# Patient Record
Sex: Female | Born: 1970 | Race: White | Hispanic: No | Marital: Married | Smoking: Current every day smoker
Health system: Southern US, Community
[De-identification: ages and names within clinical notes are randomized; demographics above are authoritative.]

## PROBLEM LIST (undated history)

## (undated) DIAGNOSIS — D649 Anemia, unspecified: Secondary | ICD-10-CM

## (undated) DIAGNOSIS — I2510 Atherosclerotic heart disease of native coronary artery without angina pectoris: Secondary | ICD-10-CM

## (undated) DIAGNOSIS — F419 Anxiety disorder, unspecified: Secondary | ICD-10-CM

## (undated) DIAGNOSIS — Z95810 Presence of automatic (implantable) cardiac defibrillator: Secondary | ICD-10-CM

## (undated) DIAGNOSIS — Z87442 Personal history of urinary calculi: Secondary | ICD-10-CM

## (undated) DIAGNOSIS — H269 Unspecified cataract: Secondary | ICD-10-CM

## (undated) DIAGNOSIS — I10 Essential (primary) hypertension: Secondary | ICD-10-CM

## (undated) DIAGNOSIS — M1990 Unspecified osteoarthritis, unspecified site: Secondary | ICD-10-CM

## (undated) DIAGNOSIS — R519 Headache, unspecified: Secondary | ICD-10-CM

## (undated) DIAGNOSIS — H35039 Hypertensive retinopathy, unspecified eye: Secondary | ICD-10-CM

## (undated) DIAGNOSIS — I509 Heart failure, unspecified: Secondary | ICD-10-CM

## (undated) DIAGNOSIS — E785 Hyperlipidemia, unspecified: Secondary | ICD-10-CM

## (undated) DIAGNOSIS — E11319 Type 2 diabetes mellitus with unspecified diabetic retinopathy without macular edema: Secondary | ICD-10-CM

## (undated) DIAGNOSIS — R55 Syncope and collapse: Secondary | ICD-10-CM

## (undated) DIAGNOSIS — J189 Pneumonia, unspecified organism: Secondary | ICD-10-CM

## (undated) DIAGNOSIS — H3323 Serous retinal detachment, bilateral: Secondary | ICD-10-CM

## (undated) DIAGNOSIS — Z720 Tobacco use: Secondary | ICD-10-CM

## (undated) DIAGNOSIS — I219 Acute myocardial infarction, unspecified: Secondary | ICD-10-CM

## (undated) DIAGNOSIS — E119 Type 2 diabetes mellitus without complications: Secondary | ICD-10-CM

## (undated) DIAGNOSIS — R42 Dizziness and giddiness: Secondary | ICD-10-CM

## (undated) HISTORY — DX: Syncope and collapse: R55

## (undated) HISTORY — DX: Unspecified cataract: H26.9

## (undated) HISTORY — DX: Dizziness and giddiness: R42

## (undated) HISTORY — PX: OTHER SURGICAL HISTORY: SHX169

## (undated) HISTORY — DX: Hypertensive retinopathy, unspecified eye: H35.039

## (undated) HISTORY — PX: TUBAL LIGATION: SHX77

## (undated) HISTORY — DX: Type 2 diabetes mellitus with unspecified diabetic retinopathy without macular edema: E11.319

## (undated) HISTORY — DX: Hyperlipidemia, unspecified: E78.5

## (undated) HISTORY — DX: Serous retinal detachment, bilateral: H33.23

---

## 2019-10-13 ENCOUNTER — Emergency Department (HOSPITAL_COMMUNITY): Payer: Self-pay

## 2019-10-13 ENCOUNTER — Encounter (HOSPITAL_COMMUNITY): Payer: Self-pay | Admitting: Emergency Medicine

## 2019-10-13 ENCOUNTER — Observation Stay (HOSPITAL_COMMUNITY): Payer: Self-pay

## 2019-10-13 ENCOUNTER — Inpatient Hospital Stay (HOSPITAL_COMMUNITY)
Admission: EM | Admit: 2019-10-13 | Discharge: 2019-10-15 | DRG: 287 | Disposition: A | Discharge status: Home / Self Care | Payer: Self-pay | Attending: Internal Medicine | Admitting: Internal Medicine

## 2019-10-13 ENCOUNTER — Other Ambulatory Visit: Payer: Self-pay

## 2019-10-13 DIAGNOSIS — I071 Rheumatic tricuspid insufficiency: Secondary | ICD-10-CM | POA: Diagnosis present

## 2019-10-13 DIAGNOSIS — I5021 Acute systolic (congestive) heart failure: Principal | ICD-10-CM | POA: Diagnosis present

## 2019-10-13 DIAGNOSIS — I255 Ischemic cardiomyopathy: Secondary | ICD-10-CM | POA: Diagnosis present

## 2019-10-13 DIAGNOSIS — I509 Heart failure, unspecified: Secondary | ICD-10-CM

## 2019-10-13 DIAGNOSIS — F419 Anxiety disorder, unspecified: Secondary | ICD-10-CM | POA: Diagnosis present

## 2019-10-13 DIAGNOSIS — I313 Pericardial effusion (noninflammatory): Secondary | ICD-10-CM | POA: Diagnosis present

## 2019-10-13 DIAGNOSIS — Z20822 Contact with and (suspected) exposure to covid-19: Secondary | ICD-10-CM | POA: Diagnosis present

## 2019-10-13 DIAGNOSIS — I252 Old myocardial infarction: Secondary | ICD-10-CM

## 2019-10-13 DIAGNOSIS — E669 Obesity, unspecified: Secondary | ICD-10-CM | POA: Diagnosis present

## 2019-10-13 DIAGNOSIS — I3139 Other pericardial effusion (noninflammatory): Secondary | ICD-10-CM | POA: Diagnosis present

## 2019-10-13 DIAGNOSIS — R0602 Shortness of breath: Secondary | ICD-10-CM

## 2019-10-13 DIAGNOSIS — E119 Type 2 diabetes mellitus without complications: Secondary | ICD-10-CM

## 2019-10-13 DIAGNOSIS — Z6836 Body mass index (BMI) 36.0-36.9, adult: Secondary | ICD-10-CM

## 2019-10-13 DIAGNOSIS — I428 Other cardiomyopathies: Secondary | ICD-10-CM | POA: Diagnosis present

## 2019-10-13 DIAGNOSIS — E875 Hyperkalemia: Secondary | ICD-10-CM | POA: Diagnosis present

## 2019-10-13 DIAGNOSIS — R188 Other ascites: Secondary | ICD-10-CM | POA: Diagnosis present

## 2019-10-13 DIAGNOSIS — I5082 Biventricular heart failure: Secondary | ICD-10-CM | POA: Diagnosis present

## 2019-10-13 DIAGNOSIS — I2510 Atherosclerotic heart disease of native coronary artery without angina pectoris: Secondary | ICD-10-CM | POA: Diagnosis present

## 2019-10-13 DIAGNOSIS — Z7984 Long term (current) use of oral hypoglycemic drugs: Secondary | ICD-10-CM

## 2019-10-13 DIAGNOSIS — Z8249 Family history of ischemic heart disease and other diseases of the circulatory system: Secondary | ICD-10-CM

## 2019-10-13 DIAGNOSIS — F17200 Nicotine dependence, unspecified, uncomplicated: Secondary | ICD-10-CM | POA: Diagnosis present

## 2019-10-13 DIAGNOSIS — I700 Atherosclerosis of aorta: Secondary | ICD-10-CM | POA: Diagnosis present

## 2019-10-13 DIAGNOSIS — R17 Unspecified jaundice: Secondary | ICD-10-CM | POA: Diagnosis present

## 2019-10-13 DIAGNOSIS — H539 Unspecified visual disturbance: Secondary | ICD-10-CM | POA: Diagnosis present

## 2019-10-13 DIAGNOSIS — R000 Tachycardia, unspecified: Secondary | ICD-10-CM | POA: Diagnosis present

## 2019-10-13 DIAGNOSIS — Q231 Congenital insufficiency of aortic valve: Secondary | ICD-10-CM

## 2019-10-13 DIAGNOSIS — F17210 Nicotine dependence, cigarettes, uncomplicated: Secondary | ICD-10-CM | POA: Diagnosis present

## 2019-10-13 DIAGNOSIS — E8770 Fluid overload, unspecified: Secondary | ICD-10-CM

## 2019-10-13 DIAGNOSIS — J40 Bronchitis, not specified as acute or chronic: Secondary | ICD-10-CM | POA: Diagnosis present

## 2019-10-13 DIAGNOSIS — R079 Chest pain, unspecified: Secondary | ICD-10-CM | POA: Diagnosis present

## 2019-10-13 HISTORY — DX: Tobacco use: Z72.0

## 2019-10-13 HISTORY — DX: Type 2 diabetes mellitus without complications: E11.9

## 2019-10-13 LAB — HEPATIC FUNCTION PANEL
ALT: 11 U/L (ref 0–44)
AST: 17 U/L (ref 15–41)
Albumin: 3.6 g/dL (ref 3.5–5.0)
Alkaline Phosphatase: 109 U/L (ref 38–126)
Bilirubin, Direct: 0.4 mg/dL — ABNORMAL HIGH (ref 0.0–0.2)
Indirect Bilirubin: 1.7 mg/dL — ABNORMAL HIGH (ref 0.3–0.9)
Total Bilirubin: 2.1 mg/dL — ABNORMAL HIGH (ref 0.3–1.2)
Total Protein: 6.1 g/dL — ABNORMAL LOW (ref 6.5–8.1)

## 2019-10-13 LAB — CBC
HCT: 45.6 % (ref 36.0–46.0)
Hemoglobin: 13 g/dL (ref 12.0–15.0)
MCH: 23.8 pg — ABNORMAL LOW (ref 26.0–34.0)
MCHC: 28.5 g/dL — ABNORMAL LOW (ref 30.0–36.0)
MCV: 83.5 fL (ref 80.0–100.0)
Platelets: 259 10*3/uL (ref 150–400)
RBC: 5.46 MIL/uL — ABNORMAL HIGH (ref 3.87–5.11)
RDW: 15.3 % (ref 11.5–15.5)
WBC: 8.3 10*3/uL (ref 4.0–10.5)
nRBC: 0 % (ref 0.0–0.2)

## 2019-10-13 LAB — BASIC METABOLIC PANEL
Anion gap: 11 (ref 5–15)
BUN: 6 mg/dL (ref 6–20)
CO2: 29 mmol/L (ref 22–32)
Calcium: 9 mg/dL (ref 8.9–10.3)
Chloride: 96 mmol/L — ABNORMAL LOW (ref 98–111)
Creatinine, Ser: 0.84 mg/dL (ref 0.44–1.00)
GFR calc Af Amer: >60 mL/min (ref >60)
GFR calc non Af Amer: >60 mL/min (ref >60)
Glucose, Bld: 209 mg/dL — ABNORMAL HIGH (ref 70–99)
Potassium: 3.5 mmol/L (ref 3.5–5.1)
Sodium: 136 mmol/L (ref 135–145)

## 2019-10-13 LAB — RESPIRATORY PANEL BY RT PCR (FLU A&B, COVID)
Influenza A by PCR: NEGATIVE
Influenza B by PCR: NEGATIVE
SARS Coronavirus 2 by RT PCR: NEGATIVE

## 2019-10-13 LAB — I-STAT BETA HCG BLOOD, ED (MC, WL, AP ONLY): I-stat hCG, quantitative: <5 m[IU]/mL (ref <5)

## 2019-10-13 LAB — BRAIN NATRIURETIC PEPTIDE: B Natriuretic Peptide: 1913.7 pg/mL — ABNORMAL HIGH (ref 0.0–100.0)

## 2019-10-13 LAB — TROPONIN I (HIGH SENSITIVITY)
Troponin I (High Sensitivity): 14 ng/L (ref <18)
Troponin I (High Sensitivity): 13 ng/L (ref <18)

## 2019-10-13 LAB — CBG MONITORING, ED: Glucose-Capillary: 141 mg/dL — ABNORMAL HIGH (ref 70–99)

## 2019-10-13 MED ORDER — SODIUM CHLORIDE 0.9 % IV SOLN: 250.0000 mL | INTRAVENOUS | Status: DC | PRN

## 2019-10-13 MED ORDER — FUROSEMIDE 10 MG/ML IJ SOLN
40.0000 mg | Freq: Two times a day (BID) | INTRAMUSCULAR | Status: DC
  Administered 2019-10-14: 40 mg via INTRAVENOUS
  Filled 2019-10-13: qty 4

## 2019-10-13 MED ORDER — INSULIN ASPART 100 UNIT/ML ~~LOC~~ SOLN: 0.0000 [IU] | Freq: Every day | SUBCUTANEOUS | Status: DC

## 2019-10-13 MED ORDER — ENOXAPARIN SODIUM 40 MG/0.4ML ~~LOC~~ SOLN
40.0000 mg | SUBCUTANEOUS | Status: DC
  Administered 2019-10-13: 40 mg via SUBCUTANEOUS
  Filled 2019-10-13: qty 0.4

## 2019-10-13 MED ORDER — INSULIN ASPART 100 UNIT/ML ~~LOC~~ SOLN
0.0000 [IU] | Freq: Three times a day (TID) | SUBCUTANEOUS | Status: DC
  Administered 2019-10-14 – 2019-10-15 (×3): 1 [IU] via SUBCUTANEOUS
  Administered 2019-10-15: 2 [IU] via SUBCUTANEOUS

## 2019-10-13 MED ORDER — ONDANSETRON HCL 4 MG/2ML IJ SOLN: 4.0000 mg | Freq: Four times a day (QID) | INTRAMUSCULAR | Status: DC | PRN

## 2019-10-13 MED ORDER — FUROSEMIDE 10 MG/ML IJ SOLN
40.0000 mg | Freq: Once | INTRAMUSCULAR | Status: AC
  Administered 2019-10-13: 40 mg via INTRAVENOUS
  Filled 2019-10-13: qty 4

## 2019-10-13 MED ORDER — ACETAMINOPHEN 325 MG PO TABS: 650.0000 mg | ORAL_TABLET | ORAL | Status: DC | PRN

## 2019-10-13 MED ORDER — SODIUM CHLORIDE 0.9% FLUSH: 3.0000 mL | INTRAVENOUS | Status: DC | PRN

## 2019-10-13 MED ORDER — SODIUM CHLORIDE 0.9% FLUSH
3.0000 mL | Freq: Two times a day (BID) | INTRAVENOUS | Status: DC
  Administered 2019-10-14: 3 mL via INTRAVENOUS

## 2019-10-13 MED ORDER — SODIUM CHLORIDE 0.9% FLUSH: 3.0000 mL | Freq: Once | INTRAVENOUS | Status: DC

## 2019-10-13 MED ORDER — IOHEXOL 350 MG/ML SOLN
100.0000 mL | Freq: Once | INTRAVENOUS | Status: AC | PRN
  Administered 2019-10-13: 100 mL via INTRAVENOUS

## 2019-10-13 NOTE — ED Provider Notes (Signed)
[facility] EMERGENCY DEPARTMENT Provider Note   CSN: [id number] Arrival date & time: 10/13/19  1512     History Chief Complaint  Patient presents with   Chest Pain   Shortness of Breath    Zanaria Morell is a 49 y.o. female.  The history is provided by the patient.  Shortness of Breath Severity:  Moderate Onset quality:  Gradual Timing:  Constant Progression:  Worsening Chronicity:  New Context: activity   Relieved by:  Nothing Worsened by:  Exertion Associated symptoms: chest pain and cough   Associated symptoms: no abdominal pain, no claudication, no diaphoresis, no ear pain, no fever, no headaches, no hemoptysis, no PND, no rash, no sore throat, no sputum production, no syncope, no swollen glands and no vomiting   Risk factors: no hx of PE/DVT        Past Medical History:  Diagnosis Date   Diabetes mellitus without complication (HCC)     There are no problems to display for this patient.   History reviewed. No pertinent surgical history.   OB History   No obstetric history on file.     No family history on file.  Social History   Tobacco Use   Smoking status: Current Every Day Smoker   Smokeless tobacco: Never Used  Substance Use Topics   Alcohol use: Yes   Drug use: Never    Home Medications Prior to Admission medications   Medication Sig Start Date End Date Taking? Authorizing Provider  metFORMIN (GLUCOPHAGE) 1000 MG tablet Take 1 tablet by mouth in the morning and at bedtime. 09/13/19  Yes [provider]    Allergies    Patient has no known allergies.  Review of Systems   Review of Systems  Constitutional: Negative for chills, diaphoresis and fever.  HENT: Negative for ear pain and sore throat.   Eyes: Negative for pain and visual disturbance.  Respiratory: Positive for cough and shortness of breath. Negative for hemoptysis and sputum production.   Cardiovascular: Positive for chest pain and leg  swelling. Negative for palpitations, claudication, syncope and PND.  Gastrointestinal: Negative for abdominal pain and vomiting.  Genitourinary: Negative for dysuria and hematuria.  Musculoskeletal: Negative for arthralgias and back pain.  Skin: Negative for color change and rash.  Neurological: Negative for seizures, syncope and headaches.  All other systems reviewed and are negative.   Physical Exam Updated Vital Signs  ED Triage Vitals  Enc Vitals Group     BP 10/13/19 1517 (!) 132/94     Pulse Rate 10/13/19 1517 (!) 102     Resp 10/13/19 1517 18     Temp 10/13/19 1517 97.7 F (36.5 C)     Temp Source 10/13/19 1517 Oral     SpO2 10/13/19 1517 100 %     Weight --      Height --      Head Circumference --      Peak Flow --      Pain Score 10/13/19 1523 4     Pain Loc --      Pain Edu? --      Excl. in GC? --     Physical Exam Vitals and nursing note reviewed.  Constitutional:      General: She is not in acute distress.    Appearance: She is well-developed. She is not ill-appearing.  HENT:     Head: Normocephalic and atraumatic.  Eyes:     Conjunctiva/sclera: Conjunctivae normal.  Pupils: Pupils are equal, round, and reactive to light.  Cardiovascular:     Rate and Rhythm: Normal rate and regular rhythm.     Pulses:          Radial pulses are 2+ on the right side and 2+ on the left side.     Heart sounds: Normal heart sounds. No murmur.  Pulmonary:     Effort: Pulmonary effort is normal. No respiratory distress.     Breath sounds: Decreased breath sounds present.  Abdominal:     Palpations: Abdomen is soft.     Tenderness: There is no abdominal tenderness.  Musculoskeletal:        General: Normal range of motion.     Cervical back: Normal range of motion and neck supple.     Right lower leg: Edema (3+ pitting) present.     Left lower leg: Edema (3+ pitting) present.  Skin:    General: Skin is warm and dry.     Capillary Refill: Capillary refill takes less  than 2 seconds.  Neurological:     General: No focal deficit present.     Mental Status: She is alert.  Psychiatric:        Mood and Affect: Mood normal.     ED Results / Procedures / Treatments   Labs (all labs ordered are listed, but only abnormal results are displayed) Labs Reviewed  BASIC METABOLIC PANEL - Abnormal; Notable for the following components:      Result Value   Chloride 96 (*)    Glucose, Bld 209 (*)    All other components within normal limits  CBC - Abnormal; Notable for the following components:   RBC 5.46 (*)    MCH 23.8 (*)    MCHC 28.5 (*)    All other components within normal limits  BRAIN NATRIURETIC PEPTIDE - Abnormal; Notable for the following components:   B Natriuretic Peptide 1,913.7 (*)    All other components within normal limits  HEPATIC FUNCTION PANEL - Abnormal; Notable for the following components:   Total Protein 6.1 (*)    Total Bilirubin 2.1 (*)    Bilirubin, Direct 0.4 (*)    Indirect Bilirubin 1.7 (*)    All other components within normal limits  RESPIRATORY PANEL BY RT PCR (FLU A&B, COVID)  I-STAT BETA HCG BLOOD, ED (MC, [facility], AP ONLY)  TROPONIN I (HIGH SENSITIVITY)  TROPONIN I (HIGH SENSITIVITY)    EKG EKG Interpretation  Date/Time:  Thursday Oct 13 2019 15:15:23 EDT Ventricular Rate:  101 PR Interval:  184 QRS Duration: 86 QT Interval:  330 QTC Calculation: 427 R Axis:   119 Text Interpretation: Sinus tachycardia Confirmed by Lennice Sites [phone]) on 10/13/2019 7:50:22 PM   Radiology DG Chest 2 View  Result Date: 10/13/2019 CLINICAL DATA:  Chest pain EXAM: CHEST - 2 VIEW COMPARISON:  08/07/2016 FINDINGS: Heart size is upper limits of normal. Mild interstitial prominence throughout both lungs without focal airspace consolidation, pleural effusion, or pneumothorax. The visualized skeletal structures are unremarkable. IMPRESSION: 1. Mild interstitial prominence throughout both lungs which may reflect edema versus atypical  infection. No focal airspace consolidation. 2. Heart size upper limits of normal. Electronically Signed   By: Davina Poke D.O.   On: 10/13/2019 15:40   CT Angio Chest PE W and/or Wo Contrast  Result Date: 10/13/2019 CLINICAL DATA:  Shortness of breath EXAM: CT ANGIOGRAPHY CHEST WITH CONTRAST TECHNIQUE: Multidetector CT imaging of the chest was performed using the standard protocol  during bolus administration of intravenous contrast. Multiplanar CT image reconstructions and MIPs were obtained to evaluate the vascular anatomy. CONTRAST:  OMNIPAQUE IOHEXOL 350 MG/ML SOLN COMPARISON:  03/03/2010 FINDINGS: Cardiovascular: Satisfactory opacification of the pulmonary arteries to the segmental level. No evidence of pulmonary embolism. Cardiomegaly. Scattered coronary artery calcifications. Trace pericardial effusion. Aortic atherosclerosis. Mediastinum/Nodes: Prominent nonspecific mediastinal and hilar lymph nodes. Thyroid gland, trachea, and esophagus demonstrate no significant findings. Lungs/Pleura: Diffuse bilateral bronchial wall thickening. No pleural effusion or pneumothorax. Upper Abdomen: No acute abnormality. Small volume ascites in the included upper abdomen. Musculoskeletal: No chest wall abnormality. No acute or significant osseous findings. Review of the MIP images confirms the above findings. IMPRESSION: 1. Negative examination for pulmonary embolism. 2. Diffuse bilateral bronchial wall thickening, consistent with nonspecific infectious or inflammatory bronchitis. 3. Prominent nonspecific mediastinal and hilar lymph nodes, likely reactive. 4. Cardiomegaly and trace pericardial effusion. 5. Scattered coronary artery calcifications. 6. Small volume ascites in the included upper abdomen. 7. Aortic Atherosclerosis (ICD10-I70.0). Electronically Signed   By: Lauralyn Primes M.D.   On: 10/13/2019 20:50    Procedures Procedures (including critical care time)  Medications Ordered in ED Medications    sodium chloride flush (NS) 0.9 % injection 3 mL (has no administration in time range)  iohexol (OMNIPAQUE) 350 MG/ML injection 100 mL (100 mLs Intravenous Contrast Given 10/13/19 2037)  furosemide (LASIX) injection 40 mg (40 mg Intravenous Given 10/13/19 2111)    ED Course  I have reviewed the triage vital signs and the nursing notes.  Pertinent labs & imaging results that were available during my care of the patient were reviewed by me and considered in my medical decision making (see chart for details).    MDM Rules/Calculators/A&P                      Daleyza Gadomski is a 49 year old female with history of diabetes who presents to the ED with shortness of breath, chest pain, leg swelling.  Patient with symptoms over the last several days to weeks.  Has noticed weight gain.  Shortness of breath when walking, shortness of breath with lying flat.  No history of heart failure.  No current chest pain.  Patient has 3+ pitting edema bilaterally in her legs.  X-ray appears likely more consistent with edema than infection.  Troponin negative x2.  No significant anemia, electrolyte abnormality, kidney injury.  Given her shortness of breath and chest pain we will get a CT scan to further evaluate for PE versus infection versus edema.  Will add BNP, hepatic function panel.  Anticipate admission for echocardiogram and diuresis as mostly concerned about new heart failure.  BNP is almost 2000.  CT of the chest negative for PE.  Patient does have diffuse bilateral bronchial wall thickening possibly inflammatory.  Patient does have cardiomegaly and trace pericardial effusion.  Does have scattered coronary artery calcifications.  Overall believe symptoms are secondary to new heart failure.  Seems less likely that she has an acute infectious process.  Likely has COPD.  Patient given IV Lasix and will admit for further diuresis and formal echocardiogram.  She may need ongoing ischemic work-up as well.  This chart was  dictated using voice recognition software.  Despite best efforts to proofread,  errors can occur which can change the documentation meaning.    Final Clinical Impression(s) / ED Diagnoses Final diagnoses:  SOB (shortness of breath)  Acute heart failure, unspecified heart failure type (HCC)  Hypervolemia, unspecified  hypervolemia type    Rx / DC Orders ED Discharge Orders    None       Virgina Norfolk, DO 10/13/19 2113

## 2019-10-13 NOTE — ED Triage Notes (Signed)
Pt c/o chest pain, described as squeezing, pain to the left shoulder and shortness of breath. Pt reports symptoms have been intermittent over a few days, but became constant today. Also c/o lower extremity swelling.

## 2019-10-13 NOTE — H&P (Addendum)
History and Physical    Shelly [name] KGY:[id number] DOB: [birth year] DOA: 10/13/2019  PCP: [facility]   Patient coming from: Home   Chief Complaint: Chest tightness, SOB, swelling   HPI: Shelly [name] is a 49 y.o. female with medical history significant for type 2 diabetes mellitus and tobacco abuse, now presenting to emergency department with shortness of breath, chest tightness, and swelling.  Patient began to notice that she was gaining weight roughly a month ago and over the past couple weeks has developed new bilateral lower extremity edema and progressive exertional dyspnea.  For the past several days, she has had orthopnea and intermittent chest tightness.  Dyspnea and chest discomfort became more constant today, prompting her presentation to the ED.  She has never had this before, denies fevers or chills, and denies cough.  She denies any alcohol use or any significant acetaminophen use.  Denies abdominal pain, vomiting, or diarrhea.  ED Course: Upon arrival to the ED, patient is found to be afebrile, saturating well on room air, slightly tachycardic, and with stable blood pressure.  EKG features sinus tachycardia with rate 101.  CTA chest is negative for PE but notable for diffuse bilateral bronchial wall thickening, cardiomegaly, and trace pericardial effusion.  Also noted on the CTA is small volume ascites in the upper abdomen.  Chemistry panel features a glucose of 209 and bilirubin 2.1.  CBC is unremarkable.  BNP is elevated to 1914 and high-sensitivity troponin is normal x2.  COVID-19 screening test is pending.  Patient was given 40 mg IV Lasix in the ED, is beginning to diurese, and hospitalist consulted for admission.  Review of Systems:  All other systems reviewed and apart from HPI, are negative.  Past Medical History:  Diagnosis Date  . Diabetes mellitus without complication (HCC)     History reviewed. No pertinent surgical history.   reports that she has  been smoking. She has never used smokeless tobacco. She reports current alcohol use. She reports that she does not use drugs.  No Known Allergies  History reviewed. No pertinent family history.   Prior to Admission medications   Medication Sig Start Date End Date Taking? Authorizing Provider  metFORMIN (GLUCOPHAGE) 1000 MG tablet Take 1 tablet by mouth in the morning and at bedtime. 09/13/19  Yes [provider]    Physical Exam: Vitals:   10/13/19 2015 10/13/19 2155 10/13/19 2200 10/13/19 2217  BP: 137/90 134/87 126/88   Pulse: 92 94 95   Resp: (!) 27 17 (!) 22   Temp:      TempSrc:      SpO2: 99% 99% 93%   Weight:    108 kg  Height:    5\' 8"  (1.727 m)    Constitutional: NAD, calm  Eyes: PERTLA, lids and conjunctivae normal ENMT: Mucous membranes are moist. Posterior pharynx clear of any exudate or lesions.   Neck: normal, supple, no masses, no thyromegaly Respiratory:  no wheezing, no crackles. No accessory muscle use.  Cardiovascular: S1 & S2 heard, regular rate and rhythm. Pitting pretibial edema bilaterally. Abdomen: No distension, no tenderness, soft. Bowel sounds active.  Musculoskeletal: no clubbing / cyanosis. No joint deformity upper and lower extremities.   Skin: no significant rashes, lesions, ulcers. Warm, dry, well-perfused. Neurologic: CN 2-12 grossly intact. Sensation intact. Moving all extremities.  Psychiatric: Alert and oriented to person, place, and situation. Pleasant and cooperative.    Labs and Imaging on Admission: I have personally reviewed following labs and  imaging studies  CBC: Recent Labs  Lab 10/13/19 1527  WBC 8.3  HGB 13.0  HCT 45.6  MCV 83.5  PLT 259   Basic Metabolic Panel: Recent Labs  Lab 10/13/19 1527  NA 136  K 3.5  CL 96*  CO2 29  GLUCOSE 209*  BUN 6  CREATININE 0.84  CALCIUM 9.0   GFR: Estimated Creatinine Clearance: 103.1 mL/min (by C-G formula based on SCr of 0.84 mg/dL). Liver Function Tests: Recent  Labs  Lab 10/13/19 1527  AST 17  ALT 11  ALKPHOS 109  BILITOT 2.1*  PROT 6.1*  ALBUMIN 3.6   No results for input(s): LIPASE, AMYLASE in the last 168 hours. No results for input(s): AMMONIA in the last 168 hours. Coagulation Profile: No results for input(s): INR, PROTIME in the last 168 hours. Cardiac Enzymes: No results for input(s): CKTOTAL, CKMB, CKMBINDEX, TROPONINI in the last 168 hours. BNP (last 3 results) No results for input(s): PROBNP in the last 8760 hours. HbA1C: No results for input(s): HGBA1C in the last 72 hours. CBG: Recent Labs  Lab 10/13/19 2151  GLUCAP 141*   Lipid Profile: No results for input(s): CHOL, HDL, LDLCALC, TRIG, CHOLHDL, LDLDIRECT in the last 72 hours. Thyroid Function Tests: No results for input(s): TSH, T4TOTAL, FREET4, T3FREE, THYROIDAB in the last 72 hours. Anemia Panel: No results for input(s): VITAMINB12, FOLATE, FERRITIN, TIBC, IRON, RETICCTPCT in the last 72 hours. Urine analysis: No results found for: COLORURINE, APPEARANCEUR, LABSPEC, PHURINE, GLUCOSEU, HGBUR, BILIRUBINUR, KETONESUR, PROTEINUR, UROBILINOGEN, NITRITE, LEUKOCYTESUR Sepsis Labs: @LABRCNTIP (procalcitonin:4,lacticidven:4) ) Recent Results (from the past 240 hour(s))  Respiratory Panel by RT PCR (Flu A&B, Covid) - Nasopharyngeal Swab     Status: None   Collection Time: 10/13/19  8:09 PM   Specimen: Nasopharyngeal Swab  Result Value Ref Range Status   SARS Coronavirus 2 by RT PCR NEGATIVE NEGATIVE Final    Comment: (NOTE) SARS-CoV-2 target nucleic acids are NOT DETECTED. The SARS-CoV-2 RNA is generally detectable in upper respiratoy specimens during the acute phase of infection. The lowest concentration of SARS-CoV-2 viral copies this assay can detect is 131 copies/mL. A negative result does not preclude SARS-Cov-2 infection and should not be used as the sole basis for treatment or other patient management decisions. A negative result may occur with  improper  specimen collection/handling, submission of specimen other than nasopharyngeal swab, presence of viral mutation(s) within the areas targeted by this assay, and inadequate number of viral copies (<131 copies/mL). A negative result must be combined with clinical observations, patient history, and epidemiological information. The expected result is Negative. Fact Sheet for Patients:  12/13/19 Fact Sheet for Healthcare Providers:  https://www.moore.com/ This test is not yet ap proved or cleared by the https://www.young.biz/ FDA and  has been authorized for detection and/or diagnosis of SARS-CoV-2 by FDA under an Emergency Use Authorization (EUA). This EUA will remain  in effect (meaning this test can be used) for the duration of the COVID-19 declaration under Section 564(b)(1) of the Act, 21 U.S.C. section 360bbb-3(b)(1), unless the authorization is terminated or revoked sooner.    Influenza A by PCR NEGATIVE NEGATIVE Final   Influenza B by PCR NEGATIVE NEGATIVE Final    Comment: (NOTE) The Xpert Xpress SARS-CoV-2/FLU/RSV assay is intended as an aid in  the diagnosis of influenza from Nasopharyngeal swab specimens and  should not be used as a sole basis for treatment. Nasal washings and  aspirates are unacceptable for Xpert Xpress SARS-CoV-2/FLU/RSV  testing. Fact Sheet for Patients:  https://www.moore.com/ Fact Sheet for Healthcare Providers: https://www.young.biz/ This test is not yet approved or cleared by the [place] FDA and  has been authorized for detection and/or diagnosis of SARS-CoV-2 by  FDA under an Emergency Use Authorization (EUA). This EUA will remain  in effect (meaning this test can be used) for the duration of the  Covid-19 declaration under Section 564(b)(1) of the Act, 21  U.S.C. section 360bbb-3(b)(1), unless the authorization is  terminated or revoked. Performed at [facility], [address], [place], [place] [address]      Radiological Exams on Admission: DG Chest 2 View  Result Date: 10/13/2019 CLINICAL DATA:  Chest pain EXAM: CHEST - 2 VIEW COMPARISON:  08/07/2016 FINDINGS: Heart size is upper limits of normal. Mild interstitial prominence throughout both lungs without focal airspace consolidation, pleural effusion, or pneumothorax. The visualized skeletal structures are unremarkable. IMPRESSION: 1. Mild interstitial prominence throughout both lungs which may reflect edema versus atypical infection. No focal airspace consolidation. 2. Heart size upper limits of normal. Electronically Signed   By: Duanne Guess D.O.   On: 10/13/2019 15:40   CT Angio Chest PE W and/or Wo Contrast  Result Date: 10/13/2019 CLINICAL DATA:  Shortness of breath EXAM: CT ANGIOGRAPHY CHEST WITH CONTRAST TECHNIQUE: Multidetector CT imaging of the chest was performed using the standard protocol during bolus administration of intravenous contrast. Multiplanar CT image reconstructions and MIPs were obtained to evaluate the vascular anatomy. CONTRAST:  OMNIPAQUE IOHEXOL 350 MG/ML SOLN COMPARISON:  03/03/2010 FINDINGS: Cardiovascular: Satisfactory opacification of the pulmonary arteries to the segmental level. No evidence of pulmonary embolism. Cardiomegaly. Scattered coronary artery calcifications. Trace pericardial effusion. Aortic atherosclerosis. Mediastinum/Nodes: Prominent nonspecific mediastinal and hilar lymph nodes. Thyroid gland, trachea, and esophagus demonstrate no significant findings. Lungs/Pleura: Diffuse bilateral bronchial wall thickening. No pleural effusion or pneumothorax. Upper Abdomen: No acute abnormality. Small volume ascites in the included upper abdomen. Musculoskeletal: No chest wall abnormality. No acute or significant osseous findings. Review of the MIP images confirms the above findings. IMPRESSION: 1. Negative examination for pulmonary embolism. 2. Diffuse  bilateral bronchial wall thickening, consistent with nonspecific infectious or inflammatory bronchitis. 3. Prominent nonspecific mediastinal and hilar lymph nodes, likely reactive. 4. Cardiomegaly and trace pericardial effusion. 5. Scattered coronary artery calcifications. 6. Small volume ascites in the included upper abdomen. 7. Aortic Atherosclerosis (ICD10-I70.0). Electronically Signed   By: Lauralyn Primes M.D.   On: 10/13/2019 20:50    EKG: Independently reviewed. Sinus tachycardia, rate 101.   Assessment/Plan   1. Acute CHF  - Presents with progressive bilateral leg swelling, DOE, orthopnea, and chest discomfort and is found to have BNP 1914  - She was given Lasix 40 mg IV in ED and is beginning to diurese  - Continue diuresis with Lasix 40 mg IV q12h, check echocardiogram, monitor renal function and electrolytes with diuresis   2. Chest pain  - PE ruled-out with CTA and HS troponin normal x2  - Likely related to #1, continue cardiac monitoring and check echocardiogram    3. Hyperbilirubinemia; ascites  - Total bilirubin is 2.1 and abdominal ascites noted incidentally on CTA chest in ED  - Abdominal exam benign; she does not consume alcohol and denies significant APAP use  - Check abdominal US   4. Type II DM  - A1c was 9.2% in April 2021  - Hold metformin and use low-intensity SSI for now   5. Current smoker  - She is actively cutting back with goal of  quitting, was encouraged to continue these efforts    DVT prophylaxis: Lovenox  Code Status: Full  Family Communication: Discussed with patient  Disposition Plan:  Patient is from: Home  Anticipated d/c is to: Home  Anticipated d/c date is: 10/14/19 Patient currently: Pending abdominal US, echocardiogram, and improved respiratory status with diuresis  Consults called: None  Admission status: Observation     Vianne Bulls, MD [facility] Pager: See [email or web address]  If 7AM-7PM, please contact the daytime  attending [email or web address]  10/13/2019, 10:19 PM

## 2019-10-14 ENCOUNTER — Observation Stay (HOSPITAL_COMMUNITY): Payer: Self-pay

## 2019-10-14 ENCOUNTER — Encounter (HOSPITAL_COMMUNITY): Payer: Self-pay | Admitting: Internal Medicine

## 2019-10-14 ENCOUNTER — Encounter (HOSPITAL_COMMUNITY)
Admission: EM | Disposition: A | Discharge status: Home / Self Care | Payer: Self-pay | Source: Home / Self Care | Attending: Internal Medicine

## 2019-10-14 DIAGNOSIS — I3139 Other pericardial effusion (noninflammatory): Secondary | ICD-10-CM | POA: Diagnosis present

## 2019-10-14 DIAGNOSIS — E669 Obesity, unspecified: Secondary | ICD-10-CM | POA: Diagnosis present

## 2019-10-14 DIAGNOSIS — I313 Pericardial effusion (noninflammatory): Secondary | ICD-10-CM | POA: Diagnosis present

## 2019-10-14 DIAGNOSIS — E119 Type 2 diabetes mellitus without complications: Secondary | ICD-10-CM

## 2019-10-14 DIAGNOSIS — I340 Nonrheumatic mitral (valve) insufficiency: Secondary | ICD-10-CM

## 2019-10-14 DIAGNOSIS — I2510 Atherosclerotic heart disease of native coronary artery without angina pectoris: Secondary | ICD-10-CM

## 2019-10-14 DIAGNOSIS — F17200 Nicotine dependence, unspecified, uncomplicated: Secondary | ICD-10-CM

## 2019-10-14 DIAGNOSIS — R079 Chest pain, unspecified: Secondary | ICD-10-CM

## 2019-10-14 DIAGNOSIS — I361 Nonrheumatic tricuspid (valve) insufficiency: Secondary | ICD-10-CM

## 2019-10-14 DIAGNOSIS — Z6835 Body mass index (BMI) 35.0-35.9, adult: Secondary | ICD-10-CM

## 2019-10-14 DIAGNOSIS — I5021 Acute systolic (congestive) heart failure: Principal | ICD-10-CM

## 2019-10-14 DIAGNOSIS — R188 Other ascites: Secondary | ICD-10-CM | POA: Diagnosis present

## 2019-10-14 DIAGNOSIS — I509 Heart failure, unspecified: Secondary | ICD-10-CM

## 2019-10-14 HISTORY — PX: RIGHT/LEFT HEART CATH AND CORONARY ANGIOGRAPHY: CATH118266

## 2019-10-14 LAB — POCT I-STAT EG7
Acid-Base Excess: 8 mmol/L — ABNORMAL HIGH (ref 0.0–2.0)
Acid-Base Excess: 9 mmol/L — ABNORMAL HIGH (ref 0.0–2.0)
Bicarbonate: 33.7 mmol/L — ABNORMAL HIGH (ref 20.0–28.0)
Bicarbonate: 35.3 mmol/L — ABNORMAL HIGH (ref 20.0–28.0)
Calcium, Ion: 1.03 mmol/L — ABNORMAL LOW (ref 1.15–1.40)
Calcium, Ion: 1.13 mmol/L — ABNORMAL LOW (ref 1.15–1.40)
HCT: 40 % (ref 36.0–46.0)
HCT: 42 % (ref 36.0–46.0)
Hemoglobin: 13.6 g/dL (ref 12.0–15.0)
Hemoglobin: 14.3 g/dL (ref 12.0–15.0)
O2 Saturation: 61 %
O2 Saturation: 62 %
Potassium: 2.9 mmol/L — ABNORMAL LOW (ref 3.5–5.1)
Potassium: 3.1 mmol/L — ABNORMAL LOW (ref 3.5–5.1)
Sodium: 141 mmol/L (ref 135–145)
Sodium: 143 mmol/L (ref 135–145)
TCO2: 35 mmol/L — ABNORMAL HIGH (ref 22–32)
TCO2: 37 mmol/L — ABNORMAL HIGH (ref 22–32)
pCO2, Ven: 50.6 mmHg (ref 44.0–60.0)
pCO2, Ven: 51.9 mmHg (ref 44.0–60.0)
pH, Ven: 7.432 — ABNORMAL HIGH (ref 7.250–7.430)
pH, Ven: 7.441 — ABNORMAL HIGH (ref 7.250–7.430)
pO2, Ven: 31 mmHg — CL (ref 32.0–45.0)
pO2, Ven: 32 mmHg (ref 32.0–45.0)

## 2019-10-14 LAB — COMPREHENSIVE METABOLIC PANEL
ALT: 11 U/L (ref 0–44)
AST: 14 U/L — ABNORMAL LOW (ref 15–41)
Albumin: 3.4 g/dL — ABNORMAL LOW (ref 3.5–5.0)
Alkaline Phosphatase: 105 U/L (ref 38–126)
Anion gap: 15 (ref 5–15)
BUN: 6 mg/dL (ref 6–20)
CO2: 29 mmol/L (ref 22–32)
Calcium: 9.3 mg/dL (ref 8.9–10.3)
Chloride: 99 mmol/L (ref 98–111)
Creatinine, Ser: 0.82 mg/dL (ref 0.44–1.00)
GFR calc Af Amer: >60 mL/min (ref >60)
GFR calc non Af Amer: >60 mL/min (ref >60)
Glucose, Bld: 153 mg/dL — ABNORMAL HIGH (ref 70–99)
Potassium: 3.7 mmol/L (ref 3.5–5.1)
Sodium: 143 mmol/L (ref 135–145)
Total Bilirubin: 2.6 mg/dL — ABNORMAL HIGH (ref 0.3–1.2)
Total Protein: 6.1 g/dL — ABNORMAL LOW (ref 6.5–8.1)

## 2019-10-14 LAB — POCT I-STAT 7, (LYTES, BLD GAS, ICA,H+H)
Acid-Base Excess: 5 mmol/L — ABNORMAL HIGH (ref 0.0–2.0)
Bicarbonate: 28.1 mmol/L — ABNORMAL HIGH (ref 20.0–28.0)
Calcium, Ion: 0.78 mmol/L — CL (ref 1.15–1.40)
HCT: 35 % — ABNORMAL LOW (ref 36.0–46.0)
Hemoglobin: 11.9 g/dL — ABNORMAL LOW (ref 12.0–15.0)
O2 Saturation: 99 %
Potassium: 2.4 mmol/L — CL (ref 3.5–5.1)
Sodium: 148 mmol/L — ABNORMAL HIGH (ref 135–145)
TCO2: 29 mmol/L (ref 22–32)
pCO2 arterial: 36.1 mmHg (ref 32.0–48.0)
pH, Arterial: 7.499 — ABNORMAL HIGH (ref 7.350–7.450)
pO2, Arterial: 141 mmHg — ABNORMAL HIGH (ref 83.0–108.0)

## 2019-10-14 LAB — CBC WITH DIFFERENTIAL/PLATELET
Abs Immature Granulocytes: 0.02 10*3/uL (ref 0.00–0.07)
Basophils Absolute: 0.1 10*3/uL (ref 0.0–0.1)
Basophils Relative: 1 %
Eosinophils Absolute: 0.2 10*3/uL (ref 0.0–0.5)
Eosinophils Relative: 2 %
HCT: 44.5 % (ref 36.0–46.0)
Hemoglobin: 13 g/dL (ref 12.0–15.0)
Immature Granulocytes: 0 %
Lymphocytes Relative: 30 %
Lymphs Abs: 2.2 10*3/uL (ref 0.7–4.0)
MCH: 24.2 pg — ABNORMAL LOW (ref 26.0–34.0)
MCHC: 29.2 g/dL — ABNORMAL LOW (ref 30.0–36.0)
MCV: 82.7 fL (ref 80.0–100.0)
Monocytes Absolute: 0.5 10*3/uL (ref 0.1–1.0)
Monocytes Relative: 7 %
Neutro Abs: 4.5 10*3/uL (ref 1.7–7.7)
Neutrophils Relative %: 60 %
Platelets: 242 10*3/uL (ref 150–400)
RBC: 5.38 MIL/uL — ABNORMAL HIGH (ref 3.87–5.11)
RDW: 15.4 % (ref 11.5–15.5)
WBC: 7.5 10*3/uL (ref 4.0–10.5)
nRBC: 0 % (ref 0.0–0.2)

## 2019-10-14 LAB — GLUCOSE, CAPILLARY
Glucose-Capillary: 149 mg/dL — ABNORMAL HIGH (ref 70–99)
Glucose-Capillary: 121 mg/dL — ABNORMAL HIGH (ref 70–99)
Glucose-Capillary: 137 mg/dL — ABNORMAL HIGH (ref 70–99)
Glucose-Capillary: 142 mg/dL — ABNORMAL HIGH (ref 70–99)

## 2019-10-14 LAB — CBC
HCT: 42.6 % (ref 36.0–46.0)
Hemoglobin: 12.6 g/dL (ref 12.0–15.0)
MCH: 24.4 pg — ABNORMAL LOW (ref 26.0–34.0)
MCHC: 29.6 g/dL — ABNORMAL LOW (ref 30.0–36.0)
MCV: 82.6 fL (ref 80.0–100.0)
Platelets: 251 10*3/uL (ref 150–400)
RBC: 5.16 MIL/uL — ABNORMAL HIGH (ref 3.87–5.11)
RDW: 15.2 % (ref 11.5–15.5)
WBC: 7.8 10*3/uL (ref 4.0–10.5)
nRBC: 0 % (ref 0.0–0.2)

## 2019-10-14 LAB — CREATININE, SERUM
Creatinine, Ser: 0.92 mg/dL (ref 0.44–1.00)
GFR calc Af Amer: >60 mL/min (ref >60)
GFR calc non Af Amer: >60 mL/min (ref >60)

## 2019-10-14 LAB — ECHOCARDIOGRAM COMPLETE
Height: 68 in
Weight: 3752 oz

## 2019-10-14 LAB — LIPID PANEL
Cholesterol: 153 mg/dL (ref 0–200)
HDL: 26 mg/dL — ABNORMAL LOW (ref >40)
LDL Cholesterol: 109 mg/dL — ABNORMAL HIGH (ref 0–99)
Total CHOL/HDL Ratio: 5.9 RATIO
Triglycerides: 91 mg/dL (ref <150)
VLDL: 18 mg/dL (ref 0–40)

## 2019-10-14 LAB — HIV ANTIBODY (ROUTINE TESTING W REFLEX): HIV Screen 4th Generation wRfx: NONREACTIVE

## 2019-10-14 SURGERY — RIGHT/LEFT HEART CATH AND CORONARY ANGIOGRAPHY
Anesthesia: LOCAL

## 2019-10-14 MED ORDER — ALPRAZOLAM 0.5 MG PO TABS: 0.5000 mg | ORAL_TABLET | Freq: Once | ORAL | Status: DC

## 2019-10-14 MED ORDER — MIDAZOLAM HCL 2 MG/2ML IJ SOLN
INTRAMUSCULAR | Status: AC
  Filled 2019-10-14: qty 2

## 2019-10-14 MED ORDER — ASPIRIN 81 MG PO CHEW: 81.0000 mg | CHEWABLE_TABLET | ORAL | Status: DC

## 2019-10-14 MED ORDER — DIGOXIN 125 MCG PO TABS
0.1250 mg | ORAL_TABLET | Freq: Every day | ORAL | Status: DC
  Administered 2019-10-14 – 2019-10-15 (×2): 0.125 mg via ORAL
  Filled 2019-10-14 (×2): qty 1

## 2019-10-14 MED ORDER — VERAPAMIL HCL 2.5 MG/ML IV SOLN
INTRAVENOUS | Status: AC
  Filled 2019-10-14: qty 2

## 2019-10-14 MED ORDER — FENTANYL CITRATE (PF) 100 MCG/2ML IJ SOLN
INTRAMUSCULAR | Status: DC | PRN
  Administered 2019-10-14: 25 ug via INTRAVENOUS

## 2019-10-14 MED ORDER — ONDANSETRON HCL 4 MG/2ML IJ SOLN: 4.0000 mg | Freq: Four times a day (QID) | INTRAMUSCULAR | Status: DC | PRN

## 2019-10-14 MED ORDER — SODIUM CHLORIDE 0.9% FLUSH
3.0000 mL | Freq: Two times a day (BID) | INTRAVENOUS | Status: DC
  Administered 2019-10-14 – 2019-10-15 (×2): 3 mL via INTRAVENOUS

## 2019-10-14 MED ORDER — HEPARIN (PORCINE) IN NACL 1000-0.9 UT/500ML-% IV SOLN
INTRAVENOUS | Status: AC
  Filled 2019-10-14: qty 1000

## 2019-10-14 MED ORDER — SODIUM CHLORIDE 0.9 % IV SOLN: INTRAVENOUS | Status: DC

## 2019-10-14 MED ORDER — IOHEXOL 350 MG/ML SOLN
INTRAVENOUS | Status: DC | PRN
  Administered 2019-10-14: 70 mL

## 2019-10-14 MED ORDER — SODIUM CHLORIDE 0.9 % IV SOLN
INTRAVENOUS | Status: AC | PRN
  Administered 2019-10-14: 10 mL/h via INTRAVENOUS

## 2019-10-14 MED ORDER — HYDRALAZINE HCL 20 MG/ML IJ SOLN: 10.0000 mg | INTRAMUSCULAR | Status: AC | PRN

## 2019-10-14 MED ORDER — FENTANYL CITRATE (PF) 100 MCG/2ML IJ SOLN
INTRAMUSCULAR | Status: AC
  Filled 2019-10-14: qty 2

## 2019-10-14 MED ORDER — SPIRONOLACTONE 12.5 MG HALF TABLET
12.5000 mg | ORAL_TABLET | Freq: Every day | ORAL | Status: DC
  Administered 2019-10-14 – 2019-10-15 (×2): 12.5 mg via ORAL
  Filled 2019-10-14 (×2): qty 1

## 2019-10-14 MED ORDER — HEPARIN (PORCINE) IN NACL 1000-0.9 UT/500ML-% IV SOLN
INTRAVENOUS | Status: DC | PRN
  Administered 2019-10-14 (×2): 500 mL

## 2019-10-14 MED ORDER — SODIUM CHLORIDE 0.9% FLUSH: 3.0000 mL | INTRAVENOUS | Status: DC | PRN

## 2019-10-14 MED ORDER — LOSARTAN POTASSIUM 25 MG PO TABS
25.0000 mg | ORAL_TABLET | Freq: Every day | ORAL | Status: DC
  Administered 2019-10-14 – 2019-10-15 (×2): 25 mg via ORAL
  Filled 2019-10-14 (×2): qty 1

## 2019-10-14 MED ORDER — VERAPAMIL HCL 2.5 MG/ML IV SOLN
INTRAVENOUS | Status: DC | PRN
  Administered 2019-10-14: 10 mL via INTRA_ARTERIAL

## 2019-10-14 MED ORDER — LIDOCAINE HCL (PF) 1 % IJ SOLN
INTRAMUSCULAR | Status: AC
  Filled 2019-10-14: qty 30

## 2019-10-14 MED ORDER — SODIUM CHLORIDE 0.9 % IV SOLN: 250.0000 mL | INTRAVENOUS | Status: DC | PRN

## 2019-10-14 MED ORDER — LABETALOL HCL 5 MG/ML IV SOLN: 10.0000 mg | INTRAVENOUS | Status: AC | PRN

## 2019-10-14 MED ORDER — ENOXAPARIN SODIUM 40 MG/0.4ML ~~LOC~~ SOLN
40.0000 mg | SUBCUTANEOUS | Status: DC
  Administered 2019-10-15: 40 mg via SUBCUTANEOUS
  Filled 2019-10-14: qty 0.4

## 2019-10-14 MED ORDER — LIDOCAINE HCL (PF) 1 % IJ SOLN
INTRAMUSCULAR | Status: DC | PRN
  Administered 2019-10-14 (×2): 2 mL

## 2019-10-14 MED ORDER — HEPARIN SODIUM (PORCINE) 1000 UNIT/ML IJ SOLN
INTRAMUSCULAR | Status: DC | PRN
  Administered 2019-10-14: 5000 [IU] via INTRAVENOUS

## 2019-10-14 MED ORDER — HEPARIN SODIUM (PORCINE) 1000 UNIT/ML IJ SOLN
INTRAMUSCULAR | Status: AC
  Filled 2019-10-14: qty 1

## 2019-10-14 MED ORDER — ACETAMINOPHEN 325 MG PO TABS: 650.0000 mg | ORAL_TABLET | ORAL | Status: DC | PRN

## 2019-10-14 MED ORDER — ASPIRIN 81 MG PO CHEW
81.0000 mg | CHEWABLE_TABLET | Freq: Every day | ORAL | Status: DC
  Administered 2019-10-14 – 2019-10-15 (×2): 81 mg via ORAL
  Filled 2019-10-14 (×2): qty 1

## 2019-10-14 MED ORDER — POTASSIUM CHLORIDE CRYS ER 20 MEQ PO TBCR
40.0000 meq | EXTENDED_RELEASE_TABLET | Freq: Two times a day (BID) | ORAL | Status: DC
  Administered 2019-10-14: 40 meq via ORAL
  Administered 2019-10-15: 20 meq via ORAL
  Filled 2019-10-14 (×2): qty 2

## 2019-10-14 MED ORDER — SODIUM CHLORIDE 0.9% FLUSH: 3.0000 mL | Freq: Two times a day (BID) | INTRAVENOUS | Status: DC

## 2019-10-14 MED ORDER — FUROSEMIDE 10 MG/ML IJ SOLN
80.0000 mg | Freq: Two times a day (BID) | INTRAMUSCULAR | Status: DC
  Administered 2019-10-14 – 2019-10-15 (×2): 80 mg via INTRAVENOUS
  Filled 2019-10-14 (×2): qty 8

## 2019-10-14 MED ORDER — MIDAZOLAM HCL 2 MG/2ML IJ SOLN
INTRAMUSCULAR | Status: DC | PRN
  Administered 2019-10-14: 1 mg via INTRAVENOUS

## 2019-10-14 SURGICAL SUPPLY — 13 items
CATH 5FR JL3.5 JR4 ANG PIG MP (CATHETERS) ×1 IMPLANT
CATH BALLN WEDGE 5F 110CM (CATHETERS) ×1 IMPLANT
DEVICE RAD COMP TR BAND LRG (VASCULAR PRODUCTS) ×1 IMPLANT
GLIDESHEATH SLEND SS 6F .021 (SHEATH) ×1 IMPLANT
GUIDEWIRE INQWIRE 1.5J.035X260 (WIRE) IMPLANT
INQWIRE 1.5J .035X260CM (WIRE) ×2
KIT HEART LEFT (KITS) ×1 IMPLANT
PACK CARDIAC CATHETERIZATION (CUSTOM PROCEDURE TRAY) ×2 IMPLANT
SHEATH GLIDE SLENDER 4/5FR (SHEATH) ×1 IMPLANT
SHEATH PROBE COVER 6X72 (BAG) ×1 IMPLANT
TRANSDUCER W/STOPCOCK (MISCELLANEOUS) ×2 IMPLANT
TUBING CIL FLEX 10 FLL-RA (TUBING) ×1 IMPLANT
WIRE EMERALD 3MM-J .025X260CM (WIRE) ×1 IMPLANT

## 2019-10-14 NOTE — Progress Notes (Signed)
  Echocardiogram 2D Echocardiogram has been performed.  Delcie Roch 10/14/2019, 11:41 AM

## 2019-10-14 NOTE — Care Management (Signed)
Consented to assist with prescriptions. Patient going for heart cath today. Will continue to follow. Ronny Flurry

## 2019-10-14 NOTE — Consult Note (Addendum)
Cardiology Consultation:   Patient ID: Shelly [name] MRN: [id number]; DOB: [birth year]  Admit date: 10/13/2019 Date of Consult: 10/14/2019  Primary Care Provider: [facility] Cardiologist:New   Patient Profile:   Shelly [name] is a 49 y.o. female with a hx of diabetes mellitus and ongoing tobacco smoking who is being seen today for the evaluation of chest pain and shortness of breath at the request of Dr. Myna Hidalgo.   Prior cardiac history or stroke.  Strong family history of CAD.  Mother died suddenly from some sort of cardiomyopathy at age 45.  Father had MI at age 66 requiring stenting.  Both grandparents from father side has history of CAD.  Patient never had echocardiogram.  History of Present Illness:   Shelly [name] presented for evaluation of worsening shortness of breath and chest tightness.  Patient reports at least 70-months history of progressive worsening bilateral lower extremity edema and early satiety.  Patient had some sort of retinal hemorrhage (not due to diabetes) which has been resolved.  This was noted due to blurred vision.  For the past 3 to 4 weeks her shortness of breath with activity has got worsened.  Then she started to having substernal chest tightness at with intermittent radiation to her back.  Sometimes associated with palpitation.  Initially she felt it was due to anxiety.  Her symptoms were intermittent and resolved with rest within 10 minutes.  She reports worsening chest tightness and shortness of breath with bending.  No cough, dizziness, syncope or melena.  Recently seen by PCP and plan was to get an echocardiogram prior to starting diuretics but due to worsening symptoms she came to ER for further evaluation.  BNP 1913.  High-sensitivity troponin 14>>13.  Respiratory panel negative for influenza and Covid.  Chest x-ray showed pulmonary edema versus infection.  CT angio of chest negative for pulmonary embolism however showed nonspecific  infectious or inflammatory bronchitis.  Cardiomegaly with trace pericardial effusion.  Scattered coronary calcification.  Small volume ascites in upper abdomen.  Abdominal ultrasound showed small free fluid in the upper abdomen with features concerning for cirrhosis.  She was started on IV Lasix.  Diurese 1.6 L.  Pending reading of echocardiogram.  Patient denies alcohol abuse or illicit drug use.  She smokes 1 pack a day, used to smoke 2-1/2 pack/day.  No regular exercise.  Takes care of grandchildren and works from home.   Past Medical History:  Diagnosis Date   Diabetes mellitus without complication (HCC)    Tobacco abuse     Inpatient Medications: Scheduled Meds:  aspirin  81 mg Oral Daily   enoxaparin (LOVENOX) injection  40 mg Subcutaneous Q24H   furosemide  40 mg Intravenous Q12H   insulin aspart  0-5 Units Subcutaneous QHS   insulin aspart  0-9 Units Subcutaneous TID WC   sodium chloride flush  3 mL Intravenous Once   sodium chloride flush  3 mL Intravenous Q12H   Continuous Infusions:  sodium chloride     PRN Meds: sodium chloride, acetaminophen, ondansetron (ZOFRAN) IV, sodium chloride flush  Allergies:   No Known Allergies  Social History:   Social History   Socioeconomic History   Marital status: Married    Spouse name: Not on file   Number of children: Not on file   Years of education: Not on file   Highest education level: Not on file  Occupational History   Not on file  Tobacco Use   Smoking status: Current Every  Day Smoker   Smokeless tobacco: Never Used  Substance and Sexual Activity   Alcohol use: Yes   Drug use: Never   Sexual activity: Not on file  Other Topics Concern   Not on file  Social History Narrative   Not on file   Social Determinants of Health   Financial Resource Strain:    Difficulty of Paying Living Expenses:   Food Insecurity:    Worried About Programme researcher, broadcasting/film/video in the Last Year:    Barista  in the Last Year:   Transportation Needs:    Freight forwarder (Medical):    Lack of Transportation (Non-Medical):   Physical Activity:    Days of Exercise per Week:    Minutes of Exercise per Session:   Stress:    Feeling of Stress :   Social Connections:    Frequency of Communication with Friends and Family:    Frequency of Social Gatherings with Friends and Family:    Attends Religious Services:    Active Member of Clubs or Organizations:    Attends Engineer, structural:    Marital Status:   Intimate Partner Violence:    Fear of Current or Ex-Partner:    Emotionally Abused:    Physically Abused:    Sexually Abused:     Family History:   Family History  Problem Relation Age of Onset   Sudden Cardiac Death Mother    CAD Father    CAD Paternal Grandmother    CAD Paternal Grandfather      ROS:  Please see the history of present illness.  All other ROS reviewed and negative.     Physical Exam/Data:   Vitals:   10/13/19 2217 10/13/19 2334 10/14/19 0547 10/14/19 1143  BP:  127/78 112/77 117/81  Pulse:  92 89 87  Resp:   20 18  Temp:  97.6 F (36.4 C) (!) 97.5 F (36.4 C) (!) 97.4 F (36.3 C)  TempSrc:  Oral Oral Oral  SpO2:  96% 95% 98%  Weight: 108 kg 110.1 kg 106.4 kg   Height: 5\' 8"  (1.727 m)       Intake/Output Summary (Last 24 hours) at 10/14/2019 1244 Last data filed at 10/14/2019 1211 Gross per 24 hour  Intake 720 ml  Output 2400 ml  Net -1680 ml   Last 3 Weights 10/14/2019 10/13/2019 10/13/2019  Weight (lbs) 234 lb 8 oz 242 lb 11.6 oz 238 lb  Weight (kg) 106.369 kg 110.1 kg 107.956 kg     Body mass index is 35.66 kg/m.  General:  Well nourished, well developed, in no acute distress HEENT: normal Lymph: no adenopathy Neck: + JVD Endocrine:  No thryomegaly Vascular: No carotid bruits; FA pulses 2+ bilaterally without bruits  Cardiac:  normal S1, S2; RRR; no murmur  Lungs:  clear to auscultation bilaterally, no wheezing,  rhonchi or rales  Abd: soft, nontender, no hepatomegaly  Ext: no edema Musculoskeletal:  No deformities, BUE and BLE strength normal and equal Skin: warm and dry  Neuro:  CNs 2-12 intact, no focal abnormalities noted Psych:  Normal affect   EKG:  The EKG was personally reviewed and demonstrates:  Sinus tachycardia at 101 bpm Telemetry:  Telemetry was personally reviewed and demonstrates:  NSR   Relevant CV Studies: Pending echo reading   Laboratory Data:  High Sensitivity Troponin:   Recent Labs  Lab 10/13/19 1527 10/13/19 1800  TROPONINIHS 14 13     Chemistry Recent Labs  Lab 10/13/19 1527 10/14/19 0540  NA 136 143  K 3.5 3.7  CL 96* 99  CO2 29 29  GLUCOSE 209* 153*  BUN 6 6  CREATININE 0.84 0.82  CALCIUM 9.0 9.3  GFRNONAA >60 >60  GFRAA >60 >60  ANIONGAP 11 15    Recent Labs  Lab 10/13/19 1527 10/14/19 0540  PROT 6.1* 6.1*  ALBUMIN 3.6 3.4*  AST 17 14*  ALT 11 11  ALKPHOS 109 105  BILITOT 2.1* 2.6*   Hematology Recent Labs  Lab 10/13/19 1527 10/14/19 0540  WBC 8.3 7.5  RBC 5.46* 5.38*  HGB 13.0 13.0  HCT 45.6 44.5  MCV 83.5 82.7  MCH 23.8* 24.2*  MCHC 28.5* 29.2*  RDW 15.3 15.4  PLT 259 242   BNP Recent Labs  Lab 10/13/19 1958  BNP 1,913.7*    DDimer No results for input(s): DDIMER in the last 168 hours.   Radiology/Studies:  DG Chest 2 View  Result Date: 10/13/2019 CLINICAL DATA:  Chest pain EXAM: CHEST - 2 VIEW COMPARISON:  08/07/2016 FINDINGS: Heart size is upper limits of normal. Mild interstitial prominence throughout both lungs without focal airspace consolidation, pleural effusion, or pneumothorax. The visualized skeletal structures are unremarkable. IMPRESSION: 1. Mild interstitial prominence throughout both lungs which may reflect edema versus atypical infection. No focal airspace consolidation. 2. Heart size upper limits of normal. Electronically Signed   By: Duanne Guess D.O.   On: 10/13/2019 15:40   CT Angio Chest PE W  and/or Wo Contrast  Result Date: 10/13/2019 CLINICAL DATA:  Shortness of breath EXAM: CT ANGIOGRAPHY CHEST WITH CONTRAST TECHNIQUE: Multidetector CT imaging of the chest was performed using the standard protocol during bolus administration of intravenous contrast. Multiplanar CT image reconstructions and MIPs were obtained to evaluate the vascular anatomy. CONTRAST:  OMNIPAQUE IOHEXOL 350 MG/ML SOLN COMPARISON:  03/03/2010 FINDINGS: Cardiovascular: Satisfactory opacification of the pulmonary arteries to the segmental level. No evidence of pulmonary embolism. Cardiomegaly. Scattered coronary artery calcifications. Trace pericardial effusion. Aortic atherosclerosis. Mediastinum/Nodes: Prominent nonspecific mediastinal and hilar lymph nodes. Thyroid gland, trachea, and esophagus demonstrate no significant findings. Lungs/Pleura: Diffuse bilateral bronchial wall thickening. No pleural effusion or pneumothorax. Upper Abdomen: No acute abnormality. Small volume ascites in the included upper abdomen. Musculoskeletal: No chest wall abnormality. No acute or significant osseous findings. Review of the MIP images confirms the above findings. IMPRESSION: 1. Negative examination for pulmonary embolism. 2. Diffuse bilateral bronchial wall thickening, consistent with nonspecific infectious or inflammatory bronchitis. 3. Prominent nonspecific mediastinal and hilar lymph nodes, likely reactive. 4. Cardiomegaly and trace pericardial effusion. 5. Scattered coronary artery calcifications. 6. Small volume ascites in the included upper abdomen. 7. Aortic Atherosclerosis (ICD10-I70.0). Electronically Signed   By: Lauralyn Primes M.D.   On: 10/13/2019 20:50   US Abdomen Limited RUQ  Result Date: 10/13/2019 CLINICAL DATA:  Hyperbilirubinemia. EXAM: ULTRASOUND ABDOMEN LIMITED RIGHT UPPER QUADRANT COMPARISON:  None recent FINDINGS: Gallbladder: Surgically absent Common bile duct: Diameter: 7 mm Liver: The liver parenchyma is coarsened  and heterogeneous. There is suggestion of a subtle nodular contour. There is no evidence for discrete hepatic mass. Portal vein is patent on color Doppler imaging with normal direction of blood flow towards the liver. Other: There is a small volume of abdominal ascites. IMPRESSION: 1. No acute cholecystectomy. 2. Findings suspicious for underlying cirrhosis. 3. Small volume free fluid in the upper abdomen. Electronically Signed   By: Katherine Mantle M.D.   On: 10/13/2019 22:58   :[id number]} HEAR  Score (for undifferentiated chest pain):  HEAR Score: 4    Assessment and Plan:   1. Acute congestive heart failure Patient with at least 5 months history of lower extremity edema and dyspnea on exertion.  Recently started to having chest tightness with intermittent shortness of breath and palpitation.  Chest tightness worsened with bending.  She has early satiety. -BNP ~1900.  Chest x-ray edema versus atypical infection.  CT angio of chest negative for pulmonary embolism however cardiomegaly with trace pericardial effusion.  Small volume ascites. -Exam consistent with volume overload -Patient reports baseline weight of 210 pounds couple of months ago, here 238 pounds -Continue IV diuresis -Strict INO and daily weight -Echocardiogram has been done, pending reading>> preliminary review by myself look like BiV failure>> L & R cath on Monday   2.  Chest tightness -Rule out of ACS.  High-sensitivity troponin negative x2.  Significant risk factor including obesity, diabetes mellitus and strong family history of CAD. -Likely she will need ischemic evaluation this admission  3.  Diabetes mellitus -Per primary team  4.  Tobacco abuse -Strongly recommended cessation -Education given   Dr. Eldridge Dace to see.    For questions or updates, please contact [facility] Please consult [email or web address] for contact info under     Signed, Manson Passey, PA  10/14/2019 12:44 PM   I have examined the  patient and reviewed assessment and plan and discussed with patient.  Agree with above as stated.    I personally reviewed the echo images.  She has severe biventricular heart failure.  She will need a right and left heart catheterization along with continued diuresis.  Once her heart cath is done, will likely add Entresto and beta-blocker once she is more compensated.  We wanted to do the heart catheterization on Monday.  This was recommended but she was very adamant that she needs to go home.  She is stressed out that she does not have insurance.  She does not feel like she can stay in the hospital for 2 more days without severe anxiety.  I again stated my recommendation that she try to get IV Lasix over the weekend to help with the weight gain and fluid overload, followed by her heart cath on Monday.  Again, she insisted that she go home.  She does assure me that she will come back for test.  She realizes this is a serious problem but just does not feel that mentally, she can stay in the hospital.  We will try to arrange for outpatient catheterization for her next week.  Plan Lasix 40 mg twice daily over the weekend.  She will need a repeat bmet and likely Covid test before her heart catheterization.  Further plans based on cath results.  Lance Muss

## 2019-10-14 NOTE — Progress Notes (Signed)
Progress Note    Shelly [name]  OXB:[id number] DOB: [birth year]  DOA: 10/13/2019 PCP: [facility]    Brief Narrative:    Medical records reviewed and are as summarized below:  Shelly [name] is a very pleasant 49 y.o. female with a past medical history that includes diabetes type 2 just recently started on Metformin, obesity, current tobacco use presented to the emergency department with ongoing worsening shortness of breath, dyspnea with exertion, orthopnea, lower extremity edema, chest tightness.  Work-up concerning for heart failure.  Assessment/Plan:   Principal Problem:   Acute CHF (congestive heart failure) (HCC) Active Problems:   Chest pain   Diabetes mellitus without complication (HCC)   Hyperbilirubinemia   Obesity   Pericardial effusion   Ascites   Current smoker   #1.  Acute CHF new onset.  Persistent worsening bilateral lower extremity edema, orthopnea, dyspnea with exertion.  BNP 1914.  Chest x-ray with mild interstitial prominence throughout both lungs which may reflect edema versus atypical infection no focal airspace consolidation, heart size upper limits of normal, CT angio of the chest negative for PE, diffuse bilateral bronchial wall thickening consistent with nonspecific infectious or inflammatory bronchitis, prominent nonspecific mediastinal and hilar lymph nodes, cardiomegaly with trace pericardial effusion, scattered coronary artery calcifications, small volume ascites, aortic atherosclerosis.  Lasix IV started.  EKG with sinus tach. If weights accurate down 8lb. (is lasix naive)  -Follow echo -Continue IV Lasix -Monitor intake and output -Obtain daily weights -Have requested a cardiology consult  2.  Chest pain/small pericardial effusion per CTA.  Patient continues with intermittent chest "pressure".  She reports she had an episode this morning when she was bending down picking something up off the floor.  Associated symptoms include  diaphoresis nausea without vomiting.  Lasted less than 5 minutes. Not reproducible.  EKG as noted above.  Chest x-ray and CT as noted above.  High-sensitivity troponins negative x2.  Risk factors include obesity, smoking, diabetes, family medical history. -Supportive therapy. -aspirin 81mg  -Lipid panel - repeat EKG if another if she has another episode. -Continue Lasix as noted above -Cardiology consult requested.  #3.  Hyperbilirubinemia/ascites.  Other liver function tests within the limits of normal.  Right upper quadrant ultrasound reveals findings that are suspicious for underlying cirrhosis, small volume free fluid in the upper abdomen.  Likely related to #1.  No current or history EtOH use.  Denies drug use -monitor -may need OP follow up with gi  #4.  Diabetes type 2.  Patient reports she was just recently started on Metformin 30 days ago.  Reports a recent hemoglobin A1c greater than 9.  Serum glucose 209 on admission. -Continue to hold Metformin for now -Sliding scale insulin for optimal control -Monitor  #5.  Obesity.  BMI 36.   #6.  Current tobacco use. -Cessation counseling offered  Family Communication/Anticipated D/C date and plan/Code Status   DVT prophylaxis: Lovenox ordered. Code Status: Full Code.  Family Communication: patient  Disposition Plan: Status is: Observation  The patient remains OBS appropriate and will d/c before 2 midnights.  Dispo: The patient is from: Home              Anticipated d/c is to: Home              Anticipated d/c date is: 1 day              Patient currently is not medically stable to d/c.    Medical Consultants:  None.   Anti-Infectives:    None  Subjective:   Lying in bed.  Complains of brief episode of chest "tightness" while bending over to pick something up off the floor.  Lasted less than 5 minutes.  Associated with diaphoresis and nausea shortness of breath.  Did not call the nurse.  Otherwise no  complaints  Objective:    Vitals:   10/13/19 2200 10/13/19 2217 10/13/19 2334 10/14/19 0547  BP: 126/88  127/78 112/77  Pulse: 95  92 89  Resp: (!) 22   20  Temp:   97.6 F (36.4 C) (!) 97.5 F (36.4 C)  TempSrc:   Oral Oral  SpO2: 93%  96% 95%  Weight:  108 kg 110.1 kg 106.4 kg  Height:  5\' 8"  (1.727 m)      Intake/Output Summary (Last 24 hours) at 10/14/2019 1031 Last data filed at 10/14/2019 0858 Gross per 24 hour  Intake 240 ml  Output 1800 ml  Net -1560 ml   Filed Weights   10/13/19 2217 10/13/19 2334 10/14/19 0547  Weight: 108 kg 110.1 kg 106.4 kg    Exam: General: Obese calm cooperative no acute distress CV: Regular rate and rhythm no murmur gallop or rub moderate to severe bilateral LE edema from thighs to toes. Not really pitting but somewhat tight and painful Respiratory: No increased work of breathing breath sounds are distant slightly coarse but I hear no crackles no wheeze Abdomen: Nondistended soft positive bowel sounds throughout nontender to palpation no mass organomegaly noted Musculoskeletal: Joints without swelling/erythema moves all extremities spontaneously Neuro: Awake alert oriented x3 speech clear facial symmetry  Data Reviewed:   I have personally reviewed following labs and imaging studies:  Labs: Labs show the following:   Basic Metabolic Panel: Recent Labs  Lab 10/13/19 1527 10/14/19 0540  NA 136 143  K 3.5 3.7  CL 96* 99  CO2 29 29  GLUCOSE 209* 153*  BUN 6 6  CREATININE 0.84 0.82  CALCIUM 9.0 9.3   GFR Estimated Creatinine Clearance: 104.8 mL/min (by C-G formula based on SCr of 0.82 mg/dL). Liver Function Tests: Recent Labs  Lab 10/13/19 1527 10/14/19 0540  AST 17 14*  ALT 11 11  ALKPHOS 109 105  BILITOT 2.1* 2.6*  PROT 6.1* 6.1*  ALBUMIN 3.6 3.4*   No results for input(s): LIPASE, AMYLASE in the last 168 hours. No results for input(s): AMMONIA in the last 168 hours. Coagulation profile No results for input(s):  INR, PROTIME in the last 168 hours.  CBC: Recent Labs  Lab 10/13/19 1527 10/14/19 0540  WBC 8.3 7.5  NEUTROABS  --  4.5  HGB 13.0 13.0  HCT 45.6 44.5  MCV 83.5 82.7  PLT 259 242   Cardiac Enzymes: No results for input(s): CKTOTAL, CKMB, CKMBINDEX, TROPONINI in the last 168 hours. BNP (last 3 results) No results for input(s): PROBNP in the last 8760 hours. CBG: Recent Labs  Lab 10/13/19 2151 10/14/19 0544  GLUCAP 141* 149*   D-Dimer: No results for input(s): DDIMER in the last 72 hours. Hgb A1c: No results for input(s): HGBA1C in the last 72 hours. Lipid Profile: No results for input(s): CHOL, HDL, LDLCALC, TRIG, CHOLHDL, LDLDIRECT in the last 72 hours. Thyroid function studies: No results for input(s): TSH, T4TOTAL, T3FREE, THYROIDAB in the last 72 hours.  Invalid input(s): FREET3 Anemia work up: No results for input(s): VITAMINB12, FOLATE, FERRITIN, TIBC, IRON, RETICCTPCT in the last 72 hours. Sepsis Labs: Recent Labs  Lab 10/13/19  1527 10/14/19 0540  WBC 8.3 7.5    Microbiology Recent Results (from the past 240 hour(s))  Respiratory Panel by RT PCR (Flu A&B, Covid) - Nasopharyngeal Swab     Status: None   Collection Time: 10/13/19  8:09 PM   Specimen: Nasopharyngeal Swab  Result Value Ref Range Status   SARS Coronavirus 2 by RT PCR NEGATIVE NEGATIVE Final    Comment: (NOTE) SARS-CoV-2 target nucleic acids are NOT DETECTED. The SARS-CoV-2 RNA is generally detectable in upper respiratoy specimens during the acute phase of infection. The lowest concentration of SARS-CoV-2 viral copies this assay can detect is 131 copies/mL. A negative result does not preclude SARS-Cov-2 infection and should not be used as the sole basis for treatment or other patient management decisions. A negative result may occur with  improper specimen collection/handling, submission of specimen other than nasopharyngeal swab, presence of viral mutation(s) within the areas targeted by  this assay, and inadequate number of viral copies (<131 copies/mL). A negative result must be combined with clinical observations, patient history, and epidemiological information. The expected result is Negative. Fact Sheet for Patients:  https://www.moore.com/ Fact Sheet for Healthcare Providers:  https://www.young.biz/ This test is not yet ap proved or cleared by the [place] FDA and  has been authorized for detection and/or diagnosis of SARS-CoV-2 by FDA under an Emergency Use Authorization (EUA). This EUA will remain  in effect (meaning this test can be used) for the duration of the COVID-19 declaration under Section 564(b)(1) of the Act, 21 U.S.C. section 360bbb-3(b)(1), unless the authorization is terminated or revoked sooner.    Influenza A by PCR NEGATIVE NEGATIVE Final   Influenza B by PCR NEGATIVE NEGATIVE Final    Comment: (NOTE) The Xpert Xpress SARS-CoV-2/FLU/RSV assay is intended as an aid in  the diagnosis of influenza from Nasopharyngeal swab specimens and  should not be used as a sole basis for treatment. Nasal washings and  aspirates are unacceptable for Xpert Xpress SARS-CoV-2/FLU/RSV  testing. Fact Sheet for Patients: https://www.moore.com/ Fact Sheet for Healthcare Providers: https://www.young.biz/ This test is not yet approved or cleared by the [place] FDA and  has been authorized for detection and/or diagnosis of SARS-CoV-2 by  FDA under an Emergency Use Authorization (EUA). This EUA will remain  in effect (meaning this test can be used) for the duration of the  Covid-19 declaration under Section 564(b)(1) of the Act, 21  U.S.C. section 360bbb-3(b)(1), unless the authorization is  terminated or revoked. Performed at [facility], [address], [place], [place] [address]     Procedures and diagnostic studies:  DG Chest 2 View  Result Date:  10/13/2019 CLINICAL DATA:  Chest pain EXAM: CHEST - 2 VIEW COMPARISON:  08/07/2016 FINDINGS: Heart size is upper limits of normal. Mild interstitial prominence throughout both lungs without focal airspace consolidation, pleural effusion, or pneumothorax. The visualized skeletal structures are unremarkable. IMPRESSION: 1. Mild interstitial prominence throughout both lungs which may reflect edema versus atypical infection. No focal airspace consolidation. 2. Heart size upper limits of normal. Electronically Signed   By: Duanne Guess D.O.   On: 10/13/2019 15:40   CT Angio Chest PE W and/or Wo Contrast  Result Date: 10/13/2019 CLINICAL DATA:  Shortness of breath EXAM: CT ANGIOGRAPHY CHEST WITH CONTRAST TECHNIQUE: Multidetector CT imaging of the chest was performed using the standard protocol during bolus administration of intravenous contrast. Multiplanar CT image reconstructions and MIPs were obtained to evaluate the vascular anatomy. CONTRAST:  OMNIPAQUE IOHEXOL  350 MG/ML SOLN COMPARISON:  03/03/2010 FINDINGS: Cardiovascular: Satisfactory opacification of the pulmonary arteries to the segmental level. No evidence of pulmonary embolism. Cardiomegaly. Scattered coronary artery calcifications. Trace pericardial effusion. Aortic atherosclerosis. Mediastinum/Nodes: Prominent nonspecific mediastinal and hilar lymph nodes. Thyroid gland, trachea, and esophagus demonstrate no significant findings. Lungs/Pleura: Diffuse bilateral bronchial wall thickening. No pleural effusion or pneumothorax. Upper Abdomen: No acute abnormality. Small volume ascites in the included upper abdomen. Musculoskeletal: No chest wall abnormality. No acute or significant osseous findings. Review of the MIP images confirms the above findings. IMPRESSION: 1. Negative examination for pulmonary embolism. 2. Diffuse bilateral bronchial wall thickening, consistent with nonspecific infectious or inflammatory bronchitis. 3. Prominent nonspecific  mediastinal and hilar lymph nodes, likely reactive. 4. Cardiomegaly and trace pericardial effusion. 5. Scattered coronary artery calcifications. 6. Small volume ascites in the included upper abdomen. 7. Aortic Atherosclerosis (ICD10-I70.0). Electronically Signed   By: Lauralyn Primes M.D.   On: 10/13/2019 20:50   US Abdomen Limited RUQ  Result Date: 10/13/2019 CLINICAL DATA:  Hyperbilirubinemia. EXAM: ULTRASOUND ABDOMEN LIMITED RIGHT UPPER QUADRANT COMPARISON:  None recent FINDINGS: Gallbladder: Surgically absent Common bile duct: Diameter: 7 mm Liver: The liver parenchyma is coarsened and heterogeneous. There is suggestion of a subtle nodular contour. There is no evidence for discrete hepatic mass. Portal vein is patent on color Doppler imaging with normal direction of blood flow towards the liver. Other: There is a small volume of abdominal ascites. IMPRESSION: 1. No acute cholecystectomy. 2. Findings suspicious for underlying cirrhosis. 3. Small volume free fluid in the upper abdomen. Electronically Signed   By: Katherine Mantle M.D.   On: 10/13/2019 22:58    Medications:   . enoxaparin (LOVENOX) injection  40 mg Subcutaneous Q24H  . furosemide  40 mg Intravenous Q12H  . insulin aspart  0-5 Units Subcutaneous QHS  . insulin aspart  0-9 Units Subcutaneous TID WC  . sodium chloride flush  3 mL Intravenous Once  . sodium chloride flush  3 mL Intravenous Q12H   Continuous Infusions: . sodium chloride       LOS: 0 days   Gwenyth Bender NP  [facility]   How to contact the [facility] Attending or Consulting provider 7A - 7P or covering provider during after hours 7P -7A, for this patient?  1. Check the care team in [facility] and look for a) attending/consulting [facility] provider listed and b) the [facility] team listed 2. Log into [email or web address] and use [facility] universal password to access. If you do not have the password, please contact the hospital operator. 3. Locate the [facility] provider you are looking for  under [facility] and page to a number that you can be directly reached. 4. If you still have difficulty reaching the provider, please page the [facility] (Director on Call) for the Hospitalists listed on amion for assistance.  10/14/2019, 10:31 AM

## 2019-10-14 NOTE — Consult Note (Addendum)
Advanced Heart Failure Team Consult Note   Primary Physician: [facility] PCP-Cardiologist:  No primary care provider on file.  Reason for Consultation: Heart Failure   HPI:    Shelly [name] is seen today for evaluation of heart failure at the request of Dr Isabel Caprice.   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  In December she had viral illness. She has completed both COVID vaccines.   Family History: Mother from died cardiomyopathy. Father had 56 at MI at age of 78. Both grandparents from father side has history of CAD.  SH: Smokes 1PPD, works full time as Psychologist, clinical, lives with her husband and son.   Over the last month she has had progressive dyspnea and fatigue. Also had 30 pound weight gain. Has also had chest pain but she thinks this is just anxiety. Also had increased leg edema.   Presented to [facility] with chest pain and increased dyspnea. HS Trop negative. BNP elevated.  CXR with edema versus atypical infection.CTA - negative PE, inflammatory bronchitis, scattered coronary calcifications, and aortic atherosclerosis.   Pertinent admission labs included: BNP 1913, HS Trop 14>13, negative COVID, HIV NR,  Total Bili 2.1 , and WBC 8.3.   Started on IV lasix. I&O not accurate. Weights are all over the place. Anxious today and wants to go home.   Echo completed today with severely reduced LVEF ~ 25-30%.   Review of Systems: [y] = yes, [ ]  = no   . General: Weight gain [ Y]; Weight loss [ ] ; Anorexia [ ] ; Fatigue [ Y]; Fever [ ] ; Chills [ ] ; Weakness [Y ]  . Cardiac: Chest pain/pressure [ ] ; Resting SOB [ ] ; Exertional SOB [Y ]; Orthopnea [Y ]; Pedal Edema [Y ]; Palpitations [ ] ; Syncope [ ] ; Presyncope [ ] ; Paroxysmal nocturnal dyspnea[ ]   . Pulmonary: Cough [ ] ; Wheezing[ ] ; Hemoptysis[ ] ; Sputum [ ] ; Snoring [  ]  . GI: Vomiting[ ] ; Dysphagia[ ] ; Melena[ ] ; Hematochezia [ ] ; Heartburn[ ] ; Abdominal pain [ ] ; Constipation [ ] ; Diarrhea [ ] ; BRBPR [ ]   . GU: Hematuria[ ] ; Dysuria [ ] ; Nocturia[ ]   . Vascular: Pain in legs with walking [ ] ; Pain in feet with lying flat [ ] ; Non-healing sores [ ] ; Stroke [ ] ; TIA [ ] ; Slurred speech [ ] ;  . Neuro: Headaches[ ] ; Vertigo[ ] ; Seizures[ ] ; Paresthesias[ ] ;Blurred vision [ ] ; Diplopia [ ] ; Vision changes [ ]   . Ortho/Skin: Arthritis [ ] ; Joint pain [ ] ; Muscle pain [ ] ; Joint swelling [ ] ; Back Pain [ ] ; Rash [ ]   . Psych: Depression[ ] ; Anxiety[Y ]  . Heme: Bleeding problems [ ] ; Clotting disorders [ ] ; Anemia [ ]   . Endocrine: Diabetes [ Y; Thyroid dysfunction[ ]   Home Medications Prior to Admission medications   Medication Sig Start Date End Date Taking? Authorizing Provider  metFORMIN (GLUCOPHAGE) 1000 MG tablet Take 1 tablet by mouth in the morning and at bedtime. 09/13/19  Yes [provider]    Past Medical History: Past Medical History:  Diagnosis Date  . Diabetes mellitus without complication (HCC)   . Tobacco abuse     Past Surgical History: History reviewed. No pertinent surgical history.  Family History: Family History  Problem Relation Age of Onset  . Sudden Cardiac Death Mother   . CAD  Father   . CAD Paternal Grandmother   . CAD Paternal Grandfather     Social History: Social History   Socioeconomic History  . Marital status: Married    Spouse name: Not on file  . Number of children: Not on file  . Years of education: Not on file  . Highest education level: Not on file  Occupational History  . Not on file  Tobacco Use  . Smoking status: Current Every Day Smoker  . Smokeless tobacco: Never Used  Substance and Sexual Activity  . Alcohol use: Yes  . Drug use: Never  . Sexual activity: Not on file  Other Topics Concern  . Not on file  Social History Narrative  . Not on file   Social Determinants of Health    Financial Resource Strain:   . Difficulty of Paying Living Expenses:   Food Insecurity:   . Worried About Charity fundraiser in the Last Year:   . Arboriculturist in the Last Year:   Transportation Needs:   . Film/video editor (Medical):   Marland Kitchen Lack of Transportation (Non-Medical):   Physical Activity:   . Days of Exercise per Week:   . Minutes of Exercise per Session:   Stress:   . Feeling of Stress :   Social Connections:   . Frequency of Communication with Friends and Family:   . Frequency of Social Gatherings with Friends and Family:   . Attends Religious Services:   . Active Member of Clubs or Organizations:   . Attends Archivist Meetings:   Marland Kitchen Marital Status:     Allergies:  No Known Allergies  Objective:    Vital Signs:   Temp:  [97.4 F (36.3 C)-98.2 F (36.8 C)] 97.4 F (36.3 C) (05/07 1143) Pulse Rate:  [87-102] 87 (05/07 1143) Resp:  [17-27] 18 (05/07 1143) BP: (107-137)/(53-94) 117/81 (05/07 1143) SpO2:  [93 %-100 %] 98 % (05/07 1143) Weight:  [106.4 kg-110.1 kg] 106.4 kg (05/07 0547)    Weight change: Filed Weights   10/13/19 2217 10/13/19 2334 10/14/19 0547  Weight: 108 kg 110.1 kg 106.4 kg    Intake/Output:   Intake/Output Summary (Last 24 hours) at 10/14/2019 1448 Last data filed at 10/14/2019 1211 Gross per 24 hour  Intake 720 ml  Output 2400 ml  Net -1680 ml      Physical Exam    General:  In bed.  No resp difficulty. Anxious HEENT: normal Neck: supple. JVP 10-11 . Carotids 2+ bilat; no bruits. No lymphadenopathy or thyromegaly appreciated. Cor: PMI nondisplaced. Regular rate & rhythm. No rubs, gallops or murmurs. Lungs: clear on room air Abdomen: soft, nontender, nondistended. No hepatosplenomegaly. No bruits or masses. Good bowel sounds. Extremities: no cyanosis, clubbing, rash, R and LLE 1+edema with varicosities noted.  Neuro: alert & orientedx3, cranial nerves grossly intact. moves all 4 extremities w/o difficulty.  Affect pleasant   Telemetry   SR  EKG    Sinus Tacn 101 bpm.  Narrow QRS  Labs   Basic Metabolic Panel: Recent Labs  Lab 10/13/19 1527 10/14/19 0540  NA 136 143  K 3.5 3.7  CL 96* 99  CO2 29 29  GLUCOSE 209* 153*  BUN 6 6  CREATININE 0.84 0.82  CALCIUM 9.0 9.3    Liver Function Tests: Recent Labs  Lab 10/13/19 1527 10/14/19 0540  AST 17 14*  ALT 11 11  ALKPHOS 109 105  BILITOT 2.1* 2.6*  PROT 6.1* 6.1*  ALBUMIN 3.6 3.4*   No results for input(s): LIPASE, AMYLASE in the last 168 hours. No results for input(s): AMMONIA in the last 168 hours.  CBC: Recent Labs  Lab 10/13/19 1527 10/14/19 0540  WBC 8.3 7.5  NEUTROABS  --  4.5  HGB 13.0 13.0  HCT 45.6 44.5  MCV 83.5 82.7  PLT 259 242    Cardiac Enzymes: No results for input(s): CKTOTAL, CKMB, CKMBINDEX, TROPONINI in the last 168 hours.  BNP: BNP (last 3 results) Recent Labs    10/13/19 1958  BNP 1,913.7*    ProBNP (last 3 results) No results for input(s): PROBNP in the last 8760 hours.   CBG: Recent Labs  Lab 10/13/19 2151 10/14/19 0544 10/14/19 1106  GLUCAP 141* 149* 121*    Coagulation Studies: No results for input(s): LABPROT, INR in the last 72 hours.   Imaging   DG Chest 2 View  Result Date: 10/13/2019 CLINICAL DATA:  Chest pain EXAM: CHEST - 2 VIEW COMPARISON:  08/07/2016 FINDINGS: Heart size is upper limits of normal. Mild interstitial prominence throughout both lungs without focal airspace consolidation, pleural effusion, or pneumothorax. The visualized skeletal structures are unremarkable. IMPRESSION: 1. Mild interstitial prominence throughout both lungs which may reflect edema versus atypical infection. No focal airspace consolidation. 2. Heart size upper limits of normal. Electronically Signed   By: Duanne Guess D.O.   On: 10/13/2019 15:40   CT Angio Chest PE W and/or Wo Contrast  Result Date: 10/13/2019 CLINICAL DATA:  Shortness of breath EXAM: CT ANGIOGRAPHY CHEST  WITH CONTRAST TECHNIQUE: Multidetector CT imaging of the chest was performed using the standard protocol during bolus administration of intravenous contrast. Multiplanar CT image reconstructions and MIPs were obtained to evaluate the vascular anatomy. CONTRAST:  OMNIPAQUE IOHEXOL 350 MG/ML SOLN COMPARISON:  03/03/2010 FINDINGS: Cardiovascular: Satisfactory opacification of the pulmonary arteries to the segmental level. No evidence of pulmonary embolism. Cardiomegaly. Scattered coronary artery calcifications. Trace pericardial effusion. Aortic atherosclerosis. Mediastinum/Nodes: Prominent nonspecific mediastinal and hilar lymph nodes. Thyroid gland, trachea, and esophagus demonstrate no significant findings. Lungs/Pleura: Diffuse bilateral bronchial wall thickening. No pleural effusion or pneumothorax. Upper Abdomen: No acute abnormality. Small volume ascites in the included upper abdomen. Musculoskeletal: No chest wall abnormality. No acute or significant osseous findings. Review of the MIP images confirms the above findings. IMPRESSION: 1. Negative examination for pulmonary embolism. 2. Diffuse bilateral bronchial wall thickening, consistent with nonspecific infectious or inflammatory bronchitis. 3. Prominent nonspecific mediastinal and hilar lymph nodes, likely reactive. 4. Cardiomegaly and trace pericardial effusion. 5. Scattered coronary artery calcifications. 6. Small volume ascites in the included upper abdomen. 7. Aortic Atherosclerosis (ICD10-I70.0). Electronically Signed   By: Lauralyn Primes M.D.   On: 10/13/2019 20:50   US Abdomen Limited RUQ  Result Date: 10/13/2019 CLINICAL DATA:  Hyperbilirubinemia. EXAM: ULTRASOUND ABDOMEN LIMITED RIGHT UPPER QUADRANT COMPARISON:  None recent FINDINGS: Gallbladder: Surgically absent Common bile duct: Diameter: 7 mm Liver: The liver parenchyma is coarsened and heterogeneous. There is suggestion of a subtle nodular contour. There is no evidence for discrete  hepatic mass. Portal vein is patent on color Doppler imaging with normal direction of blood flow towards the liver. Other: There is a small volume of abdominal ascites. IMPRESSION: 1. No acute cholecystectomy. 2. Findings suspicious for underlying cirrhosis. 3. Small volume free fluid in the upper abdomen. Electronically Signed   By: Katherine Mantle M.D.   On: 10/13/2019 22:58      Medications:     Current Medications: .  ALPRAZolam  0.5 mg Oral Once  . aspirin  81 mg Oral Daily  . enoxaparin (LOVENOX) injection  40 mg Subcutaneous Q24H  . furosemide  40 mg Intravenous Q12H  . insulin aspart  0-5 Units Subcutaneous QHS  . insulin aspart  0-9 Units Subcutaneous TID WC  . sodium chloride flush  3 mL Intravenous Once  . sodium chloride flush  3 mL Intravenous Q12H  . sodium chloride flush  3 mL Intravenous Q12H     Infusions: . 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 Newly diagnosed Acute Systolic heart Failure   Assessment/Plan   1. Acute Systolic Heart Failure  ECHO completed with reduced EF ~ 20%/RV . She has a strong family history of coronary disease. She also had virus back in December.  She is volume overloaded but she does not want to remain in the hospital. She has agreed to  RHC/LHC. If LHC ok would benefit from CMRI though I am not sure she would agree.  - Adjust diuretics after cath.  - Hold off on bb until after cath.   2. Diabetes On sliding scale.  -Check Hgb A1C   3. Suspected OSA  4. Obesity Body mass index is 35.66 kg/m.  5. Elevated Total Bilirubin Elevated bilirubin 2.1   6. Tobacco Abuse Discussed smoking cessation.   Medication concerns reviewed with patient and pharmacy team. Barriers identified: no insurance.  Length of Stay: 0  Tonye Becket, NP    10/14/2019, 2:48 PM  Advanced Heart Failure Team Pager [phone] (M-F; 7a - 4p)  Please contact [facility] for night-coverage after hours (4p -7a ) and weekends on [email or web address]  Patient seen and examined with the above-signed Advanced Practice Provider and/or Housestaff. I personally reviewed laboratory data, imaging studies and relevant notes. I independently examined the patient and formulated the important aspects of the plan. I have edited the note to reflect any of my changes or salient points. I have personally discussed the plan with the patient and/or family.  49 y/o smoker with recently diagnosed DM2. Admitted with CP and HF. Hs trop negative. ECG with possible small anterolateral Qs.   Echo EF 20% with severe RV dysfunction.   General:  Sitting up in bed. No resp difficulty HEENT: normal Neck: supple. + JVP. Carotids 2+ bilat; no bruits. No lymphadenopathy or thryomegaly appreciated. Cor: PMI nondisplaced. Regular. Mildly tachycardia. No rubs, gallops or murmurs. Lungs: decreased BS throughout Abdomen: obese soft, nontender, nondistended. No hepatosplenomegaly. No bruits or masses. Good bowel sounds. Extremities: no cyanosis, clubbing, rash, tr edema Neuro: alert & orientedx3, cranial nerves grossly intact. moves all 4 extremities w/o difficulty. Affect pleasant  Echo with severe biventricular dysfunction. Concern for underlying CAD. Plan R/L cath today. Continue diuresis. Titrate meds as tolerated.   Arvilla Meres, MD  4:02 PM

## 2019-10-15 LAB — MAGNESIUM: Magnesium: 1.7 mg/dL (ref 1.7–2.4)

## 2019-10-15 LAB — GLUCOSE, CAPILLARY
Glucose-Capillary: 123 mg/dL — ABNORMAL HIGH (ref 70–99)
Glucose-Capillary: 196 mg/dL — ABNORMAL HIGH (ref 70–99)

## 2019-10-15 LAB — CBC
HCT: 43.3 % (ref 36.0–46.0)
Hemoglobin: 12.8 g/dL (ref 12.0–15.0)
MCH: 23.9 pg — ABNORMAL LOW (ref 26.0–34.0)
MCHC: 29.6 g/dL — ABNORMAL LOW (ref 30.0–36.0)
MCV: 80.9 fL (ref 80.0–100.0)
Platelets: 250 10*3/uL (ref 150–400)
RBC: 5.35 MIL/uL — ABNORMAL HIGH (ref 3.87–5.11)
RDW: 15.5 % (ref 11.5–15.5)
WBC: 6.5 10*3/uL (ref 4.0–10.5)
nRBC: 0 % (ref 0.0–0.2)

## 2019-10-15 LAB — COMPREHENSIVE METABOLIC PANEL
ALT: 9 U/L (ref 0–44)
AST: 14 U/L — ABNORMAL LOW (ref 15–41)
Albumin: 3.4 g/dL — ABNORMAL LOW (ref 3.5–5.0)
Alkaline Phosphatase: 100 U/L (ref 38–126)
Anion gap: 17 — ABNORMAL HIGH (ref 5–15)
BUN: 9 mg/dL (ref 6–20)
CO2: 31 mmol/L (ref 22–32)
Calcium: 9.3 mg/dL (ref 8.9–10.3)
Chloride: 92 mmol/L — ABNORMAL LOW (ref 98–111)
Creatinine, Ser: 0.88 mg/dL (ref 0.44–1.00)
GFR calc Af Amer: >60 mL/min (ref >60)
GFR calc non Af Amer: >60 mL/min (ref >60)
Glucose, Bld: 135 mg/dL — ABNORMAL HIGH (ref 70–99)
Potassium: 3.2 mmol/L — ABNORMAL LOW (ref 3.5–5.1)
Sodium: 140 mmol/L (ref 135–145)
Total Bilirubin: 3.1 mg/dL — ABNORMAL HIGH (ref 0.3–1.2)
Total Protein: 6.1 g/dL — ABNORMAL LOW (ref 6.5–8.1)

## 2019-10-15 MED ORDER — SPIRONOLACTONE 25 MG PO TABS: 12.5000 mg | ORAL_TABLET | Freq: Every day | ORAL | 0 refills | Status: DC

## 2019-10-15 MED ORDER — POTASSIUM CHLORIDE CRYS ER 20 MEQ PO TBCR
40.0000 meq | EXTENDED_RELEASE_TABLET | Freq: Once | ORAL | Status: AC
  Administered 2019-10-15: 40 meq via ORAL
  Filled 2019-10-15: qty 2

## 2019-10-15 MED ORDER — LOSARTAN POTASSIUM 25 MG PO TABS: 25.0000 mg | ORAL_TABLET | Freq: Every day | ORAL | 0 refills | Status: DC

## 2019-10-15 MED ORDER — POTASSIUM CHLORIDE CRYS ER 20 MEQ PO TBCR: 20.0000 meq | EXTENDED_RELEASE_TABLET | Freq: Every day | ORAL | 0 refills | Status: DC

## 2019-10-15 MED ORDER — FUROSEMIDE 20 MG PO TABS
20.0000 mg | ORAL_TABLET | Freq: Every day | ORAL | 0 refills | Status: DC
Start: 2019-10-15 — End: 2019-10-15

## 2019-10-15 MED ORDER — CARVEDILOL 3.125 MG PO TABS
3.1250 mg | ORAL_TABLET | Freq: Two times a day (BID) | ORAL | 0 refills | Status: DC
Start: 2019-10-15 — End: 2019-10-15

## 2019-10-15 MED ORDER — ATORVASTATIN CALCIUM 80 MG PO TABS
80.0000 mg | ORAL_TABLET | Freq: Every day | ORAL | 0 refills | Status: DC
Start: 2019-10-15 — End: 2019-11-09

## 2019-10-15 MED ORDER — ATORVASTATIN CALCIUM 80 MG PO TABS
80.0000 mg | ORAL_TABLET | Freq: Every day | ORAL | 0 refills | Status: DC
Start: 2019-10-15 — End: 2019-10-15

## 2019-10-15 MED ORDER — DIGOXIN 125 MCG PO TABS: 0.1250 mg | ORAL_TABLET | Freq: Every day | ORAL | 0 refills | Status: DC

## 2019-10-15 MED ORDER — CARVEDILOL 3.125 MG PO TABS
3.1250 mg | ORAL_TABLET | Freq: Two times a day (BID) | ORAL | 0 refills | Status: DC
Start: 2019-10-15 — End: 2019-11-09

## 2019-10-15 MED ORDER — FUROSEMIDE 20 MG PO TABS
20.0000 mg | ORAL_TABLET | Freq: Every day | ORAL | 0 refills | Status: DC
Start: 2019-10-15 — End: 2019-11-09

## 2019-10-15 MED ORDER — ASPIRIN EC 81 MG PO TBEC: 81.0000 mg | DELAYED_RELEASE_TABLET | Freq: Every day | ORAL | 2 refills | Status: AC

## 2019-10-15 NOTE — Discharge Summary (Signed)
Physician Discharge Summary  Shelly [name] ZDG:[id number] DOB: [birth year] DOA: 10/13/2019  PCP: [facility]  Admit date: 10/13/2019 Discharge date: 10/15/2019  Admitted From: Home Discharge disposition: Home   Recommendations for Outpatient Follow-Up:   1. Patient needs cardiac MRI 2. Patient needs close follow-up with the advanced heart care team   Discharge Diagnosis:   Principal Problem:   Acute heart failure (HCC) Active Problems:   Diabetes mellitus without complication (HCC)   Chest pain   Hyperbilirubinemia   Current smoker   Obesity   Pericardial effusion   Ascites   CHF (congestive heart failure) (HCC)    Discharge Condition: Stable.  Diet recommendation: Low sodium, heart healthy.  Carbohydrate-modified.   Wound care: None.  Code status: Full.   History of Present Illness:   Shelly [name] is a 49 y.o. female with medical history significant for type 2 diabetes mellitus and tobacco abuse, now presenting to emergency department with shortness of breath, chest tightness, and swelling.  Patient began to notice that she was gaining weight roughly a month ago and over the past couple weeks has developed new bilateral lower extremity edema and progressive exertional dyspnea.  For the past several days, she has had orthopnea and intermittent chest tightness.  Dyspnea and chest discomfort became more constant today, prompting her presentation to the ED.  She has never had this before, denies fevers or chills, and denies cough.  She denies any alcohol use or any significant acetaminophen use.  Denies abdominal pain, vomiting, or diarrhea.   Hospital Course by Problem:   Acute systolic CHF -EF was found to be between 20 and 25% -Underwent heart cath with diffuse nonobstructive coronary artery disease Patient diuresed well (down 7.5 L in 24 hours) -Was seen by the CHF team who would like to get a cardiac MRI but patient states she is not able  to stay until Monday. -Cardiology recommends the following medications: Aspirin, statin, spironolactone, Coreg, losartan, Lasix, potassium,  Diabetes -Resume home meds -Consider addition of Jardiance  Obesity Estimated body mass index is 34.88 kg/m as calculated from the following:   Height as of this encounter: 5\' 8"  (1.727 m).   Weight as of this encounter: 104.1 kg.  Tobacco abuse -Encourage cessation   Medical Consultants:   CHF team   Discharge Exam:   Vitals:   10/15/19 0740 10/15/19 0938  BP: 113/81 102/64  Pulse: 85 81  Resp: 20 18  Temp: 97.6 F (36.4 C) 97.7 F (36.5 C)  SpO2: 97% 97%   Vitals:   10/15/19 0019 10/15/19 0448 10/15/19 0740 10/15/19 0938  BP: 111/65 (!) 112/58 113/81 102/64  Pulse: 84 83 85 81  Resp: 17 19 20 18   Temp: 98.1 F (36.7 C) 98.2 F (36.8 C) 97.6 F (36.4 C) 97.7 F (36.5 C)  TempSrc: Oral Oral Oral Oral  SpO2: 96% 95% 97% 97%  Weight: 104.1 kg     Height:        General exam: Appears calm and comfortable.   The results of significant diagnostics from this hospitalization (including imaging, microbiology, ancillary and laboratory) are listed below for reference.     Procedures and Diagnostic Studies:   DG Chest 2 View  Result Date: 10/13/2019 CLINICAL DATA:  Chest pain EXAM: CHEST - 2 VIEW COMPARISON:  08/07/2016 FINDINGS: Heart size is upper limits of normal. Mild interstitial prominence throughout both lungs without focal airspace consolidation, pleural effusion, or pneumothorax. The visualized skeletal structures  are unremarkable. IMPRESSION: 1. Mild interstitial prominence throughout both lungs which may reflect edema versus atypical infection. No focal airspace consolidation. 2. Heart size upper limits of normal. Electronically Signed   By: Duanne Guess D.O.   On: 10/13/2019 15:40   CT Angio Chest PE W and/or Wo Contrast  Result Date: 10/13/2019 CLINICAL DATA:  Shortness of breath EXAM: CT ANGIOGRAPHY CHEST WITH  CONTRAST TECHNIQUE: Multidetector CT imaging of the chest was performed using the standard protocol during bolus administration of intravenous contrast. Multiplanar CT image reconstructions and MIPs were obtained to evaluate the vascular anatomy. CONTRAST:  OMNIPAQUE IOHEXOL 350 MG/ML SOLN COMPARISON:  03/03/2010 FINDINGS: Cardiovascular: Satisfactory opacification of the pulmonary arteries to the segmental level. No evidence of pulmonary embolism. Cardiomegaly. Scattered coronary artery calcifications. Trace pericardial effusion. Aortic atherosclerosis. Mediastinum/Nodes: Prominent nonspecific mediastinal and hilar lymph nodes. Thyroid gland, trachea, and esophagus demonstrate no significant findings. Lungs/Pleura: Diffuse bilateral bronchial wall thickening. No pleural effusion or pneumothorax. Upper Abdomen: No acute abnormality. Small volume ascites in the included upper abdomen. Musculoskeletal: No chest wall abnormality. No acute or significant osseous findings. Review of the MIP images confirms the above findings. IMPRESSION: 1. Negative examination for pulmonary embolism. 2. Diffuse bilateral bronchial wall thickening, consistent with nonspecific infectious or inflammatory bronchitis. 3. Prominent nonspecific mediastinal and hilar lymph nodes, likely reactive. 4. Cardiomegaly and trace pericardial effusion. 5. Scattered coronary artery calcifications. 6. Small volume ascites in the included upper abdomen. 7. Aortic Atherosclerosis (ICD10-I70.0). Electronically Signed   By: Lauralyn Primes M.D.   On: 10/13/2019 20:50   CARDIAC CATHETERIZATION  Result Date: 10/14/2019  Prox RCA to Mid RCA lesion is 40% stenosed.  Dist RCA lesion is 50% stenosed.  RPAV lesion is 30% stenosed.  Prox Cx to Mid Cx lesion is 40% stenosed.  3rd Mrg lesion is 20% stenosed.  Mid LAD to Dist LAD lesion is 50% stenosed.  Findings: Ao = 106/75 (87) LV = 107/27 RA = 15 RV = 54/16 PA = 57/25 (40) PCW = 27 (v = 37) Fick cardiac  output/index = 4.4/2.0 PVR = 1.4 WU SVR = 1297 Ao sat = 99% PA sat = 62%, 63% Assessment: 1. CAD with diffuse non-obstructive disease and chronic dissection of mid LAD 2. 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 Elevated filling pressures with moderately reduced output Plan/Discussion: Diurese. Titrate HF meds. Ideally will need cMRI to look for degree of LAD scar. Arvilla Meres, MD 5:23 PM   ECHOCARDIOGRAM COMPLETE  Result Date: 10/14/2019    ECHOCARDIOGRAM REPORT   Patient Name:   ORIANNA BISKUP Date of Exam: 10/14/2019 Medical Rec #:  [id number]     Height:       68.0 in Accession #:    [id number]    Weight:       234.5 lb Date of Birth:  01-Oct-1970      BSA:          2.187 m Patient Age:    49 years      BP:           112/77 mmHg Patient Gender: F             HR:           85 bpm. Exam Location:  Inpatient Procedure: 2D Echo Indications:    chest pain786.50  History:        Patient has no prior history of Echocardiogram examinations.  CHF; Risk Factors:Current Smoker and Diabetes.  Sonographer:    Delcie Roch Referring Phys: [id number] TIMOTHY S OPYD IMPRESSIONS  1. Left ventricular ejection fraction, by estimation, is 20 to 25%. The left ventricle has severely decreased function. The left ventricle demonstrates global hypokinesis. The left ventricular internal cavity size was severely dilated. Left ventricular diastolic function could not be evaluated.  2. Right ventricular systolic function is severely reduced. The right ventricular size is mildly enlarged. There is mildly elevated pulmonary artery systolic pressure.  3. Left atrial size was mildly dilated.  4. Right atrial size was mildly dilated.  5. The mitral valve is normal in structure. Mild mitral valve regurgitation. No evidence of mitral stenosis.  6. The aortic valve is bicuspid. Aortic valve regurgitation is not visualized. No aortic stenosis is present.  7. The inferior vena cava is dilated in size with <50% respiratory  variability, suggesting right atrial pressure of 15 mmHg. Comparison(s): No prior Echocardiogram. Conclusion(s)/Recommendation(s): Severe biventricular failure. FINDINGS  Left Ventricle: Global hypokinesis, worst at the septal, inferior, and anterior walls. Slightly more mild hypokinesis of the base to mid anterolateral and inferolateral wall. Left ventricular ejection fraction, by estimation, is 20 to 25%. The left ventricle has severely decreased function. The left ventricle demonstrates global hypokinesis. The left ventricular internal cavity size was severely dilated. There is no left ventricular hypertrophy. Left ventricular diastolic function could not be evaluated due to nondiagnostic images. Left ventricular diastolic function could not be evaluated. Right Ventricle: The right ventricular size is mildly enlarged. No increase in right ventricular wall thickness. Right ventricular systolic function is severely reduced. There is mildly elevated pulmonary artery systolic pressure. The tricuspid regurgitant velocity is 2.70 m/s, and with an assumed right atrial pressure of 15 mmHg, the estimated right ventricular systolic pressure is 44.2 mmHg. Left Atrium: Left atrial size was mildly dilated. Right Atrium: Right atrial size was mildly dilated. Pericardium: A small pericardial effusion is present. Mitral Valve: The mitral valve is normal in structure. Mild mitral valve regurgitation. No evidence of mitral valve stenosis. Tricuspid Valve: The tricuspid valve is normal in structure. Tricuspid valve regurgitation is mild . No evidence of tricuspid stenosis. Aortic Valve: The aortic valve is bicuspid. Aortic valve regurgitation is not visualized. No aortic stenosis is present. Pulmonic Valve: The pulmonic valve was grossly normal. Pulmonic valve regurgitation is not visualized. No evidence of pulmonic stenosis. Aorta: The aortic root, ascending aorta and aortic arch are all structurally normal, with no evidence of  dilitation or obstruction. Venous: The inferior vena cava is dilated in size with less than 50% respiratory variability, suggesting right atrial pressure of 15 mmHg. IAS/Shunts: No atrial level shunt detected by color flow Doppler.  LEFT VENTRICLE PLAX 2D LVIDd:         6.30 cm LVIDs:         5.30 cm LV PW:         1.00 cm LV IVS:        0.90 cm LVOT diam:     2.10 cm LVOT Area:     3.46 cm  RIGHT VENTRICLE            IVC RV S prime:     9.36 cm/s  IVC diam: 2.60 cm TAPSE (M-mode): 0.8 cm LEFT ATRIUM             Index       RIGHT ATRIUM           Index LA diam:  5.00 cm 2.29 cm/m  RA Area:     19.60 cm LA Vol (A2C):   89.0 ml 40.70 ml/m RA Volume:   58.30 ml  26.66 ml/m LA Vol (A4C):   64.2 ml 29.36 ml/m LA Biplane Vol: 77.3 ml 35.35 ml/m   AORTA Ao Root diam: 2.90 cm MITRAL VALVE                TRICUSPID VALVE MV Area (PHT): 5.66 cm     TR Peak grad:   29.2 mmHg MV Decel Time: 134 msec     TR Vmax:        270.00 cm/s MR Peak grad: 62.1 mmHg MR Mean grad: 42.0 mmHg     SHUNTS MR Vmax:      394.00 cm/s   Systemic Diam: 2.10 cm MR Vmean:     308.0 cm/s MV E velocity: 119.00 cm/s MV A velocity: 33.10 cm/s MV E/A ratio:  3.60 Buford Dresser MD Electronically signed by Buford Dresser MD Signature Date/Time: 10/14/2019/4:05:44 PM    Final    US Abdomen Limited RUQ  Result Date: 10/13/2019 CLINICAL DATA:  Hyperbilirubinemia. EXAM: ULTRASOUND ABDOMEN LIMITED RIGHT UPPER QUADRANT COMPARISON:  None recent FINDINGS: Gallbladder: Surgically absent Common bile duct: Diameter: 7 mm Liver: The liver parenchyma is coarsened and heterogeneous. There is suggestion of a subtle nodular contour. There is no evidence for discrete hepatic mass. Portal vein is patent on color Doppler imaging with normal direction of blood flow towards the liver. Other: There is a small volume of abdominal ascites. IMPRESSION: 1. No acute cholecystectomy. 2. Findings suspicious for underlying cirrhosis. 3. Small volume free  fluid in the upper abdomen. Electronically Signed   By: Constance Holster M.D.   On: 10/13/2019 22:58     Labs:   Basic Metabolic Panel: Recent Labs  Lab 10/13/19 1527 10/13/19 1527 10/14/19 0540 10/14/19 0540 10/14/19 1641 10/14/19 1641 10/14/19 1646 10/14/19 1822 10/15/19 0535  NA 136  --  143  --  148*  --  143  141  --  140  K 3.5   < > 3.7   < > 2.4*   < > 2.9*  3.1*  --  3.2*  CL 96*  --  99  --   --   --   --   --  92*  CO2 29  --  29  --   --   --   --   --  31  GLUCOSE 209*  --  153*  --   --   --   --   --  135*  BUN 6  --  6  --   --   --   --   --  9  CREATININE 0.84  --  0.82  --   --   --   --  0.92 0.88  CALCIUM 9.0  --  9.3  --   --   --   --   --  9.3  MG  --   --   --   --   --   --   --   --  1.7   < > = values in this interval not displayed.   GFR Estimated Creatinine Clearance: 96.6 mL/min (by C-G formula based on SCr of 0.88 mg/dL). Liver Function Tests: Recent Labs  Lab 10/13/19 1527 10/14/19 0540 10/15/19 0535  AST 17 14* 14*  ALT 11 11 9   ALKPHOS 109 105 100  BILITOT 2.1*  2.6* 3.1*  PROT 6.1* 6.1* 6.1*  ALBUMIN 3.6 3.4* 3.4*   No results for input(s): LIPASE, AMYLASE in the last 168 hours. No results for input(s): AMMONIA in the last 168 hours. Coagulation profile No results for input(s): INR, PROTIME in the last 168 hours.  CBC: Recent Labs  Lab 10/13/19 1527 10/13/19 1527 10/14/19 0540 10/14/19 1641 10/14/19 1646 10/14/19 1822 10/15/19 0535  WBC 8.3  --  7.5  --   --  7.8 6.5  NEUTROABS  --   --  4.5  --   --   --   --   HGB 13.0   < > 13.0 11.9* 13.6  14.3 12.6 12.8  HCT 45.6   < > 44.5 35.0* 40.0  42.0 42.6 43.3  MCV 83.5  --  82.7  --   --  82.6 80.9  PLT 259  --  242  --   --  251 250   < > = values in this interval not displayed.   Cardiac Enzymes: No results for input(s): CKTOTAL, CKMB, CKMBINDEX, TROPONINI in the last 168 hours. BNP: Invalid input(s): POCBNP CBG: Recent Labs  Lab 10/14/19 0544  10/14/19 1106 10/14/19 1750 10/14/19 2052 10/15/19 0636  GLUCAP 149* 121* 137* 142* 123*   D-Dimer No results for input(s): DDIMER in the last 72 hours. Hgb A1c No results for input(s): HGBA1C in the last 72 hours. Lipid Profile Recent Labs    10/14/19 0540  CHOL 153  HDL 26*  LDLCALC 109*  TRIG 91  CHOLHDL 5.9   Thyroid function studies No results for input(s): TSH, T4TOTAL, T3FREE, THYROIDAB in the last 72 hours.  Invalid input(s): FREET3 Anemia work up No results for input(s): VITAMINB12, FOLATE, FERRITIN, TIBC, IRON, RETICCTPCT in the last 72 hours. Microbiology Recent Results (from the past 240 hour(s))  Respiratory Panel by RT PCR (Flu A&B, Covid) - Nasopharyngeal Swab     Status: None   Collection Time: 10/13/19  8:09 PM   Specimen: Nasopharyngeal Swab  Result Value Ref Range Status   SARS Coronavirus 2 by RT PCR NEGATIVE NEGATIVE Final    Comment: (NOTE) SARS-CoV-2 target nucleic acids are NOT DETECTED. The SARS-CoV-2 RNA is generally detectable in upper respiratoy specimens during the acute phase of infection. The lowest concentration of SARS-CoV-2 viral copies this assay can detect is 131 copies/mL. A negative result does not preclude SARS-Cov-2 infection and should not be used as the sole basis for treatment or other patient management decisions. A negative result may occur with  improper specimen collection/handling, submission of specimen other than nasopharyngeal swab, presence of viral mutation(s) within the areas targeted by this assay, and inadequate number of viral copies (<131 copies/mL). A negative result must be combined with clinical observations, patient history, and epidemiological information. The expected result is Negative. Fact Sheet for Patients:  https://www.moore.com/ Fact Sheet for Healthcare Providers:  https://www.young.biz/ This test is not yet ap proved or cleared by the [place] FDA and   has been authorized for detection and/or diagnosis of SARS-CoV-2 by FDA under an Emergency Use Authorization (EUA). This EUA will remain  in effect (meaning this test can be used) for the duration of the COVID-19 declaration under Section 564(b)(1) of the Act, 21 U.S.C. section 360bbb-3(b)(1), unless the authorization is terminated or revoked sooner.    Influenza A by PCR NEGATIVE NEGATIVE Final   Influenza B by PCR NEGATIVE NEGATIVE Final    Comment: (NOTE) The Xpert Xpress SARS-CoV-2/FLU/RSV assay is intended as  an aid in  the diagnosis of influenza from Nasopharyngeal swab specimens and  should not be used as a sole basis for treatment. Nasal washings and  aspirates are unacceptable for Xpert Xpress SARS-CoV-2/FLU/RSV  testing. Fact Sheet for Patients: https://www.moore.com/ Fact Sheet for Healthcare Providers: https://www.young.biz/ This test is not yet approved or cleared by the [place] FDA and  has been authorized for detection and/or diagnosis of SARS-CoV-2 by  FDA under an Emergency Use Authorization (EUA). This EUA will remain  in effect (meaning this test can be used) for the duration of the  Covid-19 declaration under Section 564(b)(1) of the Act, 21  U.S.C. section 360bbb-3(b)(1), unless the authorization is  terminated or revoked. Performed at [facility], [address] [place], [place] [address]      Discharge Instructions:   Discharge Instructions    (HEART FAILURE PATIENTS) Call MD:  Anytime you have any of the following symptoms: 1) 3 pound weight gain in 24 hours or 5 pounds in 1 week 2) shortness of breath, with or without a dry hacking cough 3) swelling in the hands, feet or stomach 4) if you have to sleep on extra pillows at night in order to breathe.   Complete by: As directed    Diet - low sodium heart healthy   Complete by: As directed    Diet Carb Modified   Complete by: As directed    Heart  Failure patients record your daily weight using the same scale at the same time of day   Complete by: As directed    Increase activity slowly   Complete by: As directed    STOP any activity that causes chest pain, shortness of breath, dizziness, sweating, or exessive weakness   Complete by: As directed      Allergies as of 10/15/2019   No Known Allergies     Medication List    TAKE these medications   aspirin EC 81 MG tablet Take 1 tablet (81 mg total) by mouth daily.   atorvastatin 80 MG tablet Commonly known as: Lipitor Take 1 tablet (80 mg total) by mouth daily.   carvedilol 3.125 MG tablet Commonly known as: Coreg Take 1 tablet (3.125 mg total) by mouth 2 (two) times daily.   digoxin 0.125 MG tablet Commonly known as: LANOXIN Take 1 tablet (0.125 mg total) by mouth daily. Start taking on: Oct 16, 2019   furosemide 20 MG tablet Commonly known as: Lasix Take 1 tablet (20 mg total) by mouth daily. Can take EXTRA 20mg  daily additional for increased LE edema   losartan 25 MG tablet Commonly known as: COZAAR Take 1 tablet (25 mg total) by mouth daily. Start taking on: Oct 16, 2019   metFORMIN 1000 MG tablet Commonly known as: GLUCOPHAGE Take 1 tablet by mouth in the morning and at bedtime.   potassium chloride SA 20 MEQ tablet Commonly known as: KLOR-CON Take 1 tablet (20 mEq total) by mouth daily.   spironolactone 25 MG tablet Commonly known as: ALDACTONE Take 0.5 tablets (12.5 mg total) by mouth daily. Start taking on: Oct 16, 2019      Follow-up Information    [facility] Follow up in 1 week(s).   Why: bmp Contact information: [address] [place] [phone]            Time coordinating discharge: 35 min  Signed:  [phone] DO  [facility] 10/15/2019, 10:58 AM

## 2019-10-15 NOTE — TOC Transition Note (Signed)
Transition of Care [facility]) - CM/SW Discharge Note   Patient Details  Name: Shelly [name] MRN: [id number] Date of Birth: March 13, 1971  Transition of Care [facility]) CM/SW Contact:  Bess Kinds, RN Phone Number: [phone] 10/15/2019, 11:33 AM   Clinical Narrative:     Spoke with patient at the bedside. Discussed MATCH letter. Preferred pharmacy not on [facility] list. Patient selected alternate pharmacy from list, [facility] in [place]. MD notified.   Confirmed current PCP in Epic. Patient is self-pay at this provider.   Patient verbalized understanding of follow up with HF clinic.   Patient verbalized having transportation home.   No further TOC needs identified.   Final next level of care: Home/Self Care Barriers to Discharge: No Barriers Identified   Patient Goals and CMS Choice        Discharge Placement                       Discharge Plan and Services                                     Social Determinants of Health (SDOH) Interventions     Readmission Risk Interventions No flowsheet data found.

## 2019-10-15 NOTE — Discharge Instructions (Signed)
Buy OTC magnesium to supplement daily

## 2019-10-15 NOTE — Progress Notes (Signed)
Advanced Heart Failure Rounding Note   Subjective:    Feels better today. Weight down 12 pounds. Denies CP or SOB. Walking halls   Cath results/films and echo images reviewed with her and her husband  She is adamant that she is going home today    Objective:   Weight Range:  Vital Signs:   Temp:  [97.4 F (36.3 C)-98.2 F (36.8 C)] 97.7 F (36.5 C) (05/08 0938) Pulse Rate:  [56-95] 81 (05/08 0938) Resp:  [13-58] 18 (05/08 0938) BP: (102-132)/(58-95) 102/64 (05/08 0938) SpO2:  [90 %-100 %] 97 % (05/08 0938) Weight:  [104.1 kg] 104.1 kg (05/08 0019) Last BM Date: 10/14/19  Weight change: Filed Weights   10/13/19 2334 10/14/19 0547 10/15/19 0019  Weight: 110.1 kg 106.4 kg 104.1 kg    Intake/Output:   Intake/Output Summary (Last 24 hours) at 10/15/2019 1014 Last data filed at 10/15/2019 0832 Gross per 24 hour  Intake 480 ml  Output 8000 ml  Net -7520 ml     Physical Exam: General:  Sitting up in bed. No resp difficulty HEENT: normal Neck: supple. JVP flat . Carotids 2+ bilat; no bruits. No lymphadenopathy or thryomegaly appreciated. Cor: PMI nondisplaced. Regular rate & rhythm. No rubs, gallops or murmurs. Lungs: clear Abdomen: soft, nontender, nondistended. No hepatosplenomegaly. No bruits or masses. Good bowel sounds. Extremities: no cyanosis, clubbing, rash, trace edema Neuro: alert & orientedx3, cranial nerves grossly intact. moves all 4 extremities w/o difficulty. Affect pleasant  Telemetry: SR 70-80s Personally reviewed   Labs: Basic Metabolic Panel: Recent Labs  Lab 10/13/19 1527 10/14/19 0540 10/14/19 1641 10/14/19 1646 10/14/19 1822 10/15/19 0535  NA 136 143 148* 143   141  --  140  K 3.5 3.7 2.4* 2.9*   3.1*  --  3.2*  CL 96* 99  --   --   --  92*  CO2 29 29  --   --   --  31  GLUCOSE 209* 153*  --   --   --  135*  BUN 6 6  --   --   --  9  CREATININE 0.84 0.82  --   --  0.92 0.88  CALCIUM 9.0 9.3  --   --   --  9.3  MG  --   --   --    --   --  1.7    Liver Function Tests: Recent Labs  Lab 10/13/19 1527 10/14/19 0540 10/15/19 0535  AST 17 14* 14*  ALT 11 11 9   ALKPHOS 109 105 100  BILITOT 2.1* 2.6* 3.1*  PROT 6.1* 6.1* 6.1*  ALBUMIN 3.6 3.4* 3.4*   No results for input(s): LIPASE, AMYLASE in the last 168 hours. No results for input(s): AMMONIA in the last 168 hours.  CBC: Recent Labs  Lab 10/13/19 1527 10/13/19 1527 10/14/19 0540 10/14/19 1641 10/14/19 1646 10/14/19 1822 10/15/19 0535  WBC 8.3  --  7.5  --   --  7.8 6.5  NEUTROABS  --   --  4.5  --   --   --   --   HGB 13.0   < > 13.0 11.9* 13.6   14.3 12.6 12.8  HCT 45.6   < > 44.5 35.0* 40.0   42.0 42.6 43.3  MCV 83.5  --  82.7  --   --  82.6 80.9  PLT 259  --  242  --   --  251 250   < > =  values in this interval not displayed.    Cardiac Enzymes: No results for input(s): CKTOTAL, CKMB, CKMBINDEX, TROPONINI in the last 168 hours.  BNP: BNP (last 3 results) Recent Labs    10/13/19 1958  BNP 1,913.7*    ProBNP (last 3 results) No results for input(s): PROBNP in the last 8760 hours.    Other results:  Imaging: DG Chest 2 View  Result Date: 10/13/2019 CLINICAL DATA:  Chest pain EXAM: CHEST - 2 VIEW COMPARISON:  08/07/2016 FINDINGS: Heart size is upper limits of normal. Mild interstitial prominence throughout both lungs without focal airspace consolidation, pleural effusion, or pneumothorax. The visualized skeletal structures are unremarkable. IMPRESSION: 1. Mild interstitial prominence throughout both lungs which may reflect edema versus atypical infection. No focal airspace consolidation. 2. Heart size upper limits of normal. Electronically Signed   By: Duanne Guess D.O.   On: 10/13/2019 15:40   CT Angio Chest PE W and/or Wo Contrast  Result Date: 10/13/2019 CLINICAL DATA:  Shortness of breath EXAM: CT ANGIOGRAPHY CHEST WITH CONTRAST TECHNIQUE: Multidetector CT imaging of the chest was performed using the standard protocol during  bolus administration of intravenous contrast. Multiplanar CT image reconstructions and MIPs were obtained to evaluate the vascular anatomy. CONTRAST:  OMNIPAQUE IOHEXOL 350 MG/ML SOLN COMPARISON:  03/03/2010 FINDINGS: Cardiovascular: Satisfactory opacification of the pulmonary arteries to the segmental level. No evidence of pulmonary embolism. Cardiomegaly. Scattered coronary artery calcifications. Trace pericardial effusion. Aortic atherosclerosis. Mediastinum/Nodes: Prominent nonspecific mediastinal and hilar lymph nodes. Thyroid gland, trachea, and esophagus demonstrate no significant findings. Lungs/Pleura: Diffuse bilateral bronchial wall thickening. No pleural effusion or pneumothorax. Upper Abdomen: No acute abnormality. Small volume ascites in the included upper abdomen. Musculoskeletal: No chest wall abnormality. No acute or significant osseous findings. Review of the MIP images confirms the above findings. IMPRESSION: 1. Negative examination for pulmonary embolism. 2. Diffuse bilateral bronchial wall thickening, consistent with nonspecific infectious or inflammatory bronchitis. 3. Prominent nonspecific mediastinal and hilar lymph nodes, likely reactive. 4. Cardiomegaly and trace pericardial effusion. 5. Scattered coronary artery calcifications. 6. Small volume ascites in the included upper abdomen. 7. Aortic Atherosclerosis (ICD10-I70.0). Electronically Signed   By: Lauralyn Primes M.D.   On: 10/13/2019 20:50   CARDIAC CATHETERIZATION  Result Date: 10/14/2019  Prox RCA to Mid RCA lesion is 40% stenosed.  Dist RCA lesion is 50% stenosed.  RPAV lesion is 30% stenosed.  Prox Cx to Mid Cx lesion is 40% stenosed.  3rd Mrg lesion is 20% stenosed.  Mid LAD to Dist LAD lesion is 50% stenosed.  Findings: Ao = 106/75 (87) LV = 107/27 RA = 15 RV = 54/16 PA = 57/25 (40) PCW = 27 (v = 37) Fick cardiac output/index = 4.4/2.0 PVR = 1.4 WU SVR = 1297 Ao sat = 99% PA sat = 62%, 63% Assessment: 1. CAD with  diffuse non-obstructive disease and chronic dissection of mid LAD 2. 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 Elevated filling pressures with moderately reduced output Plan/Discussion: Diurese. Titrate HF meds. Ideally will need cMRI to look for degree of LAD scar. Arvilla Meres, MD 5:23 PM   ECHOCARDIOGRAM COMPLETE  Result Date: 10/14/2019    ECHOCARDIOGRAM REPORT   Patient Name:   Shelly [name] Date of Exam: 10/14/2019 Medical Rec #:  [id number]     Height:       68.0 in Accession #:    [id number]    Weight:       234.5 lb Date of Birth:  1971-02-14  BSA:          2.187 m Patient Age:    49 years      BP:           112/77 mmHg Patient Gender: F             HR:           85 bpm. Exam Location:  Inpatient Procedure: 2D Echo Indications:    chest pain786.50  History:        Patient has no prior history of Echocardiogram examinations.                 CHF; Risk Factors:Current Smoker and Diabetes.  Sonographer:    Delcie Roch Referring Phys: [id number] TIMOTHY S OPYD IMPRESSIONS  1. Left ventricular ejection fraction, by estimation, is 20 to 25%. The left ventricle has severely decreased function. The left ventricle demonstrates global hypokinesis. The left ventricular internal cavity size was severely dilated. Left ventricular diastolic function could not be evaluated.  2. Right ventricular systolic function is severely reduced. The right ventricular size is mildly enlarged. There is mildly elevated pulmonary artery systolic pressure.  3. Left atrial size was mildly dilated.  4. Right atrial size was mildly dilated.  5. The mitral valve is normal in structure. Mild mitral valve regurgitation. No evidence of mitral stenosis.  6. The aortic valve is bicuspid. Aortic valve regurgitation is not visualized. No aortic stenosis is present.  7. The inferior vena cava is dilated in size with <50% respiratory variability, suggesting right atrial pressure of 15 mmHg. Comparison(s): No prior Echocardiogram.  Conclusion(s)/Recommendation(s): Severe biventricular failure. FINDINGS  Left Ventricle: Global hypokinesis, worst at the septal, inferior, and anterior walls. Slightly more mild hypokinesis of the base to mid anterolateral and inferolateral wall. Left ventricular ejection fraction, by estimation, is 20 to 25%. The left ventricle has severely decreased function. The left ventricle demonstrates global hypokinesis. The left ventricular internal cavity size was severely dilated. There is no left ventricular hypertrophy. Left ventricular diastolic function could not be evaluated due to nondiagnostic images. Left ventricular diastolic function could not be evaluated. Right Ventricle: The right ventricular size is mildly enlarged. No increase in right ventricular wall thickness. Right ventricular systolic function is severely reduced. There is mildly elevated pulmonary artery systolic pressure. The tricuspid regurgitant velocity is 2.70 m/s, and with an assumed right atrial pressure of 15 mmHg, the estimated right ventricular systolic pressure is 44.2 mmHg. Left Atrium: Left atrial size was mildly dilated. Right Atrium: Right atrial size was mildly dilated. Pericardium: A small pericardial effusion is present. Mitral Valve: The mitral valve is normal in structure. Mild mitral valve regurgitation. No evidence of mitral valve stenosis. Tricuspid Valve: The tricuspid valve is normal in structure. Tricuspid valve regurgitation is mild . No evidence of tricuspid stenosis. Aortic Valve: The aortic valve is bicuspid. Aortic valve regurgitation is not visualized. No aortic stenosis is present. Pulmonic Valve: The pulmonic valve was grossly normal. Pulmonic valve regurgitation is not visualized. No evidence of pulmonic stenosis. Aorta: The aortic root, ascending aorta and aortic arch are all structurally normal, with no evidence of dilitation or obstruction. Venous: The inferior vena cava is dilated in size with less than 50%  respiratory variability, suggesting right atrial pressure of 15 mmHg. IAS/Shunts: No atrial level shunt detected by color flow Doppler.  LEFT VENTRICLE PLAX 2D LVIDd:         6.30 cm LVIDs:         5.30 cm  LV PW:         1.00 cm LV IVS:        0.90 cm LVOT diam:     2.10 cm LVOT Area:     3.46 cm  RIGHT VENTRICLE            IVC RV S prime:     9.36 cm/s  IVC diam: 2.60 cm TAPSE (M-mode): 0.8 cm LEFT ATRIUM             Index       RIGHT ATRIUM           Index LA diam:        5.00 cm 2.29 cm/m  RA Area:     19.60 cm LA Vol (A2C):   89.0 ml 40.70 ml/m RA Volume:   58.30 ml  26.66 ml/m LA Vol (A4C):   64.2 ml 29.36 ml/m LA Biplane Vol: 77.3 ml 35.35 ml/m   AORTA Ao Root diam: 2.90 cm MITRAL VALVE                TRICUSPID VALVE MV Area (PHT): 5.66 cm     TR Peak grad:   29.2 mmHg MV Decel Time: 134 msec     TR Vmax:        270.00 cm/s MR Peak grad: 62.1 mmHg MR Mean grad: 42.0 mmHg     SHUNTS MR Vmax:      394.00 cm/s   Systemic Diam: 2.10 cm MR Vmean:     308.0 cm/s MV E velocity: 119.00 cm/s MV A velocity: 33.10 cm/s MV E/A ratio:  3.60 Buford Dresser MD Electronically signed by Buford Dresser MD Signature Date/Time: 10/14/2019/4:05:44 PM    Final    US Abdomen Limited RUQ  Result Date: 10/13/2019 CLINICAL DATA:  Hyperbilirubinemia. EXAM: ULTRASOUND ABDOMEN LIMITED RIGHT UPPER QUADRANT COMPARISON:  None recent FINDINGS: Gallbladder: Surgically absent Common bile duct: Diameter: 7 mm Liver: The liver parenchyma is coarsened and heterogeneous. There is suggestion of a subtle nodular contour. There is no evidence for discrete hepatic mass. Portal vein is patent on color Doppler imaging with normal direction of blood flow towards the liver. Other: There is a small volume of abdominal ascites. IMPRESSION: 1. No acute cholecystectomy. 2. Findings suspicious for underlying cirrhosis. 3. Small volume free fluid in the upper abdomen. Electronically Signed   By: Constance Holster M.D.   On: 10/13/2019  22:58      Medications:     Scheduled Medications:  ALPRAZolam  0.5 mg Oral Once   aspirin  81 mg Oral Daily   digoxin  0.125 mg Oral Daily   enoxaparin (LOVENOX) injection  40 mg Subcutaneous Q24H   furosemide  80 mg Intravenous Q12H   insulin aspart  0-5 Units Subcutaneous QHS   insulin aspart  0-9 Units Subcutaneous TID WC   losartan  25 mg Oral Daily   potassium chloride  40 mEq Oral BID   potassium chloride  40 mEq Oral Once   potassium chloride  40 mEq Oral Once   sodium chloride flush  3 mL Intravenous Once   sodium chloride flush  3 mL Intravenous Q12H   sodium chloride flush  3 mL Intravenous Q12H   sodium chloride flush  3 mL Intravenous Q12H   spironolactone  12.5 mg Oral Daily     Infusions:  sodium chloride     sodium chloride       PRN Medications:  sodium chloride, sodium chloride, acetaminophen, acetaminophen,  ondansetron (ZOFRAN) IV, sodium chloride flush, sodium chloride flush   Assessment/Plan:   1. Acute systolic HF  - ischemic CM EF 09-81% 2. CAD  - cath with diffuse non-obstructive CAD with chronic dissection/recannalization of mid LAD 3. DM 4. Tobacco use 5. Hyperkalemia  Symptomatically she is much better after diuresis. Based on results of cath and negative troponins suspect she had an out of hospital MI at some point with recannalization of her LAD. No interventional targets currently. As EF seems down out of proportion to cath findings, I have suggested cMRI on Monday to understand degree of infarct/vaiability. However she says she is going home today and wants to follow up as outpatient. I explained the risks inherent with this to her and her husband including worsening HF and sudden cardiac death. She understands and wants to go home.   Will supp K this am and send home on  ECASA 81 Atorva 80 qhs Spironolactone 12.5mg  daily Carvedilol 3.125 bid Losartan 25 qhs  Lasix 20 daily - can take extra as needed Kcl 20  daily Digoxin 0.125 daily Metformin   Will not add Plavix or Jardiance at this time due to insurance reasons.  Will arrange f/u in HF Clinic for next week and help with other meds as she has no insurance.   Length of Stay: 1   Arvilla Meres MD 10/15/2019, 10:14 AM  Advanced Heart Failure Team Pager [phone] (M-F; 7a - 4p)  Please contact [facility] for night-coverage after hours (4p -7a ) and weekends on [email or web address]

## 2019-10-15 NOTE — Plan of Care (Signed)

## 2019-10-19 ENCOUNTER — Telehealth (HOSPITAL_COMMUNITY): Payer: Self-pay | Admitting: Vascular Surgery

## 2019-10-19 NOTE — Telephone Encounter (Signed)
Left pt Vm giving 10/24/19 @ 3:30 appt W/ APP, Asked pt to call back to confirm

## 2019-10-24 ENCOUNTER — Encounter (HOSPITAL_COMMUNITY): Payer: Self-pay

## 2019-11-09 ENCOUNTER — Other Ambulatory Visit (HOSPITAL_COMMUNITY): Payer: Self-pay | Admitting: *Deleted

## 2019-11-09 MED ORDER — DIGOXIN 125 MCG PO TABS: 0.1250 mg | ORAL_TABLET | Freq: Every day | ORAL | 0 refills | Status: DC

## 2019-11-09 MED ORDER — ATORVASTATIN CALCIUM 80 MG PO TABS: 80.0000 mg | ORAL_TABLET | Freq: Every day | ORAL | 0 refills | Status: DC

## 2019-11-09 MED ORDER — CARVEDILOL 3.125 MG PO TABS: 3.1250 mg | ORAL_TABLET | Freq: Two times a day (BID) | ORAL | 0 refills | Status: DC

## 2019-11-09 MED ORDER — SPIRONOLACTONE 25 MG PO TABS: 12.5000 mg | ORAL_TABLET | Freq: Every day | ORAL | 0 refills | Status: DC

## 2019-11-09 MED ORDER — FUROSEMIDE 20 MG PO TABS: 20.0000 mg | ORAL_TABLET | Freq: Every day | ORAL | 0 refills | Status: DC

## 2019-11-09 MED ORDER — POTASSIUM CHLORIDE CRYS ER 20 MEQ PO TBCR: 20.0000 meq | EXTENDED_RELEASE_TABLET | Freq: Every day | ORAL | 0 refills | Status: DC

## 2019-11-14 ENCOUNTER — Other Ambulatory Visit: Payer: Self-pay

## 2019-11-14 ENCOUNTER — Encounter (HOSPITAL_COMMUNITY): Payer: Self-pay

## 2019-11-14 ENCOUNTER — Ambulatory Visit (HOSPITAL_COMMUNITY)
Admission: RE | Admit: 2019-11-14 | Discharge: 2019-11-14 | Disposition: A | Discharge status: Home / Self Care | Payer: Self-pay | Source: Ambulatory Visit | Attending: Internal Medicine | Admitting: Internal Medicine

## 2019-11-14 VITALS — BP 138/80 | HR 86 | Wt 209.2 lb

## 2019-11-14 DIAGNOSIS — I2510 Atherosclerotic heart disease of native coronary artery without angina pectoris: Secondary | ICD-10-CM | POA: Insufficient documentation

## 2019-11-14 DIAGNOSIS — Z7982 Long term (current) use of aspirin: Secondary | ICD-10-CM | POA: Insufficient documentation

## 2019-11-14 DIAGNOSIS — Z79899 Other long term (current) drug therapy: Secondary | ICD-10-CM | POA: Insufficient documentation

## 2019-11-14 DIAGNOSIS — Z639 Problem related to primary support group, unspecified: Secondary | ICD-10-CM | POA: Insufficient documentation

## 2019-11-14 DIAGNOSIS — F17210 Nicotine dependence, cigarettes, uncomplicated: Secondary | ICD-10-CM | POA: Insufficient documentation

## 2019-11-14 DIAGNOSIS — Z8249 Family history of ischemic heart disease and other diseases of the circulatory system: Secondary | ICD-10-CM | POA: Insufficient documentation

## 2019-11-14 DIAGNOSIS — I5022 Chronic systolic (congestive) heart failure: Secondary | ICD-10-CM | POA: Insufficient documentation

## 2019-11-14 DIAGNOSIS — I428 Other cardiomyopathies: Secondary | ICD-10-CM | POA: Insufficient documentation

## 2019-11-14 DIAGNOSIS — E119 Type 2 diabetes mellitus without complications: Secondary | ICD-10-CM | POA: Insufficient documentation

## 2019-11-14 DIAGNOSIS — Z7984 Long term (current) use of oral hypoglycemic drugs: Secondary | ICD-10-CM | POA: Insufficient documentation

## 2019-11-14 DIAGNOSIS — F419 Anxiety disorder, unspecified: Secondary | ICD-10-CM | POA: Insufficient documentation

## 2019-11-14 LAB — BASIC METABOLIC PANEL
Anion gap: 9 (ref 5–15)
BUN: 11 mg/dL (ref 6–20)
CO2: 26 mmol/L (ref 22–32)
Calcium: 9.6 mg/dL (ref 8.9–10.3)
Chloride: 100 mmol/L (ref 98–111)
Creatinine, Ser: 0.74 mg/dL (ref 0.44–1.00)
GFR calc Af Amer: >60 mL/min (ref >60)
GFR calc non Af Amer: >60 mL/min (ref >60)
Glucose, Bld: 297 mg/dL — ABNORMAL HIGH (ref 70–99)
Potassium: 4.9 mmol/L (ref 3.5–5.1)
Sodium: 135 mmol/L (ref 135–145)

## 2019-11-14 LAB — DIGOXIN LEVEL: Digoxin Level: 0.4 ng/mL — ABNORMAL LOW (ref 0.8–2.0)

## 2019-11-14 MED ORDER — ENTRESTO 24-26 MG PO TABS
1.0000 | ORAL_TABLET | Freq: Two times a day (BID) | ORAL | 2 refills | Status: DC
Start: 2019-11-14 — End: 2019-12-14

## 2019-11-14 MED ORDER — ALPRAZOLAM 0.25 MG PO TABS
0.2500 mg | ORAL_TABLET | Freq: Three times a day (TID) | ORAL | 0 refills | Status: DC | PRN
Start: 2019-11-14 — End: 2020-01-12

## 2019-11-14 NOTE — Progress Notes (Signed)
Medication Samples have been provided to the patient.  Drug name: ENTRESTO       Strength: 24/26MG         Qty: 28  LOT: [id number]  Exp.Date: 01/2021  Dosing instructions: ONE TAB TWICE DAILY  The patient has been instructed regarding the correct time, dose, and frequency of taking this medication, including desired effects and most common side effects.   Theresia Bough 10:28 AM 11/14/2019

## 2019-11-14 NOTE — Patient Instructions (Signed)
STOP Losartan START Entresto 24/26 mg, one tab twice daily  Labs today We will only contact you if something comes back abnormal or we need to make some changes. Otherwise no news is good news!  Labs needed on one week and four weeks  Your physician has requested that you have a cardiac MRI. Cardiac MRI uses a computer to create images of your heart as its beating, producing both still and moving pictures of your heart and major blood vessels. For further information please visit [email or web address]. Please follow the instruction sheet given to you today for more information.  Your physician recommends that you schedule a follow-up appointment in: 4 weeks with Lauren K,Pharm D  Do the following things EVERYDAY: 1) Weigh yourself in the morning before breakfast. Write it down and keep it in a log. 2) Take your medicines as prescribed 3) Eat low salt foods--Limit salt (sodium) to 2000 mg per day.  4) Stay as active as you can everyday 5) Limit all fluids for the day to less than 2 liters   At the [facility], you and your health needs are our priority. As part of our continuing mission to provide you with exceptional heart care, we have created designated Provider Care Teams. These Care Teams include your primary Cardiologist (physician) and Advanced Practice Providers (APPs- Physician Assistants and Nurse Practitioners) who all work together to provide you with the care you need, when you need it.   You may see any of the following providers on your designated Care Team at your next follow up: Marland Kitchen Dr Arvilla Meres . Dr Marca Ancona . Tonye Becket, NP . Robbie Lis, PA . Karle Plumber, PharmD   Please be sure to bring in all your medications bottles to every appointment.

## 2019-11-14 NOTE — Progress Notes (Signed)
Advanced Heart Failure Clinic Note   Referring Physician: PCP: [facility], [facility] PCP-Cardiologist: Dr. Irish Lack Dr. Haroldine Laws   HPI:  Mrs Burel is a 49 year old with history of recently diagnosed diabetes and tobacco abuse, admitted to [facility] 5/21 w/ progressive SOB, LEE and intermittent CP and found to be in acute CHF w/ marked volume overload. BNP 1913, HS Trop 14>13, negative COVID, HIV NR. Started on IV lasix for diuresis. ECG with possible small anterolateral Qs waves. Echo EF 20% with severe RV dysfunction. R/LHC showed diffuse non-obstructive CAD with chronic dissection/recannalization of mid LAD. Suspect she had an out of hospital MI at some point.  There were no interventional targets. EF out of proportion to cath findings. GDMT was started. It was advised that she stay for cMRI to understand degree of infarct/vaiability, however she refused to stay for additional inpatient w/u. Outpatient cMRI recommended. This has not been done yet due to lack of insurance. She is working on Artist. New coverage will take effect July 1.   She presents to clinic today for f/u. Here w/ her husband. Much improved. Feels well. Euvolemic on exam. No CP. Denies resting dyspnea. No orthopnea/ PND. She has mild dyspnea w/ moderate activities. BP stable. Tolerating meds ok w/o side effects. She is asking for med assistance to help cover refills until her insurance kicks in. She is still smoking but has cut down from 1ppd>>1/2ppd. She reports being under a lot of stress. Very stressful job and family issues. Has family court hearing next week, she is requesting PRN meds for anxiety.     Echo 5/21: EF 20% with severe RV dysfunction.  R/LHC 5/21 Conclusion    Prox RCA to Mid RCA lesion is 40% stenosed.  Dist RCA lesion is 50% stenosed.  RPAV lesion is 30% stenosed.  Prox Cx to Mid Cx lesion is 40% stenosed.  3rd Mrg lesion is 20% stenosed.  Mid LAD to Dist LAD lesion is  50% stenosed.   Findings:  Ao = 106/75 (87) LV = 107/27 RA = 15 RV = 54/16 PA = 57/25 (40) PCW = 27 (v = 37) Fick cardiac output/index = 4.4/2.0 PVR = 1.4 WU SVR = 1297 Ao sat = 99% PA sat = 62%, 63%  Assessment: 1. CAD with diffuse non-obstructive disease and chronic dissection of mid LAD 2. 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 Elevated filling pressures with moderately reduced output  Plan/Discussion:  Diurese. Titrate HF meds. Ideally will need cMRI to look for degree of LAD scar.      Review of Systems: [y] = yes, [ ]  = no   General: Weight gain [ ] ; Weight loss [ ] ; Anorexia [ ] ; Fatigue [ ] ; Fever [ ] ; Chills [ ] ; Weakness [ ]   Cardiac: Chest pain/pressure [ ] ; Resting SOB [ ] ; Exertional SOB [ ] ; Orthopnea [ ] ; Pedal Edema [ ] ; Palpitations [ ] ; Syncope [ ] ; Presyncope [ ] ; Paroxysmal nocturnal dyspnea[ ]   Pulmonary: Cough [ ] ; Wheezing[ ] ; Hemoptysis[ ] ; Sputum [ ] ; Snoring [ ]   GI: Vomiting[ ] ; Dysphagia[ ] ; Melena[ ] ; Hematochezia [ ] ; Heartburn[ ] ; Abdominal pain [ ] ; Constipation [ ] ; Diarrhea [ ] ; BRBPR [ ]   GU: Hematuria[ ] ; Dysuria [ ] ; Nocturia[ ]   Vascular: Pain in legs with walking [ ] ; Pain in feet with lying flat [ ] ; Non-healing sores [ ] ; Stroke [ ] ; TIA [ ] ; Slurred speech [ ] ;  Neuro: Headaches[ ] ; Vertigo[ ] ; Seizures[ ] ;  Paresthesias[ ] ;Blurred vision [ ] ; Diplopia [ ] ; Vision changes [ ]   Ortho/Skin: Arthritis [ ] ; Joint pain [ ] ; Muscle pain [ ] ; Joint swelling [ ] ; Back Pain [ ] ; Rash [ ]   Psych: Depression[ ] ; Anxiety[ ]   Heme: Bleeding problems [ ] ; Clotting disorders [ ] ; Anemia [ ]   Endocrine: Diabetes [ ] ; Thyroid dysfunction[ ]    Past Medical History:  Diagnosis Date  . Diabetes mellitus without complication (HCC)   . Tobacco abuse     Current Outpatient Medications  Medication Sig Dispense Refill  . aspirin EC 81 MG tablet Take 1 tablet (81 mg total) by mouth daily. 150 tablet 2  . atorvastatin (LIPITOR) 80 MG tablet  Take 1 tablet (80 mg total) by mouth daily. 30 tablet 0  . carvedilol (COREG) 3.125 MG tablet Take 1 tablet (3.125 mg total) by mouth 2 (two) times daily. 60 tablet 0  . digoxin (LANOXIN) 0.125 MG tablet Take 1 tablet (0.125 mg total) by mouth daily. 30 tablet 0  . furosemide (LASIX) 20 MG tablet Take 1 tablet (20 mg total) by mouth daily. Can take EXTRA 20mg  daily additional for increased LE edema 45 tablet 0  . losartan (COZAAR) 25 MG tablet Take 1 tablet (25 mg total) by mouth daily. 30 tablet 0  . metFORMIN (GLUCOPHAGE) 1000 MG tablet Take 1 tablet by mouth in the morning and at bedtime.    . potassium chloride SA (KLOR-CON) 20 MEQ tablet Take 1 tablet (20 mEq total) by mouth daily. 30 tablet 0  . spironolactone (ALDACTONE) 25 MG tablet Take 0.5 tablets (12.5 mg total) by mouth daily. 15 tablet 0   No current facility-administered medications for this encounter.    No Known Allergies    Social History   Socioeconomic History  . Marital status: Married    Spouse name: Not on file  . Number of children: Not on file  . Years of education: Not on file  . Highest education level: Not on file  Occupational History  . Not on file  Tobacco Use  . Smoking status: Current Every Day Smoker  . Smokeless tobacco: Never Used  Substance and Sexual Activity  . Alcohol use: Yes  . Drug use: Never  . Sexual activity: Not on file  Other Topics Concern  . Not on file  Social History Narrative  . Not on file   Social Determinants of Health   Financial Resource Strain:   . Difficulty of Paying Living Expenses:   Food Insecurity:   . Worried About in the Last Year:   . in the Last Year:   Transportation Needs:   . (Medical):   Lack of Transportation (Non-Medical):   Physical Activity:   . Days of Exercise per Week:   . Minutes of Exercise per Session:   Stress:   . Feeling of Stress :   Social Connections:   . Frequency of  Communication with Friends and Family:   . Frequency of Social Gatherings with Friends and Family:   . Attends Religious Services:   . Active Member of Clubs or Organizations:   . Attends Meetings:   Marital Status:   Intimate Partner Violence:   . Fear of Current or Ex-Partner:   . Emotionally Abused:   Physically Abused:   . Sexually Abused:       Family History  Problem Relation Age of Onset  .  Sudden Cardiac Death Mother   . CAD Father   . CAD Paternal Grandmother   . CAD Paternal Grandfather     Vitals:   11/14/19 0920  BP: 138/80  Pulse: 86  SpO2: 96%  Weight: 94.9 kg (209 lb 3.2 oz)     PHYSICAL EXAM: General:  Well appearing. No respiratory difficulty HEENT: normal Neck: supple. no JVD. Carotids 2+ bilat; no bruits. No lymphadenopathy or thyromegaly appreciated. Cor: PMI nondisplaced. Regular rate & rhythm. No rubs, gallops or murmurs. Lungs: clear Abdomen: soft, nontender, nondistended. No hepatosplenomegaly. No bruits or masses. Good bowel sounds. Extremities: no cyanosis, clubbing, rash, edema Neuro: alert & oriented x 3, cranial nerves grossly intact. moves all 4 extremities w/o difficulty. Affect pleasant.  ECG:  Not performed.    ASSESSMENT & PLAN:  1. Chronic Systolic Heart Failure: - new diagnosis. Echo 5/21 w/ EF 20% with severe RV dysfunction. R/LHC showed diffuse non-obstructive CAD with chronic dissection/recannalization of mid LAD. Suspect she had an out of hospital MI at some point.  There were no interventional targets. EF out of proportion to cath findings.  - will order cMRI to assess degree of infarct/vaiability. Plan after July 1, once insurance takes effect - continue GDTM, w/ gradual titration q3-4 weeks. Will need repeat Echo in 3 months + EP referral for potential ICD if EF remains < 35%.  - stop Losartan and start Entresto 24-26 bid.  - continue Spiro 12.5 mg daily - Continue Coreg 3.125 mg bid - Continue  digoxin 0.125 mg daily. Check dig level today  - Continue Lasix 20 mg daily, + KCl 20 meq daily  - Plan future SGLT2i  - Check BMP today and again in 7 days - she was provided med refills through HF fund and free 30 day Entresto card    2. CAD: - LHC 5/21 showed diffuse non-obstructive CAD with chronic dissection/recannalization of mid LAD. Suspect she had an out of hospital MI at some point.  There were no interventional targets. - stable w/o CP - continue medical therapy w/ ASA/statin and  blocker - LDL goal <70 (109 on recent FLP). Continue atorvastatin 80 qhs. Repeat FLP and HFTs in 4 more weeks - complete smoking cessation strongly advised.   3. Type 2 DM: management per PCP - continue Metformin - will try to add SGLT2i soon   4. Tobacco Abuse: still smoking but has cut back from 1 ppd>>1/2 ppd - discussed importance of complete cessation - we discussed cessation aids, but she would like to continue to quit on her own   5. Anxiety: She reports being under a lot of stress. Very stressful job and family issues. Has family court hearing next week, she is requesting PRN meds for anxiety.  - will provide Rx for Xanax 0.25 tid PRN # 6 tablets. No refills.   F/u for repeat BMP in 1 week. F/u w/ pharmD in 3-4 weeks for further med titration.   Robbie Lis, PA-C 11/14/19

## 2019-11-14 NOTE — Progress Notes (Signed)
CSW consulted to meet with pt concerning lack of insurance and medication cost concerns.  Pt currently uninsured but states she has purchased insurance through the [facility] which will become active on July 1st- has already paid her premium so should be no barriers to insurance starting at that time.  Pt provided samples of Entresto and $10copay card for when her insurance becomes active but CSW also completed [employer] app with her in case there is a delay in insurance for some reason- patient portion completed and physician portion left for MD to review and sign.  pts other medications were sent to [facility] pharmacy so she could get through the HF fund until her insurance becomes active.  Pt reports no other concerns or needs at this time  Burna Sis, LCSW Clinical Social Worker Advanced Heart Failure Clinic Desk#: [phone] Cell#: [phone]

## 2019-11-18 ENCOUNTER — Telehealth (HOSPITAL_COMMUNITY): Payer: Self-pay | Admitting: Pharmacy Technician

## 2019-11-18 NOTE — Telephone Encounter (Signed)
Advanced Heart Failure Patient Advocate Encounter   Patient was approved to receive Entresto from [employer]  Patient ID: [id number] Effective dates: 11/16/19 through 11/15/20  I have spoken with the patient. She has already spoken with [employer]. They are sending her a 90 day supply. Explained that we will not use [employer] after this time unless there are unforseen issues when her commercial insurance kicks in on July 1st. She is going to call me sometime in July so that I can check on that.   At that time we will see if a PA is needed and what the co-pay is. She already has a co-pay card and will activate it after we find that information out.    Archer Asa, CPhT

## 2019-11-21 ENCOUNTER — Other Ambulatory Visit: Payer: Self-pay

## 2019-11-21 ENCOUNTER — Ambulatory Visit (HOSPITAL_COMMUNITY)
Admission: RE | Admit: 2019-11-21 | Discharge: 2019-11-21 | Disposition: A | Discharge status: Home / Self Care | Payer: Self-pay | Source: Ambulatory Visit | Attending: Internal Medicine | Admitting: Internal Medicine

## 2019-11-21 DIAGNOSIS — I5022 Chronic systolic (congestive) heart failure: Secondary | ICD-10-CM | POA: Insufficient documentation

## 2019-11-21 LAB — HEPATIC FUNCTION PANEL
ALT: 36 U/L (ref 0–44)
AST: 23 U/L (ref 15–41)
Albumin: 3.9 g/dL (ref 3.5–5.0)
Alkaline Phosphatase: 111 U/L (ref 38–126)
Bilirubin, Direct: 0.3 mg/dL — ABNORMAL HIGH (ref 0.0–0.2)
Indirect Bilirubin: 1.6 mg/dL — ABNORMAL HIGH (ref 0.3–0.9)
Total Bilirubin: 1.9 mg/dL — ABNORMAL HIGH (ref 0.3–1.2)
Total Protein: 7.1 g/dL (ref 6.5–8.1)

## 2019-11-21 LAB — LIPID PANEL
Cholesterol: 167 mg/dL (ref 0–200)
HDL: 36 mg/dL — ABNORMAL LOW (ref >40)
LDL Cholesterol: 98 mg/dL (ref 0–99)
Total CHOL/HDL Ratio: 4.6 RATIO
Triglycerides: 167 mg/dL — ABNORMAL HIGH (ref <150)
VLDL: 33 mg/dL (ref 0–40)

## 2019-11-21 MED FILL — ENTRESTO 24 MG-26 MG TABLET: 24-26 | 30 days supply | Qty: 60 | Fill #0

## 2019-12-05 NOTE — Progress Notes (Signed)
PCP: [facility] PCP-Cardiologist: Dr. Eldridge Dace Dr. Gala Romney   HPI:  Shelly [name] is a 49 year old with history of recently diagnosed diabetes and tobacco abuse, admitted to [facility] 5/21 with progressive SOB, LEE and intermittent CP and found to be in acute CHF with marked volume overload. BNP 1913, HS Trop 14>13, negative COVID, HIV NR.  Started on IV furosemide  for diuresis. ECG with possible small anterolateral Qs waves. Echo showed EF 20% with severe RV dysfunction.R/LHC showed diffuse non-obstructive CAD with chronic dissection/recannalization of mid LAD. Suspect she had an out of hospital MI at some point.  There were no interventional targets. EF out of proportion to cath findings. GDMT was started. It was advised that she stay for cMRI to understand degree of infarct/vaiability, however she refused to stay for additional inpatient workup. Outpatient cMRI recommended. This has not been done yet due to lack of insurance. She is working on Museum/gallery conservator. New coverage will take effect July 1.   She recently presented to HF Clinic for follow up with Robbie Lis, PA-C on 11/14/19. Came with her husband. Was feeling much improved. Euvolemic on exam. No CP. Denied resting dyspnea. She had mild dyspnea w/ moderate activities. No orthopnea/PND. BP was stable. Reported tolerating medication ok without side effects.  She was still smoking but had cut down from 1 ppd>> 1/2 ppd. She reported being under a lot of stress. Very stressful job and family issues. Had family court hearing the following week. She was requesting PRN meds for anxiety.   Today she returns to HF clinic for pharmacist medication titration. At last visit with PA-C, losartan was discontinued and Entresto 24/26 mg BID was initiated. Overall she is feeling well today. She still gets tired by the end of the day but this is getting better. Does have some dizziness upon standing but this is stable and has not  worsened since the [place] was started. No SOB/DOE. Can walk the length of the [facility] before needing to rest. Believes her exercise tolerance is improving.  She weighs herself daily at home. Her weight is normally 206-209 lbs. Weight did increase to 211 lbs over the holiday weekend, but is downtrending now. She takes furosemide 20 mg daily and has not needed any extra. No LEE, PND or orthopnea. Her appetite is good. She states that she ate more salty foods than normal over the holiday weekend (hamburgers, hot dogs) but is trying to get back to her routine now. Taking all medications as prescribed and tolerating all medications. She now has prescription insurance as of 12/08/19.     HF Medications: Carvedilol 3.125 mg BID Entresto 24/26 mg BID Spironolactone 12.5 mg daily Digoxin 0.125 mg daily Furosemide 20 mg daily Potassium 20 mEq daily  Has the patient been experiencing any side effects to the medications prescribed?  no  Does the patient have any problems obtaining medications due to transportation or finances?   No - now has [employer]. Was previously approved for [employer] patient assistance for [employer] before she obtained [employer]. Will work on completing prior authorization for [employer] for her new insurance. I gave her Sherryll Burger and Jardiance copay cards today.   Understanding of regimen: good Understanding of indications: good Potential of compliance: good Patient understands to avoid NSAIDs. Patient understands to avoid decongestants.    Pertinent Lab Values: . Serum creatinine 0.73, BUN 7, Potassium 4.5, Sodium 135, BNP 357.1 pg/mL  Vital Signs: . Weight: 209.6 lbs (last  clinic weight: 209.2 lbs) . Blood pressure: 104/38  . Heart rate: 80   Assessment: 1. Chronic Systolic Heart Failure: - new diagnosis. Echo 10/2019 w/ EF 20% with severe RV dysfunction.R/LHC showed diffuse non-obstructive CAD with chronic  dissection/recannalization of mid LAD. Suspect she had an out of hospital MI at some point.  There were no interventional targets. EF out of proportion to cath findings.  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  - NYHA class II, euvolemic on exam - Vitals: BP 104/68, HR 80 - Labs: Scr stable at 0.73, K 4.5. BNP improved from 1913.7 pg/mL on 10/13/19 to 357.1 pg/mL today.  - Continue furosemide 20 mg daily and KCl 20 meq daily.  - Continue carvedilol 3.125 mg BID.  - Continue Entresto 24-26 mg BID.   - Continue spironolactone 12.5 mg daily - Start empagliflozin (Jardiance) 10 mg daily. Repeat BMET in 4 weeks.  - Continue digoxin 0.125 mg daily. Last digoxin level WNL at 0.4 ng/mL on 11/14/19.    - Continue GDMT, with gradual titration q3-4 weeks. Will need repeat Echo in 3 months + EP referral for potential ICD if EF remains < 35%.    2. CAD: - LHC 5/21 showed diffuse non-obstructive CAD with chronic dissection/recannalization of mid LAD. Suspect she had an out of hospital MI at some point.  There were no interventional targets. - Stable without CP - Continue medical therapy with ASA/statin and ? blocker - LDL goal <70 (98 on recent FLP).  - Continue atorvastatin 80 qhs.  - Complete smoking cessation strongly advised.   3. Type 2 DM: management per PCP - continue Metformin - Start empagliflozin as above  4. Tobacco Abuse: still smoking but has cut back from 1 ppd >> 1/2 ppd - Ddiscussed importance of complete cessation - We discussed cessation aids, but she would like to continue to quit on her own.     Plan: 1) Medication changes: Based on clinical presentation, vital signs and recent labs will start empagliflozin (Jardiance) 10 mg daily. 2) Labs: Scr 0.73, K 4.5 3) Follow-up: 4 weeks with Pharmacy Clinic   Karle Plumber, PharmD, BCPS, BCCP, CPP Heart Failure Clinic Pharmacist [phone]

## 2019-12-14 ENCOUNTER — Ambulatory Visit (HOSPITAL_COMMUNITY)
Admission: RE | Admit: 2019-12-14 | Discharge: 2019-12-14 | Disposition: A | Discharge status: Home / Self Care | Payer: 59 | Source: Ambulatory Visit | Attending: Internal Medicine | Admitting: Internal Medicine

## 2019-12-14 ENCOUNTER — Other Ambulatory Visit: Payer: Self-pay

## 2019-12-14 ENCOUNTER — Telehealth (HOSPITAL_COMMUNITY): Payer: Self-pay | Admitting: Pharmacy Technician

## 2019-12-14 VITALS — BP 104/68 | HR 80 | Wt 209.6 lb

## 2019-12-14 DIAGNOSIS — I5022 Chronic systolic (congestive) heart failure: Secondary | ICD-10-CM | POA: Insufficient documentation

## 2019-12-14 LAB — COMPREHENSIVE METABOLIC PANEL
ALT: 15 U/L (ref 0–44)
AST: 14 U/L — ABNORMAL LOW (ref 15–41)
Albumin: 3.6 g/dL (ref 3.5–5.0)
Alkaline Phosphatase: 107 U/L (ref 38–126)
Anion gap: 10 (ref 5–15)
BUN: 7 mg/dL (ref 6–20)
CO2: 28 mmol/L (ref 22–32)
Calcium: 9.3 mg/dL (ref 8.9–10.3)
Chloride: 97 mmol/L — ABNORMAL LOW (ref 98–111)
Creatinine, Ser: 0.73 mg/dL (ref 0.44–1.00)
GFR calc Af Amer: >60 mL/min (ref >60)
GFR calc non Af Amer: >60 mL/min (ref >60)
Glucose, Bld: 404 mg/dL — ABNORMAL HIGH (ref 70–99)
Potassium: 4.5 mmol/L (ref 3.5–5.1)
Sodium: 135 mmol/L (ref 135–145)
Total Bilirubin: 1.1 mg/dL (ref 0.3–1.2)
Total Protein: 6.4 g/dL — ABNORMAL LOW (ref 6.5–8.1)

## 2019-12-14 LAB — BRAIN NATRIURETIC PEPTIDE: B Natriuretic Peptide: 357.1 pg/mL — ABNORMAL HIGH (ref 0.0–100.0)

## 2019-12-14 MED ORDER — ENTRESTO 24-26 MG PO TABS: 1.0000 | ORAL_TABLET | Freq: Two times a day (BID) | ORAL | 11 refills | Status: DC

## 2019-12-14 MED ORDER — EMPAGLIFLOZIN 10 MG PO TABS
10.0000 mg | ORAL_TABLET | Freq: Every day | ORAL | 11 refills | Status: DC
Start: 2019-12-14 — End: 2020-01-12

## 2019-12-14 MED FILL — POTASSIUM CHLORIDE CRYS ER: 20 | 34 days supply | Qty: 34 | Fill #1

## 2019-12-14 MED FILL — CARVEDILOL 3.125 MG TABLET: 3.125 | 34 days supply | Qty: 68 | Fill #1

## 2019-12-14 MED FILL — FUROSEMIDE 20 MG TABS: 20 | 34 days supply | Qty: 34 | Fill #1

## 2019-12-14 MED FILL — SPIRONOLACTONE 25 MG TABS: 25 | 34 days supply | Qty: 17 | Fill #1

## 2019-12-14 MED FILL — DIGOXIN 0.125 MG TABLET: 125 | 34 days supply | Qty: 34 | Fill #1

## 2019-12-14 MED FILL — ATORVASTATIN 80 MG TABLET: 80 | 34 days supply | Qty: 34 | Fill #1

## 2019-12-14 NOTE — Patient Instructions (Addendum)
It was a pleasure seeing you today!  MEDICATIONS: -We are changing your medications today -Start Jardiance (empagliflozin) 10 mg (1 tablet) daily -Call if you have questions about your medications.  LABS: -We will call you if your labs need attention.  NEXT APPOINTMENT: Return to clinic in 5 weeks with Pharmacy Clinic.  In general, to take care of your heart failure: -Limit your fluid intake to 2 Liters (half-gallon) per day.   -Limit your salt intake to ideally 2-3 grams (2000-3000 mg) per day. -Weigh yourself daily and record, and bring that "weight diary" to your next appointment.  (Weight gain of 2-3 pounds in 1 day typically means fluid weight.) -The medications for your heart are to help your heart and help you live longer.   -Please contact us before stopping any of your heart medications.  Call the clinic at [phone] with questions or to reschedule future appointments.

## 2019-12-14 NOTE — Telephone Encounter (Signed)
Advanced Heart Failure Patient Advocate Encounter  Prior Authorization for Shelly [name] has been approved.    PA# [id number] Effective dates: 12/14/19 through 12/13/20  Patients co-pay is $75  [facility] provided patient Shelly [name] co-pay card. Called and provided Jardiance co-pay card billing information to the patient's pharmacy.   Archer Asa, CPhT

## 2019-12-14 NOTE — Telephone Encounter (Signed)
Patient Advocate Encounter   Received notification from [employer] that prior authorization for Shelly [name] is required.   PA submitted on CoverMyMeds Key [id number] Status is pending   Will continue to follow.  Ran a test claim for Jardiance, no PA required at this time. 30 day supply, $34.99. 90 day supply, $44.99. Was able to obtain a $10 Jardiance co-pay card for the patient.  Pharmacy Billing Information  BIN: [id number]  PCN: [id number]  Group: [id number]  ID: [id number]

## 2019-12-30 ENCOUNTER — Other Ambulatory Visit (HOSPITAL_COMMUNITY): Payer: Self-pay | Admitting: Cardiology

## 2019-12-30 DIAGNOSIS — N390 Urinary tract infection, site not specified: Secondary | ICD-10-CM

## 2019-12-30 MED ORDER — CEPHALEXIN 500 MG PO CAPS: 500.0000 mg | ORAL_CAPSULE | Freq: Three times a day (TID) | ORAL | 0 refills | Status: AC

## 2019-12-30 NOTE — Progress Notes (Incomplete)
***In Progress*** PCP: [facility] PCP-Cardiologist: Dr. Eldridge Dace Dr. Gala Romney   HPI:  Shelly [name] is a 49 year old with history of recently diagnosed diabetes and tobacco abuse, admitted to [facility] 5/21 with progressive SOB, LEE and intermittent CP and found to be in acute CHF with marked volume overload. BNP 1913, HS Trop 14>13, negative COVID, HIV NR.  Started on IV furosemide  for diuresis. ECG with possible small anterolateral Qs waves. Echo showed EF 20% with severe RV dysfunction.R/LHC showed diffuse non-obstructive CAD with chronic dissection/recannalization of mid LAD. Suspect she had an out of hospital MI at some point.  There were no interventional targets. EF out of proportion to cath findings. GDMT was started. It was advised that she stay for cMRI to understand degree of infarct/vaiability, however she refused to stay for additional inpatient workup. Outpatient cMRI recommended. This has not been done yet due to lack of insurance. She is working on Museum/gallery conservator. New coverage will take effect July 1.   She recently presented to HF Clinic for follow up with Robbie Lis, PA-C on 11/14/19. Came with her husband. Was feeling much improved. Euvolemic on exam. No CP. Denied resting dyspnea. She had mild dyspnea w/ moderate activities. No orthopnea/PND. BP was stable. Reported tolerating medication ok without side effects.  She was still smoking but had cut down from 1 ppd>> 1/2 ppd. She reported being under a lot of stress. Very stressful job and family issues. Had family court hearing the following week. She was requesting PRN meds for anxiety.   Today he returns to HF clinic for pharmacist medication titration. At recent visits to clinic,  losartan was discontinued and Entresto 24/26 mg BID was initiated. Additionally, empagliflozin 10 mg daily was initiated. Unfortunately, she had to stop empagliflozin after developing a UTI 2 weeks later.    HF Medications:  Carvedilol 3.125 mg BID Entresto 24/26 mg BID Spironolactone 12.5 mg daily Digoxin 0.125 mg daily Furosemide 20 mg daily Potassium 20 mEq daily  Has the patient been experiencing any side effects to the medications prescribed?  no  Does the patient have any problems obtaining medications due to transportation or finances?   No - now has [employer]. Was previously approved for [employer] patient assistance for [employer] before she obtained [employer]. Will work on completing prior authorization for [employer] for her new insurance. I gave her Sherryll Burger and Jardiance copay cards today.   Understanding of regimen: good Understanding of indications: good Potential of compliance: good Patient understands to avoid NSAIDs. Patient understands to avoid decongestants.    Pertinent Lab Values: . Serum creatinine 0.73, BUN 7, Potassium 4.5, Sodium 135, BNP 357.1 pg/mL  Vital Signs: . Weight: 209.6 lbs (last clinic weight: 209.2 lbs) . Blood pressure: 104/38  . Heart rate: 80   Assessment: 1. Chronic Systolic Heart Failure: - new diagnosis. Echo 10/2019 w/ EF 20% with severe RV dysfunction.R/LHC showed diffuse non-obstructive CAD with chronic dissection/recannalization of mid LAD. Suspect she had an out of hospital MI at some point.  There were no interventional targets. EF out of proportion to cath findings.  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  - NYHA class II, euvolemic on exam - Continue furosemide 20 mg daily and KCl 20 meq daily.  - Continue carvedilol 3.125 mg BID.  - Continue Entresto 24-26 mg BID.   - Continue spironolactone 12.5 mg daily - Off empagliflozin (Jardiance) due to UTI. -  Continue digoxin 0.125 mg daily. Last digoxin level WNL at 0.4 ng/mL on 11/14/19.    - Continue GDMT, with gradual titration q3-4 weeks. Will need repeat Echo in 3 months + EP referral for potential ICD if EF remains <  35%.    2. CAD: - LHC 5/21 showed diffuse non-obstructive CAD with chronic dissection/recannalization of mid LAD. Suspect she had an out of hospital MI at some point.  There were no interventional targets. - Stable without CP - Continue medical therapy with ASA/statin and ? blocker - LDL goal <70 (98 on recent FLP).  - Continue atorvastatin 80 qhs.  - Complete smoking cessation strongly advised.   3. Type 2 DM: management per PCP - continue Metformin - Start empagliflozin as above  4. Tobacco Abuse: still smoking but has cut back from 1 ppd >> 1/2 ppd - Ddiscussed importance of complete cessation - We discussed cessation aids, but she would like to continue to quit on her own.     Plan: 1) Medication changes: Based on clinical presentation, vital signs and recent labs will *** 2) Labs: *** 3) Follow-up: ***   Karle Plumber, PharmD, BCPS, BCCP, CPP Heart Failure Clinic Pharmacist [phone]

## 2020-01-04 ENCOUNTER — Telehealth (HOSPITAL_COMMUNITY): Payer: Self-pay | Admitting: *Deleted

## 2020-01-12 ENCOUNTER — Other Ambulatory Visit: Payer: Self-pay

## 2020-01-12 ENCOUNTER — Ambulatory Visit (HOSPITAL_COMMUNITY)
Admission: RE | Admit: 2020-01-12 | Discharge: 2020-01-12 | Disposition: A | Discharge status: Home / Self Care | Payer: 59 | Source: Ambulatory Visit | Attending: Cardiology | Admitting: Cardiology

## 2020-01-12 ENCOUNTER — Encounter (HOSPITAL_COMMUNITY): Payer: Self-pay

## 2020-01-12 ENCOUNTER — Telehealth (HOSPITAL_COMMUNITY): Payer: Self-pay | Admitting: Pharmacist

## 2020-01-12 VITALS — BP 102/60 | HR 83 | Wt 201.6 lb

## 2020-01-12 DIAGNOSIS — E119 Type 2 diabetes mellitus without complications: Secondary | ICD-10-CM | POA: Diagnosis not present

## 2020-01-12 DIAGNOSIS — F17210 Nicotine dependence, cigarettes, uncomplicated: Secondary | ICD-10-CM | POA: Diagnosis not present

## 2020-01-12 DIAGNOSIS — I5022 Chronic systolic (congestive) heart failure: Secondary | ICD-10-CM | POA: Diagnosis present

## 2020-01-12 DIAGNOSIS — Z7984 Long term (current) use of oral hypoglycemic drugs: Secondary | ICD-10-CM | POA: Diagnosis not present

## 2020-01-12 DIAGNOSIS — I2510 Atherosclerotic heart disease of native coronary artery without angina pectoris: Secondary | ICD-10-CM | POA: Diagnosis not present

## 2020-01-12 DIAGNOSIS — F419 Anxiety disorder, unspecified: Secondary | ICD-10-CM | POA: Insufficient documentation

## 2020-01-12 DIAGNOSIS — Z87440 Personal history of urinary (tract) infections: Secondary | ICD-10-CM | POA: Diagnosis not present

## 2020-01-12 DIAGNOSIS — Z7982 Long term (current) use of aspirin: Secondary | ICD-10-CM | POA: Diagnosis not present

## 2020-01-12 DIAGNOSIS — I110 Hypertensive heart disease with heart failure: Secondary | ICD-10-CM | POA: Insufficient documentation

## 2020-01-12 DIAGNOSIS — Z79899 Other long term (current) drug therapy: Secondary | ICD-10-CM | POA: Insufficient documentation

## 2020-01-12 MED ORDER — CARVEDILOL 3.125 MG PO TABS: 3.1250 mg | ORAL_TABLET | Freq: Two times a day (BID) | ORAL | 11 refills | Status: DC

## 2020-01-12 MED ORDER — FUROSEMIDE 20 MG PO TABS: 20.0000 mg | ORAL_TABLET | ORAL | 11 refills | Status: DC

## 2020-01-12 MED ORDER — EMPAGLIFLOZIN 25 MG PO TABS
25.0000 mg | ORAL_TABLET | Freq: Every day | ORAL | 11 refills | Status: DC
Start: 2020-01-12 — End: 2020-03-15

## 2020-01-12 MED ORDER — ATORVASTATIN CALCIUM 80 MG PO TABS: 80.0000 mg | ORAL_TABLET | Freq: Every day | ORAL | 11 refills | Status: DC

## 2020-01-12 MED ORDER — SPIRONOLACTONE 25 MG PO TABS: 25.0000 mg | ORAL_TABLET | Freq: Every day | ORAL | 3 refills | Status: DC

## 2020-01-12 MED ORDER — DIGOXIN 125 MCG PO TABS: 0.1250 mg | ORAL_TABLET | Freq: Every day | ORAL | 11 refills | Status: DC

## 2020-01-12 NOTE — Progress Notes (Signed)
PCP: [facility] PCP-Cardiologist: Dr. Eldridge Dace Dr. Gala Romney   HPI:  Shelly [name] is a 49 year old with history of recently diagnosed diabetes and tobacco abuse, admitted to [facility] 5/21 with progressive SOB, LEE and intermittent CP and found to be in acute CHF with marked volume overload. BNP 1913, HS Trop 14>13, negative COVID, HIV NR.  Started on IV furosemide  for diuresis. ECG with possible small anterolateral Qs waves. Echo showed EF 20% with severe RV dysfunction.R/LHC showed diffuse non-obstructive CAD with chronic dissection/recannalization of mid LAD. Suspect she had an out of hospital MI at some point.  There were no interventional targets. EF out of proportion to cath findings. GDMT was started. It was advised that she stay for cMRI to understand degree of infarct/vaiability, however she refused to stay for additional inpatient workup. Outpatient cMRI recommended. This has not been done yet due to lack of insurance. She is working on Museum/gallery conservator. New coverage will take effect July 1.   She recently presented to HF Clinic for follow up with Robbie Lis, PA-C on 11/14/19. Came with her husband. Was feeling much improved. Euvolemic on exam. No CP. Denied resting dyspnea. She had mild dyspnea with moderate activities. No orthopnea/PND. BP was stable. Reported tolerating medication ok without side effects.  She was still smoking but had cut down from 1 ppd>> 1/2 ppd. She reported being under a lot of stress. Very stressful job and family issues. Had family court hearing the following week. She was requesting PRN meds for anxiety.  Today she returns to HF clinic for pharmacist medication titration. At recent visits to clinic, losartan was discontinued and Entresto 24/26 mg BID was initiated (11/14/2019). Additionally, empagliflozin 10 mg daily was started. She did develop a UTI with empagliflozin but this has resolved. Overall she is feeling well today. She endorses  lightheadedness when bending over or standing up that subsides after ~30 seconds. Also reports fatigue, "staying tired," throughout the day but PCP recommended B12/D3 supplementation due to lab results and has been taking for a few days now. Denies CP and palpitations. No SOB or DOE. Measures weight twice daily and reports stable weights (200-201 lbs) in the setting of furosemide 20 mg daily. Patient endorses "minimal"/trace LEE swelling which was confirmed on exam. No PND or orthopnea. No appetite changes, endorses starting a low-carb diet for diabetes and has continued her low-salt diet. Takes all medications as prescribed and is tolerating medications well.  HF Medications: Carvedilol 3.125 mg BID (started 11/09/19) Entresto 24/26 mg BID (started 12/14/19) Spironolactone 12.5 mg daily (started 11/09/19) Empagliflozin 10 mg daily (started 12/14/19) Digoxin 0.125 mg daily Furosemide 20 mg daily Potassium 20 mEq daily  Has the patient been experiencing any side effects to the medications prescribed?  Yes - had a UTI with empagliflozin but this has resolved.  Does the patient have any problems obtaining medications due to transportation or finances?   No - now has [employer]. Has copay cards for Entresto and empagliflozin [place] [employer]) Was previously approved for [employer] patient assistance for [employer] before she obtained [employer].   Understanding of regimen: good Understanding of indications: good Potential of compliance: good Patient understands to avoid NSAIDs. Patient understands to avoid decongestants.   Pertinent Lab Values: 12/14/2019 . Serum creatinine 0.73, BUN 7, Potassium 4.5, Sodium 135, BNP 357.1 pg/mL  Vital Signs: . Weight: 201.6 lbs (last clinic weight: 209.2 lbs) . Blood pressure: 102/60  . Heart rate: 83 bpm  Assessment:  1. Chronic Systolic Heart Failure: - New diagnosis. Echo 10/2019 w/ EF 20% with severe RV dysfunction.R/LHC showed diffuse  non-obstructive CAD with chronic dissection/recannalization of mid LAD. Suspect she had an out of hospital MI at some point.  There were no interventional targets. EF out of proportion to cath findings.  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  - NYHA class II, euvolemic-dry on exam - Decrease furosemide to 20 mg MWF and discontinue potassium chloride supplementation.  - Continue carvedilol 3.125 mg BID.  - Continue Entresto 24-26 mg BID.   - Increase spironolactone to 25 mg daily. Repeat BMET in 1 week.  - Continue digoxin 0.125 mg daily. Last digoxin level WNL at 0.4 ng/mL (11/14/19) - Increase empagliflozin 25 mg daily (increased by PCP, but had not made change yet) - Will need repeat Echo in 3 months + EP referral for potential ICD if EF remains < 35%.   2. CAD: - LHC 5/21 showed diffuse non-obstructive CAD with chronic dissection/recannalization of mid LAD. Suspect she had an out of hospital MI at some point.  There were no interventional targets. - Stable without CP - Continue medical therapy with ASA/statin and ? blocker - LDL goal <70 (98 on recent FLP).  - Continue atorvastatin 80 qhs.  - Smoking cessation strongly advised.   3. Type 2 DM: management per PCP - continue Metformin - Increase empagliflozin to 25 mg daily as above  4. Tobacco Abuse: still smoking but has cut back from 1 ppd >> 1/2 ppd - Cessation aids previously discussed, but she would like to continue to quit on her own.    Plan: 1) Medication changes: Based on clinical presentation, vital signs and recent labs will: - Increase spironolactone to 25 mg daily - Increase empagliflozin to 25 mg daily  - Discontinue potassium supplements - Reduce furosemide to 20 mg MWF.  2) Follow-up: 1 week for BMET (01/19/20). Appointment with Bensimhon 03/15/20.   Karle Plumber, PharmD, BCPS, BCCP, CPP Heart Failure Clinic Pharmacist [phone]

## 2020-01-12 NOTE — Telephone Encounter (Signed)
Refills for carvedilol, digoxin and atorvastatin sent to [facility].

## 2020-01-12 NOTE — Patient Instructions (Addendum)
It was a pleasure seeing you today!  MEDICATIONS: -We are changing your medications today -Stop potassium chloride supplements - Increase spironolactone to 25 mg (1 tablet) daily - Decrease furosemide to 20 mg Monday/Wednesday/Friday -Call if you have questions about your medications.   NEXT APPOINTMENT: Return to clinic in 1 week for labs.   Return to clinic in 2 months with Dr. Gala Romney. Please call the clinic at [phone] to schedule your appointment.   In general, to take care of your heart failure: -Limit your fluid intake to 2 Liters (half-gallon) per day.   -Limit your salt intake to ideally 2-3 grams (2000-3000 mg) per day. -Weigh yourself daily and record, and bring that "weight diary" to your next appointment.  (Weight gain of 2-3 pounds in 1 day typically means fluid weight.) -The medications for your heart are to help your heart and help you live longer.   -Please contact us before stopping any of your heart medications.  Call the clinic at [phone] with questions or to reschedule future appointments.

## 2020-01-17 NOTE — Telephone Encounter (Signed)
Refill encounter ?

## 2020-01-19 ENCOUNTER — Other Ambulatory Visit (HOSPITAL_COMMUNITY): Payer: PRIVATE HEALTH INSURANCE

## 2020-02-03 ENCOUNTER — Telehealth (HOSPITAL_COMMUNITY): Payer: Self-pay | Admitting: Emergency Medicine

## 2020-02-03 NOTE — Telephone Encounter (Signed)
Reaching out to patient to offer assistance regarding upcoming cardiac imaging study; pt verbalizes understanding of appt date/time, parking situation and where to check in, and verified current allergies; name and call back number provided for further questions should they arise Rockwell [place] RN Navigator Cardiac Imaging [facility] [phone] office [phone] cell   Some claustro but tolerated in the past; denies implants

## 2020-02-06 ENCOUNTER — Other Ambulatory Visit: Payer: Self-pay

## 2020-02-06 ENCOUNTER — Ambulatory Visit (HOSPITAL_COMMUNITY)
Admission: RE | Admit: 2020-02-06 | Discharge: 2020-02-06 | Disposition: A | Discharge status: Home / Self Care | Payer: 59 | Source: Ambulatory Visit | Attending: Cardiology | Admitting: Cardiology

## 2020-02-06 DIAGNOSIS — I5022 Chronic systolic (congestive) heart failure: Secondary | ICD-10-CM | POA: Diagnosis present

## 2020-02-06 MED ORDER — GADOBUTROL 1 MMOL/ML IV SOLN
10.0000 mL | Freq: Once | INTRAVENOUS | Status: AC | PRN
  Administered 2020-02-06: 10 mL via INTRAVENOUS

## 2020-03-14 ENCOUNTER — Other Ambulatory Visit (HOSPITAL_COMMUNITY): Payer: Self-pay | Admitting: Internal Medicine

## 2020-03-14 NOTE — Progress Notes (Signed)
Advanced Heart Failure Clinic Note   Referring Physician: PCP: Dema Severin, NP  Cardiologist: Dr. Eldridge Dace HF: Dr. Gala Romney   HPI:  Shelly [name] is a 49 year old woman with DM2, tobacco abuse,CAD and systolic HF diagnosed in 5/21 with EF 20%  Admitted 5/21 with acute HF. Echo EF 20% with severe RV dysfunction. R/LHC showed diffuse non-obstructive CAD with chronic dissection/recannalization of mid LAD. Suspect she had an out of hospital MI at some point.  There were no interventional targets.  Outpatient cMRI  8/21 1.  Mild LV dilatation with severe systolic dysfunction (LVEF 30%) 2. Subendocardial LGE c/w prior LAD and RCA infarcts. >50% transmurality of LGE suggesting nonviability in basal to mid inferior walls, apical anterior/septal walls, and apex. <50% transmurality of LGE suggesting viability in mid anterior wall. 3.  Normal RV size and systolic function (EF 66%)  Here with her husband for f/u. She is still smoking about 1ppd depending on stress level. She reports being under a lot of stress. Very stressful job and family issues. Denies CP. Can feel her heartbeat at night. Mild DOE. Mild edema. Lasix cut back to every other day but she increased to daily due to swelling. Orthostasis improved  Echo 5/21: EF 20% with severe RV dysfunction.  RHC 5/21 Ao = 106/75 (87) LV = 107/27 RA = 15 RV = 54/16 PA = 57/25 (40) PCW = 27 (v = 37) Fick cardiac output/index = 4.4/2.0 PVR = 1.4 WU SVR = 1297 Ao sat = 99% PA sat = 62%, 63%   Past Medical History:  Diagnosis Date  . Diabetes mellitus without complication (HCC)   . Tobacco abuse     Current Outpatient Medications  Medication Sig Dispense Refill  . aspirin EC 81 MG tablet Take 1 tablet (81 mg total) by mouth daily. 150 tablet 2  . atorvastatin (LIPITOR) 80 MG tablet Take 1 tablet (80 mg total) by mouth daily. 30 tablet 11  . carvedilol (COREG) 3.125 MG tablet Take 1 tablet (3.125 mg total) by mouth 2 (two) times  daily. 60 tablet 11  . D3-50 1.25 MG (50000 UT) capsule Take by mouth.    . digoxin (LANOXIN) 0.125 MG tablet TAKE 1 TABLET BY MOUTH DAILY 90 tablet 3  . empagliflozin (JARDIANCE) 25 MG TABS tablet Take 1 tablet (25 mg total) by mouth daily. 30 tablet 11  . furosemide (LASIX) 20 MG tablet Take 1 tablet (20 mg total) by mouth every Monday, Wednesday, and Friday. Can take EXTRA 20mg  daily additional for increased fluid/weight gain 30 tablet 11  . metFORMIN (GLUCOPHAGE) 1000 MG tablet Take 1 tablet by mouth in the morning and at bedtime.    . sacubitril-valsartan (ENTRESTO) 24-26 MG Take 1 tablet by mouth 2 (two) times daily. 60 tablet 11  . spironolactone (ALDACTONE) 25 MG tablet Take 1 tablet (25 mg total) by mouth daily. 90 tablet 3  . TRULICITY 0.75 MG/0.5ML SOPN Inject 0.75 mg into the skin once a week.    . vitamin B-12 (CYANOCOBALAMIN) 1000 MCG tablet Take 1,000 mcg by mouth daily. Taking once a day     No current facility-administered medications for this encounter.    No Known Allergies    Social History   Socioeconomic History  . Marital status: Married    Spouse name: Not on file  . Number of children: Not on file  . Years of education: Not on file  . Highest education level: Not on file  Occupational History  .  Not on file  Tobacco Use  . Smoking status: Current Every Day Smoker  . Smokeless tobacco: Never Used  Vaping Use  . Vaping Use: Never used  Substance and Sexual Activity  . Alcohol use: Not Currently  . Drug use: Never  . Sexual activity: Not on file  Other Topics Concern  . Not on file  Social History Narrative  . Not on file   Social Determinants of Health   Financial Resource Strain:   . Difficulty of Paying Living Expenses: Not on file  Food Insecurity:   . Worried About Programme researcher, broadcasting/film/video in the Last Year: Not on file  . Ran Out of Food in the Last Year: Not on file  Transportation Needs:   . Lack of Transportation (Medical): Not on file  .  Lack of Transportation (Non-Medical): Not on file  Physical Activity:   . Days of Exercise per Week: Not on file  . Minutes of Exercise per Session: Not on file  Stress:   . Feeling of Stress : Not on file  Social Connections:   . Frequency of Communication with Friends and Family: Not on file  . Frequency of Social Gatherings with Friends and Family: Not on file  . Attends Religious Services: Not on file  . Active Member of Clubs or Organizations: Not on file  . Attends Banker Meetings: Not on file  . Marital Status: Not on file  Intimate Partner Violence:   . Fear of Current or Ex-Partner: Not on file  . Emotionally Abused: Not on file  . Physically Abused: Not on file  . Sexually Abused: Not on file      Family History  Problem Relation Age of Onset  . Sudden Cardiac Death Mother   . CAD Father   . CAD Paternal Grandmother   . CAD Paternal Grandfather     Vitals:   03/15/20 1043  BP: 104/70  Pulse: 83  SpO2: 98%  Weight: 91.5 kg (201 lb 12.8 oz)  Height: 5\' 8"  (1.727 m)     PHYSICAL EXAM: General:  Well appearing. No respiratory difficulty HEENT: normal Neck: supple. no JVD. Carotids 2+ bilat; no bruits. No lymphadenopathy or thyromegaly appreciated. Cor: PMI nondisplaced. Mildly irregular rate & rhythm. No rubs, gallops or murmurs. Lungs: decreased throughout  Abdomen: soft, nontender, nondistended. No hepatosplenomegaly. No bruits or masses. Good bowel sounds. Extremities: no cyanosis, clubbing, rash, tr edema Neuro: alert & oriented x 3, cranial nerves grossly intact. moves all 4 extremities w/o difficulty. Affect pleasant.  ECG:    ASSESSMENT & PLAN:  1. 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 R/LHC showed diffuse non-obstructive CAD with chronic dissection/recannalization of mid LAD. Suspect she had an out of hospital MI at some point.  There were no interventional targets. - cMRI 8/21:  LVEF 30% priro LAD & RCA infarcts. RV ok  - NYHA II. Volume status ok. BP low will not titrate today  - continue Entresto 24-26 bid.  - continue Spiro 25 mg daily - Continue Coreg 3.125 mg bid - Continue digoxin 0.125 mg daily.  - Continue Jardiance 25mg  daily.  - Continue lasix 20mg  day   2. CAD: - LHC 5/21 showed diffuse non-obstructive CAD with chronic dissection/recannalization of mid LAD. Suspect she had an out of hospital MI at some point.  There were no interventional targets. - cMRI as above c/w prior infarcts in LAD/RCA territories - no  s/s ischemia - continue ASA/statin and  blocker - LDL goal <70 (109 on recent FLP). Continue atorvastatin 80 qhs. - complete smoking cessation strongly advised.   3. Type 2 DM: management per PCP - continue Metformin - continue Jardiance  4. Tobacco Abuse: still smoking but has cut back from 1 ppd>>1/2 ppd - discussed importance of smoking cessation  Arvilla Meres, MD 03/15/20

## 2020-03-15 ENCOUNTER — Ambulatory Visit (HOSPITAL_COMMUNITY)
Admission: RE | Admit: 2020-03-15 | Discharge: 2020-03-15 | Disposition: A | Discharge status: Home / Self Care | Payer: 59 | Source: Ambulatory Visit | Attending: Internal Medicine | Admitting: Internal Medicine

## 2020-03-15 ENCOUNTER — Encounter (HOSPITAL_COMMUNITY): Payer: Self-pay | Admitting: Internal Medicine

## 2020-03-15 ENCOUNTER — Other Ambulatory Visit: Payer: Self-pay

## 2020-03-15 ENCOUNTER — Other Ambulatory Visit (HOSPITAL_COMMUNITY): Payer: Self-pay | Admitting: Internal Medicine

## 2020-03-15 VITALS — BP 104/70 | HR 83 | Ht 68.0 in | Wt 201.8 lb

## 2020-03-15 DIAGNOSIS — F17210 Nicotine dependence, cigarettes, uncomplicated: Secondary | ICD-10-CM | POA: Diagnosis not present

## 2020-03-15 DIAGNOSIS — Z720 Tobacco use: Secondary | ICD-10-CM | POA: Diagnosis not present

## 2020-03-15 DIAGNOSIS — I5022 Chronic systolic (congestive) heart failure: Secondary | ICD-10-CM | POA: Insufficient documentation

## 2020-03-15 DIAGNOSIS — Z7984 Long term (current) use of oral hypoglycemic drugs: Secondary | ICD-10-CM | POA: Diagnosis not present

## 2020-03-15 DIAGNOSIS — Z79899 Other long term (current) drug therapy: Secondary | ICD-10-CM | POA: Insufficient documentation

## 2020-03-15 DIAGNOSIS — Z7982 Long term (current) use of aspirin: Secondary | ICD-10-CM | POA: Insufficient documentation

## 2020-03-15 DIAGNOSIS — Z639 Problem related to primary support group, unspecified: Secondary | ICD-10-CM | POA: Diagnosis not present

## 2020-03-15 DIAGNOSIS — Z563 Stressful work schedule: Secondary | ICD-10-CM | POA: Diagnosis not present

## 2020-03-15 DIAGNOSIS — E119 Type 2 diabetes mellitus without complications: Secondary | ICD-10-CM | POA: Insufficient documentation

## 2020-03-15 DIAGNOSIS — I252 Old myocardial infarction: Secondary | ICD-10-CM | POA: Diagnosis not present

## 2020-03-15 DIAGNOSIS — Z8249 Family history of ischemic heart disease and other diseases of the circulatory system: Secondary | ICD-10-CM | POA: Insufficient documentation

## 2020-03-15 DIAGNOSIS — I2510 Atherosclerotic heart disease of native coronary artery without angina pectoris: Secondary | ICD-10-CM | POA: Insufficient documentation

## 2020-03-15 LAB — COMPREHENSIVE METABOLIC PANEL
ALT: 12 U/L (ref 0–44)
AST: 13 U/L — ABNORMAL LOW (ref 15–41)
Albumin: 3.8 g/dL (ref 3.5–5.0)
Alkaline Phosphatase: 82 U/L (ref 38–126)
Anion gap: 12 (ref 5–15)
BUN: 8 mg/dL (ref 6–20)
CO2: 26 mmol/L (ref 22–32)
Calcium: 8.9 mg/dL (ref 8.9–10.3)
Chloride: 98 mmol/L (ref 98–111)
Creatinine, Ser: 0.76 mg/dL (ref 0.44–1.00)
GFR calc non Af Amer: >60 mL/min (ref >60)
Glucose, Bld: 223 mg/dL — ABNORMAL HIGH (ref 70–99)
Potassium: 3 mmol/L — ABNORMAL LOW (ref 3.5–5.1)
Sodium: 136 mmol/L (ref 135–145)
Total Bilirubin: 1 mg/dL (ref 0.3–1.2)
Total Protein: 6.4 g/dL — ABNORMAL LOW (ref 6.5–8.1)

## 2020-03-15 LAB — DIGOXIN LEVEL: Digoxin Level: 0.5 ng/mL — ABNORMAL LOW (ref 1.0–2.0)

## 2020-03-15 LAB — BRAIN NATRIURETIC PEPTIDE: B Natriuretic Peptide: 314 pg/mL — ABNORMAL HIGH (ref 0.0–100.0)

## 2020-03-15 MED ORDER — ENTRESTO 24-26 MG PO TABS: 1.0000 | ORAL_TABLET | Freq: Two times a day (BID) | ORAL | 11 refills | Status: DC

## 2020-03-15 MED ORDER — SPIRONOLACTONE 25 MG PO TABS
25.0000 mg | ORAL_TABLET | Freq: Every day | ORAL | 3 refills | Status: DC
Start: 2020-03-15 — End: 2021-01-10

## 2020-03-15 MED ORDER — ATORVASTATIN CALCIUM 80 MG PO TABS
80.0000 mg | ORAL_TABLET | Freq: Every day | ORAL | 11 refills | Status: DC
Start: 2020-03-15 — End: 2021-01-10

## 2020-03-15 MED ORDER — EMPAGLIFLOZIN 25 MG PO TABS
25.0000 mg | ORAL_TABLET | Freq: Every day | ORAL | 11 refills | Status: DC
Start: 2020-03-15 — End: 2020-08-27

## 2020-03-15 MED ORDER — FUROSEMIDE 20 MG PO TABS
20.0000 mg | ORAL_TABLET | ORAL | 11 refills | Status: DC
Start: 2020-03-16 — End: 2020-05-01

## 2020-03-15 MED ORDER — DIGOXIN 125 MCG PO TABS
125.0000 ug | ORAL_TABLET | Freq: Every day | ORAL | 3 refills | Status: DC
Start: 2020-03-15 — End: 2021-01-15

## 2020-03-15 MED ORDER — CARVEDILOL 3.125 MG PO TABS: 3.1250 mg | ORAL_TABLET | Freq: Two times a day (BID) | ORAL | 11 refills | Status: DC

## 2020-03-15 MED FILL — ENTRESTO 24 MG-26 MG TABLET: 24-26 | 30 days supply | Qty: 60 | Fill #0

## 2020-03-15 NOTE — Patient Instructions (Signed)
Labs done today, your results will be available in MyChart, we will contact you for abnormal readings.  Your physician recommends that you return for a follow up appointment with our APP team in 1-2 weeks.  If you have any questions or concerns before your next appointment please send [place] a message through [place] or call our office at [phone].    TO LEAVE A MESSAGE FOR THE NURSE SELECT OPTION 2, PLEASE LEAVE A MESSAGE INCLUDING: . YOUR NAME . DATE OF BIRTH . CALL BACK NUMBER . REASON FOR CALL**this is important as we prioritize the call backs  YOU WILL RECEIVE A CALL BACK THE SAME DAY AS LONG AS YOU CALL BEFORE 4:00 PM

## 2020-03-23 ENCOUNTER — Telehealth (HOSPITAL_COMMUNITY): Payer: Self-pay | Admitting: Cardiology

## 2020-03-23 MED ORDER — POTASSIUM CHLORIDE CRYS ER 20 MEQ PO TBCR: EXTENDED_RELEASE_TABLET | ORAL | 3 refills | Status: DC

## 2020-03-23 NOTE — Telephone Encounter (Signed)
[phone] (M) Pt aware and voiced understanding

## 2020-03-23 NOTE — Telephone Encounter (Signed)
-----   Message from Dolores Patty, MD sent at 03/23/2020 10:35 AM EDT ----- Start kcl 20 daily.   Take 3 tabs the first day only. Repeat BMET 2 weeks

## 2020-04-16 ENCOUNTER — Encounter (HOSPITAL_COMMUNITY): Payer: 59

## 2020-05-01 ENCOUNTER — Encounter (HOSPITAL_COMMUNITY): Payer: Self-pay

## 2020-05-01 ENCOUNTER — Ambulatory Visit (HOSPITAL_COMMUNITY)
Admission: RE | Admit: 2020-05-01 | Discharge: 2020-05-01 | Disposition: A | Discharge status: Home / Self Care | Payer: 59 | Source: Ambulatory Visit | Attending: Cardiology | Admitting: Cardiology

## 2020-05-01 ENCOUNTER — Telehealth (HOSPITAL_COMMUNITY): Payer: Self-pay | Admitting: Cardiology

## 2020-05-01 ENCOUNTER — Other Ambulatory Visit: Payer: Self-pay

## 2020-05-01 VITALS — BP 118/70 | HR 80 | Wt 198.4 lb

## 2020-05-01 DIAGNOSIS — I5022 Chronic systolic (congestive) heart failure: Secondary | ICD-10-CM | POA: Insufficient documentation

## 2020-05-01 DIAGNOSIS — Z7984 Long term (current) use of oral hypoglycemic drugs: Secondary | ICD-10-CM | POA: Diagnosis not present

## 2020-05-01 DIAGNOSIS — E785 Hyperlipidemia, unspecified: Secondary | ICD-10-CM | POA: Insufficient documentation

## 2020-05-01 DIAGNOSIS — Z8249 Family history of ischemic heart disease and other diseases of the circulatory system: Secondary | ICD-10-CM | POA: Diagnosis not present

## 2020-05-01 DIAGNOSIS — E119 Type 2 diabetes mellitus without complications: Secondary | ICD-10-CM | POA: Diagnosis not present

## 2020-05-01 DIAGNOSIS — F17200 Nicotine dependence, unspecified, uncomplicated: Secondary | ICD-10-CM | POA: Insufficient documentation

## 2020-05-01 DIAGNOSIS — I2510 Atherosclerotic heart disease of native coronary artery without angina pectoris: Secondary | ICD-10-CM | POA: Insufficient documentation

## 2020-05-01 DIAGNOSIS — Z79899 Other long term (current) drug therapy: Secondary | ICD-10-CM | POA: Insufficient documentation

## 2020-05-01 DIAGNOSIS — Z7982 Long term (current) use of aspirin: Secondary | ICD-10-CM | POA: Insufficient documentation

## 2020-05-01 DIAGNOSIS — I509 Heart failure, unspecified: Secondary | ICD-10-CM

## 2020-05-01 HISTORY — DX: Heart failure, unspecified: I50.9

## 2020-05-01 LAB — COMPREHENSIVE METABOLIC PANEL
ALT: 12 U/L (ref 0–44)
AST: 13 U/L — ABNORMAL LOW (ref 15–41)
Albumin: 4.2 g/dL (ref 3.5–5.0)
Alkaline Phosphatase: 73 U/L (ref 38–126)
Anion gap: 10 (ref 5–15)
BUN: 7 mg/dL (ref 6–20)
CO2: 30 mmol/L (ref 22–32)
Calcium: 9.3 mg/dL (ref 8.9–10.3)
Chloride: 98 mmol/L (ref 98–111)
Creatinine, Ser: 0.85 mg/dL (ref 0.44–1.00)
GFR, Estimated: >60 mL/min (ref >60)
Glucose, Bld: 195 mg/dL — ABNORMAL HIGH (ref 70–99)
Potassium: 3.2 mmol/L — ABNORMAL LOW (ref 3.5–5.1)
Sodium: 138 mmol/L (ref 135–145)
Total Bilirubin: 1.5 mg/dL — ABNORMAL HIGH (ref 0.3–1.2)
Total Protein: 6.2 g/dL — ABNORMAL LOW (ref 6.5–8.1)

## 2020-05-01 LAB — DIGOXIN LEVEL: Digoxin Level: 0.7 ng/mL — ABNORMAL LOW (ref 1.0–2.0)

## 2020-05-01 LAB — LIPID PANEL
Cholesterol: 113 mg/dL (ref 0–200)
HDL: 32 mg/dL — ABNORMAL LOW (ref >40)
LDL Cholesterol: 44 mg/dL (ref 0–99)
Total CHOL/HDL Ratio: 3.5 RATIO
Triglycerides: 185 mg/dL — ABNORMAL HIGH (ref <150)
VLDL: 37 mg/dL (ref 0–40)

## 2020-05-01 MED ORDER — POTASSIUM CHLORIDE ER 10 MEQ PO TBCR: 10.0000 meq | EXTENDED_RELEASE_TABLET | Freq: Every day | ORAL | 3 refills | Status: DC

## 2020-05-01 MED ORDER — ENTRESTO 49-51 MG PO TABS: 1.0000 | ORAL_TABLET | Freq: Two times a day (BID) | ORAL | 6 refills | Status: DC

## 2020-05-01 NOTE — Progress Notes (Signed)
Advanced Heart Failure Clinic Note   Referring Physician: PCP: Dema Severin, NP  Cardiologist: Dr. Eldridge Dace HF: Dr. Gala Romney   HPI:  Shelly [name] is a 49 year old woman with DM2, tobacco abuse,CAD and systolic HF diagnosed in 5/21 with EF 20%  Admitted 5/21 with acute HF. Echo EF 20% with severe RV dysfunction. R/LHC showed diffuse non-obstructive CAD with chronic dissection/recannalization of mid LAD. Suspect she had an out of hospital MI at some point.  There were no interventional targets.  Outpatient cMRI  8/21 1.  Mild LV dilatation with severe systolic dysfunction (LVEF 30%) 2. Subendocardial LGE c/w prior LAD and RCA infarcts. >50% transmurality of LGE suggesting nonviability in basal to mid inferior walls, apical anterior/septal walls, and apex. <50% transmurality of LGE suggesting viability in mid anterior wall. 3.  Normal RV size and systolic function (EF 66%)  She presents to clinic today for f/u. Her last visit was 10/21 and we were unable to further titrate meds due to soft BP.    Here today w/ her husband. Denies dyspnea. No CP. No LEE, orthopnea or PND. Wt stable. BP 118/70 today but typically lower at home ~low 100s systolic, per pt report. She states she had a "stressful morning at work" and thinks BP is up because of that. She denies any orthostatic symptoms. She reports that she was previously only taking lasix 3 days a week but recently increased to daily after she developed LEE (now resolved).  She is still smoking 1/2- 1 pppd.      Echo 5/21: EF 20% with severe RV dysfunction.  RHC 5/21 Ao = 106/75 (87) LV = 107/27 RA = 15 RV = 54/16 PA = 57/25 (40) PCW = 27 (v = 37) Fick cardiac output/index = 4.4/2.0 PVR = 1.4 WU SVR = 1297 Ao sat = 99% PA sat = 62%, 63%   Past Medical History:  Diagnosis Date  . CHF (congestive heart failure) (HCC)   . Diabetes mellitus without complication (HCC)   . Tobacco abuse     Current Outpatient Medications   Medication Sig Dispense Refill  . aspirin EC 81 MG tablet Take 1 tablet (81 mg total) by mouth daily. 150 tablet 2  . atorvastatin (LIPITOR) 80 MG tablet Take 1 tablet (80 mg total) by mouth daily. 30 tablet 11  . carvedilol (COREG) 3.125 MG tablet Take 1 tablet (3.125 mg total) by mouth 2 (two) times daily. 60 tablet 11  . D3-50 1.25 MG (50000 UT) capsule Take by mouth.    . digoxin (LANOXIN) 0.125 MG tablet Take 1 tablet (125 mcg total) by mouth daily. 90 tablet 3  . Dulaglutide (TRULICITY) 1.5 MG/0.5ML SOPN Inject into the skin once a week.    . empagliflozin (JARDIANCE) 25 MG TABS tablet Take 1 tablet (25 mg total) by mouth daily. 30 tablet 11  . furosemide (LASIX) 20 MG tablet Take 20 mg by mouth daily.    . metFORMIN (GLUCOPHAGE) 1000 MG tablet Take 1 tablet by mouth in the morning and at bedtime.    . sacubitril-valsartan (ENTRESTO) 24-26 MG Take 1 tablet by mouth 2 (two) times daily. 60 tablet 11  . spironolactone (ALDACTONE) 25 MG tablet Take 1 tablet (25 mg total) by mouth daily. 90 tablet 3  . vitamin B-12 (CYANOCOBALAMIN) 1000 MCG tablet Take 1,000 mcg by mouth daily. Taking once a day     No current facility-administered medications for this encounter.    No Known Allergies  Social History   Socioeconomic History  . Marital status: Married    Spouse name: Not on file  . Number of children: Not on file  . Years of education: Not on file  . Highest education level: Not on file  Occupational History  . Not on file  Tobacco Use  . Smoking status: Current Every Day Smoker  . Smokeless tobacco: Never Used  Vaping Use  . Vaping Use: Never used  Substance and Sexual Activity  . Alcohol use: Not Currently  . Drug use: Never  . Sexual activity: Not on file  Other Topics Concern  . Not on file  Social History Narrative  . Not on file   Social Determinants of Health   Financial Resource Strain:   . Difficulty of Paying Living Expenses: Not on file  Food  Insecurity:   . Worried About Programme researcher, broadcasting/film/video in the Last Year: Not on file  . Ran Out of Food in the Last Year: Not on file  Transportation Needs:   . Lack of Transportation (Medical): Not on file  . Lack of Transportation (Non-Medical): Not on file  Physical Activity:   . Days of Exercise per Week: Not on file  . Minutes of Exercise per Session: Not on file  Stress:   . Feeling of Stress : Not on file  Social Connections:   . Frequency of Communication with Friends and Family: Not on file  . Frequency of Social Gatherings with Friends and Family: Not on file  . Attends Religious Services: Not on file  . Active Member of Clubs or Organizations: Not on file  . Attends Banker Meetings: Not on file  . Marital Status: Not on file  Intimate Partner Violence:   . Fear of Current or Ex-Partner: Not on file  . Emotionally Abused: Not on file  . Physically Abused: Not on file  . Sexually Abused: Not on file      Family History  Problem Relation Age of Onset  . Sudden Cardiac Death Mother   . CAD Father   . CAD Paternal Grandmother   . CAD Paternal Grandfather     Vitals:   05/01/20 1037  BP: 118/70  Pulse: 80  SpO2: 99%  Weight: 90 kg (198 lb 6.4 oz)     PHYSICAL EXAM: General:  Well appearing. No respiratory difficulty HEENT: normal Neck: supple. no JVD. Carotids 2+ bilat; no bruits. No lymphadenopathy or thyromegaly appreciated. Cor: PMI nondisplaced. Regular rate & rhythm. No rubs, gallops or murmurs. Lungs: clear Abdomen: soft, nontender, nondistended. No hepatosplenomegaly. No bruits or masses. Good bowel sounds. Extremities: no cyanosis, clubbing, rash, edema Neuro: alert & oriented x 3, cranial nerves grossly intact. moves all 4 extremities w/o difficulty. Affect pleasant.   ECG:  Not performed   ASSESSMENT & PLAN:  1. 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 R/LHC showed diffuse  non-obstructive CAD with chronic dissection/recannalization of mid LAD. Suspect she had an out of hospital MI at some point.  There were no interventional targets. - cMRI 8/21: LVEF 30% priro LAD & RCA infarcts. RV ok  - NYHA II. Volume status ok. BP soft, 118/70 today and denies orthostatic symptoms.  - we discussed attempt at med titration and she is willing to try - Will stop daily lasix and KCl - Increase Entresto to 49-51 mg bid (she will monitor BP closely at home and will notify us if  she develops any orthostatic symptoms)  - continue Spiro 25 mg daily - Continue Coreg 3.125 mg bid - Continue digoxin 0.125 mg daily. Check Dig level  - Continue Jardiance 25mg  daily.  - Check BMP today and again in 7 days  - continue gradual med titration until fully optimized (may be limited by BP). Will plan repeat 2D echo once on maximally tolerated meds. Refer to EP for ICD if EF remains < 35%.   2. CAD: - LHC 5/21 showed diffuse non-obstructive CAD with chronic dissection/recannalization of mid LAD. Suspect she had an out of hospital MI at some point.  There were no interventional targets. - cMRI as above c/w prior infarcts in LAD/RCA territories - she denies ischemic CP  - continue ASA/statin and  blocker - complete smoking cessation strongly advised.   3. Type 2 DM: management per PCP - continue Metformin - continue Jardiance  4. Tobacco Abuse: still smoking 1/2-2ppm - discussed importance of smoking cessation  5. HLD:  - LDL goal <70 (98 on recent FLP 6/21).  - Continue atorvastatin 80 qhs. - Repeat FLP today. If LDL still > 70, will add zetia  - encouraged low fat diet and increase in physical activity   F/u in 3-4 weeks for attempt at further med titration   7/21, PA-C 05/01/20

## 2020-05-01 NOTE — Patient Instructions (Addendum)
INCREASE Entresto to 49/51 mg, one tab twice a day STOP Lasix and Potassium  Labs today We will only contact you if something comes back abnormal or we need to make some changes. Otherwise no news is good news!  Your physician recommends that you schedule a follow-up appointment in: 3-4 weeks  If you have any questions or concerns before your next appointment please send [place] a message through [place] or call our office at [phone].    TO LEAVE A MESSAGE FOR THE NURSE SELECT OPTION 2, PLEASE LEAVE A MESSAGE INCLUDING: . YOUR NAME . DATE OF BIRTH . CALL BACK NUMBER . REASON FOR CALL**this is important as we prioritize the call backs  YOU WILL RECEIVE A CALL BACK THE SAME DAY AS LONG AS YOU CALL BEFORE 4:00 PM

## 2020-05-01 NOTE — Telephone Encounter (Signed)
[phone] (M)m pt aware,, script sent to pharm, repeat labs 12/1

## 2020-05-01 NOTE — Telephone Encounter (Signed)
-----   Message from Allayne Butcher, [place] sent at 05/01/2020  1:45 PM EST ----- K low. Lasix was stopped today and Entresto increased. Add KCl 10 mEq daily. Repeat BMP in 1 week.

## 2020-05-09 ENCOUNTER — Other Ambulatory Visit (HOSPITAL_COMMUNITY): Payer: 59

## 2020-05-14 ENCOUNTER — Other Ambulatory Visit (HOSPITAL_COMMUNITY): Payer: 59

## 2020-05-15 NOTE — Progress Notes (Incomplete)
***In Progress*** PCP: Dema Severin, NP  Cardiologist: Dr. Eldridge Dace HF: Dr. Gala Romney   HPI:  Mrs Ding is a 49 year old woman with DM2, tobacco abuse, CAD and systolic HF diagnosed in 5/21 with EF 20%.  Admitted 5/21 with acute HF. Echo EF 20% with severe RV dysfunction.R/LHC showed diffuse non-obstructive CAD with chronic dissection/recannalization of mid LAD. Suspect she had an out of hospital MI at some point.  There were no interventional targets.  Outpatient cMRI  8/21 1. Mild LV dilatation with severe systolic dysfunction (LVEF 30%) 2. Subendocardial LGE c/w prior LAD and RCA infarcts. >50% transmurality of LGE suggesting nonviability in basal to mid inferior walls, apical anterior/septal walls, and apex. <50% transmurality of LGE suggesting viability in mid anterior wall. 3. Normal RV size and systolic function (EF 66%)  She recently presented to HF Clinic for follow up on 05/01/20. Her previous visit was 03/15/20 and we were unable to further titrate medications due to soft BP. Came to clinic with her husband. Denied dyspnea. No CP. No LEE, orthopnea or PND. Weight was stable. BP was 118/70 in clinic but typically lower at home ~low 100s systolic, per pt report. She stated that she had had a "stressful morning at work" and believed BP was up because of that. She denies any orthostatic symptoms. She reported that she was previously only taking furosemide 3 days a week but recently increased to daily after she developed LEE (now resolved). She was still smoking 1/2- 1 ppd.   Today she returns to HF clinic for pharmacist medication titration. At last visit with [facility], Sherryll Burger was increased to 49/51 mg BID and furosemide and potassium supplements were discontinued. However, her potassium level was 3.2 on labs so potassium chloride 10 mEq daily was restarted.   . Shortness of breath/dyspnea on exertion? {YES [id number]  . Orthopnea/PND? {YES [id number] . Edema? {YES [id number]  . Lightheadedness/dizziness? {YES [id number] . Daily weights at home? {YES [id number] . Blood pressure/heart rate monitoring at home? {YES [id number] . Following low-sodium/fluid-restricted diet? {YES NO:22349}  HF Medications: Carvedilol 3.125 mg BID Entresto 49/51 mg BID Spironolactone 25 mg daily Jardiance 25 mg daily Digoxin 0.125 mg daily Potassium chloride 10 mEq daily  Has the patient been experiencing any side effects to the medications prescribed?  {YES NO:22349}  Does the patient have any problems obtaining medications due to transportation or finances?   {YES NO:22349}  Understanding of regimen: {excellent/good/fair/poor:19665} Understanding of indications: {excellent/good/fair/poor:19665} Potential of compliance: {excellent/good/fair/poor:19665} Patient understands to avoid NSAIDs. Patient understands to avoid decongestants.    Pertinent Lab Values: . Serum creatinine ***, BUN ***, Potassium ***, Sodium ***, BNP ***, Magnesium ***, Digoxin ***   Vital Signs: . Weight: *** (last clinic weight: ***) . Blood pressure: ***  . Heart rate: ***   Assessment: 1. Chronic Systolic Heart Failure due to iCM: - Echo 5/21 w/ EF 20% with severe RV dysfunction.R/LHC showed diffuse non-obstructive CAD with chronic dissection/recannalization of mid LAD. Suspect she had an out of hospital MI at some point.  There were no interventional targets. - cMRI 8/21: LVEF 30% priro LAD & RCA infarcts. RV ok.  - Not taking any diuretic - Continue carvedilol 3.125 mg BID - Continue Entresto 49-51 mg BID  - Continue Spironolactone 25 mg daily - Continue Jardiance 25 mg daily.  - Continue digoxin 0.125 mg daily.  - Continue gradual med titration until fully optimized (may be limited by BP). Will plan repeat 2D echo once on maximally tolerated  meds. Refer to EP for ICD if EF remains < 35%.   2. CAD: - LHC 5/21 showed diffuse non-obstructive CAD with chronic dissection/recannalization of mid  LAD. Suspect she had an out of hospital MI at some point.  There were no interventional targets. - cMRI as above c/w prior infarcts in LAD/RCA territories - she denies ischemic CP  - continue ASA/statin and ? blocker - complete smoking cessation strongly advised.   3. Type 2 DM: management per PCP - continue Metformin - continue Jardiance  4. Tobacco Abuse: still smoking 1/2-2ppm - discussed importance of smoking cessation  5. HLD:  - LDL goal <70 (98 on recent FLP 6/21).  - Continue atorvastatin 80 qhs. - encouraged low fat diet and increase in physical activity    Plan: 1) Medication changes: Based on clinical presentation, vital signs and recent labs will *** 2) Labs: *** 3) Follow-up: ***   Karle Plumber, PharmD, BCPS, BCCP, CPP Heart Failure Clinic Pharmacist [phone]

## 2020-05-28 ENCOUNTER — Telehealth (HOSPITAL_COMMUNITY): Payer: Self-pay | Admitting: Internal Medicine

## 2020-05-28 NOTE — Telephone Encounter (Signed)
Pt r/s appt 12/21 to Jan 3rd, she is asking if appt can be virtual, she can be reached [phone] , please advise

## 2020-05-29 ENCOUNTER — Inpatient Hospital Stay (HOSPITAL_COMMUNITY): Admission: RE | Admit: 2020-05-29 | Payer: 59 | Source: Ambulatory Visit

## 2020-06-10 NOTE — Progress Notes (Incomplete)
***In Progress*** Referring Physician: PCP: Dema Severin, NP  Cardiologist: Dr. Eldridge Dace HF: Dr. Gala Romney   HPI:  Shelly [name] is a 50 year old woman with DM2, tobacco abuse, CAD, and systolic HF diagnosed in 5/21 with EF 20%.  She was admitted 5/21 with acute HF. Echo EF 20% with severe RV dysfunction.R/LHC showed diffuse non-obstructive CAD with chronic dissection/recannalization of mid LAD. Suspect she had an out of hospital MI at some point.  There were no interventional targets.  Outpatient cMRI  8/21 1. Mild LV dilatation with severe systolic dysfunction (LVEF 30%) 2. Subendocardial LGE c/w prior LAD and RCA infarcts. >50% transmurality of LGE suggesting nonviability in basal to mid inferior walls, apical anterior/septal walls, and apex. <50% transmurality of LGE suggesting viability in mid anterior wall. 3. Normal RV size and systolic function (RVEF 66%)  On 03/15/20, she presented to clinic for follow-up with Dr. Gala Romney. She was still smoking 1 PPD given very stressful job and family issues. She self-increased furosemide to daily due to swelling. No medication titrations were made due to soft BP.  She presented to clinic on 11/23 for follow up with Robbie Lis, PA-C. Her LEE was resolved after increasing furosemide to daily. BP was 118/70 in office but she reported lower SBP in 100s at home. Denied orthostasis.  Today she returns to HF clinic for pharmacist medication titration. At last visit with APP, Sherryll Burger was increased to 49/51 mg BID and furosemide and KCl 20 mEq daily was stopped. K was low at 3.2 so KCl 10 mEq was started.  118/70, 80, 198 lbs - need BMET Plan A: Increase carvedilol if remains euvolemic (RV recovered on cMRI) Plan B: if BP still up and ok at home, maybe Entresto F/u - 1 month with pharm vs APP if no more changes can be made - plan repeat 2D echo once on maximally tolerated meds. Refer to EP for ICD if EF remains < 35%.  Overall feeling  ***. Dizziness, lightheadedness, fatigue:  Chest pain or palpitations:  How is your breathing?: *** SOB: Able to complete all ADLs. Activity level ***  Weight at home pounds. Takes furosemide/torsemide/bumex *** mg *** daily.  LEE PND/Orthopnea  Appetite *** Low-salt diet:   Physical Exam Cost/affordability of meds  HF Medications: Carvedilol 3.125 mg BID Entresto 49/51 mg BID Spironolactone 25 mg daily Jardiance 25 mg daily Digoxin 0.125 mcg daily Potassium Chloride 10 mEq daily  Has the patient been experiencing any side effects to the medications prescribed?  {YES NO:22349}  Does the patient have any problems obtaining medications due to transportation or finances?   No - [employer]  Understanding of regimen: {excellent/good/fair/poor:19665} Understanding of indications: {excellent/good/fair/poor:19665} Potential of compliance: {excellent/good/fair/poor:19665} Patient understands to avoid NSAIDs. Patient understands to avoid decongestants.    Pertinent Lab Values: . Serum creatinine ***, BUN ***, Potassium ***, Sodium ***, BNP ***, Magnesium ***, Digoxin 0.7 (05/01/20)   Vital Signs: . Weight: *** (last clinic weight: ***) . Blood pressure: ***  . Heart rate: ***   Assessment: 1. Chronic Systolic Heart Failure due to iCM: - Echo 5/21 w/ EF 20% with severe RV dysfunction.R/LHC showed diffuse non-obstructive CAD with chronic dissection/recannalization of mid LAD. Suspect she had an out of hospital MI at some point.  There were no interventional targets. - cMRI 8/21: LVEF 30% prior LAD & RCA infarcts. RV ok. - NYHA II. Volume status *** off furosemide  - Continue carvedilol 3.125 mg BID - Continue Entresto 49/51 mg BID -  Continue spironolactone 25 mg daily - Continue Jardiance 25 mg daily - Continue digoxin 0.125 mg daily - Continue KCl 10 mEq daily - continue gradual med titration until fully optimized (may be limited by BP). Will plan repeat  2D echo once on maximally tolerated meds. Refer to EP for ICD if EF remains < 35%.   2. CAD: - LHC 5/21 showed diffuse non-obstructive CAD with chronic dissection/recannalization of mid LAD. Suspect she had an out of hospital MI at some point.  There were no interventional targets. - cMRI as above c/w prior infarcts in LAD/RCA territories - she denies ischemic CP  - continue ASA/statin and carvedilol 3.125 mg BID - complete smoking cessation strongly advised.   3. Type 2 DM: management per PCP - continue Metformin - continue Jardiance  4. Tobacco Abuse: still smoking 1/2-2ppm - discussed importance of smoking cessation  5. HLD:  - LDL goal <70 (44 on recent FLP 11/21).  - Continue atorvastatin 80 daily  F/u in 3-4 weeks for attempt at further med titration    Plan: 1) Medication changes: Based on clinical presentation, vital signs and recent labs will *** 2) Labs: *** 3) Follow-up: ***  Tama Headings, PharmD, BCPS PGY2 Cardiology Pharmacy Resident  Karle Plumber, PharmD, BCPS, [facility], CPP Heart Failure Clinic Pharmacist [phone]

## 2020-06-11 ENCOUNTER — Inpatient Hospital Stay (HOSPITAL_COMMUNITY)
Admission: RE | Admit: 2020-06-11 | Discharge: 2020-06-11 | Disposition: A | Discharge status: Home / Self Care | Payer: 59 | Source: Ambulatory Visit

## 2020-06-26 ENCOUNTER — Ambulatory Visit (HOSPITAL_COMMUNITY): Payer: 59

## 2020-06-30 NOTE — Progress Notes (Incomplete)
***In Progress*** Referring Physician: PCP: Dema Severin, NP  Cardiologist: Dr. Eldridge Dace HF: Dr. Gala Romney   HPI:  Shelly [name] is a 50 year old woman with DM2, tobacco abuse,CAD and systolic HF diagnosed in 5/21 with EF 20%  Admitted 5/21 with acute HF. Echo EF 20% with severe RV dysfunction.R/LHC showed diffuse non-obstructive CAD with chronic dissection/recannalization of mid LAD. Suspect she had an out of hospital MI at some point.  There were no interventional targets.  Outpatient cMRI  8/21 1. Mild LV dilatation with severe systolic dysfunction (LVEF 30%) 2. Subendocardial LGE c/w prior LAD and RCA infarcts. >50% transmurality of LGE suggesting nonviability in basal to mid inferior walls, apical anterior/septal walls, and apex. <50% transmurality of LGE suggesting viability in mid anterior wall. 3. Normal RV size and systolic function (EF 66%)  She presented to clinic for follow-up with Robbie Lis, PA-C. Her last visit was 10/21 and medications were not titrated due to soft BP. Denied dyspnea. No CP. No LEE, orthopnea or PND. Wt was stable. BP in clinic was 118/70 but SBP typically lower at home in low-100s systolic. She stated she had a "stressful morning at work" and thought BP was up because of that. She denied orthostatic symptoms. She reported that she was previously only taking furosemide 3 days per week but recently increased to daily after she developed LEE (now resolved).   Today she returns to HF clinic for pharmacist medication titration. At last visit with APP, Sherryll Burger was increased to 49/51 mg BID, furosemide discontinued, and KCl 10 mEq daily started.   118/70, 80, 198 lbs - Check BMET, BNP Plan A: carvedilol 6.25 if euvolemic Plan B: maybe later Entresto  Overall feeling ***. Dizziness, lightheadedness, fatigue:  Chest pain or palpitations:  How is your breathing?: *** SOB: Able to complete all ADLs. Activity level ***  Weight at home pounds.  Takes furosemide/torsemide/bumex *** mg *** daily.  LEE PND/Orthopnea  Appetite *** Low-salt diet:   Physical Exam Cost/affordability of meds  HF Medications: Carvedilol 3.125 mg BID Entresto 49/51 mg BID Spironolactone 25 mg daily Jardiance 25 mg daily Digoxin 0.125 mg daily Klor-Con 10 mEq daily  Has the patient been experiencing any side effects to the medications prescribed?  {YES NO:22349}  Does the patient have any problems obtaining medications due to transportation or finances?   {YES NO:22349}  Understanding of regimen: {excellent/good/fair/poor:19665} Understanding of indications: {excellent/good/fair/poor:19665} Potential of compliance: {excellent/good/fair/poor:19665} Patient understands to avoid NSAIDs. Patient understands to avoid decongestants.    Pertinent Lab Values: . Serum creatinine ***, BUN ***, Potassium ***, Sodium ***, BNP ***, Magnesium ***, Digoxin ***   Vital Signs: . Weight: *** (last clinic weight: ***) . Blood pressure: ***  . Heart rate: ***   Assessment/Plan: 1. Chronic Systolic Heart Failure due to iCM: - Echo 5/21 w/ EF 20% with severe RV dysfunction.R/LHC showed diffuse non-obstructive CAD with chronic dissection/recannalization of mid LAD. Suspect she had an out of hospital MI at some point.  There were no interventional targets. - cMRI 8/21: LVEF 30% priro LAD & RCA infarcts. RV ok  - NYHA II. Volume status ok off furosemide - Continue carvedilol 3.125 mg BID - Continue Entresto 49/51 mg BID - Continue spironolactone 25 mg daily - Continue Jardiance 25 mg daily - Continue digoxin 0.125 mg daily - Continue KCL 10 mEq daily - Plans noted to repeat 2D echo once on maximally tolerated meds and refer to EP for ICD if EF remains < 35%.  2. CAD: - LHC 5/21 showed diffuse non-obstructive CAD with chronic dissection/recannalization of mid LAD. Suspect she had an out of hospital MI at some point.  There were no interventional  targets. - cMRI as above c/w prior infarcts in LAD/RCA territories. Denies ischemic CP  - Continue aspirin 81 mg daily - Continue atorvastatin 80 mg daily - Continue carvedilol 3.125 mg BID - Complete smoking cessation strongly advised.   3. Type 2 DM: management per PCP - Continue Metformin - Continue Jardiance  4. Tobacco Abuse: still smoking 1/2-2ppm - Discussed importance of smoking cessation  5. HLD: LDL goal <70 (44 on recent lipid panel 05/01/20).  - Continue atorvastatin 80 mg daily.   Karle Plumber, PharmD, BCPS, BCCP, CPP Heart Failure Clinic Pharmacist [phone]

## 2020-07-02 ENCOUNTER — Other Ambulatory Visit: Payer: Self-pay

## 2020-07-02 ENCOUNTER — Ambulatory Visit (HOSPITAL_COMMUNITY)
Admission: RE | Admit: 2020-07-02 | Discharge: 2020-07-02 | Disposition: A | Discharge status: Home / Self Care | Payer: 59 | Source: Ambulatory Visit | Attending: Internal Medicine | Admitting: Internal Medicine

## 2020-07-02 VITALS — BP 138/82 | HR 60 | Wt 197.2 lb

## 2020-07-02 DIAGNOSIS — I5022 Chronic systolic (congestive) heart failure: Secondary | ICD-10-CM | POA: Insufficient documentation

## 2020-07-02 DIAGNOSIS — I2510 Atherosclerotic heart disease of native coronary artery without angina pectoris: Secondary | ICD-10-CM | POA: Insufficient documentation

## 2020-07-02 DIAGNOSIS — E785 Hyperlipidemia, unspecified: Secondary | ICD-10-CM | POA: Diagnosis not present

## 2020-07-02 DIAGNOSIS — I255 Ischemic cardiomyopathy: Secondary | ICD-10-CM | POA: Insufficient documentation

## 2020-07-02 DIAGNOSIS — E119 Type 2 diabetes mellitus without complications: Secondary | ICD-10-CM | POA: Insufficient documentation

## 2020-07-02 DIAGNOSIS — F17210 Nicotine dependence, cigarettes, uncomplicated: Secondary | ICD-10-CM | POA: Diagnosis not present

## 2020-07-02 LAB — BASIC METABOLIC PANEL
Anion gap: 12 (ref 5–15)
BUN: 5 mg/dL — ABNORMAL LOW (ref 6–20)
CO2: 30 mmol/L (ref 22–32)
Calcium: 8.8 mg/dL — ABNORMAL LOW (ref 8.9–10.3)
Chloride: 97 mmol/L — ABNORMAL LOW (ref 98–111)
Creatinine, Ser: 0.79 mg/dL (ref 0.44–1.00)
GFR, Estimated: >60 mL/min (ref >60)
Glucose, Bld: 205 mg/dL — ABNORMAL HIGH (ref 70–99)
Potassium: 2.9 mmol/L — ABNORMAL LOW (ref 3.5–5.1)
Sodium: 139 mmol/L (ref 135–145)

## 2020-07-02 LAB — BRAIN NATRIURETIC PEPTIDE: B Natriuretic Peptide: 551.5 pg/mL — ABNORMAL HIGH (ref 0.0–100.0)

## 2020-07-02 MED ORDER — POTASSIUM CHLORIDE ER 10 MEQ PO TBCR
40.0000 meq | EXTENDED_RELEASE_TABLET | Freq: Every day | ORAL | 11 refills | Status: DC
Start: 2020-07-02 — End: 2021-01-15

## 2020-07-02 NOTE — Progress Notes (Signed)
Referring Physician: PCP: Dema Severin, NP  Cardiologist: Dr. Eldridge Dace HF: Dr. Gala Romney   HPI:  Shelly [name] is a 50 year old woman with DM2, tobacco abuse,CAD and systolic HF diagnosed in 5/21 with EF 20%  Admitted 5/21 with acute HF. Echo EF 20% with severe RV dysfunction.R/LHC showed diffuse non-obstructive CAD with chronic dissection/recannalization of mid LAD. Suspect she had an out of hospital MI at some point.  There were no interventional targets.  Outpatient cMRI  8/21 1. Mild LV dilatation with severe systolic dysfunction (LVEF 30%) 2. Subendocardial LGE c/w prior LAD and RCA infarcts. >50% transmurality of LGE suggesting nonviability in basal to mid inferior walls, apical anterior/septal walls, and apex. <50% transmurality of LGE suggesting viability in mid anterior wall. 3. Normal RV size and systolic function (EF 66%)  She presented to clinic for follow-up with Shelly Lis, PA-C on 05/01/20. Her last visit was 10/21 and medications were not titrated due to soft BP. Denied dyspnea. No CP. No LEE, orthopnea or PND. Wt was stable. BP in clinic was 118/70 but SBP typically lower at home in low-100s systolic. She stated she had a "stressful morning at work" and thought BP was up because of that. She denied orthostatic symptoms. She reported that she was previously only taking furosemide 3 days per week but recently increased to daily after she developed LEE (now resolved).   Today she returns to HF clinic for pharmacist medication titration. At last visit with APP, Shelly [name] was increased to 49/51 mg BID, furosemide discontinued, and KCl 10 mEq daily started. Today, she is feeling all right. She restarted furosemide 20 mg daily due to increased LEE and reports that it gets worse when she's on her feet all day. She reports swelling of her ankles are particularly bad today. Is not wearing her compression stockings. Does not get dizziness or lightheaded normally, but she reports  falling and hitting her head in December when she got out of bed too quickly. BP in office is higher at 138/82, but her home BP is normally 90/50s per pt-report. She feels very fatigued but attributes this to her daily activities. No chest pain or palpitations. Her breathing has improved over the past few months. Weight at home fluctuates 193-199 lbs. Denies PND/orthopnea. Adheres to low-salt diet.  HF Medications: Carvedilol 3.125 mg BID Entresto 49/51 mg BID Spironolactone 25 mg daily Jardiance 25 mg daily Digoxin 0.125 mg daily Furosemide 20 mg daily Klor-Con 10 mEq daily  Has the patient been experiencing any side effects to the medications prescribed?  no  Does the patient have any problems obtaining medications due to transportation or finances?   No - Has [employer], uses copay cards for [employer].  Understanding of regimen: good Understanding of indications: good Potential of compliance: good Patient understands to avoid NSAIDs. Patient understands to avoid decongestants.    Pertinent Lab Values: . Serum creatinine 0.79, BUN 5, Potassium 2.9, Sodium 139, BNP 551.5  Vital Signs: . Weight: 197.2 lbs (last clinic weight: 198 lbs) . Blood pressure: 138/82  . Heart rate: 60   Assessment/Plan: 1. Chronic Systolic Heart Failure due to iCM: - Echo 5/21 w/ EF 20% with severe RV dysfunction.R/LHC showed diffuse non-obstructive CAD with chronic dissection/recannalization of mid LAD. Suspect she had an out of hospital MI at some point.  There were no interventional targets. - cMRI 8/21: LVEF 30% priro LAD & RCA infarcts. RV ok  - NYHA II symptoms. Bilateral 1+ pitting edema in ankles.  BNP elevated above her baseline at 551.5 pg/mL. - Take an extra dose of furosemide 20 mg today, then continue furosemide 20 mg daily. Needs to wear compression stockings consistently.  - Labs returned after patient visit completed. Scr 0.79, K 2.9. Instructed patient to increase  KCL to 40 mEq daily and repeat BMET in 1 week. - Continue carvedilol 3.125 mg BID. Will not increase today given increased volume status. - Continue Entresto 49/51 mg BID. Will not increase given low home BP readings and recent episode of orthostasis and fall.  - Continue spironolactone 25 mg daily - Continue Jardiance 25 mg daily - Continue digoxin 0.125 mg daily - Plans noted to repeat 2D echo once on maximally tolerated meds and refer to EP for ICD if EF remains < 35%.   2. CAD: - LHC 5/21 showed diffuse non-obstructive CAD with chronic dissection/recannalization of mid LAD. Suspect she had an out of hospital MI at some point.  There were no interventional targets. - cMRI as above c/w prior infarcts in LAD/RCA territories. Denies ischemic CP  - Continue aspirin 81 mg daily - Continue atorvastatin 80 mg daily - Continue carvedilol 3.125 mg BID - Complete smoking cessation strongly advised.   3. Type 2 DM: management per PCP - Continue Metformin - Continue Jardiance  4. Tobacco Abuse: still smoking 1/2-2ppm - Discussed importance of smoking cessation  5. HLD: LDL goal <70 (44 on recent lipid panel 05/01/20).  - Continue atorvastatin 80 mg daily.  Plan: 1) Medication changes: based on clinical presentation, vital signs and recent labs will have patient take an extra dose of furosemide 20 mg tonight, then continue furosemide 20 mg daily. Increase potassium to 40 mEq daily. 2) Labs: BMET in 1 week 3) Follow-up in 6 weeks with [facility] on 08/13/20.  Tama Headings, PharmD, BCPS PGY2 Cardiology Pharmacy Resident  Karle Plumber, PharmD, BCPS, [facility], CPP Heart Failure Clinic Pharmacist [phone]

## 2020-07-02 NOTE — Patient Instructions (Addendum)
It was a pleasure seeing you today!  MEDICATIONS: -Take furosemide 20 mg tonight, then resume furosemide 20 mg daily. -Call if you have questions about your medications.  LABS: -We will call you if your labs need attention.  NEXT APPOINTMENT: Return to clinic in 6 weeks on 08/13/20 with [facility].  In general, to take care of your heart failure: -Limit your fluid intake to 2 Liters (half-gallon) per day.   -Limit your salt intake to ideally 2-3 grams (2000-3000 mg) per day. -Weigh yourself daily and record, and bring that "weight diary" to your next appointment.  (Weight gain of 2-3 pounds in 1 day typically means fluid weight.) -The medications for your heart are to help your heart and help you live longer.   -Please contact us before stopping any of your heart medications.  Call the clinic at [phone] with questions or to reschedule future appointments.

## 2020-07-09 ENCOUNTER — Other Ambulatory Visit (HOSPITAL_COMMUNITY): Payer: 59

## 2020-07-11 NOTE — Progress Notes (Signed)
[facility] - Clinic Note  07/13/2020     CHIEF COMPLAINT Patient presents for Retina Evaluation   HISTORY OF PRESENT ILLNESS: Shelly [name] is a 50 y.o. female who presents to the clinic today for:   HPI    Retina Evaluation    In both eyes.  Associated Symptoms Floaters.  I, the attending physician,  performed the HPI with the patient and updated documentation appropriately.          Comments    Retina eval per Dr. Conley RollsLe- Decreased vision that has gradually worsened x1 year.  Patient states she only sees light out of left eye.  She thinks it is cataracts.  Dr. Conley RollsLe told pt she has diabetes behind OD.  DM2 x1 year being treated.  LBS 149 this am A1C went from 11 to 9.  Pt states at times she will see a "ring of lights" doesn't last long and unsure which eye.  10/2018 she had a heart attack and lost 24lbs while in the hospital.  She has lost a total of 60lbs since first Dx with diabetes.        Last edited by Rennis ChrisZamora, Clester Chlebowski, MD on 07/13/2020 12:12 PM. (History)    pt is here on the referral of Dr. Aletta EdouardMy Le for concern of decreased VA OU, pt states she had a heart attack last May and was hospitalized, she states she was dx with diabetes last year and was put on medication, she states she has probably been diabetic longer than that, she states she is trying to get her diabetes under control, but it is a slow process, she states her BP usually runs low  Referring physician: Conley RollsLe, My [place], OD [address] [place] [id number]  HISTORICAL INFORMATION:   Selected notes from the MEDICAL RECORD NUMBER Referred by Dr. Aletta EdouardMy Le for concern of decreased VA OU LEE:  Ocular Hx- PMH-    CURRENT MEDICATIONS: Current Outpatient Medications (Ophthalmic Drugs)  Medication Sig  . prednisoLONE acetate (PRED FORTE) 1 % ophthalmic suspension Place 1 drop into the right eye 4 (four) times daily for 7 days.   No current facility-administered medications for this visit. (Ophthalmic  Drugs)   Current Outpatient Medications (Other)  Medication Sig  . aspirin EC 81 MG tablet Take 1 tablet (81 mg total) by mouth daily.  Marland Kitchen. atorvastatin (LIPITOR) 80 MG tablet Take 1 tablet (80 mg total) by mouth daily.  . carvedilol (COREG) 3.125 MG tablet Take 1 tablet (3.125 mg total) by mouth 2 (two) times daily.  . D3-50 1.25 MG (50000 UT) capsule Take by mouth.  . digoxin (LANOXIN) 0.125 MG tablet Take 1 tablet (125 mcg total) by mouth daily.  . Dulaglutide (TRULICITY) 1.5 MG/0.5ML SOPN Inject into the skin once a week.  . empagliflozin (JARDIANCE) 25 MG TABS tablet Take 1 tablet (25 mg total) by mouth daily.  . furosemide (LASIX) 20 MG tablet Take 20 mg by mouth daily.  . metFORMIN (GLUCOPHAGE) 1000 MG tablet Take 1 tablet by mouth in the morning and at bedtime.  . potassium chloride (KLOR-CON) 10 MEQ tablet Take 4 tablets (40 mEq total) by mouth daily.  . sacubitril-valsartan (ENTRESTO) 49-51 MG Take 1 tablet by mouth 2 (two) times daily.  Marland Kitchen. spironolactone (ALDACTONE) 25 MG tablet Take 1 tablet (25 mg total) by mouth daily.  . vitamin B-12 (CYANOCOBALAMIN) 1000 MCG tablet Take 1,000 mcg by mouth daily. Taking once a day   No current  facility-administered medications for this visit. (Other)      REVIEW OF SYSTEMS: ROS    Positive for: Cardiovascular, Eyes   Negative for: Constitutional, Gastrointestinal, Neurological, Skin, Genitourinary, Musculoskeletal, HENT, Endocrine, Respiratory, Psychiatric, Allergic/Imm, Heme/Lymph   Last edited by Joni Reining, COA on 07/13/2020  9:05 AM. (History)       ALLERGIES No Known Allergies  PAST MEDICAL HISTORY Past Medical History:  Diagnosis Date  . CHF (congestive heart failure) (HCC)   . Diabetes mellitus without complication (HCC)   . Tobacco abuse    Past Surgical History:  Procedure Laterality Date  . RIGHT/LEFT HEART CATH AND CORONARY ANGIOGRAPHY N/A 10/14/2019   Procedure: RIGHT/LEFT HEART CATH AND CORONARY ANGIOGRAPHY;   Surgeon: Dolores Patty, MD;  Location: [facility] INVASIVE CV [facility];  Service: Cardiovascular;  Laterality: N/A;    FAMILY HISTORY Family History  Problem Relation Age of Onset  . Sudden Cardiac Death Mother   . CAD Father   . CAD Paternal Grandmother   . CAD Paternal Grandfather     SOCIAL HISTORY Social History   Tobacco Use  . Smoking status: Current Every Day Smoker  . Smokeless tobacco: Never Used  Vaping Use  . Vaping Use: Never used  Substance Use Topics  . Alcohol use: Not Currently  . Drug use: Never         OPHTHALMIC EXAM:  Base Eye Exam    Visual Acuity (Snellen - Linear)      Right Left   Dist [place] 20/150- CF 4'   Dist ph [place] NI NI       Tonometry (Tonopen, 9:16 AM)      Right Left   Pressure 12 15       Pupils      Dark Light Shape React APD   Right 4 3 Round Slow None   Left 4 3 Round Slow None       Visual Fields (Counting fingers)      Left Right    Full Full       Extraocular Movement      Right Left    Full Full       Neuro/Psych    Oriented x3: Yes   Mood/Affect: Normal       Dilation    Both eyes: 1.0% Mydriacyl, 2.5% Phenylephrine @ 9:17 AM        Slit Lamp and Fundus Exam    Slit Lamp Exam      Right Left   Lids/Lashes Dermatochalasis - upper lid, Meibomian gland dysfunction Dermatochalasis - upper lid, Meibomian gland dysfunction   Conjunctiva/Sclera White and quiet White and quiet   Cornea 1+ Punctate epithelial erosions 1+ Punctate epithelial erosions   Anterior Chamber Deep and quiet Deep and quiet   Iris Round and dilated, No NVI Round and dilated, No NVI   Lens 1-2+ Nuclear sclerosis, 2+ Cortical cataract 1-2+ Nuclear sclerosis, 2+ Cortical cataract   Vitreous blood stained vitreous condensations severe fibrosis, vitreous sheet temporal periphery with subhyaloid heme       Fundus Exam      Right Left   Disc mild Pallor, +fibrosis, +NVD +NVD obscured by fibrosis   Macula Blunted foveal reflex, +edema, +NV NVE  obscuring view, +SRF/TRD   Vessels attenuated, Tortuous, +NV 360 attenuated, Tortuous, +NVE 360   Periphery Attached, 360 DBH, +fibrosis related to NVE 360 DBH; +fibrosis; Temporal periphery obscured by large subhyaloid heme        Refraction    Manifest  Refraction      Sphere Dist VA   Right [place] NI   Left [place] NI          IMAGING AND PROCEDURES  Imaging and Procedures for 07/13/2020  OCT, Retina - OU - Both Eyes       Right Eye Quality was good. Central Foveal Thickness: 599. Progression has no prior data. Findings include abnormal foveal contour, intraretinal fluid, preretinal fibrosis, epiretinal membrane, macular pucker, vitreous traction, no SRF (Severe tractional fibrosis and edema).   Left Eye Quality was good. Central Foveal Thickness: 571. Progression has no prior data. Findings include abnormal foveal contour, vitreous traction, subretinal fluid, macular pucker, epiretinal membrane, intraretinal fluid (Severe tractional fibrosis and edema, TRD).   Notes *Images captured and stored on drive  Diagnosis / Impression:  OD: Severe tractional fibrosis and edema OS: Severe tractional fibrosis and edema, +TRD  Clinical management:  See below  Abbreviations: NFP - Normal foveal profile. CME - cystoid macular edema. PED - pigment epithelial detachment. IRF - intraretinal fluid. SRF - subretinal fluid. EZ - ellipsoid zone. ERM - epiretinal membrane. ORA - outer retinal atrophy. ORT - outer retinal tubulation. SRHM - subretinal hyper-reflective material. IRHM - intraretinal hyper-reflective material        Fluorescein Angiography Optos (Transit OS)       Right Eye   Progression has no prior data. Early phase findings include (No early images -- first image started at 0:09). Mid/Late phase findings include neovascularization disc, retinal neovascularization, microaneurysm, leakage, vascular perfusion defect.   Left Eye   Progression has no prior data. Early phase  findings include vascular perfusion defect, retinal neovascularization, blockage, leakage. Mid/Late phase findings include retinal neovascularization, vascular perfusion defect, microaneurysm, pooling, leakage ([place] NV 360 with temporal hemisphere obscured by subhyaloid heme with +pooling).   Notes **Images stored on drive**  Impression: PDR OU  Florid NV with leakage and pooling OU          Panretinal Photocoagulation - OD - Right Eye       LASER PROCEDURE NOTE  Diagnosis:   Proliferative Diabetic Retinopathy, RIGHT EYE  Procedure:  Pan-retinal photocoagulation using slit lamp laser, RIGHT EYE  Anesthesia:  Topical  Surgeon: [name], MD, PhD   Informed consent obtained, operative eye marked, and time out performed prior to initiation of laser.   Lumenis [id number] slit lamp laser Pattern: 3x3 square Power: 270 mW Duration: 30 msec  Spot size: 200 microns  # spots: 2167 spots  Complications: None.  Notes: vitreous heme obscuring view and preventing laser up take inferiorly and scattered focal areas  RTC: 1 wk for PRP OS  Patient tolerated the procedure well and received written and verbal post-procedure care information/education.         Intravitreal Injection, Pharmacologic Agent - OS - Left Eye       Time Out 07/13/2020. 11:00 AM. Confirmed correct patient, procedure, site, and patient consented.   Anesthesia Topical anesthesia was used. Anesthetic medications included Lidocaine 2%, Proparacaine 0.5%.   Procedure Preparation included 5% betadine to ocular surface, eyelid speculum. A supplied (32g) needle was used.   Injection:  2 mg aflibercept Gretta Cool) SOLN   NDC: 59935-701-77, Lot: [id number], Expiration date: 12/07/2020   Route: Intravitreal, Site: Left Eye, Waste: 0.05 mL  Post-op Post injection exam found visual acuity of at least counting fingers. The patient tolerated the procedure well. There were no complications. The patient received  written and verbal post procedure care education. Post injection medications  were not given.   Notes **SAMPLE MEDICATION ADMINISTERED**                ASSESSMENT/PLAN:    ICD-10-CM   1. Proliferative diabetic retinopathy of both eyes with macular edema associated with type 2 diabetes mellitus (HCC)  N35.6701 Panretinal Photocoagulation - OD - Right Eye    Intravitreal Injection, Pharmacologic Agent - OS - Left Eye    aflibercept (EYLEA) SOLN 2 mg  2. Retinal edema  H35.81 OCT, Retina - OU - Both Eyes  3. 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 Essential hypertension  I10   5. Hypertensive retinopathy of both eyes  H35.033 Fluorescein Angiography Optos (Transit OS)  6. Combined forms of age-related cataract of both eyes  H25.813     1-3. Severe proliferative diabetic retinopathy with edema OU  - OD: severe tractional fibrosis and edema, no TRD  - OS:  With TRD and subhyaloid heme - The incidence, risk factors for progression, natural history and treatment options for diabetic retinopathy were discussed with patient.   - The need for close monitoring of blood glucose, blood pressure, and serum lipids, avoiding cigarette or any type of tobacco, and the need for long term follow up was also discussed with patient. - FA (02.04.22) shows florid NV with leakage and pooling OU (OS > OD) - OCT shows severe tractional fibrosis and edema OU, OS with TRD - The natural history, pathology, and characteristics of diabetic macular edema discussed with patient.  A generalized discussion of the major clinical trials concerning treatment of diabetic macular edema (ETDRS, DCT, SCORE, RISE / RIDE, and ongoing DRCR net studies) was completed.  This discussion included mention of the various approaches to treating diabetic macular edema (observation, laser photocoagulation, anti-VEGF injections with lucentis / Avastin / Eylea, steroid injections with Kenalog / Ozurdex, and intraocular surgery  with vitrectomy).  The goal hemoglobin A1C of 6-7 was discussed, as well as importance of smoking cessation and hypertension control.  Need for ongoing treatment and monitoring were specifically discussed with reference to chronic nature of diabetic macular edema. - discussed findings and severity of disease, guarded prognosis and extensive treatments and follow ups need to stabilize eyes and improve vision - recommend IVE #1 OS (sample) and PRP OD today, 02.04.22 - pt wishes to proceed with both procedures  - RBA of procedure discussed, questions answered - informed consent obtained and signed - see procedure note - f/u 1 wk, Tues or Weds -- DFE/OCT, PRP OS, possible injection OD  4,5. Hypertensive retinopathy OU - discussed importance of tight BP control - monitor  6. Mixed Cataract OU - The symptoms of cataract, surgical options, and treatments and risks were discussed with patient. - discussed diagnosis and progression - not yet visually significant - monitor for now    Ophthalmic Meds Ordered this visit:  Meds ordered this encounter  Medications  . prednisoLONE acetate (PRED FORTE) 1 % ophthalmic suspension    Sig: Place 1 drop into the right eye 4 (four) times daily for 7 days.    Dispense:  10 mL    Refill:  0  . aflibercept (EYLEA) SOLN 2 mg       Return in about 1 week (around 07/20/2020) for f/u next Tues/Weds, PDR OU, DFE, OCT.  There are no Patient Instructions on file for this visit.   Explained the diagnoses, plan, and follow up with the patient and they expressed understanding.  Patient expressed understanding of the importance  of proper follow up care.   This document serves as a record of services personally performed by Karie Chimera, MD, PhD. It was created on their behalf by Glee Arvin. Manson Passey, OA an ophthalmic technician. The creation of this record is the provider's dictation and/or activities during the visit.    Electronically signed by: Glee Arvin.  Manson Passey, [place] 02.02.2022 12:29 PM   Karie Chimera, M.D., Ph.D. Diseases & Surgery of the Retina and Vitreous [facility]  I have reviewed the above documentation for accuracy and completeness, and I agree with the above. Karie Chimera, M.D., Ph.D. 07/13/20 12:29 PM   Abbreviations: M myopia (nearsighted); A astigmatism; H hyperopia (farsighted); P presbyopia; Mrx spectacle prescription;  CTL contact lenses; OD right eye; OS left eye; OU both eyes  XT exotropia; ET esotropia; PEK punctate epithelial keratitis; PEE punctate epithelial erosions; DES dry eye syndrome; MGD meibomian gland dysfunction; ATs artificial tears; PFAT's preservative free artificial tears; NSC nuclear sclerotic cataract; PSC posterior subcapsular cataract; ERM epi-retinal membrane; PVD posterior vitreous detachment; RD retinal detachment; DM diabetes mellitus; DR diabetic retinopathy; NPDR non-proliferative diabetic retinopathy; PDR proliferative diabetic retinopathy; CSME clinically significant macular edema; DME diabetic macular edema; dbh dot blot hemorrhages; CWS cotton wool spot; POAG primary open angle glaucoma; C/D cup-to-disc ratio; HVF humphrey visual field; GVF goldmann visual field; OCT optical coherence tomography; IOP intraocular pressure; BRVO Branch retinal vein occlusion; CRVO central retinal vein occlusion; CRAO central retinal artery occlusion; BRAO branch retinal artery occlusion; RT retinal tear; SB scleral buckle; PPV pars plana vitrectomy; VH Vitreous hemorrhage; PRP panretinal laser photocoagulation; IVK intravitreal kenalog; VMT vitreomacular traction; MH Macular hole;  NVD neovascularization of the disc; NVE neovascularization elsewhere; AREDS age related eye disease study; ARMD age related macular degeneration; POAG primary open angle glaucoma; EBMD epithelial/anterior basement membrane dystrophy; ACIOL anterior chamber intraocular lens; IOL intraocular lens; PCIOL posterior chamber  intraocular lens; Phaco/IOL phacoemulsification with intraocular lens placement; PRK photorefractive keratectomy; LASIK laser assisted in situ keratomileusis; HTN hypertension; DM diabetes mellitus; COPD chronic obstructive pulmonary disease

## 2020-07-13 ENCOUNTER — Encounter (INDEPENDENT_AMBULATORY_CARE_PROVIDER_SITE_OTHER): Payer: Self-pay | Admitting: Ophthalmology

## 2020-07-13 ENCOUNTER — Other Ambulatory Visit: Payer: Self-pay

## 2020-07-13 ENCOUNTER — Ambulatory Visit (INDEPENDENT_AMBULATORY_CARE_PROVIDER_SITE_OTHER): Payer: 59 | Admitting: Ophthalmology

## 2020-07-13 DIAGNOSIS — H3342 Traction detachment of retina, left eye: Secondary | ICD-10-CM

## 2020-07-13 DIAGNOSIS — H25813 Combined forms of age-related cataract, bilateral: Secondary | ICD-10-CM

## 2020-07-13 DIAGNOSIS — H3581 Retinal edema: Secondary | ICD-10-CM

## 2020-07-13 DIAGNOSIS — E113513 Type 2 diabetes mellitus with proliferative diabetic retinopathy with macular edema, bilateral: Secondary | ICD-10-CM | POA: Diagnosis not present

## 2020-07-13 DIAGNOSIS — I10 Essential (primary) hypertension: Secondary | ICD-10-CM

## 2020-07-13 DIAGNOSIS — H35033 Hypertensive retinopathy, bilateral: Secondary | ICD-10-CM | POA: Diagnosis not present

## 2020-07-13 MED ORDER — PREDNISOLONE ACETATE 1 % OP SUSP: 1.0000 [drp] | Freq: Four times a day (QID) | OPHTHALMIC | 0 refills | Status: AC

## 2020-07-13 MED ORDER — AFLIBERCEPT 2MG/0.05ML IZ SOLN FOR KALEIDOSCOPE
2.0000 mg | INTRAVITREAL | Status: AC | PRN
  Administered 2020-07-13: 2 mg via INTRAVITREAL

## 2020-07-17 ENCOUNTER — Ambulatory Visit (INDEPENDENT_AMBULATORY_CARE_PROVIDER_SITE_OTHER): Payer: 59 | Admitting: Ophthalmology

## 2020-07-17 ENCOUNTER — Encounter (INDEPENDENT_AMBULATORY_CARE_PROVIDER_SITE_OTHER): Payer: Self-pay | Admitting: Ophthalmology

## 2020-07-17 ENCOUNTER — Other Ambulatory Visit: Payer: Self-pay

## 2020-07-17 DIAGNOSIS — E113513 Type 2 diabetes mellitus with proliferative diabetic retinopathy with macular edema, bilateral: Secondary | ICD-10-CM | POA: Diagnosis not present

## 2020-07-17 DIAGNOSIS — H35033 Hypertensive retinopathy, bilateral: Secondary | ICD-10-CM

## 2020-07-17 DIAGNOSIS — H3342 Traction detachment of retina, left eye: Secondary | ICD-10-CM

## 2020-07-17 DIAGNOSIS — I10 Essential (primary) hypertension: Secondary | ICD-10-CM | POA: Diagnosis not present

## 2020-07-17 DIAGNOSIS — H3581 Retinal edema: Secondary | ICD-10-CM | POA: Diagnosis not present

## 2020-07-17 DIAGNOSIS — H25813 Combined forms of age-related cataract, bilateral: Secondary | ICD-10-CM

## 2020-07-17 MED ORDER — BEVACIZUMAB CHEMO INJECTION 1.25MG/0.05ML SYRINGE FOR KALEIDOSCOPE
1.2500 mg | INTRAVITREAL | Status: AC | PRN
  Administered 2020-07-17: 1.25 mg via INTRAVITREAL

## 2020-07-17 NOTE — Progress Notes (Signed)
[facility] - Clinic Note  07/17/2020     CHIEF COMPLAINT Patient presents for Retina Follow Up   HISTORY OF PRESENT ILLNESS: Shelly [name] is a 50 y.o. female who presents to the clinic today for:   HPI    Retina Follow Up    Patient presents with  Diabetic Retinopathy.  In both eyes.  Duration of 4 days.  Since onset it is gradually improving.  I, the attending physician,  performed the HPI with the patient and updated documentation appropriately.          Comments    4 day follow up PDR OU- Patient thinks eyes are some better but hard to tell.   BS around 145 Using Prednisolone QID OD.        Last edited by [name], MD on 07/17/2020  8:50 AM. (History)    pt here for PRP OS and IVA OD #1 today  Referring physician: Dema Severin, NP [address] [place],  [place] [address]  HISTORICAL INFORMATION:   Selected notes from the MEDICAL RECORD NUMBER Referred by Dr. Aletta Edouard for concern of decreased VA OU LEE:  Ocular Hx- PMH-    CURRENT MEDICATIONS: Current Outpatient Medications (Ophthalmic Drugs)  Medication Sig  . prednisoLONE acetate (PRED FORTE) 1 % ophthalmic suspension Place 1 drop into the right eye 4 (four) times daily for 7 days.   No current facility-administered medications for this visit. (Ophthalmic Drugs)   Current Outpatient Medications (Other)  Medication Sig  . aspirin EC 81 MG tablet Take 1 tablet (81 mg total) by mouth daily.  Marland Kitchen atorvastatin (LIPITOR) 80 MG tablet Take 1 tablet (80 mg total) by mouth daily.  . carvedilol (COREG) 3.125 MG tablet Take 1 tablet (3.125 mg total) by mouth 2 (two) times daily.  . D3-50 1.25 MG (50000 UT) capsule Take by mouth.  . digoxin (LANOXIN) 0.125 MG tablet Take 1 tablet (125 mcg total) by mouth daily.  . Dulaglutide (TRULICITY) 1.5 MG/0.5ML SOPN Inject into the skin once a week.  . empagliflozin (JARDIANCE) 25 MG TABS tablet Take 1 tablet (25 mg total) by mouth daily.  . furosemide (LASIX) 20  MG tablet Take 20 mg by mouth daily.  . metFORMIN (GLUCOPHAGE) 1000 MG tablet Take 1 tablet by mouth in the morning and at bedtime.  . potassium chloride (KLOR-CON) 10 MEQ tablet Take 4 tablets (40 mEq total) by mouth daily.  . sacubitril-valsartan (ENTRESTO) 49-51 MG Take 1 tablet by mouth 2 (two) times daily.  Marland Kitchen spironolactone (ALDACTONE) 25 MG tablet Take 1 tablet (25 mg total) by mouth daily.  . vitamin B-12 (CYANOCOBALAMIN) 1000 MCG tablet Take 1,000 mcg by mouth daily. Taking once a day   No current facility-administered medications for this visit. (Other)      REVIEW OF SYSTEMS: ROS    Positive for: Cardiovascular, Eyes   Negative for: Constitutional, Gastrointestinal, Neurological, Skin, Genitourinary, Musculoskeletal, HENT, Endocrine, Respiratory, Psychiatric, Allergic/Imm, Heme/Lymph   Last edited by Joni Reining, COA on 07/17/2020  8:06 AM. (History)       ALLERGIES No Known Allergies  PAST MEDICAL HISTORY Past Medical History:  Diagnosis Date  . CHF (congestive heart failure) (HCC)   . Diabetes mellitus without complication (HCC)   . Tobacco abuse    Past Surgical History:  Procedure Laterality Date  . RIGHT/LEFT HEART CATH AND CORONARY ANGIOGRAPHY N/A 10/14/2019   Procedure: RIGHT/LEFT HEART CATH AND CORONARY ANGIOGRAPHY;  Surgeon: Dolores Patty,  MD;  Location: [facility];  Service: Cardiovascular;  Laterality: N/A;    FAMILY HISTORY Family History  Problem Relation Age of Onset  . Sudden Cardiac Death Mother   . CAD Father   . CAD Paternal Grandmother   . CAD Paternal Grandfather     SOCIAL HISTORY Social History   Tobacco Use  . Smoking status: Current Every Day Smoker  . Smokeless tobacco: Never Used  Vaping Use  . Vaping Use: Never used  Substance Use Topics  . Alcohol use: Not Currently  . Drug use: Never         OPHTHALMIC EXAM:  Base Eye Exam    Visual Acuity (Snellen - Linear)      Right Left   Dist [place] 20/150 -2 CF4'    Dist ph [place] NI NI       Tonometry (Tonopen, 8:11 AM)      Right Left   Pressure 13 12       Pupils      Dark Light Shape React APD   Right 4 3 Round Minimal None   Left 4 3 Round Minimal None       Visual Fields (Counting fingers)      Left Right    Full Full       Extraocular Movement      Right Left    Full Full       Neuro/Psych    Oriented x3: Yes   Mood/Affect: Normal       Dilation    Both eyes: 1.0% Mydriacyl, 2.5% Phenylephrine @ 8:11 AM        Slit Lamp and Fundus Exam    Slit Lamp Exam      Right Left   Lids/Lashes Dermatochalasis - upper lid, Meibomian gland dysfunction Dermatochalasis - upper lid, Meibomian gland dysfunction   Conjunctiva/Sclera White and quiet White and quiet   Cornea 1+ Punctate epithelial erosions 1+ Punctate epithelial erosions   Anterior Chamber Deep and quiet Deep and quiet   Iris Round and dilated, No NVI Round and dilated, No NVI   Lens 1-2+ Nuclear sclerosis, 2+ Cortical cataract 1-2+ Nuclear sclerosis, 2+ Cortical cataract   Vitreous blood stained vitreous condensations severe fibrosis, vitreous sheet temporal periphery with subhyaloid heme       Fundus Exam      Right Left   Disc mild Pallor, +fibrosis, +NVD +NVD obscured by fibrosis   Macula Blunted foveal reflex, +edema, +NV NVE obscuring view, +SRF/TRD   Vessels attenuated, Tortuous, +NV 360 attenuated, Tortuous, +NVE 360-early regression   Periphery Attached, 360 DBH, +fibrosis related to NVE 360 DBH; +fibrosis; Temporal periphery obscured by large subhyaloid heme          IMAGING AND PROCEDURES  Imaging and Procedures for 07/17/2020  OCT, Retina - OU - Both Eyes       Right Eye Quality was good. Central Foveal Thickness: 661. Progression has been stable. Findings include abnormal foveal contour, intraretinal fluid, preretinal fibrosis, epiretinal membrane, macular pucker, vitreous traction, no SRF (Severe tractional fibrosis and edema).   Left Eye Quality  was good. Central Foveal Thickness: 1242. Progression has been stable. Findings include abnormal foveal contour, vitreous traction, subretinal fluid, macular pucker, epiretinal membrane, intraretinal fluid (Severe tractional fibrosis and edema, +TRD).   Notes *Images captured and stored on drive  Diagnosis / Impression:  OD: Severe tractional fibrosis and edema OS: Severe tractional fibrosis and edema, +TRD  Clinical management:  See below  Abbreviations: NFP - Normal foveal profile. CME - cystoid macular edema. PED - pigment epithelial detachment. IRF - intraretinal fluid. SRF - subretinal fluid. EZ - ellipsoid zone. ERM - epiretinal membrane. ORA - outer retinal atrophy. ORT - outer retinal tubulation. SRHM - subretinal hyper-reflective material. IRHM - intraretinal hyper-reflective material        Intravitreal Injection, Pharmacologic Agent - OD - Right Eye       Time Out 07/17/2020. 8:24 AM. Confirmed correct patient, procedure, site, and patient consented.   Anesthesia Topical anesthesia was used. Anesthetic medications included Lidocaine 2%, Proparacaine 0.5%.   Procedure Preparation included 5% betadine to ocular surface, eyelid speculum. A supplied needle was used.   Injection:  1.25 mg Bevacizumab (AVASTIN) 1.25mg /0.2mL SOLN   NDC: 40981-191-47, Lot: [id number] , Expiration date: 08/15/2020   Route: Intravitreal, Site: Right Eye, Waste: 0 mL  Post-op Post injection exam found visual acuity of at least counting fingers. The patient tolerated the procedure well. There were no complications. The patient received written and verbal post procedure care education. Post injection medications were not given.        Panretinal Photocoagulation - OS - Left Eye       LASER PROCEDURE NOTE  Diagnosis:   Proliferative Diabetic Retinopathy, LEFT EYE  Procedure:  Pan-retinal photocoagulation using slit lamp laser, LEFT EYE  Anesthesia:  Topical  Surgeon: 10/15/2020, MD,  PhD   Informed consent obtained, operative eye marked, and time out performed prior to initiation of laser.   Lumenis [name] slit lamp laser Pattern: 3x3 square Power: 300 mW Duration: 30 msec  Spot size: 200 microns  # spots: 1330 spots mostly nasal hemisphere  Complications: None.  Notes: significant subhyaloid and vitreous heme and tractional fibrosis obscuring view and preventing laser up take in temporal hemisphere and scattered focal areas  RTC: 4 wks for DFE/OCT, possible injection(s)  Patient tolerated the procedure well and received written and verbal post-procedure care information/education.                  ASSESSMENT/PLAN:    ICD-10-CM   1. Proliferative diabetic retinopathy of both eyes with macular edema associated with type 2 diabetes mellitus (HCC)  [id number] Intravitreal Injection, Pharmacologic Agent - OD - Right Eye    Panretinal Photocoagulation - OS - Left Eye    Bevacizumab (AVASTIN) SOLN 1.25 mg  2. Retinal edema  H35.81 OCT, Retina - OU - Both Eyes  3. 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 Essential hypertension  I10   5. Hypertensive retinopathy of both eyes  H35.033   6. Combined forms of age-related cataract of both eyes  H25.813     1-3. Severe proliferative diabetic retinopathy with edema OU  - OD: severe tractional fibrosis and edema, no TRD  - OS:  With TRD and subhyaloid heme  - s/p IVE OS #1 (02.04.22) -- sample  - s/p PRP OD (02.04.22) - FA (02.04.22) shows florid NV with leakage and pooling OU (OS > OD) - OCT shows severe tractional fibrosis and edema OU, OS with TRD - discussed findings and severity of disease, guarded prognosis and extensive treatments and follow ups need to stabilize eyes and improve vision - today, OS with early regression of NV - recommend IVA #1 OD and PRP OS today, 02.08.22 - pt wishes to proceed with both procedures  - RBA of procedure discussed, questions answered - informed consent  obtained and signed - see procedure note - f/u 4 wks, DFE,  OCT, possible injection(s)  4,5. Hypertensive retinopathy OU - discussed importance of tight BP control - monitor  6. Mixed Cataract OU - The symptoms of cataract, surgical options, and treatments and risks were discussed with patient. - discussed diagnosis and progression - not yet visually significant - monitor for now    Ophthalmic Meds Ordered this visit:  Meds ordered this encounter  Medications  . Bevacizumab (AVASTIN) SOLN 1.25 mg       Return in about 4 weeks (around 08/14/2020) for f/u PDR OU, DFE, OCT.  There are no Patient Instructions on file for this visit.   Explained the diagnoses, plan, and follow up with the patient and they expressed understanding.  Patient expressed understanding of the importance of proper follow up care.   This document serves as a record of services personally performed by Karie Chimera, MD, PhD. It was created on their behalf by Glee Arvin. Manson Passey, OA an ophthalmic technician. The creation of this record is the provider's dictation and/or activities during the visit.    Electronically signed by: Glee Arvin. Manson Passey, [place] 02.08.2022 9:00 AM   Karie Chimera, M.D., Ph.D. Diseases & Surgery of the Retina and Vitreous [facility]  I have reviewed the above documentation for accuracy and completeness, and I agree with the above. Karie Chimera, M.D., Ph.D. 07/17/20 9:00 AM   Abbreviations: M myopia (nearsighted); A astigmatism; H hyperopia (farsighted); P presbyopia; Mrx spectacle prescription;  CTL contact lenses; OD right eye; OS left eye; OU both eyes  XT exotropia; ET esotropia; PEK punctate epithelial keratitis; PEE punctate epithelial erosions; DES dry eye syndrome; MGD meibomian gland dysfunction; ATs artificial tears; PFAT's preservative free artificial tears; NSC nuclear sclerotic cataract; PSC posterior subcapsular cataract; ERM epi-retinal membrane; PVD  posterior vitreous detachment; RD retinal detachment; DM diabetes mellitus; DR diabetic retinopathy; NPDR non-proliferative diabetic retinopathy; PDR proliferative diabetic retinopathy; CSME clinically significant macular edema; DME diabetic macular edema; dbh dot blot hemorrhages; CWS cotton wool spot; POAG primary open angle glaucoma; C/D cup-to-disc ratio; HVF humphrey visual field; GVF goldmann visual field; OCT optical coherence tomography; IOP intraocular pressure; BRVO Branch retinal vein occlusion; CRVO central retinal vein occlusion; CRAO central retinal artery occlusion; BRAO branch retinal artery occlusion; RT retinal tear; SB scleral buckle; PPV pars plana vitrectomy; VH Vitreous hemorrhage; PRP panretinal laser photocoagulation; IVK intravitreal kenalog; VMT vitreomacular traction; MH Macular hole;  NVD neovascularization of the disc; NVE neovascularization elsewhere; AREDS age related eye disease study; ARMD age related macular degeneration; POAG primary open angle glaucoma; EBMD epithelial/anterior basement membrane dystrophy; ACIOL anterior chamber intraocular lens; IOL intraocular lens; PCIOL posterior chamber intraocular lens; Phaco/IOL phacoemulsification with intraocular lens placement; PRK photorefractive keratectomy; LASIK laser assisted in situ keratomileusis; HTN hypertension; DM diabetes mellitus; COPD chronic obstructive pulmonary disease

## 2020-07-23 ENCOUNTER — Other Ambulatory Visit (HOSPITAL_COMMUNITY): Payer: 59

## 2020-07-31 ENCOUNTER — Other Ambulatory Visit (HOSPITAL_COMMUNITY): Payer: 59

## 2020-08-06 NOTE — Progress Notes (Addendum)
[facility] - Clinic Note  08/14/2020     CHIEF COMPLAINT Patient presents for Retina Follow Up   HISTORY OF PRESENT ILLNESS: Shelly [name] is a 50 y.o. female who presents to the clinic today for:   HPI    Retina Follow Up    Patient presents with  Diabetic Retinopathy.  In both eyes.  This started weeks ago.  Severity is moderate.  Duration of 4 weeks.  Since onset it is gradually improving.  I, the attending physician,  performed the HPI with the patient and updated documentation appropriately.          Comments    50 y/o female pt here for 4 wk f/u for severe PDR w/DME OU.  Central vision is now blurred OD; VA OS seems slightly improved.  Denies pain, FOL, floaters.  No gtts.  BS 131 this a.m.  A1C 8.2.       Last edited by [name], MD on 08/14/2020  8:53 AM. (History)    pt feels like left eye vision is getting a little better, she states she cannot see out of the center of her right eye at all   Referring physician: Conley Rolls My [place], OD [address] [place],  [place] [address]  HISTORICAL INFORMATION:   Selected notes from the MEDICAL RECORD NUMBER Referred by Dr. Aletta Edouard for concern of decreased VA OU   CURRENT MEDICATIONS: No current outpatient medications on file. (Ophthalmic Drugs)   No current facility-administered medications for this visit. (Ophthalmic Drugs)   Current Outpatient Medications (Other)  Medication Sig  . aspirin EC 81 MG tablet Take 1 tablet (81 mg total) by mouth daily.  Marland Kitchen atorvastatin (LIPITOR) 80 MG tablet Take 1 tablet (80 mg total) by mouth daily.  . carvedilol (COREG) 3.125 MG tablet Take 1 tablet (3.125 mg total) by mouth 2 (two) times daily.  . D3-50 1.25 MG (50000 UT) capsule Take by mouth.  . digoxin (LANOXIN) 0.125 MG tablet Take 1 tablet (125 mcg total) by mouth daily.  . Dulaglutide (TRULICITY) 1.5 MG/0.5ML SOPN Inject into the skin once a week.  . empagliflozin (JARDIANCE) 25 MG TABS tablet Take 1 tablet (25  mg total) by mouth daily.  . furosemide (LASIX) 20 MG tablet Take 20 mg by mouth daily.  . hydrOXYzine (ATARAX/VISTARIL) 25 MG tablet Take 25 mg by mouth 2 (two) times daily.  . metFORMIN (GLUCOPHAGE) 1000 MG tablet Take 1 tablet by mouth in the morning and at bedtime.  . potassium chloride (KLOR-CON) 10 MEQ tablet Take 4 tablets (40 mEq total) by mouth daily.  . sacubitril-valsartan (ENTRESTO) 49-51 MG Take 1 tablet by mouth 2 (two) times daily.  Marland Kitchen spironolactone (ALDACTONE) 25 MG tablet Take 1 tablet (25 mg total) by mouth daily.  . vitamin B-12 (CYANOCOBALAMIN) 1000 MCG tablet Take 1,000 mcg by mouth daily. Taking once a day   No current facility-administered medications for this visit. (Other)      REVIEW OF SYSTEMS: ROS    Positive for: Endocrine, Cardiovascular, Eyes   Negative for: Constitutional, Gastrointestinal, Neurological, Skin, Genitourinary, Musculoskeletal, HENT, Respiratory, Psychiatric, Allergic/Imm, Heme/Lymph   Last edited by Celine Mans, COA on 08/14/2020  8:35 AM. (History)       ALLERGIES No Known Allergies  PAST MEDICAL HISTORY Past Medical History:  Diagnosis Date  . Cataract    Mixed OU  . CHF (congestive heart failure) (HCC)   . Diabetes mellitus without complication (HCC)   .  Diabetic retinopathy (HCC)    PDR OU  . Hypertensive retinopathy    OU  . Tobacco abuse    Past Surgical History:  Procedure Laterality Date  . RIGHT/LEFT HEART CATH AND CORONARY ANGIOGRAPHY N/A 10/14/2019   Procedure: RIGHT/LEFT HEART CATH AND CORONARY ANGIOGRAPHY;  Surgeon: Dolores Patty, MD;  Location: [facility]VASIVE [facility];  Service: Cardiovascular;  Laterality: N/A;    FAMILY HISTORY Family History  Problem Relation Age of Onset  . Sudden Cardiac Death Mother   . CAD Father   . CAD Paternal Grandmother   . CAD Paternal Grandfather     SOCIAL HISTORY Social History   Tobacco Use  . Smoking status: Current Every Day Smoker  . Smokeless tobacco: Never  Used  Vaping Use  . Vaping Use: Never used  Substance Use Topics  . Alcohol use: Not Currently  . Drug use: Never         OPHTHALMIC EXAM:  Base Eye Exam    Visual Acuity (Snellen - Linear)      Right Left   Dist [place] 20/250 -2 20/300 -   Dist ph [place] NI NI  VA peripherally OD       Tonometry (Tonopen, 8:38 AM)      Right Left   Pressure 15 13       Pupils      Dark Light Shape React APD   Right 4 3 Round Minimal None   Left 4 3 Round Minimal None       Visual Fields (Counting fingers)      Left Right    Full    Restrictions  Partial inner superior temporal, inferior temporal, superior nasal deficiencies       Extraocular Movement      Right Left    Full, Ortho Full, Ortho       Neuro/Psych    Oriented x3: Yes   Mood/Affect: Normal       Dilation    Both eyes: 1.0% Mydriacyl, 2.5% Phenylephrine @ 8:38 AM        Slit Lamp and Fundus Exam    Slit Lamp Exam      Right Left   Lids/Lashes Dermatochalasis - upper lid, Meibomian gland dysfunction Dermatochalasis - upper lid, Meibomian gland dysfunction   Conjunctiva/Sclera White and quiet White and quiet   Cornea 2-3+ fine Punctate epithelial erosions 1-2+ Punctate epithelial erosions   Anterior Chamber deep, clear, narrow angles deep, clear, narrow angles   Iris Round and dilated, No NVI Round and dilated, No NVI   Lens 1-2+ Nuclear sclerosis, 2+ Cortical cataract 1-2+ Nuclear sclerosis, 2+ Cortical cataract   Vitreous blood stained vitreous condensations severe fibrosis, vitreous sheet temporal periphery with subhyaloid heme, +blood stained vitreous condensations       Fundus Exam      Right Left   Disc mild Pallor, +fibrosis, regressed NVD +NVD obscured by fibrosis   Macula extensive fibrosis with TRD NVE obscuring view, +SRF/TRD   Vessels attenuated, Tortuous, +NV 360 -- regressed attenuated, Tortuous, +NVE 360 - regressed   Periphery Attached, scattered DBH, +fibrosis related to NVE, TRD along proximal  ST arcades extending nasal to disc, 360 PRP Extensive fibrosis, +TRD, boat shaped subhyaloid heme inferiorly        Refraction    Manifest Refraction      Sphere Cylinder Dist VA   Right +1.25 Sphere 20/250-2   Left             IMAGING  AND PROCEDURES  Imaging and Procedures for 08/14/2020  OCT, Retina - OU - Both Eyes       Right Eye Quality was good. Progression has worsened. Findings include abnormal foveal contour, intraretinal fluid, preretinal fibrosis, epiretinal membrane, macular pucker, vitreous traction, subretinal fluid (Severe tractional fibrosis and edema, interval development of tractional detachment centrally).   Left Eye Quality was good. Central Foveal Thickness: 1242. Progression has been stable. Findings include abnormal foveal contour, vitreous traction, subretinal fluid, macular pucker, epiretinal membrane, intraretinal fluid (Severe tractional fibrosis and edema, +TRD).   Notes *Images captured and stored on drive  Diagnosis / Impression:  OD: Severe tractional fibrosis and edema, interval development of tractional detachment centrally OS: Severe tractional fibrosis and edema, +TRD  Clinical management:  See below  Abbreviations: NFP - Normal foveal profile. CME - cystoid macular edema. PED - pigment epithelial detachment. IRF - intraretinal fluid. SRF - subretinal fluid. EZ - ellipsoid zone. ERM - epiretinal membrane. ORA - outer retinal atrophy. ORT - outer retinal tubulation. SRHM - subretinal hyper-reflective material. IRHM - intraretinal hyper-reflective material        Intravitreal Injection, Pharmacologic Agent - OD - Right Eye       Time Out 08/14/2020. 9:08 AM. Confirmed correct patient, procedure, site, and patient consented.   Anesthesia Topical anesthesia was used. Anesthetic medications included Lidocaine 2%, Proparacaine 0.5%.   Procedure Preparation included 5% betadine to ocular surface, eyelid speculum. A supplied needle was used.    Injection:  1.25 mg Bevacizumab (AVASTIN) 1.25mg /0.54mL SOLN   NDC: [id number], Lot: [id number] , Expiration date: 10/01/2020   Route: Intravitreal, Site: Right Eye, Waste: 0 mL  Post-op Post injection exam found visual acuity of at least counting fingers. The patient tolerated the procedure well. There were no complications. The patient received written and verbal post procedure care education. Post injection medications were not given.        Intravitreal Injection, Pharmacologic Agent - OS - Left Eye       Time Out 08/14/2020. 9:08 AM. Confirmed correct patient, procedure, site, and patient consented.   Anesthesia Topical anesthesia was used. Anesthetic medications included Lidocaine 2%, Proparacaine 0.5%.   Procedure Preparation included 5% betadine to ocular surface, eyelid speculum. A supplied (32g) needle was used.   Injection:  2 mg aflibercept 10/14/2020) SOLN   NDC: Gretta Cool, Lot: [id number], Expiration date: 01/05/2021   Route: Intravitreal, Site: Left Eye, Waste: 0.05 mL  Post-op Post injection exam found visual acuity of at least counting fingers. The patient tolerated the procedure well. There were no complications. The patient received written and verbal post procedure care education. Post injection medications were not given.   Notes **SAMPLE MEDICATION ADMINISTERED**                ASSESSMENT/PLAN:    ICD-10-CM   1. Proliferative diabetic retinopathy of both eyes with macular edema associated with type 2 diabetes mellitus (HCC)  01/07/2021 Intravitreal Injection, Pharmacologic Agent - OD - Right Eye    Intravitreal Injection, Pharmacologic Agent - OS - Left Eye    aflibercept (EYLEA) SOLN 2 mg    Bevacizumab (AVASTIN) SOLN 1.25 mg  2. Retinal edema  H35.81 OCT, Retina - OU - Both Eyes  3. 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 Essential hypertension  I10   5. Hypertensive retinopathy of both eyes  H35.033   6. Combined forms of  age-related cataract of both eyes  H25.813     1-3. Severe proliferative diabetic retinopathy  with edema OU  - FA (02.04.22) shows florid NV with leakage and pooling OU (OS > OD)   - OD: severe tractional fibrosis and edema, now w/ TRD  - OS:  With TRD and subhyaloid heme -- slightly improved             - s/p IVA OD #1 (2.8.22)  - s/p IVE OS #1 (02.04.22) -- sample  - s/p PRP OD (02.04.22)             - s/p PRP OS (2.8.22)  - exam shows regression of NV but consolidation of fibrosis and TRD OU - OCT shows severe tractional fibrosis and edema OU, now TRD OU - discussed findings and severity of disease, guarded prognosis and extensive treatments and follow ups need to stabilize eyes and improve vision -- will likely need surgery OU - recommend IVA OD #2 and IVE OS #2 (sample) today 03.08.22  - recommend TRD repair OS -- PPV w/ membrane peel, endolaser and possible gas vs silicon oil OS under general anesthesia on Thursday, 3.17.22 - pt wishes to proceed with procedures and surgery - RBA of procedure discussed, questions answered - informed consents obtained and signed - see procedure note - will need pre-op medical clearance from PCP and Anesthesia, possible Cardiology - will need pre-op COVID testing - f/u March 14, pre-op DFE, OCT  4,5. Hypertensive retinopathy OU - discussed importance of tight BP control - monitor  6. Mixed Cataract OU - The symptoms of cataract, surgical options, and treatments and risks were discussed with patient. - discussed diagnosis and progression - not yet visually significant - monitor for now    Ophthalmic Meds Ordered this visit:  Meds ordered this encounter  Medications  . aflibercept (EYLEA) SOLN 2 mg  . Bevacizumab (AVASTIN) SOLN 1.25 mg       Return in about 6 days (around 08/20/2020) for pre-op check, Dilated Exam, OCT.  There are no Patient Instructions on file for this visit.   Explained the diagnoses, plan, and follow up with the  patient and they expressed understanding.  Patient expressed understanding of the importance of proper follow up care.   This document serves as a record of services personally performed by Karie Chimera, MD, PhD. It was created on their behalf by Cristopher Estimable, COT an ophthalmic technician. The creation of this record is the provider's dictation and/or activities during the visit.    Electronically signed by: Cristopher Estimable, COT 2.28.22 @ 8:49 PM   This document serves as a record of services personally performed by Karie Chimera, MD, PhD. It was created on their behalf by Glee Arvin. Manson Passey, OA an ophthalmic technician. The creation of this record is the provider's dictation and/or activities during the visit.    Electronically signed by: Glee Arvin. Kristopher Oppenheim 03.08.2022 8:49 PM  Karie Chimera, M.D., Ph.D. Diseases & Surgery of the Retina and Vitreous [facility] 08/14/2020   I have reviewed the above documentation for accuracy and completeness, and I agree with the above. Karie Chimera, M.D., Ph.D. 08/14/20 8:49 PM   Abbreviations: M myopia (nearsighted); A astigmatism; H hyperopia (farsighted); P presbyopia; Mrx spectacle prescription;  CTL contact lenses; OD right eye; OS left eye; OU both eyes  XT exotropia; ET esotropia; PEK punctate epithelial keratitis; PEE punctate epithelial erosions; DES dry eye syndrome; MGD meibomian gland dysfunction; ATs artificial tears; PFAT's preservative free artificial tears; NSC nuclear sclerotic cataract; PSC posterior subcapsular cataract;  ERM epi-retinal membrane; PVD posterior vitreous detachment; RD retinal detachment; DM diabetes mellitus; DR diabetic retinopathy; NPDR non-proliferative diabetic retinopathy; PDR proliferative diabetic retinopathy; CSME clinically significant macular edema; DME diabetic macular edema; dbh dot blot hemorrhages; CWS cotton wool spot; POAG primary open angle glaucoma; C/D cup-to-disc ratio; HVF  humphrey visual field; GVF goldmann visual field; OCT optical coherence tomography; IOP intraocular pressure; BRVO Branch retinal vein occlusion; CRVO central retinal vein occlusion; CRAO central retinal artery occlusion; BRAO branch retinal artery occlusion; RT retinal tear; SB scleral buckle; PPV pars plana vitrectomy; VH Vitreous hemorrhage; PRP panretinal laser photocoagulation; IVK intravitreal kenalog; VMT vitreomacular traction; MH Macular hole;  NVD neovascularization of the disc; NVE neovascularization elsewhere; AREDS age related eye disease study; ARMD age related macular degeneration; POAG primary open angle glaucoma; EBMD epithelial/anterior basement membrane dystrophy; ACIOL anterior chamber intraocular lens; IOL intraocular lens; PCIOL posterior chamber intraocular lens; Phaco/IOL phacoemulsification with intraocular lens placement; PRK photorefractive keratectomy; LASIK laser assisted in situ keratomileusis; HTN hypertension; DM diabetes mellitus; COPD chronic obstructive pulmonary disease

## 2020-08-08 ENCOUNTER — Other Ambulatory Visit (HOSPITAL_COMMUNITY): Payer: 59

## 2020-08-10 NOTE — Progress Notes (Incomplete)
Advanced Heart Failure Clinic Note   PCP: Dema Severin, NP  Cardiologist: Dr. Eldridge Dace HF: Dr. Gala Romney   HPI: Mrs Keeler is a 50 year old woman with DM2, tobacco abuse,CAD and systolic HF diagnosed in 5/21 with EF 20%  Admitted 5/21 with acute HF. Echo EF 20% with severe RV dysfunction. R/LHC showed diffuse non-obstructive CAD with chronic dissection/recannalization of mid LAD. Suspect she had an out of hospital MI at some point.  There were no interventional targets.  HF follow up (12/21) we were unable to further titrate meds due to soft BP.    Today she returns for HF follow up. Last seen (1/21) and fluid was up, lasix increased for 1 day. Now, overall feeling fine. Denies increasing SOB, CP, dizziness, edema, or PND/Orthopnea. Appetite ok. No fever or chills. Weight at home 170 pounds. Taking all medications. She is still smoking 1/2- 1 pppd.   Cardiac Studies: cMRI (8/21): LVEF 30%, RVEF 66%, Subendocardial LGE c/w prior LAD and RCA infarcts. >50% transmurality of LGE suggesting nonviability in basal to mid inferior walls, apical anterior/septal walls, and apex. <50% transmurality of LGE suggesting viability in mid anterior wall.   Echo (5/21): EF 20% with severe RV dysfunction.  RHC (5/21): Ao = 106/75 (87) LV = 107/27 RA = 15 RV = 54/16 PA = 57/25 (40) PCW = 27 (v = 37) Fick cardiac output/index = 4.4/2.0 PVR = 1.4 WU SVR = 1297 Ao sat = 99% PA sat = 62%, 63%  ROS: All systems reviewed and negative except as per HPI.   Past Medical History:  Diagnosis Date  . CHF (congestive heart failure) (HCC)   . Diabetes mellitus without complication (HCC)   . Tobacco abuse     Current Outpatient Medications  Medication Sig Dispense Refill  . aspirin EC 81 MG tablet Take 1 tablet (81 mg total) by mouth daily. 150 tablet 2  . atorvastatin (LIPITOR) 80 MG tablet Take 1 tablet (80 mg total) by mouth daily. 30 tablet 11  . carvedilol (COREG) 3.125 MG tablet Take 1 tablet  (3.125 mg total) by mouth 2 (two) times daily. 60 tablet 11  . D3-50 1.25 MG (50000 UT) capsule Take by mouth.    . digoxin (LANOXIN) 0.125 MG tablet Take 1 tablet (125 mcg total) by mouth daily. 90 tablet 3  . Dulaglutide (TRULICITY) 1.5 MG/0.5ML SOPN Inject into the skin once a week.    . empagliflozin (JARDIANCE) 25 MG TABS tablet Take 1 tablet (25 mg total) by mouth daily. 30 tablet 11  . furosemide (LASIX) 20 MG tablet Take 20 mg by mouth daily.    . metFORMIN (GLUCOPHAGE) 1000 MG tablet Take 1 tablet by mouth in the morning and at bedtime.    . potassium chloride (KLOR-CON) 10 MEQ tablet Take 4 tablets (40 mEq total) by mouth daily. 120 tablet 11  . sacubitril-valsartan (ENTRESTO) 49-51 MG Take 1 tablet by mouth 2 (two) times daily. 60 tablet 6  . spironolactone (ALDACTONE) 25 MG tablet Take 1 tablet (25 mg total) by mouth daily. 90 tablet 3  . vitamin B-12 (CYANOCOBALAMIN) 1000 MCG tablet Take 1,000 mcg by mouth daily. Taking once a day     No current facility-administered medications for this visit.   No Known Allergies  Social History   Socioeconomic History  . Marital status: Married    Spouse name: Not on file  . Number of children: Not on file  . Years of education: Not on file  .  Highest education level: Not on file  Occupational History  . Not on file  Tobacco Use  . Smoking status: Current Every Day Smoker  . Smokeless tobacco: Never Used  Vaping Use  . Vaping Use: Never used  Substance and Sexual Activity  . Alcohol use: Not Currently  . Drug use: Never  . Sexual activity: Not on file  Other Topics Concern  . Not on file  Social History Narrative  . Not on file   Social Determinants of Health   Financial Resource Strain: Not on file  Food Insecurity: Not on file  Transportation Needs: Not on file  Physical Activity: Not on file  Stress: Not on file  Social Connections: Not on file  Intimate Partner Violence: Not on file   Family History  Problem  Relation Age of Onset  . Sudden Cardiac Death Mother   . CAD Father   . CAD Paternal Grandmother   . CAD Paternal Grandfather    There were no vitals filed for this visit.  PHYSICAL EXAM: General:  NAD. No resp difficulty HEENT: Normal Neck: Supple. No JVD. Carotids 2+ bilat; no bruits. No lymphadenopathy or thryomegaly appreciated. Cor: PMI nondisplaced. Regular rate & rhythm. No rubs, gallops or murmurs. Lungs: Clear Abdomen: Soft, nontender, nondistended. No hepatosplenomegaly. No bruits or masses. Good bowel sounds. Extremities: No cyanosis, clubbing, rash, edema Neuro: alert & oriented x 3, cranial nerves grossly intact. Moves all 4 extremities w/o difficulty. Affect pleasant.  ECG:    ASSESSMENT & PLAN:  1. 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 R/LHC showed diffuse non-obstructive CAD with chronic dissection/recannalization of mid LAD. Suspect she had an out of hospital MI at some point.  There were no interventional targets. - cMRI 8/21: LVEF 30% priro LAD & RCA infarcts. RV ok. - NYHA II. Volume status ok. BP soft, 118/70 today and denies orthostatic symptoms.  - Will stop daily lasix and KCl - Continue Entresto 49-51 mg bid (she will monitor BP closely at home and will notify us if she develops any orthostatic symptoms)  - Continue Spiro 25 mg daily. - Continue Coreg 3.125 mg bid. - Continue digoxin 0.125 mg daily.  - Continue Jardiance 25 mg daily.  - Check BMET and dig level today. - Will plan repeat echo once on maximally tolerated meds. Refer to EP for ICD if EF remains < 35%.   2. CAD - LHC 5/21 showed diffuse non-obstructive CAD with chronic dissection/recannalization of mid LAD. Suspect she had an out of hospital MI at some point.  There were no interventional targets. - cMRI as above c/w prior infarcts in LAD/RCA territories - No CP. - Continue ASA/statin and  blocker. - Complete smoking cessation strongly  advised.   3. Type 2 DM  - Management per PCP - Continue Metformin + Trulicity. - Continue Jardiance. No GU symptoms.  4. Tobacco Abuse  - Still smoking 1/2-2ppm - Discussed importance of smoking cessation  5. HLD  - LDL goal <70 (98 on recent FLP 6/21).  - Continue atorvastatin 80 qhs. - LDL 44 (11/21)  F/u in 4 weeks for attempt at further med titration   Jacklynn Ganong, FNP-BC 08/10/20

## 2020-08-13 ENCOUNTER — Encounter (HOSPITAL_COMMUNITY): Payer: 59

## 2020-08-14 ENCOUNTER — Ambulatory Visit (INDEPENDENT_AMBULATORY_CARE_PROVIDER_SITE_OTHER): Payer: 59 | Admitting: Ophthalmology

## 2020-08-14 ENCOUNTER — Other Ambulatory Visit: Payer: Self-pay

## 2020-08-14 ENCOUNTER — Encounter (INDEPENDENT_AMBULATORY_CARE_PROVIDER_SITE_OTHER): Payer: Self-pay | Admitting: Ophthalmology

## 2020-08-14 DIAGNOSIS — H25813 Combined forms of age-related cataract, bilateral: Secondary | ICD-10-CM

## 2020-08-14 DIAGNOSIS — H3581 Retinal edema: Secondary | ICD-10-CM | POA: Diagnosis not present

## 2020-08-14 DIAGNOSIS — E113513 Type 2 diabetes mellitus with proliferative diabetic retinopathy with macular edema, bilateral: Secondary | ICD-10-CM | POA: Diagnosis not present

## 2020-08-14 DIAGNOSIS — I10 Essential (primary) hypertension: Secondary | ICD-10-CM

## 2020-08-14 DIAGNOSIS — H3342 Traction detachment of retina, left eye: Secondary | ICD-10-CM | POA: Diagnosis not present

## 2020-08-14 DIAGNOSIS — H35033 Hypertensive retinopathy, bilateral: Secondary | ICD-10-CM

## 2020-08-14 MED ORDER — AFLIBERCEPT 2MG/0.05ML IZ SOLN FOR KALEIDOSCOPE
2.0000 mg | INTRAVITREAL | Status: AC | PRN
  Administered 2020-08-14: 2 mg via INTRAVITREAL

## 2020-08-14 MED ORDER — BEVACIZUMAB CHEMO INJECTION 1.25MG/0.05ML SYRINGE FOR KALEIDOSCOPE
1.2500 mg | INTRAVITREAL | Status: AC | PRN
  Administered 2020-08-14: 1.25 mg via INTRAVITREAL

## 2020-08-16 NOTE — Progress Notes (Signed)
[facility] - Clinic Note  08/20/2020     CHIEF COMPLAINT Patient presents for Retina Follow Up   HISTORY OF PRESENT ILLNESS: Shelly [name] is a 50 y.o. female who presents to the clinic today for:   HPI    Retina Follow Up    Patient presents with  Diabetic Retinopathy.  In both eyes.  This started 1 week ago.  I, the attending physician,  performed the HPI with the patient and updated documentation appropriately.          Comments    Patient here for 1 week retina follow up for pre op for sx 08-23-20.  Patient states vision still the same. No eye pain.        Last edited by [name], MD on 08/20/2020  9:26 AM. (History)    pt feels like her vision is stable, she thinks her peripheral vision may be better  Referring physician: Dema Severin, NP [address] [place],  [place] [address]  HISTORICAL INFORMATION:   Selected notes from the MEDICAL RECORD NUMBER Referred by Dr. Aletta Edouard for concern of decreased VA OU   CURRENT MEDICATIONS: No current outpatient medications on file. (Ophthalmic Drugs)   No current facility-administered medications for this visit. (Ophthalmic Drugs)   Current Outpatient Medications (Other)  Medication Sig  . aspirin EC 81 MG tablet Take 1 tablet (81 mg total) by mouth daily.  Marland Kitchen atorvastatin (LIPITOR) 80 MG tablet Take 1 tablet (80 mg total) by mouth daily.  . carvedilol (COREG) 3.125 MG tablet Take 1 tablet (3.125 mg total) by mouth 2 (two) times daily.  . D3-50 1.25 MG (50000 UT) capsule Take by mouth. (Patient not taking: Reported on 08/15/2020)  . digoxin (LANOXIN) 0.125 MG tablet Take 1 tablet (125 mcg total) by mouth daily.  . Dulaglutide (TRULICITY) 1.5 MG/0.5ML SOPN Inject 1.5 mg into the skin once a week.  . empagliflozin (JARDIANCE) 25 MG TABS tablet Take 1 tablet (25 mg total) by mouth daily.  . furosemide (LASIX) 20 MG tablet Take 20 mg by mouth daily.  . hydrOXYzine (ATARAX/VISTARIL) 25 MG tablet Take 25 mg by  mouth 2 (two) times daily as needed for anxiety (sleep).  . metFORMIN (GLUCOPHAGE) 1000 MG tablet Take 1,000 mg by mouth in the morning and at bedtime.  . potassium chloride (KLOR-CON) 10 MEQ tablet Take 4 tablets (40 mEq total) by mouth daily. (Patient taking differently: Take 20 mEq by mouth daily.)  . sacubitril-valsartan (ENTRESTO) 49-51 MG Take 1 tablet by mouth 2 (two) times daily.  Marland Kitchen spironolactone (ALDACTONE) 25 MG tablet Take 1 tablet (25 mg total) by mouth daily.  . vitamin B-12 (CYANOCOBALAMIN) 1000 MCG tablet Take 1,000 mcg by mouth daily.   No current facility-administered medications for this visit. (Other)      REVIEW OF SYSTEMS: ROS    Positive for: Endocrine, Cardiovascular, Eyes   Negative for: Constitutional, Gastrointestinal, Neurological, Skin, Genitourinary, Musculoskeletal, HENT, Respiratory, Psychiatric, Allergic/Imm, Heme/Lymph   Last edited by Laddie Aquas, COA on 08/20/2020  8:49 AM. (History)       ALLERGIES No Known Allergies  PAST MEDICAL HISTORY Past Medical History:  Diagnosis Date  . Anemia   . Anxiety   . Arthritis   . Cataract    Mixed OU  . CHF (congestive heart failure) (HCC)   . Coronary artery disease   . Diabetes mellitus without complication (HCC)   . Diabetic retinopathy (HCC)    PDR  OU  . Headache    Cluster headaches in the past  . History of kidney stones    "2 sitting"  . Hypertension   . Hypertensive retinopathy    OU  . Myocardial infarction (HCC)   . Pneumonia   . Tobacco abuse    Past Surgical History:  Procedure Laterality Date  . RIGHT/LEFT HEART CATH AND CORONARY ANGIOGRAPHY N/A 10/14/2019   Procedure: RIGHT/LEFT HEART CATH AND CORONARY ANGIOGRAPHY;  Surgeon: Dolores Patty, MD;  Location: [facility];  Service: Cardiovascular;  Laterality: N/A;    FAMILY HISTORY Family History  Problem Relation Age of Onset  . Sudden Cardiac Death Mother   . CAD Father   . CAD Paternal Grandmother   . CAD  Paternal Grandfather     SOCIAL HISTORY Social History   Tobacco Use  . Smoking status: Current Every Day Smoker    Packs/day: 1.50  . Smokeless tobacco: Never Used  Vaping Use  . Vaping Use: Never used  Substance Use Topics  . Alcohol use: Not Currently  . Drug use: Never         OPHTHALMIC EXAM:  Base Eye Exam    Visual Acuity (Snellen - Linear)      Right Left   Dist [place] 20/400 +1 20/400 -1   Dist ph [place] NI NI  OD looking to the side to see letters.       Tonometry (Tonopen, 8:45 AM)      Right Left   Pressure 21 17       Pupils      Dark Light Shape React APD   Right 4 3 Round Minimal None   Left 4 3 Round Minimal None       Visual Fields (Counting fingers)      Left Right    Full    Restrictions  Partial inner superior temporal, inferior temporal, superior nasal deficiencies       Extraocular Movement      Right Left    Full Full       Neuro/Psych    Oriented x3: Yes   Mood/Affect: Normal       Dilation    Both eyes: 1.0% Mydriacyl, 2.5% Phenylephrine @ 8:45 AM        Slit Lamp and Fundus Exam    Slit Lamp Exam      Right Left   Lids/Lashes Dermatochalasis - upper lid, Meibomian gland dysfunction Dermatochalasis - upper lid, Meibomian gland dysfunction   Conjunctiva/Sclera White and quiet White and quiet   Cornea 2-3+ fine Punctate epithelial erosions 1-2+ Punctate epithelial erosions   Anterior Chamber deep, clear, narrow angles deep, clear, narrow angles   Iris Round and dilated, No NVI Round and dilated, No NVI   Lens 1-2+ Nuclear sclerosis, 2+ Cortical cataract 1-2+ Nuclear sclerosis, 2+ Cortical cataract   Vitreous blood stained vitreous condensations severe fibrosis, vitreous sheet temporal periphery with subhyaloid heme -- improving, +blood stained vitreous condensations       Fundus Exam      Right Left   Disc mild Pallor, massive fibrosis along superior disc, regressed NVD +NVD obscured by fibrosis   Macula extensive fibrosis  with TRD NVE obscuring view, +SRF/TRD   Vessels attenuated, Tortuous, +NV 360 -- regressed attenuated, Tortuous, +NVE 360 - regressed   Periphery Attached, scattered DBH, +fibrosis related to NVE, TRD along proximal ST arcades extending nasal to disc, 360 PRP Extensive fibrosis, +TRD, boat shaped subhyaloid heme inferiorly  IMAGING AND PROCEDURES  Imaging and Procedures for 08/20/2020  Glucose, capillary     Component Value Flag Ref Range Units Status   Glucose-Capillary 157      70 - 99 mg/dL Final   Comment:   Glucose reference range applies only to samples taken after fasting for at least 8 hours.        OCT, Retina - OU - Both Eyes       Right Eye Quality was good. Central Foveal Thickness: 594. Progression has worsened. Findings include abnormal foveal contour, intraretinal fluid, preretinal fibrosis, epiretinal membrane, macular pucker, vitreous traction, subretinal fluid (Severe tractional fibrosis and edema, interval progression of fibrosis and tractional detachment centrally).   Left Eye Quality was good. Progression has been stable. Findings include abnormal foveal contour, vitreous traction, subretinal fluid, macular pucker, epiretinal membrane, intraretinal fluid (Severe tractional fibrosis and edema, +TRD).   Notes *Images captured and stored on drive  Diagnosis / Impression:  OD: Severe tractional fibrosis and edema, interval progression of fibrosis and tractional detachment centrally OS: Severe tractional fibrosis and edema, +TRD  Clinical management:  See below  Abbreviations: NFP - Normal foveal profile. CME - cystoid macular edema. PED - pigment epithelial detachment. IRF - intraretinal fluid. SRF - subretinal fluid. EZ - ellipsoid zone. ERM - epiretinal membrane. ORA - outer retinal atrophy. ORT - outer retinal tubulation. SRHM - subretinal hyper-reflective material. IRHM - intraretinal hyper-reflective material        Color Fundus Photography  Optos - OU - Both Eyes       Right Eye Progression has no prior data. Disc findings include (+fibrosis). Macula : hemorrhage, detached (+fibrosis). Vessels : attenuated, tortuous vessels, Neovascularization. Periphery : RPE abnormality, hemorrhage.   Left Eye Progression has no prior data. Disc findings include (+fibrosis). Macula : hemorrhage, detached (Obscured by heme). Vessels : tortuous vessels, attenuated, Neovascularization. Periphery : RPE abnormality, hemorrhage (Good PRP laser nasal hemisphere, temporal hemisphere obscured by [facility]).   Notes **Images stored on drive**  Impression: PDR OU with extensive fibrosis and TRD OU                  ASSESSMENT/PLAN:    ICD-10-CM   1. Proliferative diabetic retinopathy of both eyes with macular edema associated with type 2 diabetes mellitus (HCC)  S28.3151 Color Fundus Photography Optos - OU - Both Eyes  2. Retinal edema  H35.81 OCT, Retina - OU - Both Eyes  3. 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 Essential hypertension  I10   5. Hypertensive retinopathy of both eyes  H35.033   6. Combined forms of age-related cataract of both eyes  H25.813     1-3. Severe proliferative diabetic retinopathy with edema OU  - FA (02.04.22) shows florid NV with leakage and pooling OU (OS > OD)   - OD: severe tractional fibrosis and edema, now w/ TRD  - OS:  With TRD and subhyaloid heme -- slightly improved             - s/p IVA OD #1 (02.08.22), #2 (03.08.22)  - s/p IVE OS #1 (02.04.22) -- sample, #2 (03.08.22) -- sample  - s/p PRP OD (02.04.22)             - s/p PRP OS (2.8.22)  - exam shows regression of NV but consolidation of fibrosis and TRD OU - OCT shows severe tractional fibrosis and edema OU, now TRD OU - discussed findings and severity of disease, guarded prognosis and extensive  treatments and follow ups need to stabilize eyes and improve vision -- will likely need surgery OU  - recommend TRD repair OS first -- PPV w/  membrane peel, endolaser and possible gas vs silicon oil OS under general anesthesia on Thursday, 3.17.22 - surgery scheduled for August 23, 2020 at 11:30am, [facility] OR 8 - pre-admit appt scheduled for today, 03.14.22, at 1:00pm - f/u Friday, March 18 -- POV  4,5. Hypertensive retinopathy OU - discussed importance of tight BP control - monitor  6. Mixed Cataract OU - The symptoms of cataract, surgical options, and treatments and risks were discussed with patient. - discussed diagnosis and progression - not yet visually significant - monitor for now    Ophthalmic Meds Ordered this visit:  No orders of the defined types were placed in this encounter.      Return in about 4 days (around 08/24/2020) for f/u PDR OS, POV.  There are no Patient Instructions on file for this visit.  This document serves as a record of services personally performed by Karie Chimera, MD, PhD. It was created on their behalf by Herby Abraham, COA, an ophthalmic technician. The creation of this record is the provider's dictation and/or activities during the visit.    Electronically signed by: Herby Abraham, COA 03.10.2022 1:34 PM   This document serves as a record of services personally performed by Karie Chimera, MD, PhD. It was created on their behalf by Glee Arvin. Manson Passey, OA an ophthalmic technician. The creation of this record is the provider's dictation and/or activities during the visit.    Electronically signed by: Glee Arvin. Manson Passey, [place] 03.14.2022 1:34 PM  Karie Chimera, M.D., Ph.D. Diseases & Surgery of the Retina and Vitreous [facility] 08/20/2020   I have reviewed the above documentation for accuracy and completeness, and I agree with the above. Karie Chimera, M.D., Ph.D. 08/20/20 1:34 PM   Abbreviations: M myopia (nearsighted); A astigmatism; H hyperopia (farsighted); P presbyopia; Mrx spectacle prescription;  CTL contact lenses; OD right eye; OS left eye; OU both eyes   XT exotropia; ET esotropia; PEK punctate epithelial keratitis; PEE punctate epithelial erosions; DES dry eye syndrome; MGD meibomian gland dysfunction; ATs artificial tears; PFAT's preservative free artificial tears; NSC nuclear sclerotic cataract; PSC posterior subcapsular cataract; ERM epi-retinal membrane; PVD posterior vitreous detachment; RD retinal detachment; DM diabetes mellitus; DR diabetic retinopathy; NPDR non-proliferative diabetic retinopathy; PDR proliferative diabetic retinopathy; CSME clinically significant macular edema; DME diabetic macular edema; dbh dot blot hemorrhages; CWS cotton wool spot; POAG primary open angle glaucoma; C/D cup-to-disc ratio; HVF humphrey visual field; GVF goldmann visual field; OCT optical coherence tomography; IOP intraocular pressure; BRVO Branch retinal vein occlusion; CRVO central retinal vein occlusion; CRAO central retinal artery occlusion; BRAO branch retinal artery occlusion; RT retinal tear; SB scleral buckle; PPV pars plana vitrectomy; VH Vitreous hemorrhage; PRP panretinal laser photocoagulation; IVK intravitreal kenalog; VMT vitreomacular traction; MH Macular hole;  NVD neovascularization of the disc; NVE neovascularization elsewhere; AREDS age related eye disease study; ARMD age related macular degeneration; POAG primary open angle glaucoma; EBMD epithelial/anterior basement membrane dystrophy; ACIOL anterior chamber intraocular lens; IOL intraocular lens; PCIOL posterior chamber intraocular lens; Phaco/IOL phacoemulsification with intraocular lens placement; PRK photorefractive keratectomy; LASIK laser assisted in situ keratomileusis; HTN hypertension; DM diabetes mellitus; COPD chronic obstructive pulmonary disease

## 2020-08-19 NOTE — H&P (Signed)
Shelly [name] is an 50 y.o. female.    Chief Complaint: proliferative diabetic retinopathy with tractional retinal detachment, left eye  HPI: Pt with 1+ year history of decreased vision OU. On dilated exam was found to have extensive proliferative diabetic retinopathy with florid neovascularization OU and tractional retinal detachment OS. After a discussion of the risks benefits and alternatives, the patient has elected to proceed with surgery to treat the proliferative diabetic retinopathy and repair the tractional retinal detachment OS -- 25g PPV w/ membrane peel, endolaser, and silicon oil + intravitreal avastin, under general anesthesia.  Past Medical History:  Diagnosis Date  . Cataract    Mixed OU  . CHF (congestive heart failure) (HCC)   . Diabetes mellitus without complication (HCC)   . Diabetic retinopathy (HCC)    PDR OU  . Hypertensive retinopathy    OU  . Tobacco abuse     Past Surgical History:  Procedure Laterality Date  . RIGHT/LEFT HEART CATH AND CORONARY ANGIOGRAPHY N/A 10/14/2019   Procedure: RIGHT/LEFT HEART CATH AND CORONARY ANGIOGRAPHY;  Surgeon: Dolores Patty, MD;  Location: [facility] INVASIVE CV LAB;  Service: Cardiovascular;  Laterality: N/A;    Family History  Problem Relation Age of Onset  . Sudden Cardiac Death Mother   . CAD Father   . CAD Paternal Grandmother   . CAD Paternal Grandfather    Social History:  reports that she has been smoking. She has never used smokeless tobacco. She reports previous alcohol use. She reports that she does not use drugs.  Allergies: No Known Allergies  No medications prior to admission.    Review of systems otherwise negative  There were no vitals taken for this visit.  Physical exam: Mental status: oriented x3. Eyes: See eye exam associated with this date of surgery Ears, Nose, Throat: within normal limits Neck: Within Normal limits General: within normal limits Chest: Within normal limits Breast:  deferred Heart: Within normal limits Abdomen: Within normal limits GU: deferred Extremities: within normal limits Skin: within normal limits  Assessment/Plan 1. Proliferative diabetic retinopathy with tractional retinal detachment, left eye  Plan: To [facility] for 25g PPV w/ membrane peel, endolaser, silicon oil and intravitreal Avastin OS under general anesthesia - case scheduled for Thursday, 03.17.22, 1130 am -- [facility] OR 08  Karie Chimera, M.D., Ph.D. Vitreoretinal Surgeon [facility]

## 2020-08-20 ENCOUNTER — Encounter (INDEPENDENT_AMBULATORY_CARE_PROVIDER_SITE_OTHER): Payer: Self-pay | Admitting: Ophthalmology

## 2020-08-20 ENCOUNTER — Encounter (HOSPITAL_COMMUNITY): Payer: Self-pay

## 2020-08-20 ENCOUNTER — Other Ambulatory Visit: Payer: Self-pay

## 2020-08-20 ENCOUNTER — Other Ambulatory Visit (HOSPITAL_COMMUNITY)
Admission: RE | Admit: 2020-08-20 | Discharge: 2020-08-20 | Disposition: A | Discharge status: Home / Self Care | Payer: 59 | Source: Ambulatory Visit | Attending: Ophthalmology | Admitting: Ophthalmology

## 2020-08-20 ENCOUNTER — Encounter (HOSPITAL_COMMUNITY)
Admission: RE | Admit: 2020-08-20 | Discharge: 2020-08-20 | Disposition: A | Discharge status: Home / Self Care | Payer: 59 | Source: Ambulatory Visit | Attending: Ophthalmology | Admitting: Ophthalmology

## 2020-08-20 ENCOUNTER — Ambulatory Visit (INDEPENDENT_AMBULATORY_CARE_PROVIDER_SITE_OTHER): Payer: 59 | Admitting: Ophthalmology

## 2020-08-20 DIAGNOSIS — H3581 Retinal edema: Secondary | ICD-10-CM

## 2020-08-20 DIAGNOSIS — H3342 Traction detachment of retina, left eye: Secondary | ICD-10-CM | POA: Diagnosis not present

## 2020-08-20 DIAGNOSIS — Z20822 Contact with and (suspected) exposure to covid-19: Secondary | ICD-10-CM | POA: Insufficient documentation

## 2020-08-20 DIAGNOSIS — E113513 Type 2 diabetes mellitus with proliferative diabetic retinopathy with macular edema, bilateral: Secondary | ICD-10-CM

## 2020-08-20 DIAGNOSIS — Z01812 Encounter for preprocedural laboratory examination: Secondary | ICD-10-CM | POA: Insufficient documentation

## 2020-08-20 DIAGNOSIS — I10 Essential (primary) hypertension: Secondary | ICD-10-CM

## 2020-08-20 DIAGNOSIS — H35033 Hypertensive retinopathy, bilateral: Secondary | ICD-10-CM

## 2020-08-20 DIAGNOSIS — H25813 Combined forms of age-related cataract, bilateral: Secondary | ICD-10-CM

## 2020-08-20 HISTORY — DX: Unspecified osteoarthritis, unspecified site: M19.90

## 2020-08-20 HISTORY — DX: Essential (primary) hypertension: I10

## 2020-08-20 HISTORY — DX: Acute myocardial infarction, unspecified: I21.9

## 2020-08-20 HISTORY — DX: Personal history of urinary calculi: Z87.442

## 2020-08-20 HISTORY — DX: Atherosclerotic heart disease of native coronary artery without angina pectoris: I25.10

## 2020-08-20 HISTORY — DX: Pneumonia, unspecified organism: J18.9

## 2020-08-20 HISTORY — DX: Headache, unspecified: R51.9

## 2020-08-20 HISTORY — DX: Anemia, unspecified: D64.9

## 2020-08-20 HISTORY — DX: Anxiety disorder, unspecified: F41.9

## 2020-08-20 LAB — BASIC METABOLIC PANEL
Anion gap: 6 (ref 5–15)
BUN: 10 mg/dL (ref 6–20)
CO2: 32 mmol/L (ref 22–32)
Calcium: 9.3 mg/dL (ref 8.9–10.3)
Chloride: 101 mmol/L (ref 98–111)
Creatinine, Ser: 0.89 mg/dL (ref 0.44–1.00)
GFR, Estimated: >60 mL/min (ref >60)
Glucose, Bld: 139 mg/dL — ABNORMAL HIGH (ref 70–99)
Potassium: 3.7 mmol/L (ref 3.5–5.1)
Sodium: 139 mmol/L (ref 135–145)

## 2020-08-20 LAB — GLUCOSE, CAPILLARY: Glucose-Capillary: 157 mg/dL — ABNORMAL HIGH (ref 70–99)

## 2020-08-20 LAB — SARS CORONAVIRUS 2 (TAT 6-24 HRS): SARS Coronavirus 2: NEGATIVE

## 2020-08-20 NOTE — Pre-Procedure Instructions (Addendum)
Shelly [name]  08/20/2020    Your procedure is scheduled on Thursday, August 23, 2020 at 11:30 AM.   Report to [facility] Entrance "A" Admitting Office at 9:30 AM.   Call this number if you have problems the morning of surgery: [phone]   Questions prior to day of surgery, please call [phone] between 8 & 4 PM.   Remember:  Do not eat or drink after midnight Wednesday, 08/22/20  Take these medicines the morning of surgery with A SIP OF WATER: Atorvastatin (Lipitor), Carvedilol (Coreg), Digoxin (Lanoxin), Hydroxyzine (Atarax) - if needed  Stop Aspirin as instructed by surgeon/cardiologist. Stop Vitamins as of today prior to surgery. Do not use NSAIDS (Ibuprofen, Aleve, etc), other Aspirin containing products, Herbal medications or Fish Oil prior to surgery.  Hold Jardiance the day before surgery and the day of surgery. Do not take Metformin day of surgery. Do not take Trulicity the day of surgery.  HOW TO MANAGE YOUR DIABETES BEFORE AND AFTER SURGERY  Why is it important to control my blood sugar before and after surgery? . Improving blood sugar levels before and after surgery helps healing and can limit problems. . A way of improving blood sugar control is eating a healthy diet by: o  Eating less sugar and carbohydrates o  Increasing activity/exercise o  Talking with your doctor about reaching your blood sugar goals . High blood sugars (greater than 180 mg/dL) can raise your risk of infections and slow your recovery, so you will need to focus on controlling your diabetes during the weeks before surgery. . Make sure that the doctor who takes care of your diabetes knows about your planned surgery including the date and location.  How do I manage my blood sugar before surgery? . Check your blood sugar at least 4 times a day, starting 2 days before surgery, to make sure that the level is not too high or low. . Check your blood sugar the morning of your surgery when you  wake up and every 2 hours until you get to the Short Stay unit. o If your blood sugar is less than 70 mg/dL, you will need to treat for low blood sugar: - Do not take insulin. - Treat a low blood sugar (less than 70 mg/dL) with  cup of clear juice (cranberry or apple), 4 glucose tablets, OR glucose gel. - Recheck blood sugar in 15 minutes after treatment (to make sure it is greater than 70 mg/dL). If your blood sugar is not greater than 70 mg/dL on recheck, call [phone] for further instructions. . Report your blood sugar to the short stay nurse when you get to Short Stay.  . If you are admitted to the hospital after surgery: o Your blood sugar will be checked by the staff and you will probably be given insulin after surgery (instead of oral diabetes medicines) to make sure you have good blood sugar levels. o The goal for blood sugar control after surgery is 80-180 mg/dL.  Do NOT smoke 24 hours prior to surgery.    Do not wear jewelry, make-up or nail polish.  Do not wear lotions, powders, perfumes or deodorant.  Do not shave 48 hours prior to surgery.   Do not bring valuables to the hospital.  [facility] is not responsible for any belongings or valuables.  Contacts, dentures or bridgework may not be worn into surgery.  Leave your suitcase in the car.  After surgery it may be brought to your room.  For patients admitted to the hospital, discharge time will be determined by your treatment team.  Patients discharged the day of surgery will not be allowed to drive home.   [facility] - Preparing for Surgery  Before surgery, you can play an important role.  Because skin is not sterile, your skin needs to be as free of germs as possible.  You can reduce the number of germs on you skin by washing with CHG (chlorahexidine gluconate) soap before surgery.  CHG is an antiseptic cleaner which kills germs and bonds with the skin to continue killing germs even after washing.  Oral Hygiene is also  important in reducing the risk of infection.  Remember to brush your teeth with your regular toothpaste the morning of surgery.  Please DO NOT use if you have an allergy to CHG or antibacterial soaps.  If your skin becomes reddened/irritated stop using the CHG and inform your nurse when you arrive at Short Stay.  Do not shave (including legs and underarms) for at least 48 hours prior to the first CHG shower.  You may shave your face.  Please follow these instructions carefully:   1.  Shower with CHG Soap the night before surgery and the morning of Surgery.  2.  If you choose to wash your hair, wash your hair first as usual with your normal shampoo.  3.  After you shampoo, rinse your hair and body thoroughly to remove the shampoo. 4.  Use CHG as you would any other liquid soap.  You can apply chg directly to the skin and wash gently with a      scrungie or washcloth.           5.  Apply the CHG Soap to your body ONLY FROM THE NECK DOWN.   Do not use on open wounds or open sores. Avoid contact with your eyes, ears, mouth and genitals (private parts).  Wash genitals (private parts) with your normal soap, do this prior to using CHG soap.  6.  Wash thoroughly, paying special attention to the area where your surgery will be performed.  7.  Thoroughly rinse your body with warm water from the neck down.  8.  DO NOT shower/wash with your normal soap after using and rinsing off the CHG Soap.  9.  Pat yourself dry with a clean towel.            10.  Wear clean pajamas.            11.  Place clean sheets on your bed the night of your first shower and do not sleep with pets.  Day of Surgery  Shower as above. Do not apply any lotions/deodorants the morning of surgery.   Please wear clean clothes to the hospital. Remember to brush your teeth with toothpaste  Please read over the fact sheets that you were given.

## 2020-08-20 NOTE — Progress Notes (Signed)
PCP - Mauricio Po, NP Cardiologist - Dr. Eldridge Dace and Dr. Gala Romney  Chest x-ray - 10/13/19 EKG - 03/15/20 ECHO - 10/14/19  Cardiac Cath - 10/14/19  Fasting Blood Sugar -  Between low 120's - high 130's CBG today was 157 Checks Blood Sugar ___1__ times a day  Aspirin Instructions: has not been instructed. I instructed her to call her cardiologist or surgeon to find out what they instruct.   COVID TEST- done today, result pending   Anesthesia review: yes, Heart and Diabetes  Patient denies shortness of breath, fever, cough and chest pain at PAT appointment   All instructions explained to the patient, with a verbal understanding of the material. Patient agrees to go over the instructions while at home for a better understanding. Patient also instructed to self quarantine after being tested for COVID-19. The opportunity to ask questions was provided.

## 2020-08-21 NOTE — Anesthesia Preprocedure Evaluation (Addendum)
Anesthesia Evaluation  Patient identified by MRN, date of birth, ID band Patient awake    Reviewed: Allergy & Precautions, NPO status , Patient's Chart, lab work & pertinent test results, reviewed documented beta blocker date and time   History of Anesthesia Complications Negative for: history of anesthetic complications  Airway Mallampati: II  TM Distance: >3 FB Neck ROM: Full    Dental  (+) Edentulous Lower, Edentulous Upper   Pulmonary Current Smoker and Patient abstained from smoking.,    Pulmonary exam normal        Cardiovascular hypertension, Pt. on medications and Pt. on home beta blockers + CAD, + Past MI and +CHF  Normal cardiovascular exam  TTE 2021: EF 20-25%, global hypokinesis, severe LVE, severe RV systolic dysfunction, mildly elevated PASP, mild LAE/RAE, mild MR, bicuspid AV with no AR/AS noted    Neuro/Psych Anxiety negative neurological ROS     GI/Hepatic negative GI ROS, Neg liver ROS,   Endo/Other  diabetes, Type 2, Oral Hypoglycemic Agents, Insulin Dependent  Renal/GU negative Renal ROS  negative genitourinary   Musculoskeletal  (+) Arthritis ,   Abdominal   Peds  Hematology negative hematology ROS (+)   Anesthesia Other Findings Day of surgery medications reviewed with patient.  Reproductive/Obstetrics negative OB ROS                           Anesthesia Physical Anesthesia Plan  ASA: IV  Anesthesia Plan: General   Post-op Pain Management:    Induction: Intravenous  PONV Risk Score and Plan: 2 and Treatment may vary due to age or medical condition, Midazolam, Ondansetron and Dexamethasone  Airway Management Planned: Oral ETT  Additional Equipment: Arterial line  Intra-op Plan:   Post-operative Plan: Extubation in OR  Informed Consent: I have reviewed the patients History and Physical, chart, labs and discussed the procedure including the risks, benefits  and alternatives for the proposed anesthesia with the patient or authorized representative who has indicated his/her understanding and acceptance.     Dental advisory given  Plan Discussed with: CRNA  Anesthesia Plan Comments: (See APP note by Joslyn Hy, FNP )      Anesthesia Quick Evaluation

## 2020-08-21 NOTE — Progress Notes (Signed)
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 Current smoker.   Reviewed heart hx with Dr. Krista Blue.    VS: BP 117/75   Pulse 84   Temp 36.5 C (Oral)   Ht 5\' 8"  (1.727 m)   Wt 84.7 kg   SpO2 99%   BMI 28.40 kg/m   PROVIDERS: - PCP is , NP - Cardiologist is Dema Severin, MD. Last office visit 05/01/20 with 05/03/20, PA - HF cardiologist is Boyce Medici, MD. Last office visit 03/15/20. HF medications last titrated with 05/15/20, [facility] on 07/02/20 - at this visit, pt was "feeling all right"   LABS: Labs reviewed: Acceptable for surgery. (all labs ordered are listed, but only abnormal results are displayed)  Labs Reviewed  GLUCOSE, CAPILLARY - Abnormal; Notable for the following components:      Result Value   Glucose-Capillary 157 (*)    All other components within normal limits  BASIC METABOLIC PANEL - Abnormal; Notable for the following components:   Glucose, Bld 139 (*)    All other components within normal limits     IMAGES: MR cardiac morphology 02/06/20:  1. Subendocardial late gadolinium enhancement consistent with prior infarcts in LAD and RCA territories. >50% transmurality of LGE suggesting nonviability in basal to mid inferior walls, apical anterior/septal walls, and apex. <50% transmurality of LGE suggesting viability in mid anterior wall. 2.  Mild LV dilatation with severe systolic dysfunction (EF 30%) 3.  Normal RV size and systolic function (EF 66%)   EKG 03/15/20: SR with  occasional PVCs. Inferior infarct , age undetermined. Anterolateral infarct , age undetermined   CV: R/L cardiac cath 10/14/19:   Prox RCA to Mid RCA lesion is 40% stenosed.  Dist RCA lesion is 50% stenosed.  RPAV lesion is 30% stenosed.  Prox Cx to Mid Cx lesion is 40% stenosed.  3rd Mrg lesion is 20% stenosed.  Mid LAD to Dist LAD lesion is 50% stenosed. Assessment:  1. CAD with diffuse non-obstructive disease and chronic dissection of mid LAD 2. 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 Elevated filling pressures with moderately reduced output   Echo 10/14/19:  1. Left ventricular ejection fraction 20 to 25%. The left ventricle has severely decreased function. The left ventricle demonstrates global hypokinesis. The left ventricular internal cavity size was severely dilated. Left ventricular diastolic function could not be evaluated.  2. Right ventricular systolic function is severely reduced. The right  ventricular size is mildly enlarged. There is mildly elevated pulmonary  artery systolic pressure.  3. Left atrial size was mildly dilated.  4. Right atrial size was mildly dilated.  5. The mitral valve is normal in structure. Mild mitral valve regurgitation. No evidence of mitral stenosis.  6. The aortic valve is bicuspid. Aortic valve regurgitation is not visualized. No aortic stenosis is present.  7. The inferior vena cava is dilated in size with <50% respiratory variability, suggesting right atrial pressure of 15 mmHg.  Past Medical History:  Diagnosis Date  . Anemia   . Anxiety   . Arthritis   . Cataract    Mixed OU  . CHF (congestive heart failure) (HCC)   . Coronary artery disease   . Diabetes mellitus without complication (HCC)   . Diabetic retinopathy (HCC)    PDR OU  . Headache    Cluster headaches in the past  . History of kidney stones    "2 sitting"  . Hypertension   . Hypertensive retinopathy    OU  . Myocardial infarction (HCC)   .  Pneumonia   . Tobacco abuse     Past Surgical History:  Procedure Laterality Date  . RIGHT/LEFT HEART CATH AND CORONARY ANGIOGRAPHY N/A 10/14/2019   Procedure: RIGHT/LEFT HEART CATH AND CORONARY ANGIOGRAPHY;  Surgeon: Dolores Patty, MD;  [facility];  Service: Cardiovascular;  Laterality: N/A;    MEDICATIONS: . aspirin EC 81 MG tablet  . atorvastatin (LIPITOR) 80 MG tablet  . carvedilol (COREG) 3.125 MG tablet  . D3-50 1.25 MG (50000 UT) capsule  . digoxin (LANOXIN) 0.125 MG tablet  . Dulaglutide (TRULICITY) 1.5 MG/0.5ML SOPN  . empagliflozin (JARDIANCE) 25 MG TABS tablet  . furosemide (LASIX) 20 MG tablet  . hydrOXYzine (ATARAX/VISTARIL) 25 MG tablet  . metFORMIN (GLUCOPHAGE) 1000 MG tablet  . potassium chloride (KLOR-CON) 10 MEQ tablet  . sacubitril-valsartan (ENTRESTO) 49-51 MG  . spironolactone (ALDACTONE) 25 MG tablet  . vitamin B-12 (CYANOCOBALAMIN) 1000 MCG tablet   No current facility-administered medications for this encounter.    If no changes, I anticipate pt can proceed with surgery as scheduled.   Rica Mast, PhD, FNP-BC [facility]/Anesthesiology Phone: [phone] 08/21/2020 3:03 PM

## 2020-08-22 NOTE — Progress Notes (Signed)
[facility] - Clinic Note  08/24/2020     CHIEF COMPLAINT Patient presents for Post-op Follow-up   HISTORY OF PRESENT ILLNESS: Shelly [name] is a 50 y.o. female who presents to the clinic today for:   HPI    Post-op Follow-up    In left eye.  Discomfort includes none.  Vision is blurred at distance and is blurred at near.  I, the attending physician,  performed the HPI with the patient and updated documentation appropriately.          Comments    50 y/o female pt here for 1 day po s/p PPV OS 3.17.22.  Didn't sleep much last night.  OS feels a little sore, and pt has some minor FBS OS.  Eyepatch removed in office this morning.  VA very blurred OS.  No change in [place] OD.  Denies FOL, floaters.       Last edited by [name], MD on 08/24/2020  8:30 AM. (History)    pt states she slept until 1am after she got home from sx yesterday, but had a hard time sleeping after that, she states she slept on her stomach and side  Referring physician: Dema Severin, NP [address] [place],  [place] [address]  HISTORICAL INFORMATION:   Selected notes from the MEDICAL RECORD NUMBER Referred by Dr. Aletta Edouard for concern of decreased VA OU   CURRENT MEDICATIONS: No current outpatient medications on file. (Ophthalmic Drugs)   No current facility-administered medications for this visit. (Ophthalmic Drugs)   Current Outpatient Medications (Other)  Medication Sig  . aspirin EC 81 MG tablet Take 1 tablet (81 mg total) by mouth daily.  Marland Kitchen atorvastatin (LIPITOR) 80 MG tablet Take 1 tablet (80 mg total) by mouth daily.  . carvedilol (COREG) 3.125 MG tablet Take 1 tablet (3.125 mg total) by mouth 2 (two) times daily.  . D3-50 1.25 MG (50000 UT) capsule Take by mouth. (Patient not taking: Reported on 08/15/2020)  . digoxin (LANOXIN) 0.125 MG tablet Take 1 tablet (125 mcg total) by mouth daily.  . Dulaglutide (TRULICITY) 1.5 MG/0.5ML SOPN Inject 1.5 mg into the skin once a week.  .  empagliflozin (JARDIANCE) 25 MG TABS tablet Take 1 tablet (25 mg total) by mouth daily.  . furosemide (LASIX) 20 MG tablet Take 20 mg by mouth daily.  . hydrOXYzine (ATARAX/VISTARIL) 25 MG tablet Take 25 mg by mouth 2 (two) times daily as needed for anxiety (sleep).  . metFORMIN (GLUCOPHAGE) 1000 MG tablet Take 1,000 mg by mouth in the morning and at bedtime.  . potassium chloride (KLOR-CON) 10 MEQ tablet Take 4 tablets (40 mEq total) by mouth daily. (Patient taking differently: Take 20 mEq by mouth daily.)  . sacubitril-valsartan (ENTRESTO) 49-51 MG Take 1 tablet by mouth 2 (two) times daily.  Marland Kitchen spironolactone (ALDACTONE) 25 MG tablet Take 1 tablet (25 mg total) by mouth daily.  . vitamin B-12 (CYANOCOBALAMIN) 1000 MCG tablet Take 1,000 mcg by mouth daily.   No current facility-administered medications for this visit. (Other)      REVIEW OF SYSTEMS: ROS    Positive for: Endocrine, Cardiovascular, Eyes   Negative for: Constitutional, Gastrointestinal, Neurological, Skin, Genitourinary, Musculoskeletal, HENT, Respiratory, Psychiatric, Allergic/Imm, Heme/Lymph   Last edited by Celine Mans, COA on 08/24/2020  8:21 AM. (History)       ALLERGIES No Known Allergies  PAST MEDICAL HISTORY Past Medical History:  Diagnosis Date  . Anemia   . Anxiety   .  Arthritis   . Cataract    Mixed OU  . CHF (congestive heart failure) (HCC)   . Coronary artery disease   . Diabetes mellitus without complication (HCC)   . Diabetic retinopathy (HCC)    PDR OU  . Headache    Cluster headaches in the past  . History of kidney stones    "2 sitting"  . Hypertension   . Hypertensive retinopathy    OU  . Myocardial infarction (HCC)   . Pneumonia   . Tobacco abuse    Past Surgical History:  Procedure Laterality Date  . EYE SURGERY Left 08/23/2020   PPV - Dr. [name]  . INJECTION OF SILICONE OIL Left 08/23/2020   Procedure: INJECTION OF SILICONE OIL;  Surgeon: [name], MD;   Location: [facility] OR;  Service: Ophthalmology;  Laterality: Left;  . MEMBRANE PEEL Left 08/23/2020   Procedure: MEMBRANE PEEL;  Surgeon: [name], MD;  Location: [facility] OR;  Service: Ophthalmology;  Laterality: Left;  . PARS PLANA VITRECTOMY Left 08/23/2020   Procedure: PARS PLANA VITRECTOMY WITH 25 GAUGE WITH ENDOLASER;  Surgeon: [name], MD;  Location: [facility] OR;  Service: Ophthalmology;  Laterality: Left;  . 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/23/2020   Procedure: REPAIR OF COMPLEX TRACTION RETINAL DETACHMENT;  Surgeon: [name], MD;  Location: [facility] OR;  Service: Ophthalmology;  Laterality: Left;  . RIGHT/LEFT HEART CATH AND CORONARY ANGIOGRAPHY N/A 10/14/2019   Procedure: RIGHT/LEFT HEART CATH AND CORONARY ANGIOGRAPHY;  Surgeon: Dolores Patty, MD;  [facility];  Service: Cardiovascular;  Laterality: N/A;    FAMILY HISTORY Family History  Problem Relation Age of Onset  . Sudden Cardiac Death Mother   . CAD Father   . CAD Paternal Grandmother   . CAD Paternal Grandfather     SOCIAL HISTORY Social History   Tobacco Use  . Smoking status: Current Every Day Smoker    Packs/day: 1.50  . Smokeless tobacco: Never Used  Vaping Use  . Vaping Use: Never used  Substance Use Topics  . Alcohol use: Not Currently  . Drug use: Never         OPHTHALMIC EXAM:  Base Eye Exam    Visual Acuity (Snellen - Linear)      Right Left   Dist [place] Def CF @ face   Dist ph [place]  NI       Tonometry (Tonopen, 8:24 AM)      Right Left   Pressure Def 15       Pupils      Dark Light Shape React APD   Right 4 3 Round Minimal None   Left 4 4 Round Minimal None  Pharm dil OS       Visual Fields (Counting fingers)      Left Right   Restrictions Partial outer superior temporal, inferior temporal, superior nasal, inferior nasal deficiencies Partial inner superior temporal, inferior temporal, superior nasal deficiencies       Extraocular Movement      Right  Left    Full, Ortho Full, Ortho       Neuro/Psych    Oriented x3: Yes   Mood/Affect: Normal       Dilation    Left eye: 1.0% Mydriacyl, 2.5% Phenylephrine @ 8:24 AM        Slit Lamp and Fundus Exam    Slit Lamp Exam      Right Left   Lids/Lashes Dermatochalasis - upper lid, Meibomian  gland dysfunction Dermatochalasis - upper lid, Meibomian gland dysfunction   Conjunctiva/Sclera White and quiet Mild Subconjunctival hemorrhage, Chemosis, mild endo heme inferiorly   Cornea 2-3+ fine Punctate epithelial erosions Epithelial defect, 3+ Descemet's folds   Anterior Chamber deep, clear, narrow angles deep, clear, narrow angles   Iris Round and dilated, No NVI Round and dilated, No NVI   Lens 1-2+ Nuclear sclerosis, 2+ Cortical cataract 2+ Nuclear sclerosis, 2+ Cortical cataract, 1+ Posterior subcapsular cataract   Vitreous blood stained vitreous condensations post vitrectomy, good silicone oil fill       Fundus Exam      Right Left   Disc  hazy view, mild pallor; fibrosis improved/removed   Macula  hazy view, fibrosis improved   Vessels  Hazy view, Severe attenuated, fibrosis improved   Periphery  Hazy view, attached, fibrosis improved, ORIGINALLY: Extensive fibrosis, +TRD, boat shaped subhyaloid heme inferiorly          IMAGING AND PROCEDURES  Imaging and Procedures for 08/24/2020           ASSESSMENT/PLAN:    ICD-10-CM   1. Proliferative diabetic retinopathy of both eyes with macular edema associated with type 2 diabetes mellitus (HCC)  J82.5053   2. Retinal edema  H35.81   3. 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 Essential hypertension  I10   5. Hypertensive retinopathy of both eyes  H35.033   6. Combined forms of age-related cataract of both eyes  H25.813     1-3. Severe proliferative diabetic retinopathy with edema OU  - FA (02.04.22) shows florid NV with leakage and pooling OU (OS > OD)   - OD: severe tractional fibrosis, edema, and TRD  - OS:  With  TRD and subhyaloid heme             - s/p IVA OD #1 (02.08.22), #2 (03.08.22)  - s/p IVE OS #1 (02.04.22) -- sample, #2 (03.08.22) -- sample  - s/p PRP OD (02.04.22)             - s/p PRP OS (2.8.22)  - exam shows regression of NV but consolidation of fibrosis and TRD OU - OCT shows severe tractional fibrosis and edema OU, now TRD OU - discussed findings and severity of disease, guarded prognosis and extensive treatments and follow ups need to stabilize eyes and improve vision -- will likely need surgery OU  - now POD1 s/p PPV/POC/EL/FAX/SO + IVA OS, 03.17.2022             - doing well this morning             - fibrosis improved             - IOP good at 15             - start   PF 4x/day OS                         zymaxid QID OS                          Atropine BID OS                          Brimonidine BID OS                         PSO ung QID OS              -  cont face down positioning x3 days; avoid laying flat on back              - eye shield when sleeping              - post op drop and positioning instructions reviewed              - tylenol/ibuprofen for pain  - f/u 6 days -- POV  4,5. Hypertensive retinopathy OU - discussed importance of tight BP control - monitor  6. Mixed Cataract OU - The symptoms of cataract, surgical options, and treatments and risks were discussed with patient. - discussed diagnosis and progression - not yet visually significant - monitor for now    Ophthalmic Meds Ordered this visit:  No orders of the defined types were placed in this encounter.      Return in about 6 days (around 08/30/2020) for f/u PDR, POV.  There are no Patient Instructions on file for this visit.  This document serves as a record of services personally performed by Karie Chimera, MD, PhD. It was created on their behalf by Glee Arvin. Manson Passey, OA an ophthalmic technician. The creation of this record is the provider's dictation and/or activities during the visit.     Electronically signed by: Glee Arvin. Manson Passey, [place] 03.16.2022 12:14 AM  Karie Chimera, M.D., Ph.D. Diseases & Surgery of the Retina and Vitreous [facility]  I have reviewed the above documentation for accuracy and completeness, and I agree with the above. Karie Chimera, M.D., Ph.D. 08/25/20 5:00 PM   Abbreviations: M myopia (nearsighted); A astigmatism; H hyperopia (farsighted); P presbyopia; Mrx spectacle prescription;  CTL contact lenses; OD right eye; OS left eye; OU both eyes  XT exotropia; ET esotropia; PEK punctate epithelial keratitis; PEE punctate epithelial erosions; DES dry eye syndrome; MGD meibomian gland dysfunction; ATs artificial tears; PFAT's preservative free artificial tears; NSC nuclear sclerotic cataract; PSC posterior subcapsular cataract; ERM epi-retinal membrane; PVD posterior vitreous detachment; RD retinal detachment; DM diabetes mellitus; DR diabetic retinopathy; NPDR non-proliferative diabetic retinopathy; PDR proliferative diabetic retinopathy; CSME clinically significant macular edema; DME diabetic macular edema; dbh dot blot hemorrhages; CWS cotton wool spot; POAG primary open angle glaucoma; C/D cup-to-disc ratio; HVF humphrey visual field; GVF goldmann visual field; OCT optical coherence tomography; IOP intraocular pressure; BRVO Branch retinal vein occlusion; CRVO central retinal vein occlusion; CRAO central retinal artery occlusion; BRAO branch retinal artery occlusion; RT retinal tear; SB scleral buckle; PPV pars plana vitrectomy; VH Vitreous hemorrhage; PRP panretinal laser photocoagulation; IVK intravitreal kenalog; VMT vitreomacular traction; MH Macular hole;  NVD neovascularization of the disc; NVE neovascularization elsewhere; AREDS age related eye disease study; ARMD age related macular degeneration; POAG primary open angle glaucoma; EBMD epithelial/anterior basement membrane dystrophy; ACIOL anterior chamber intraocular lens; IOL  intraocular lens; PCIOL posterior chamber intraocular lens; Phaco/IOL phacoemulsification with intraocular lens placement; PRK photorefractive keratectomy; LASIK laser assisted in situ keratomileusis; HTN hypertension; DM diabetes mellitus; COPD chronic obstructive pulmonary disease

## 2020-08-23 ENCOUNTER — Ambulatory Visit (HOSPITAL_COMMUNITY)
Admission: RE | Admit: 2020-08-23 | Discharge: 2020-08-23 | Disposition: A | Discharge status: Home / Self Care | Payer: 59 | Attending: Ophthalmology | Admitting: Ophthalmology

## 2020-08-23 ENCOUNTER — Encounter (HOSPITAL_COMMUNITY)
Admission: RE | Disposition: A | Discharge status: Home / Self Care | Payer: Self-pay | Source: Home / Self Care | Attending: Ophthalmology

## 2020-08-23 ENCOUNTER — Ambulatory Visit (HOSPITAL_COMMUNITY): Payer: 59 | Admitting: Emergency Medicine

## 2020-08-23 ENCOUNTER — Encounter (HOSPITAL_COMMUNITY): Payer: Self-pay | Admitting: Ophthalmology

## 2020-08-23 ENCOUNTER — Ambulatory Visit (HOSPITAL_COMMUNITY): Payer: 59 | Admitting: Anesthesiology

## 2020-08-23 ENCOUNTER — Other Ambulatory Visit: Payer: Self-pay

## 2020-08-23 DIAGNOSIS — I110 Hypertensive heart disease with heart failure: Secondary | ICD-10-CM | POA: Diagnosis not present

## 2020-08-23 DIAGNOSIS — H3342 Traction detachment of retina, left eye: Secondary | ICD-10-CM | POA: Diagnosis present

## 2020-08-23 DIAGNOSIS — Z794 Long term (current) use of insulin: Secondary | ICD-10-CM | POA: Insufficient documentation

## 2020-08-23 DIAGNOSIS — Z8249 Family history of ischemic heart disease and other diseases of the circulatory system: Secondary | ICD-10-CM | POA: Diagnosis not present

## 2020-08-23 DIAGNOSIS — I509 Heart failure, unspecified: Secondary | ICD-10-CM | POA: Insufficient documentation

## 2020-08-23 DIAGNOSIS — F17200 Nicotine dependence, unspecified, uncomplicated: Secondary | ICD-10-CM | POA: Diagnosis not present

## 2020-08-23 DIAGNOSIS — E113522 Type 2 diabetes mellitus with proliferative diabetic retinopathy with traction retinal detachment involving the macula, left eye: Secondary | ICD-10-CM | POA: Diagnosis not present

## 2020-08-23 DIAGNOSIS — H35372 Puckering of macula, left eye: Secondary | ICD-10-CM | POA: Insufficient documentation

## 2020-08-23 HISTORY — PX: EYE SURGERY: SHX253

## 2020-08-23 HISTORY — PX: REPAIR OF COMPLEX TRACTION RETINAL DETACHMENT: SHX6217

## 2020-08-23 HISTORY — PX: PARS PLANA VITRECTOMY: SHX2166

## 2020-08-23 HISTORY — PX: MEMBRANE PEEL: SHX5967

## 2020-08-23 HISTORY — PX: INJECTION OF SILICONE OIL: SHX6422

## 2020-08-23 LAB — GLUCOSE, CAPILLARY
Glucose-Capillary: 148 mg/dL — ABNORMAL HIGH (ref 70–99)
Glucose-Capillary: 182 mg/dL — ABNORMAL HIGH (ref 70–99)

## 2020-08-23 SURGERY — REPAIR, RETINAL DETACHMENT, COMPLEX
Anesthesia: General | Site: Eye | Laterality: Left

## 2020-08-23 MED ORDER — EPINEPHRINE PF 1 MG/ML IJ SOLN
INTRAMUSCULAR | Status: DC | PRN
  Administered 2020-08-23: .3 mL

## 2020-08-23 MED ORDER — BSS IO SOLN
INTRAOCULAR | Status: AC
  Filled 2020-08-23: qty 15

## 2020-08-23 MED ORDER — NEOMYCIN-POLYMYXIN-DEXAMETH 3.5-10000-0.1 OP SUSP
OPHTHALMIC | Status: DC | PRN
  Administered 2020-08-23: 1 [drp] via OPHTHALMIC

## 2020-08-23 MED ORDER — BSS PLUS IO SOLN
INTRAOCULAR | Status: AC
  Filled 2020-08-23: qty 500

## 2020-08-23 MED ORDER — BRILLIANT BLUE G 0.025 % IO SOSY
0.5000 mL | PREFILLED_SYRINGE | INTRAOCULAR | Status: DC
  Filled 2020-08-23: qty 0.5

## 2020-08-23 MED ORDER — VISTASEAL 10 ML SINGLE DOSE KIT
10.0000 mL | PACK | Freq: Once | CUTANEOUS | Status: DC
  Filled 2020-08-23: qty 10

## 2020-08-23 MED ORDER — DORZOLAMIDE HCL-TIMOLOL MAL 2-0.5 % OP SOLN
OPHTHALMIC | Status: AC
  Filled 2020-08-23: qty 10

## 2020-08-23 MED ORDER — LIDOCAINE 2% (20 MG/ML) 5 ML SYRINGE
INTRAMUSCULAR | Status: DC | PRN
  Administered 2020-08-23: 100 mg via INTRAVENOUS

## 2020-08-23 MED ORDER — POLYMYXIN B SULFATE 500000 UNITS IJ SOLR
INTRAMUSCULAR | Status: AC
  Filled 2020-08-23: qty 500000

## 2020-08-23 MED ORDER — GATIFLOXACIN 0.5 % OP SOLN OPTIME - NO CHARGE
OPHTHALMIC | Status: DC | PRN
  Administered 2020-08-23: 1 [drp] via OPHTHALMIC

## 2020-08-23 MED ORDER — NA CHONDROIT SULF-NA HYALURON 40-30 MG/ML IO SOSY
INTRAOCULAR | Status: DC | PRN
  Administered 2020-08-23: 0.5 mL via INTRAOCULAR

## 2020-08-23 MED ORDER — PHENYLEPHRINE HCL-NACL 10-0.9 MG/250ML-% IV SOLN
INTRAVENOUS | Status: DC | PRN
  Administered 2020-08-23: 25 ug/min via INTRAVENOUS

## 2020-08-23 MED ORDER — SODIUM CHLORIDE 0.9 % IV SOLN: INTRAVENOUS | Status: DC

## 2020-08-23 MED ORDER — DORZOLAMIDE HCL-TIMOLOL MAL 2-0.5 % OP SOLN
OPHTHALMIC | Status: DC | PRN
  Administered 2020-08-23: 1 [drp] via OPHTHALMIC

## 2020-08-23 MED ORDER — TRIAMCINOLONE ACETONIDE 40 MG/ML IJ SUSP
INTRAMUSCULAR | Status: DC | PRN
  Administered 2020-08-23: .5 mL

## 2020-08-23 MED ORDER — PREDNISOLONE ACETATE 1 % OP SUSP
OPHTHALMIC | Status: DC | PRN
  Administered 2020-08-23: 1 [drp] via OPHTHALMIC

## 2020-08-23 MED ORDER — STERILE WATER FOR INJECTION IJ SOLN
INTRAMUSCULAR | Status: DC | PRN
  Administered 2020-08-23: 20 mL

## 2020-08-23 MED ORDER — ORAL CARE MOUTH RINSE: 15.0000 mL | Freq: Once | OROMUCOSAL | Status: AC

## 2020-08-23 MED ORDER — ONDANSETRON HCL 4 MG/2ML IJ SOLN
INTRAMUSCULAR | Status: DC | PRN
  Administered 2020-08-23: 4 mg via INTRAVENOUS

## 2020-08-23 MED ORDER — ATROPINE SULFATE 1 % OP SOLN
OPHTHALMIC | Status: AC
  Filled 2020-08-23: qty 5

## 2020-08-23 MED ORDER — TRIAMCINOLONE ACETONIDE 40 MG/ML IJ SUSP
INTRAMUSCULAR | Status: AC
  Filled 2020-08-23: qty 5

## 2020-08-23 MED ORDER — BACITRACIN-POLYMYXIN B 500-10000 UNIT/GM OP OINT
TOPICAL_OINTMENT | OPHTHALMIC | Status: AC
  Filled 2020-08-23: qty 3.5

## 2020-08-23 MED ORDER — BRIMONIDINE TARTRATE 0.2 % OP SOLN
OPHTHALMIC | Status: DC | PRN
  Administered 2020-08-23: 1 [drp] via OPHTHALMIC

## 2020-08-23 MED ORDER — BSS PLUS IO SOLN
INTRAOCULAR | Status: DC | PRN
  Administered 2020-08-23: 1 via INTRAOCULAR

## 2020-08-23 MED ORDER — ROCURONIUM BROMIDE 10 MG/ML (PF) SYRINGE
PREFILLED_SYRINGE | INTRAVENOUS | Status: DC | PRN
  Administered 2020-08-23: 50 mg via INTRAVENOUS
  Administered 2020-08-23 (×2): 20 mg via INTRAVENOUS

## 2020-08-23 MED ORDER — DEXAMETHASONE SODIUM PHOSPHATE 10 MG/ML IJ SOLN
INTRAMUSCULAR | Status: AC
  Filled 2020-08-23: qty 1

## 2020-08-23 MED ORDER — GATIFLOXACIN 0.5 % OP SOLN
OPHTHALMIC | Status: AC
  Filled 2020-08-23: qty 2.5

## 2020-08-23 MED ORDER — ETOMIDATE 2 MG/ML IV SOLN
INTRAVENOUS | Status: DC | PRN
  Administered 2020-08-23: 20 mg via INTRAVENOUS

## 2020-08-23 MED ORDER — CARBACHOL 0.01 % IO SOLN
INTRAOCULAR | Status: AC
  Filled 2020-08-23: qty 1.5

## 2020-08-23 MED ORDER — TROPICAMIDE 1 % OP SOLN
1.0000 [drp] | OPHTHALMIC | Status: AC | PRN
  Administered 2020-08-23 (×3): 1 [drp] via OPHTHALMIC
  Filled 2020-08-23: qty 15

## 2020-08-23 MED ORDER — FENTANYL CITRATE (PF) 250 MCG/5ML IJ SOLN
INTRAMUSCULAR | Status: AC
  Filled 2020-08-23: qty 5

## 2020-08-23 MED ORDER — SODIUM CHLORIDE (PF) 0.9 % IJ SOLN
INTRAMUSCULAR | Status: AC
  Filled 2020-08-23: qty 10

## 2020-08-23 MED ORDER — PROPARACAINE HCL 0.5 % OP SOLN
1.0000 [drp] | OPHTHALMIC | Status: AC | PRN
  Administered 2020-08-23 (×3): 1 [drp] via OPHTHALMIC
  Filled 2020-08-23: qty 15

## 2020-08-23 MED ORDER — NA CHONDROIT SULF-NA HYALURON 40-30 MG/ML IO SOSY
0.5000 mL | INTRAOCULAR | Status: DC
  Filled 2020-08-23: qty 0.5

## 2020-08-23 MED ORDER — FENTANYL CITRATE (PF) 250 MCG/5ML IJ SOLN
INTRAMUSCULAR | Status: DC | PRN
  Administered 2020-08-23 (×2): 25 ug via INTRAVENOUS
  Administered 2020-08-23: 100 ug via INTRAVENOUS

## 2020-08-23 MED ORDER — BEVACIZUMAB CHEMO INJECTION 1.25MG/0.05ML SYRINGE FOR KALEIDOSCOPE
1.2500 mg | Freq: Once | INTRAVITREAL | Status: AC
  Administered 2020-08-23: 1.25 mg via INTRAVITREAL
  Filled 2020-08-23 (×2): qty 0.1

## 2020-08-23 MED ORDER — CARVEDILOL 3.125 MG PO TABS
3.1250 mg | ORAL_TABLET | Freq: Once | ORAL | Status: AC
  Administered 2020-08-23: 3.125 mg via ORAL
  Filled 2020-08-23: qty 1

## 2020-08-23 MED ORDER — PROPOFOL 10 MG/ML IV BOLUS
INTRAVENOUS | Status: AC
  Filled 2020-08-23: qty 40

## 2020-08-23 MED ORDER — MIDAZOLAM HCL 2 MG/2ML IJ SOLN
INTRAMUSCULAR | Status: AC
  Filled 2020-08-23: qty 2

## 2020-08-23 MED ORDER — ATROPINE SULFATE 1 % OP SOLN
1.0000 [drp] | OPHTHALMIC | Status: AC | PRN
  Administered 2020-08-23 (×3): 1 [drp] via OPHTHALMIC
  Filled 2020-08-23: qty 5

## 2020-08-23 MED ORDER — CHLORHEXIDINE GLUCONATE 0.12 % MT SOLN
15.0000 mL | Freq: Once | OROMUCOSAL | Status: AC
  Administered 2020-08-23: 15 mL via OROMUCOSAL
  Filled 2020-08-23: qty 15

## 2020-08-23 MED ORDER — PREDNISOLONE ACETATE 1 % OP SUSP
OPHTHALMIC | Status: AC
  Filled 2020-08-23: qty 5

## 2020-08-23 MED ORDER — KETOROLAC TROMETHAMINE 30 MG/ML IJ SOLN
INTRAMUSCULAR | Status: AC
  Filled 2020-08-23: qty 1

## 2020-08-23 MED ORDER — EPINEPHRINE PF 1 MG/ML IJ SOLN
INTRAMUSCULAR | Status: AC
  Filled 2020-08-23: qty 1

## 2020-08-23 MED ORDER — PROPOFOL 10 MG/ML IV BOLUS
INTRAVENOUS | Status: DC | PRN
  Administered 2020-08-23: 40 mg via INTRAVENOUS
  Administered 2020-08-23: 30 mg via INTRAVENOUS
  Administered 2020-08-23: 20 mg via INTRAVENOUS

## 2020-08-23 MED ORDER — LIDOCAINE 2% (20 MG/ML) 5 ML SYRINGE
INTRAMUSCULAR | Status: AC
  Filled 2020-08-23: qty 5

## 2020-08-23 MED ORDER — LIDOCAINE HCL 2 % IJ SOLN
INTRAMUSCULAR | Status: AC
  Filled 2020-08-23: qty 20

## 2020-08-23 MED ORDER — CEFTAZIDIME 1 G IJ SOLR
INTRAMUSCULAR | Status: AC
  Filled 2020-08-23: qty 1

## 2020-08-23 MED ORDER — BRIMONIDINE TARTRATE 0.2 % OP SOLN
OPHTHALMIC | Status: AC
  Filled 2020-08-23: qty 5

## 2020-08-23 MED ORDER — STERILE WATER FOR INJECTION IJ SOLN
INTRAMUSCULAR | Status: AC
  Filled 2020-08-23: qty 10

## 2020-08-23 MED ORDER — DEXAMETHASONE SODIUM PHOSPHATE 10 MG/ML IJ SOLN
INTRAMUSCULAR | Status: DC | PRN
  Administered 2020-08-23: 5 mg via INTRAVENOUS

## 2020-08-23 MED ORDER — SUGAMMADEX SODIUM 200 MG/2ML IV SOLN
INTRAVENOUS | Status: DC | PRN
  Administered 2020-08-23: 200 mg via INTRAVENOUS

## 2020-08-23 MED ORDER — PHENYLEPHRINE 40 MCG/ML (10ML) SYRINGE FOR IV PUSH (FOR BLOOD PRESSURE SUPPORT)
PREFILLED_SYRINGE | INTRAVENOUS | Status: DC | PRN
  Administered 2020-08-23: 80 ug via INTRAVENOUS

## 2020-08-23 MED ORDER — PHENYLEPHRINE HCL 10 % OP SOLN
1.0000 [drp] | OPHTHALMIC | Status: AC | PRN
  Administered 2020-08-23 (×3): 1 [drp] via OPHTHALMIC
  Filled 2020-08-23: qty 5

## 2020-08-23 MED ORDER — GLYCOPYRROLATE PF 0.2 MG/ML IJ SOSY
PREFILLED_SYRINGE | INTRAMUSCULAR | Status: DC | PRN
  Administered 2020-08-23: .2 mg via INTRAVENOUS

## 2020-08-23 MED ORDER — LACTATED RINGERS IV SOLN: INTRAVENOUS | Status: DC

## 2020-08-23 MED ORDER — MIDAZOLAM HCL 2 MG/2ML IJ SOLN
INTRAMUSCULAR | Status: DC | PRN
  Administered 2020-08-23 (×2): 1 mg via INTRAVENOUS

## 2020-08-23 SURGICAL SUPPLY — 66 items
APPLICATOR COTTON TIP 6 STRL (MISCELLANEOUS) ×4 IMPLANT
APPLICATOR COTTON TIP 6IN STRL (MISCELLANEOUS) ×8
BAND WRIST GAS GREEN (MISCELLANEOUS) IMPLANT
BLADE EYE CATARACT 19 1.4 BEAV (BLADE) IMPLANT
BNDG EYE OVAL (GAUZE/BANDAGES/DRESSINGS) ×2 IMPLANT
CABLE BIPOLOR RESECTION CORD (MISCELLANEOUS) ×2 IMPLANT
CANNULA ANT CHAM MAIN (OPHTHALMIC RELATED) IMPLANT
CANNULA FLEX TIP 25G (CANNULA) ×2 IMPLANT
CANNULA TROCAR 23 GA VLV (OPHTHALMIC) IMPLANT
CANNULA VLV SOFT TIP 25GA (OPHTHALMIC) IMPLANT
CHANDELIER CONSTEL 25G RFID (OPHTHALMIC) ×4 IMPLANT
CLSR STERI-STRIP ANTIMIC 1/2X4 (GAUZE/BANDAGES/DRESSINGS) ×2 IMPLANT
COTTONBALL LRG STERILE PKG (GAUZE/BANDAGES/DRESSINGS) ×6 IMPLANT
COVER WAND RF STERILE (DRAPES) ×2 IMPLANT
DRAPE MICROSCOPE LEICA 46X105 (MISCELLANEOUS) ×2 IMPLANT
DRAPE OPHTHALMIC 77X100 STRL (CUSTOM PROCEDURE TRAY) ×2 IMPLANT
ERASER HMR WETFIELD 23G BP (MISCELLANEOUS) IMPLANT
FILTER BLUE MILLIPORE (MISCELLANEOUS) IMPLANT
FILTER STRAW FLUID ASPIR (MISCELLANEOUS) IMPLANT
FORCEPS GRIESHABER ILM 25G A (INSTRUMENTS) IMPLANT
GAS AUTO FILL CONSTEL (OPHTHALMIC)
GAS AUTO FILL CONSTELLATION (OPHTHALMIC) IMPLANT
GAS WRIST BAND GREEN (MISCELLANEOUS)
GLOVE BIO SURGEON STRL SZ7.5 (GLOVE) ×4 IMPLANT
GLOVE BIOGEL M 7.0 STRL (GLOVE) ×2 IMPLANT
GOWN STRL REUS W/ TWL LRG LVL3 (GOWN DISPOSABLE) ×2 IMPLANT
GOWN STRL REUS W/ TWL XL LVL3 (GOWN DISPOSABLE) ×1 IMPLANT
GOWN STRL REUS W/TWL LRG LVL3 (GOWN DISPOSABLE) ×2
GOWN STRL REUS W/TWL XL LVL3 (GOWN DISPOSABLE) ×1
KIT BASIN OR (CUSTOM PROCEDURE TRAY) ×2 IMPLANT
KIT PERFLUORON PROCEDURE 5ML (MISCELLANEOUS) IMPLANT
LENS MACULAR ASPHERIC CONSTEL (OPHTHALMIC) IMPLANT
LENS VITRECTOMY FLAT OCLR DISP (MISCELLANEOUS) ×2 IMPLANT
LOOP FINESSE 25 GA (MISCELLANEOUS) IMPLANT
MICROPICK 25G (MISCELLANEOUS)
NEEDLE 18GX1X1/2 (RX/OR ONLY) (NEEDLE) ×2 IMPLANT
NEEDLE 25GX 5/8IN NON SAFETY (NEEDLE) ×6 IMPLANT
NEEDLE HYPO 30X.5 LL (NEEDLE) ×4 IMPLANT
NEEDLE PRECISIONGLIDE 27X1.5 (NEEDLE) IMPLANT
NS IRRIG 1000ML POUR BTL (IV SOLUTION) ×2 IMPLANT
OIL SILICONE OPHTHALMIC 1000 (Ophthalmic Related) ×2 IMPLANT
PACK VITRECTOMY CUSTOM (CUSTOM PROCEDURE TRAY) ×2 IMPLANT
PAD ARMBOARD 7.5X6 YLW CONV (MISCELLANEOUS) ×4 IMPLANT
PAK PIK VITRECTOMY CVS 25GA (OPHTHALMIC) ×2 IMPLANT
PENCIL BIPOLAR 25GA STR DISP (OPHTHALMIC RELATED) ×2 IMPLANT
PIC ILLUMINATED 25G (OPHTHALMIC) ×2
PICK MICROPICK 25G (MISCELLANEOUS) IMPLANT
PIK ILLUMINATED 25G (OPHTHALMIC) ×1 IMPLANT
PROBE DIATHERMY DSP 27GA (MISCELLANEOUS) IMPLANT
PROBE ENDO DIATHERMY 25G (MISCELLANEOUS) IMPLANT
PROBE LASER ILLUM FLEX CVD 25G (OPHTHALMIC) IMPLANT
REPL STRA BRUSH NEEDLE (NEEDLE) IMPLANT
RESERVOIR BACK FLUSH (MISCELLANEOUS) IMPLANT
RETRACTOR IRIS FLEX 25G GRIESH (INSTRUMENTS) IMPLANT
SET INJECTOR OIL FLUID CONSTEL (OPHTHALMIC) IMPLANT
SPONGE SURGIFOAM ABS GEL 12-7 (HEMOSTASIS) IMPLANT
STOPCOCK 4 WAY LG BORE MALE ST (IV SETS) IMPLANT
SUT VICRYL 7 0 TG140 8 (SUTURE) ×2 IMPLANT
SYR 10ML LL (SYRINGE) ×2 IMPLANT
SYR 20ML LL LF (SYRINGE) ×2 IMPLANT
SYR 5ML LL (SYRINGE) ×2 IMPLANT
SYR BULB EAR ULCER 3OZ GRN STR (SYRINGE) ×2 IMPLANT
SYR TB 1ML LUER SLIP (SYRINGE) ×4 IMPLANT
TOWEL GREEN STERILE FF (TOWEL DISPOSABLE) ×2 IMPLANT
TUBING HIGH PRESS EXTEN 6IN (TUBING) ×2 IMPLANT
WATER STERILE IRR 1000ML POUR (IV SOLUTION) ×2 IMPLANT

## 2020-08-23 NOTE — Anesthesia Procedure Notes (Signed)
Procedure Name: Intubation Date/Time: 08/23/2020 11:41 AM Performed by: Rande Brunt, CRNA Pre-anesthesia Checklist: Patient identified, Emergency Drugs available, Suction available and Patient being monitored Patient Re-evaluated:Patient Re-evaluated prior to induction Oxygen Delivery Method: Circle System Utilized Preoxygenation: Pre-oxygenation with 100% oxygen Induction Type: IV induction Ventilation: Mask ventilation without difficulty Laryngoscope Size: Mac and 3 Grade View: Grade I Tube type: Oral Tube size: 7.0 mm Number of attempts: 1 Airway Equipment and Method: Stylet and Oral airway Placement Confirmation: ETT inserted through vocal cords under direct vision,  positive ETCO2 and breath sounds checked- equal and bilateral Secured at: 22 cm Tube secured with: Tape Dental Injury: Teeth and Oropharynx as per pre-operative assessment

## 2020-08-23 NOTE — Anesthesia Postprocedure Evaluation (Signed)
Anesthesia Post Note  Patient: Kamla Skilton  Procedure(s) Performed: REPAIR OF COMPLEX TRACTION RETINAL DETACHMENT (Left Eye) MEMBRANE PEEL (Left Eye) PARS PLANA VITRECTOMY WITH 25 GAUGE WITH ENDOLASER (Left Eye) INJECTION OF SILICONE OIL (Left Eye)     Patient location during evaluation: PACU Anesthesia Type: General Level of consciousness: awake and alert and oriented Pain management: pain level controlled Vital Signs Assessment: post-procedure vital signs reviewed and stable Respiratory status: spontaneous breathing, nonlabored ventilation and respiratory function stable Cardiovascular status: blood pressure returned to baseline Postop Assessment: no apparent nausea or vomiting Anesthetic complications: no   No complications documented.  Last Vitals:  Vitals:   08/23/20 1551 08/23/20 1552  BP: (!) 86/52   Pulse: 92   Resp: 20   Temp:    SpO2: 95% 94%    Last Pain:  Vitals:   08/23/20 1521  TempSrc:   PainSc: 0-No pain                 Kaylyn Layer

## 2020-08-23 NOTE — Op Note (Signed)
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 25 gauge pars plana vitrectomy, LEFT EYE 2. Preretinal membrane peel, LEFT EYE 3. Endolaser, LEFT EYE 4. Fluid air exchange, LEFT EYE 5. Injection of [id number] silicon oil, LEFT EYE 6. Intravitreal injection of bevacizumab, LEFT EYE     Indications for procedure: This is a 50 yo F with a history of DM2, HTN, CHF with Proliferative diabetic retinopathy in both eyes, and left eye with preretinal fibrosis and tractional retinal detachment. After discussing the risks, benefits, and alternatives to surgery, the patient electively decided to proceed with surgery and informed consent was obtained. The surgery was an attempt to remove all preretinal/tractional membranes from the left eye and deliver intravitreal Avastin to stabilize her diabetic retinopathy and potentially improve the vision within the reasonable expectations of the surgeon.   Procedure in Detail:    The patient was met in the pre-operative holding area where their identification data was verified.  It was noted that there was a signed, informed consent in the chart and the left eye was verbally verified by the patient for surgery. Then the operative left eye and was marked with the surgeon's initials with a marking pen. The patient was then taken to the operating room and placed in the supine position. General endotracheal anesthesia was induced.   The LEFT EYE was then prepped with 5% betadine and draped in the normal fashion for ophthalmic surgery. The microscope was draped and swung into position, and a secondary time-out was performed to identify the correct patient, eyes, procedures, and any allergies.   A 25 gauge trocar was  inserted in a 30-45 degrees fashion into the inferotemporal quadrant 4 mm posterior to the limbus in this phakic patient. Correct positioning within the vitreous was verified externally with the light pipe. The infusion was then connected to the cannula and BSS infusion was commenced. Similarly, a 25g trocar was placed in the inferonasal quadrant and a 25g chandelier light was inserted and secured through the trocar.  Additional ports were placed in the superonasal and superotemporal quadrants. Viscoat was placed on the cornea. The BIOM was used to visualize the posterior segment. The patient had extensive fibrosis and preretinal membranes overlying the posterior pole and extending peripherally. A core vitrectomy was performed using the BIOM visualization system, vitrectomy probe and light pipe to see the posterior pole. We were able to appreciate a dense preretinal tractional fibrotic membrane/plaque emanating from the optic disc and along the vascular arcades and macula. Of note, the retinal vasculature was very attenuated and sclerotic and a good majority of the macula and posterior pole was ischemic in appearance. Kenalog was used to mark the vitreous and assist in dissecting vitreous membranes.      A macular contact lens was placed on the eye. End-grasping ILM forceps, an endoilluminated pik, the vitrectomy probe and light pipe were used to carefully peel and dissect preretinal membranes and fibrosis off the entire surface of the retina as was deemed safe.   The wide angle viewing system was brought back into position to assist in peeling peripheral membranes. Two small retinal holes were noted in the temporal periphery -- one just outside the ST arcades and the other just outside the IT arcades. They  were marked with diathermy. At this time, panretinal photocoagulation was performed via endolaser 360 and posteriorly almost to the arcades. An air fluid exchange was then performed to remove the intravitreal  BSS and to remove the subretinal fluid via the infero- and supero-temporal breaks. Then, endolaser was used to complete the laser around the breaks and perform fill in PRP anteriorly and posteriorly to existing laser. Then, the inferonasal port and chandelier light were removed and the sclerotomy was closed with a 7-0 vicryl suture. Next, 7639 cs silicon oil was injected via the superonasal trocar and filled up to the level of the lens plane with the aide of the venting cannula.   The inferotemporal and superotemporal ports were then removed and sutured with 7-0 vicryl, there was no leakage. Silicon oil was removed through the superonasal port to lower the eye pressure to a soft physiologic IOP. Then, the superonasal port was removed and sutured with 7-0 vicryl.  There was no leakage from the sclerotomy sites and the eye remained at physiologic pressure by digital palpation.   Subconjunctival injections of kefzol + bacitracin + polymixin b and kenalog were then administered. The eye was prepped again with 5% betadine and 0.05 cc of bevacizumab was injected into the vitreous cavity via 30g needle, 27m posterior to the limbus in the superonasal quadrant. 5% betadine was reapplied to the injection site and then rinsed out of the eye with sterile BSS. The drapes and lid speculum were removed and antibiotic and steroid drops as well as antibiotic ointment were placed in the eye and then the eye was patched and shielded.  The patient was then taken to the post-operative area for recovery having tolerated the procedure well.  She was instructed to perform face down positioning postoperatively and to follow up in clinic the following morning as scheduled.   Estimate blood lost: none Complications: None

## 2020-08-23 NOTE — Interval H&P Note (Signed)
History and Physical Interval Note:  08/23/2020 10:50 AM  Shelly [name]  has presented today for surgery, with the diagnosis of proliferative diabetic retinopathy with tractional retinal detachment, left eye.  The various methods of treatment have been discussed with the patient and family. After consideration of risks, benefits and other options for treatment, the patient has consented to  Procedure(s): REPAIR OF COMPLEX TRACTION RETINAL DETACHMENT (Left) MEMBRANE PEEL (Left) as a surgical intervention.  The patient's history has been reviewed, patient examined, no change in status, stable for surgery.  I have reviewed the patient's chart and labs.  Questions were answered to the patient's satisfaction.     [name]

## 2020-08-23 NOTE — Brief Op Note (Signed)
08/23/2020  3:21 PM  PATIENT:  Shelly [name]  50 y.o. female  PRE-OPERATIVE DIAGNOSIS:  proliferative diabetic retinopathy with tractional retinal detachment, left eye  POST-OPERATIVE DIAGNOSIS:  proliferative diabetic retinopathy with tractional retinal detachment, left eye  PROCEDURE:  Procedure(s): REPAIR OF COMPLEX TRACTION RETINAL DETACHMENT (Left) MEMBRANE PEEL (Left) PARS PLANA VITRECTOMY WITH 25 GAUGE WITH ENDOLASER (Left) INJECTION OF SILICONE OIL (Left)  SURGEON:  Surgeon(s) and Role:    [name], MD - Primary  ASSISTANTS: Laurian Brim, Ophthalmic Assistant    ANESTHESIA:   general  EBL:  minimal   BLOOD ADMINISTERED:none  DRAINS: none   LOCAL MEDICATIONS USED:  NONE  SPECIMEN:  No Specimen  DISPOSITION OF SPECIMEN:  N/A  COUNTS:  YES  TOURNIQUET:  * No tourniquets in log *  DICTATION: .Note written in EPIC  PLAN OF CARE: Discharge to home after PACU  PATIENT DISPOSITION:  PACU - hemodynamically stable.   Delay start of Pharmacological VTE agent (>24hrs) due to surgical blood loss or risk of bleeding: no

## 2020-08-23 NOTE — Transfer of Care (Signed)
Immediate Anesthesia Transfer of Care Note  Patient: Shelly [name]  Procedure(s) Performed: REPAIR OF COMPLEX TRACTION RETINAL DETACHMENT (Left Eye) MEMBRANE PEEL (Left Eye) PARS PLANA VITRECTOMY WITH 25 GAUGE WITH ENDOLASER (Left Eye) INJECTION OF SILICONE OIL (Left Eye)  Patient Location: PACU  Anesthesia Type:General  Level of Consciousness: awake, alert  and oriented  Airway & Oxygen Therapy: Patient Spontanous Breathing  Post-op Assessment: Report given to RN and Post -op Vital signs reviewed and stable  Post vital signs: Reviewed and stable  Last Vitals:  Vitals Value Taken Time  BP 108/55 08/23/20 1521  Temp    Pulse 92 08/23/20 1523  Resp 20 08/23/20 1523  SpO2 93 % 08/23/20 1523  Vitals shown include unvalidated device data.  Last Pain:  Vitals:   08/23/20 0942  TempSrc:   PainSc: 0-No pain         Complications: No complications documented.

## 2020-08-23 NOTE — Anesthesia Procedure Notes (Signed)
Arterial Line Insertion Start/End3/17/2022 11:02 AM, 08/23/2020 11:05 AM Performed by: Kaylyn Layer, MD, anesthesiologist  Patient location: Pre-op. Preanesthetic checklist: patient identified, IV checked, site marked, risks and benefits discussed, surgical consent, monitors and equipment checked, pre-op evaluation, timeout performed and anesthesia consent Lidocaine 1% used for infiltration radial was placed Catheter size: 20 Fr Hand hygiene performed  and maximum sterile barriers used   Attempts: 1 Procedure performed using ultrasound guided technique. Ultrasound Notes:anatomy identified, needle tip was noted to be adjacent to the nerve/plexus identified and no ultrasound evidence of intravascular and/or intraneural injection Following insertion, dressing applied. Post procedure assessment: normal and unchanged  Patient tolerated the procedure well with no immediate complications. Additional procedure comments: Three unsuccessful attempts on right radial artery prior to my presence. Ultrasound access of right radial on my first attempt. Stephannie Peters, MD.

## 2020-08-23 NOTE — Discharge Instructions (Addendum)
POSTOPERATIVE INSTRUCTIONS  Your doctor has performed vitreoretinal surgery on you at [facility].  - Keep eye patched and shielded until seen by Dr. Zamora 8 AM tomorrow in clinic - Do not use drops until return - FACE DOWN POSITIONING WHILE AWAKE - Sleep with belly down or on right side, avoid laying flat on back.    - No strenuous bending, stooping or lifting.  - You may not drive until further notice.  - If your doctor used a gas bubble in your eye during the procedure he will advise you on postoperative positioning. If you have a gas bubble you will be wearing a green bracelet that was applied in the operating room. The green bracelet should stay on as long as the gas bubble is in your eye. While the gas bubble is present you should not fly in an airplane. If you require general anesthesia while the gas bubble is present you must notify your anesthesiologist that an intraocular gas bubble is present so he can take the appropriate precautions.  - Tylenol or any other over-the-counter pain reliever can be used according to your doctor. If more pain medicine is required, your doctor will have a prescription for you.  - You may read, go up and down stairs, and watch television.     Brian Zamora, M.D., Ph.D.  

## 2020-08-24 ENCOUNTER — Ambulatory Visit (INDEPENDENT_AMBULATORY_CARE_PROVIDER_SITE_OTHER): Payer: 59 | Admitting: Ophthalmology

## 2020-08-24 ENCOUNTER — Encounter (INDEPENDENT_AMBULATORY_CARE_PROVIDER_SITE_OTHER): Payer: Self-pay | Admitting: Ophthalmology

## 2020-08-24 DIAGNOSIS — H3342 Traction detachment of retina, left eye: Secondary | ICD-10-CM

## 2020-08-24 DIAGNOSIS — E113513 Type 2 diabetes mellitus with proliferative diabetic retinopathy with macular edema, bilateral: Secondary | ICD-10-CM

## 2020-08-24 DIAGNOSIS — I10 Essential (primary) hypertension: Secondary | ICD-10-CM

## 2020-08-24 DIAGNOSIS — H3581 Retinal edema: Secondary | ICD-10-CM

## 2020-08-24 DIAGNOSIS — H25813 Combined forms of age-related cataract, bilateral: Secondary | ICD-10-CM

## 2020-08-24 DIAGNOSIS — H35033 Hypertensive retinopathy, bilateral: Secondary | ICD-10-CM

## 2020-08-27 ENCOUNTER — Ambulatory Visit (HOSPITAL_COMMUNITY)
Admission: RE | Admit: 2020-08-27 | Discharge: 2020-08-27 | Disposition: A | Discharge status: Home / Self Care | Payer: 59 | Source: Ambulatory Visit | Attending: Adult Health | Admitting: Adult Health

## 2020-08-27 ENCOUNTER — Telehealth (HOSPITAL_COMMUNITY): Payer: Self-pay | Admitting: Pharmacy Technician

## 2020-08-27 ENCOUNTER — Other Ambulatory Visit: Payer: Self-pay

## 2020-08-27 ENCOUNTER — Encounter (HOSPITAL_COMMUNITY): Payer: Self-pay

## 2020-08-27 ENCOUNTER — Encounter (INDEPENDENT_AMBULATORY_CARE_PROVIDER_SITE_OTHER): Payer: 59 | Admitting: Ophthalmology

## 2020-08-27 ENCOUNTER — Other Ambulatory Visit (HOSPITAL_COMMUNITY): Payer: 59

## 2020-08-27 VITALS — BP 172/110 | HR 75 | Wt 188.0 lb

## 2020-08-27 DIAGNOSIS — E1136 Type 2 diabetes mellitus with diabetic cataract: Secondary | ICD-10-CM | POA: Diagnosis not present

## 2020-08-27 DIAGNOSIS — Z7982 Long term (current) use of aspirin: Secondary | ICD-10-CM | POA: Insufficient documentation

## 2020-08-27 DIAGNOSIS — Z7984 Long term (current) use of oral hypoglycemic drugs: Secondary | ICD-10-CM | POA: Insufficient documentation

## 2020-08-27 DIAGNOSIS — Z79899 Other long term (current) drug therapy: Secondary | ICD-10-CM | POA: Insufficient documentation

## 2020-08-27 DIAGNOSIS — Z713 Dietary counseling and surveillance: Secondary | ICD-10-CM | POA: Insufficient documentation

## 2020-08-27 DIAGNOSIS — I5022 Chronic systolic (congestive) heart failure: Secondary | ICD-10-CM | POA: Insufficient documentation

## 2020-08-27 DIAGNOSIS — H5712 Ocular pain, left eye: Secondary | ICD-10-CM | POA: Diagnosis not present

## 2020-08-27 DIAGNOSIS — I2510 Atherosclerotic heart disease of native coronary artery without angina pectoris: Secondary | ICD-10-CM

## 2020-08-27 DIAGNOSIS — I10 Essential (primary) hypertension: Secondary | ICD-10-CM | POA: Diagnosis not present

## 2020-08-27 DIAGNOSIS — F17210 Nicotine dependence, cigarettes, uncomplicated: Secondary | ICD-10-CM | POA: Insufficient documentation

## 2020-08-27 DIAGNOSIS — Z720 Tobacco use: Secondary | ICD-10-CM | POA: Diagnosis not present

## 2020-08-27 DIAGNOSIS — I110 Hypertensive heart disease with heart failure: Secondary | ICD-10-CM | POA: Insufficient documentation

## 2020-08-27 DIAGNOSIS — Z8249 Family history of ischemic heart disease and other diseases of the circulatory system: Secondary | ICD-10-CM | POA: Insufficient documentation

## 2020-08-27 DIAGNOSIS — E785 Hyperlipidemia, unspecified: Secondary | ICD-10-CM | POA: Diagnosis not present

## 2020-08-27 LAB — BASIC METABOLIC PANEL
Anion gap: 8 (ref 5–15)
BUN: 15 mg/dL (ref 6–20)
CO2: 32 mmol/L (ref 22–32)
Calcium: 9.9 mg/dL (ref 8.9–10.3)
Chloride: 99 mmol/L (ref 98–111)
Creatinine, Ser: 0.93 mg/dL (ref 0.44–1.00)
GFR, Estimated: >60 mL/min (ref >60)
Glucose, Bld: 162 mg/dL — ABNORMAL HIGH (ref 70–99)
Potassium: 4.3 mmol/L (ref 3.5–5.1)
Sodium: 139 mmol/L (ref 135–145)

## 2020-08-27 LAB — DIGOXIN LEVEL: Digoxin Level: 0.6 ng/mL — ABNORMAL LOW (ref 0.8–2.0)

## 2020-08-27 MED ORDER — EMPAGLIFLOZIN 25 MG PO TABS: 25.0000 mg | ORAL_TABLET | Freq: Every day | ORAL | 3 refills | Status: DC

## 2020-08-27 MED ORDER — ENTRESTO 97-103 MG PO TABS: 1.0000 | ORAL_TABLET | Freq: Two times a day (BID) | ORAL | 11 refills | Status: DC

## 2020-08-27 NOTE — Telephone Encounter (Signed)
Received a message that the patient needed help affording Jardiance. The patient already has an activated co-pay card, her co-pay for 90 days should be $10.  Called and spoke with the patient. Provided her the billing information. Sent 90 day request to Philicia (CMA), to send to the patient's pharmacy.  Archer Asa, CPhT

## 2020-08-27 NOTE — Addendum Note (Signed)
Encounter addended by: Chinita Pester, CMA on: 08/27/2020 2:01 PM  Actions taken: Order list changed

## 2020-08-27 NOTE — Progress Notes (Signed)
[facility] Note    PCP: Dema Severin, NP  Cardiologist: Dr. Eldridge Dace HF: Dr. Gala Romney   HPI: Shelly [name] is a 50 year old woman with DM2, tobacco abuse, CAD and systolic HF diagnosed in 5/21 with EF 20%  Admitted 5/21 with acute HF. Echo EF 20% with severe RV dysfunction. R/LHC showed diffuse non-obstructive CAD with chronic dissection/recannalization of mid LAD. Suspect she had an out of hospital MI at some point.  There were no interventional targets.  Outpatient cMRI  8/21 1.  Mild LV dilatation with severe systolic dysfunction (LVEF 30%) 2. Subendocardial LGE c/w prior LAD and RCA infarcts. >50% transmurality of LGE suggesting nonviability in basal to mid inferior walls, apical anterior/septal walls, and apex. <50% transmurality of LGE suggesting viability in mid anterior wall. 3.  Normal RV size and systolic function (EF 66%)  HF visit 10/21  we were unable to further titrate meds due to soft BP.    S/P Repair of  Left retinal detachement 08/23/20  Today she returns for HF follow up.Overall feeling fair. Complaining of left eye pain from recent surgery.  Denies SOB/PND/Orthopnea. Appetite ok. No fever or chills.  Unable to weigh due to vision issues. Taking all medications. Having trouble paying for medications. Smoking about a pack a day of cigarettes. Requires assistance with transportation.   Echo 5/21: EF 20% with severe RV dysfunction.  RHC 5/21 Ao = 106/75 (87) LV = 107/27 RA = 15 RV = 54/16 PA = 57/25 (40) PCW = 27 (v = 37) Fick cardiac output/index = 4.4/2.0 PVR = 1.4 WU SVR = 1297 Ao sat = 99% PA sat = 62%, 63%   Past Medical History:  Diagnosis Date  . Anemia   . Anxiety   . Arthritis   . Cataract    Mixed OU  . CHF (congestive heart failure) (HCC)   . Coronary artery disease   . Diabetes mellitus without complication (HCC)   . Diabetic retinopathy (HCC)    PDR OU  . Headache    Cluster headaches in the past  . History of  kidney stones    "2 sitting"  . Hypertension   . Hypertensive retinopathy    OU  . Myocardial infarction (HCC)   . Pneumonia   . Tobacco abuse     Current Outpatient Medications  Medication Sig Dispense Refill  . aspirin EC 81 MG tablet Take 1 tablet (81 mg total) by mouth daily. 150 tablet 2  . atorvastatin (LIPITOR) 80 MG tablet Take 1 tablet (80 mg total) by mouth daily. 30 tablet 11  . carvedilol (COREG) 3.125 MG tablet Take 1 tablet (3.125 mg total) by mouth 2 (two) times daily. 60 tablet 11  . digoxin (LANOXIN) 0.125 MG tablet Take 1 tablet (125 mcg total) by mouth daily. 90 tablet 3  . Dulaglutide (TRULICITY) 1.5 MG/0.5ML SOPN Inject 1.5 mg into the skin once a week.    . empagliflozin (JARDIANCE) 25 MG TABS tablet Take 1 tablet (25 mg total) by mouth daily. 30 tablet 11  . furosemide (LASIX) 20 MG tablet Take 20 mg by mouth daily.    . hydrOXYzine (ATARAX/VISTARIL) 25 MG tablet Take 25 mg by mouth 2 (two) times daily as needed for anxiety (sleep).    . metFORMIN (GLUCOPHAGE) 1000 MG tablet Take 1,000 mg by mouth in the morning and at bedtime.    . potassium chloride (KLOR-CON) 10 MEQ tablet Take 4 tablets (40 mEq total) by mouth daily. (  Patient taking differently: Take 20 mEq by mouth daily.) 120 tablet 11  . sacubitril-valsartan (ENTRESTO) 49-51 MG Take 1 tablet by mouth 2 (two) times daily. 60 tablet 6  . spironolactone (ALDACTONE) 25 MG tablet Take 1 tablet (25 mg total) by mouth daily. 90 tablet 3  . vitamin B-12 (CYANOCOBALAMIN) 1000 MCG tablet Take 1,000 mcg by mouth daily.     No current facility-administered medications for this encounter.    No Known Allergies    Social History   Socioeconomic History  . Marital status: Married    Spouse name: Not on file  . Number of children: Not on file  . Years of education: Not on file  . Highest education level: Not on file  Occupational History  . Not on file  Tobacco Use  . Smoking status: Current Every Day Smoker     Packs/day: 1.50  . Smokeless tobacco: Never Used  Vaping Use  . Vaping Use: Never used  Substance and Sexual Activity  . Alcohol use: Not Currently  . Drug use: Never  . Sexual activity: Not on file  Other Topics Concern  . Not on file  Social History Narrative  . Not on file   Social Determinants of Health   Financial Resource Strain: Not on file  Food Insecurity: Not on file  Transportation Needs: Not on file  Physical Activity: Not on file  Stress: Not on file  Social Connections: Not on file  Intimate Partner Violence: Not on file      Family History  Problem Relation Age of Onset  . Sudden Cardiac Death Mother   . CAD Father   . CAD Paternal Grandmother   . CAD Paternal Grandfather     Vitals:   08/27/20 1211  BP: (!) 172/110  Pulse: 75  SpO2: 97%  Weight: 85.3 kg   Wt Readings from Last 3 Encounters:  08/27/20 85.3 kg  08/23/20 83.9 kg  08/20/20 84.7 kg      PHYSICAL EXAM: General:  Appears tired.  No resp difficulty HEENT: normal Neck: supple. no JVD. Carotids 2+ bilat; no bruits. No lymphadenopathy or thryomegaly appreciated. Cor: PMI nondisplaced. Regular rate & rhythm. No rubs, gallops or murmurs. Lungs: clear Abdomen: soft, nontender, nondistended. No hepatosplenomegaly. No bruits or masses. Good bowel sounds. Extremities: no cyanosis, clubbing, rash, edema Neuro: alert & orientedx3, cranial nerves grossly intact. moves all 4 extremities w/o difficulty. Affect pleasant   ASSESSMENT & PLAN: 1. 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 R/LHC showed diffuse non-obstructive CAD with chronic dissection/recannalization of mid LAD. Suspect she had an out of hospital MI at some point.  There were no interventional targets. - cMRI 8/21: LVEF 30% priro LAD & RCA infarcts. RV ok  - NYHA II. Volume status stable. Does not require diuretics.  - Increase entresto 97-103 mg twice a day. Given samples  today. Check BMET today and in 7 days.  - continue Spiro 25 mg daily - Continue Coreg 3.125 mg bid - Continue digoxin 0.125 mg daily. Check digoxin level.  - Continue Jardiance 25 mg daily.  -Refer to EP for ICD if EF remains < 35%.  - Repeat ECHO next visit.   2. CAD: - LHC 5/21 showed diffuse non-obstructive CAD with chronic dissection/recannalization of mid LAD. Suspect she had an out of hospital MI at some point.  There were no interventional targets. - cMRI as above c/w prior infarcts in LAD/RCA territories -  No chest pain.  - continue ASA/statin and  blocker - complete smoking cessation strongly advised.   3. Type 2 DM: management per PCP - continue Metformin - continue Jardiance  4. Tobacco Abuse: -Discussed smoking cessation.   5. HLD:  - LDL goal <70 (98 on recent FLP 6/21).  - Continue atorvastatin 80 qhs.  6. HTN  -Increase entresto as noted above.  - Check BP next week at her lab appointment.    -Follow up in 8 weeks with Dr Gala Romney and an ECHO.  - Check BMET today and in 7 days.  - Give entresto samples 49-51 mg twice a day given. - She will received entresto 97-103 mg twice a day in the mail.      Tonye Becket, NP 08/27/20

## 2020-08-27 NOTE — Patient Instructions (Addendum)
Labs done today. We will contact you only if your labs are abnormal.  INCREASE Entresto to 97-103mg  (1 tablet) by mouth 2 times daily.   No other medication changes were made. Please continue all current medications as prescribed.  Your physician recommends that you schedule a follow-up appointment next week for a lab & Blood pressure check only appointment and in 6-8 weeks for an appointment with Dr. Gala Romney with an echo prior to exam.  Your physician has requested that you have an echocardiogram. Echocardiography is a painless test that uses sound waves to create images of your heart. It provides your doctor with information about the size and shape of your heart and how well your heart's chambers and valves are working. This procedure takes approximately one hour. There are no restrictions for this procedure.   If you have any questions or concerns before your next appointment please send [place] a message through [place] or call our office at [phone].    TO LEAVE A MESSAGE FOR THE NURSE SELECT OPTION 2, PLEASE LEAVE A MESSAGE INCLUDING: . YOUR NAME . DATE OF BIRTH . CALL BACK NUMBER . REASON FOR CALL**this is important as we prioritize the call backs  YOU WILL RECEIVE A CALL BACK THE SAME DAY AS LONG AS YOU CALL BEFORE 4:00 PM   Do the following things EVERYDAY: 1) Weigh yourself in the morning before breakfast. Write it down and keep it in a log. 2) Take your medicines as prescribed 3) Eat low salt foods--Limit salt (sodium) to 2000 mg per day.  4) Stay as active as you can everyday 5) Limit all fluids for the day to less than 2 liters   At the [facility], you and your health needs are our priority. As part of our continuing mission to provide you with exceptional heart care, we have created designated Provider Care Teams. These Care Teams include your primary Cardiologist (physician) and Advanced Practice Providers (APPs- Physician Assistants and Nurse  Practitioners) who all work together to provide you with the care you need, when you need it.   You may see any of the following providers on your designated Care Team at your next follow up: Marland Kitchen Dr Arvilla Meres . Dr Marca Ancona . Tonye Becket, NP . Robbie Lis, PA . Karle Plumber, PharmD   Please be sure to bring in all your medications bottles to every appointment.

## 2020-08-27 NOTE — Progress Notes (Signed)
Samples of this drug were given to the patient--Entresto 49/51mg -28 tablets.  We do not have samples for the Entresto 97/103mg  tablets.  She receives medication directly from the company and we will send the new prescription to company for updated shipment.

## 2020-08-29 NOTE — Progress Notes (Signed)
[facility] - Clinic Note  08/30/2020     CHIEF COMPLAINT Patient presents for Post-op Follow-up   HISTORY OF PRESENT ILLNESS: Shelly [name] is a 50 y.o. female who presents to the clinic today for:  HPI    Post-op Follow-up    In left eye.  Discomfort includes pain.  Vision is improved, is blurred at distance and is blurred at near.  I, the attending physician,  performed the HPI with the patient and updated documentation appropriately.          Comments    50 y/o female pt here for 1 wk po s/p PPV OS 3.17.22.  VA OS slightly improved.  No change in [place] OD.  OS is a little sore; eases w/use of otc pain meds.  Denies FOL, floaters.  BS 127 yesterday.  A1C 8.2.  PF QID OS Zymaxid QID OS Atropine BID OS Brimonidine BID OS PSO ung QID OS       Last edited by [name], MD on 08/30/2020  8:21 AM. (History)    Pt states eye is feeling better and she is remaining face down as much as possible  Referring physician: Conley Rolls [place], OD [address] [place],  [place] [address]  HISTORICAL INFORMATION:   Selected notes from the MEDICAL RECORD NUMBER Referred by Dr. Aletta Edouard for concern of decreased VA OU   CURRENT MEDICATIONS: Current Outpatient Medications (Ophthalmic Drugs)  Medication Sig  . bacitracin-polymyxin b (POLYSPORIN) ophthalmic ointment Place into the right eye 2 (two) times daily for 10 days. Place a 1/2 inch ribbon of ointment into the lower eyelid at bedtime and as needed  . prednisoLONE acetate (PRED FORTE) 1 % ophthalmic suspension Place 1 drop into the left eye 4 (four) times daily.   No current facility-administered medications for this visit. (Ophthalmic Drugs)   Current Outpatient Medications (Other)  Medication Sig  . aspirin EC 81 MG tablet Take 1 tablet (81 mg total) by mouth daily.  Marland Kitchen atorvastatin (LIPITOR) 80 MG tablet Take 1 tablet (80 mg total) by mouth daily.  . carvedilol (COREG) 3.125 MG tablet Take 1 tablet (3.125 mg total)  by mouth 2 (two) times daily.  . digoxin (LANOXIN) 0.125 MG tablet Take 1 tablet (125 mcg total) by mouth daily.  . Dulaglutide (TRULICITY) 1.5 MG/0.5ML SOPN Inject 1.5 mg into the skin once a week.  . empagliflozin (JARDIANCE) 25 MG TABS tablet Take 1 tablet (25 mg total) by mouth daily.  . furosemide (LASIX) 20 MG tablet Take 20 mg by mouth daily.  . hydrOXYzine (ATARAX/VISTARIL) 25 MG tablet Take 25 mg by mouth 2 (two) times daily as needed for anxiety (sleep).  . metFORMIN (GLUCOPHAGE) 1000 MG tablet Take 1,000 mg by mouth in the morning and at bedtime.  . potassium chloride (KLOR-CON) 10 MEQ tablet Take 4 tablets (40 mEq total) by mouth daily. (Patient taking differently: Take 20 mEq by mouth daily.)  . sacubitril-valsartan (ENTRESTO) 97-103 MG Take 1 tablet by mouth 2 (two) times daily.  Marland Kitchen spironolactone (ALDACTONE) 25 MG tablet Take 1 tablet (25 mg total) by mouth daily.  . vitamin B-12 (CYANOCOBALAMIN) 1000 MCG tablet Take 1,000 mcg by mouth daily.   No current facility-administered medications for this visit. (Other)      REVIEW OF SYSTEMS: ROS    Positive for: Endocrine, Cardiovascular, Eyes   Negative for: Constitutional, Gastrointestinal, Neurological, Skin, Genitourinary, Musculoskeletal, HENT, Respiratory, Psychiatric, Allergic/Imm, Heme/Lymph   Last edited by Lenord Fellers,  Jerrye Noble, COA on 08/30/2020  7:54 AM. (History)       ALLERGIES No Known Allergies  PAST MEDICAL HISTORY Past Medical History:  Diagnosis Date  . Anemia   . Anxiety   . Arthritis   . Cataract    Mixed OU  . CHF (congestive heart failure) (HCC)   . Coronary artery disease   . Diabetes mellitus without complication (HCC)   . Diabetic retinopathy (HCC)    PDR OU  . Headache    Cluster headaches in the past  . History of kidney stones    "2 sitting"  . Hypertension   . Hypertensive retinopathy    OU  . Myocardial infarction (HCC)   . Pneumonia   . Tobacco abuse    Past Surgical History:   Procedure Laterality Date  . EYE SURGERY Left 08/23/2020   PPV - Dr. [name]  . INJECTION OF SILICONE OIL Left 08/23/2020   Procedure: INJECTION OF SILICONE OIL;  Surgeon: [name], MD;  Location: [facility] OR;  Service: Ophthalmology;  Laterality: Left;  . MEMBRANE PEEL Left 08/23/2020   Procedure: MEMBRANE PEEL;  Surgeon: [name], MD;  Location[facility] OR;  Service: Ophthalmology;  Laterality: Left;  . PARS PLANA VITRECTOMY Left 08/23/2020   Procedure: PARS PLANA VITRECTOMY WITH 25 GAUGE WITH ENDOLASER;  Surgeon: [name], MD;  Location: [facility] OR;  Service: Ophthalmology;  Laterality: Left;  . 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/23/2020   Procedure: REPAIR OF COMPLEX TRACTION RETINAL DETACHMENT;  Surgeon: [name], MD;  Location: [facility] OR;  Service: Ophthalmology;  Laterality: Left;  . RIGHT/LEFT HEART CATH AND CORONARY ANGIOGRAPHY N/A 10/14/2019   Procedure: RIGHT/LEFT HEART CATH AND CORONARY ANGIOGRAPHY;  Surgeon: Dolores Patty, MD;  Location: [facility] INVASIVE CV LAB;  Service: Cardiovascular;  Laterality: N/A;    FAMILY HISTORY Family History  Problem Relation Age of Onset  . Sudden Cardiac Death Mother   . CAD Father   . CAD Paternal Grandmother   . CAD Paternal Grandfather     SOCIAL HISTORY Social History   Tobacco Use  . Smoking status: Current Every Day Smoker    Packs/day: 1.50  . Smokeless tobacco: Never Used  Vaping Use  . Vaping Use: Never used  Substance Use Topics  . Alcohol use: Not Currently  . Drug use: Never         OPHTHALMIC EXAM:  Base Eye Exam    Visual Acuity (Snellen - Linear)      Right Left   Dist [place] Def CF @ face   Dist ph [place]  NI       Tonometry (Tonopen, 7:58 AM)      Right Left   Pressure Def 17       Pupils      Dark Light Shape React APD   Right 4 3 Round Minimal None   Left 4 4 Round Minimal None  OS pharm dil       Visual Fields (Counting fingers)      Left Right   Restrictions Partial outer  superior temporal, inferior temporal, superior nasal, inferior nasal deficiencies Partial inner superior temporal, inferior temporal, superior nasal deficiencies       Extraocular Movement      Right Left    Full, Ortho Full, Ortho       Neuro/Psych    Oriented x3: Yes   Mood/Affect: Normal       Dilation    Left eye: 1.0%  Mydriacyl, 2.5% Phenylephrine @ 7:58 AM        Slit Lamp and Fundus Exam    Slit Lamp Exam      Right Left   Lids/Lashes Dermatochalasis - upper lid, Meibomian gland dysfunction Dermatochalasis - upper lid, Meibomian gland dysfunction   Conjunctiva/Sclera White and quiet Mild Subconjunctival hemorrhage - improving   Cornea 2-3+ fine Punctate epithelial erosions Epithelial defect closed, 2-3+ Punctate epithelial erosions, trace Descemet's folds   Anterior Chamber deep, clear, narrow angles deep, clear, narrow angles   Iris Round and dilated, No NVI Round and dilated, No NVI   Lens 1-2+ Nuclear sclerosis, 2+ Cortical cataract 2+ Nuclear sclerosis, 2+ Cortical cataract, trace Posterior subcapsular cataract   Vitreous blood stained vitreous condensations post vitrectomy, good silicone oil fill       Fundus Exam      Right Left   Disc  Pink and Sharp   C/D Ratio  0.2   Macula  Flat under oil, scattered DBH, fibrosis improved   Vessels  Hazy view, Severe attenuation, fibrosis improved   Periphery  attached, fibrosis improved, 360 PRP; ORIGINALLY: Extensive fibrosis, +TRD, boat shaped subhyaloid heme inferiorly          IMAGING AND PROCEDURES  Imaging and Procedures for 08/30/2020           ASSESSMENT/PLAN:    ICD-10-CM   1. Proliferative diabetic retinopathy of both eyes with macular edema associated with type 2 diabetes mellitus (HCC)  E42.3536   2. Retinal edema  H35.81   3. 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 Essential hypertension  I10   5. Hypertensive retinopathy of both eyes  H35.033   6. Combined forms of age-related  cataract of both eyes  H25.813     1-3. Severe proliferative diabetic retinopathy with edema OU  - FA (02.04.22) shows florid NV with leakage and pooling OU (OS > OD)   - OD: severe tractional fibrosis, edema, and TRD  - OS:  With TRD and subhyaloid heme             - s/p IVA OD #1 (02.08.22), #2 (03.08.22)  - s/p IVE OS #1 (02.04.22) -- sample, #2 (03.08.22) -- sample  - s/p PRP OD (02.04.22)             - s/p PRP OS (2.8.22)  - exam shows regression of NV but consolidation of fibrosis and TRD OU - OCT shows severe tractional fibrosis and edema OU, now TRD OU - discussed findings and severity of disease, guarded prognosis and extensive treatments and follow ups need to stabilize eyes and improve vision -- will likely need surgery OU  - now POW1 s/p PPV/POC/EL/FAX/SO + IVA OS, 03.17.2022             - doing well this morning             - fibrosis improved             - IOP good at 17             - cont   PF 4x/day OS                         zymaxid QID OS -- stop on Monday (03.28.22)                         Atropine BID OS -- use until bottle runs  out                         Brimonidine BID OS                         PSO ung QID OS              - cont face down positioning x3 days; avoid laying flat on back              - eye shield when sleeping x1 more week             - post op drop and positioning instructions reviewed              - tylenol/ibuprofen for pain  - f/u 2 weeks -- POV, OCT  4,5. Hypertensive retinopathy OU - discussed importance of tight BP control - monitor  6. Mixed Cataract OU - The symptoms of cataract, surgical options, and treatments and risks were discussed with patient. - discussed diagnosis and progression - not yet visually significant - monitor for now    Ophthalmic Meds Ordered this visit:  Meds ordered this encounter  Medications  . bacitracin-polymyxin b (POLYSPORIN) ophthalmic ointment    Sig: Place into the right eye 2 (two) times daily  for 10 days. Place a 1/2 inch ribbon of ointment into the lower eyelid at bedtime and as needed    Dispense:  3.5 g    Refill:  3  . prednisoLONE acetate (PRED FORTE) 1 % ophthalmic suspension    Sig: Place 1 drop into the left eye 4 (four) times daily.    Dispense:  15 mL    Refill:  0       Return in about 2 weeks (around 09/13/2020) for f/u PDR, POV OS -- OCT.  There are no Patient Instructions on file for this visit.  This document serves as a record of services personally performed by Karie Chimera, MD, PhD. It was created on their behalf by Cristopher Estimable, COT an ophthalmic technician. The creation of this record is the provider's dictation and/or activities during the visit.    Electronically signed by: Cristopher Estimable, COT 3.23.22 @ 9:23 AM   This document serves as a record of services personally performed by Karie Chimera, MD, PhD. It was created on their behalf by Glee Arvin. Manson Passey, OA an ophthalmic technician. The creation of this record is the provider's dictation and/or activities during the visit.    Electronically signed by: Glee Arvin. Manson Passey, [place] 03.24.2022 9:23 AM  Karie Chimera, M.D., Ph.D. Diseases & Surgery of the Retina and Vitreous [facility] 08/30/2020   I have reviewed the above documentation for accuracy and completeness, and I agree with the above. Karie Chimera, M.D., Ph.D. 08/31/20 9:23 AM  Abbreviations: M myopia (nearsighted); A astigmatism; H hyperopia (farsighted); P presbyopia; Mrx spectacle prescription;  CTL contact lenses; OD right eye; OS left eye; OU both eyes  XT exotropia; ET esotropia; PEK punctate epithelial keratitis; PEE punctate epithelial erosions; DES dry eye syndrome; MGD meibomian gland dysfunction; ATs artificial tears; PFAT's preservative free artificial tears; NSC nuclear sclerotic cataract; PSC posterior subcapsular cataract; ERM epi-retinal membrane; PVD posterior vitreous detachment; RD retinal detachment; DM  diabetes mellitus; DR diabetic retinopathy; NPDR non-proliferative diabetic retinopathy; PDR proliferative diabetic retinopathy; CSME clinically significant macular edema; DME diabetic macular edema; dbh dot blot hemorrhages; CWS cotton  wool spot; POAG primary open angle glaucoma; C/D cup-to-disc ratio; HVF humphrey visual field; GVF goldmann visual field; OCT optical coherence tomography; IOP intraocular pressure; BRVO Branch retinal vein occlusion; CRVO central retinal vein occlusion; CRAO central retinal artery occlusion; BRAO branch retinal artery occlusion; RT retinal tear; SB scleral buckle; PPV pars plana vitrectomy; VH Vitreous hemorrhage; PRP panretinal laser photocoagulation; IVK intravitreal kenalog; VMT vitreomacular traction; MH Macular hole;  NVD neovascularization of the disc; NVE neovascularization elsewhere; AREDS age related eye disease study; ARMD age related macular degeneration; POAG primary open angle glaucoma; EBMD epithelial/anterior basement membrane dystrophy; ACIOL anterior chamber intraocular lens; IOL intraocular lens; PCIOL posterior chamber intraocular lens; Phaco/IOL phacoemulsification with intraocular lens placement; Garza-[place] II photorefractive keratectomy; LASIK laser assisted in situ keratomileusis; HTN hypertension; DM diabetes mellitus; COPD chronic obstructive pulmonary disease

## 2020-08-30 ENCOUNTER — Encounter (INDEPENDENT_AMBULATORY_CARE_PROVIDER_SITE_OTHER): Payer: Self-pay | Admitting: Ophthalmology

## 2020-08-30 ENCOUNTER — Other Ambulatory Visit: Payer: Self-pay

## 2020-08-30 ENCOUNTER — Ambulatory Visit (INDEPENDENT_AMBULATORY_CARE_PROVIDER_SITE_OTHER): Payer: 59 | Admitting: Ophthalmology

## 2020-08-30 ENCOUNTER — Telehealth (HOSPITAL_COMMUNITY): Payer: Self-pay | Admitting: Pharmacy Technician

## 2020-08-30 DIAGNOSIS — H3342 Traction detachment of retina, left eye: Secondary | ICD-10-CM

## 2020-08-30 DIAGNOSIS — H25813 Combined forms of age-related cataract, bilateral: Secondary | ICD-10-CM

## 2020-08-30 DIAGNOSIS — E113513 Type 2 diabetes mellitus with proliferative diabetic retinopathy with macular edema, bilateral: Secondary | ICD-10-CM

## 2020-08-30 DIAGNOSIS — I10 Essential (primary) hypertension: Secondary | ICD-10-CM

## 2020-08-30 DIAGNOSIS — H35033 Hypertensive retinopathy, bilateral: Secondary | ICD-10-CM

## 2020-08-30 DIAGNOSIS — H3581 Retinal edema: Secondary | ICD-10-CM

## 2020-08-30 MED ORDER — PREDNISOLONE ACETATE 1 % OP SUSP: 1.0000 [drp] | Freq: Four times a day (QID) | OPHTHALMIC | 0 refills | Status: DC

## 2020-08-30 MED ORDER — BACITRACIN-POLYMYXIN B 500-10000 UNIT/GM OP OINT: TOPICAL_OINTMENT | Freq: Two times a day (BID) | OPHTHALMIC | 3 refills | Status: AC

## 2020-08-30 NOTE — Telephone Encounter (Signed)
Received a message that the patient's Shelly [name] was still $200 despite her co-pay card. It looks like the wrong card was being processed. Called the patient's pharmacy and provided the billing information again. Also inquired into her Jardiance co-pay card, provided that information again as well.   Called and updated the patient, gave her the card information for both medications as well.  Archer Asa, CPhT

## 2020-09-03 ENCOUNTER — Other Ambulatory Visit: Payer: Self-pay

## 2020-09-03 ENCOUNTER — Ambulatory Visit (HOSPITAL_COMMUNITY)
Admission: RE | Admit: 2020-09-03 | Discharge: 2020-09-03 | Disposition: A | Discharge status: Home / Self Care | Payer: 59 | Source: Ambulatory Visit | Attending: Internal Medicine | Admitting: Internal Medicine

## 2020-09-03 DIAGNOSIS — I5022 Chronic systolic (congestive) heart failure: Secondary | ICD-10-CM | POA: Insufficient documentation

## 2020-09-03 LAB — BASIC METABOLIC PANEL
Anion gap: 9 (ref 5–15)
BUN: 11 mg/dL (ref 6–20)
CO2: 29 mmol/L (ref 22–32)
Calcium: 9.6 mg/dL (ref 8.9–10.3)
Chloride: 100 mmol/L (ref 98–111)
Creatinine, Ser: 0.91 mg/dL (ref 0.44–1.00)
GFR, Estimated: >60 mL/min (ref >60)
Glucose, Bld: 129 mg/dL — ABNORMAL HIGH (ref 70–99)
Potassium: 3.9 mmol/L (ref 3.5–5.1)
Sodium: 138 mmol/L (ref 135–145)

## 2020-09-11 NOTE — Progress Notes (Shared)
[facility] - Clinic Note  09/13/2020     CHIEF COMPLAINT Patient presents for No chief complaint on file.   HISTORY OF PRESENT ILLNESS: Shelly [name] is a 50 y.o. female who presents to the clinic today for:   Referring physician: Dema Severin, NP [address] [place],  [place] [address]  HISTORICAL INFORMATION:   Selected notes from the MEDICAL RECORD NUMBER Referred by Dr. Aletta Edouard for concern of decreased VA OU   CURRENT MEDICATIONS: Current Outpatient Medications (Ophthalmic Drugs)  Medication Sig  . prednisoLONE acetate (PRED FORTE) 1 % ophthalmic suspension Place 1 drop into the left eye 4 (four) times daily.   No current facility-administered medications for this visit. (Ophthalmic Drugs)   Current Outpatient Medications (Other)  Medication Sig  . aspirin EC 81 MG tablet Take 1 tablet (81 mg total) by mouth daily.  Marland Kitchen atorvastatin (LIPITOR) 80 MG tablet Take 1 tablet (80 mg total) by mouth daily.  . carvedilol (COREG) 3.125 MG tablet Take 1 tablet (3.125 mg total) by mouth 2 (two) times daily.  . digoxin (LANOXIN) 0.125 MG tablet Take 1 tablet (125 mcg total) by mouth daily.  . Dulaglutide (TRULICITY) 1.5 MG/0.5ML SOPN Inject 1.5 mg into the skin once a week.  . empagliflozin (JARDIANCE) 25 MG TABS tablet Take 1 tablet (25 mg total) by mouth daily.  . furosemide (LASIX) 20 MG tablet Take 20 mg by mouth daily.  . hydrOXYzine (ATARAX/VISTARIL) 25 MG tablet Take 25 mg by mouth 2 (two) times daily as needed for anxiety (sleep).  . metFORMIN (GLUCOPHAGE) 1000 MG tablet Take 1,000 mg by mouth in the morning and at bedtime.  . potassium chloride (KLOR-CON) 10 MEQ tablet Take 4 tablets (40 mEq total) by mouth daily. (Patient taking differently: Take 20 mEq by mouth daily.)  . sacubitril-valsartan (ENTRESTO) 97-103 MG Take 1 tablet by mouth 2 (two) times daily.  Marland Kitchen spironolactone (ALDACTONE) 25 MG tablet Take 1 tablet (25 mg total) by mouth daily.  . vitamin B-12  (CYANOCOBALAMIN) 1000 MCG tablet Take 1,000 mcg by mouth daily.   No current facility-administered medications for this visit. (Other)      REVIEW OF SYSTEMS:    ALLERGIES No Known Allergies  PAST MEDICAL HISTORY Past Medical History:  Diagnosis Date  . Anemia   . Anxiety   . Arthritis   . Cataract    Mixed OU  . CHF (congestive heart failure) (HCC)   . Coronary artery disease   . Diabetes mellitus without complication (HCC)   . Diabetic retinopathy (HCC)    PDR OU  . Headache    Cluster headaches in the past  . History of kidney stones    "2 sitting"  . Hypertension   . Hypertensive retinopathy    OU  . Myocardial infarction (HCC)   . Pneumonia   . Tobacco abuse    Past Surgical History:  Procedure Laterality Date  . EYE SURGERY Left 08/23/2020   PPV - Dr. [name]  . INJECTION OF SILICONE OIL Left 08/23/2020   Procedure: INJECTION OF SILICONE OIL;  Surgeon: [name], MD;  Location: [facility] OR;  Service: Ophthalmology;  Laterality: Left;  . MEMBRANE PEEL Left 08/23/2020   Procedure: MEMBRANE PEEL;  Surgeon: [name], MD;  Location: [facility] OR;  Service: Ophthalmology;  Laterality: Left;  . PARS PLANA VITRECTOMY Left 08/23/2020   Procedure: PARS PLANA VITRECTOMY WITH 25 GAUGE WITH ENDOLASER;  Surgeon: [name], MD;  Location:  [facility] OR;  Service: Ophthalmology;  Laterality: Left;  . 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/23/2020   Procedure: REPAIR OF COMPLEX TRACTION RETINAL DETACHMENT;  Surgeon: [name], MD;  Location: [facility] OR;  Service: Ophthalmology;  Laterality: Left;  . RIGHT/LEFT HEART CATH AND CORONARY ANGIOGRAPHY N/A 10/14/2019   Procedure: RIGHT/LEFT HEART CATH AND CORONARY ANGIOGRAPHY;  Surgeon: Dolores Patty, MD;  Location: [facility] INVASIVE CV LAB;  Service: Cardiovascular;  Laterality: N/A;    FAMILY HISTORY Family History  Problem Relation Age of Onset  . Sudden Cardiac Death Mother   . CAD Father   . CAD Paternal Grandmother    . CAD Paternal Grandfather     SOCIAL HISTORY Social History   Tobacco Use  . Smoking status: Current Every Day Smoker    Packs/day: 1.50  . Smokeless tobacco: Never Used  Vaping Use  . Vaping Use: Never used  Substance Use Topics  . Alcohol use: Not Currently  . Drug use: Never         OPHTHALMIC EXAM:  Not recorded     IMAGING AND PROCEDURES  Imaging and Procedures for 09/13/2020           ASSESSMENT/PLAN:  No diagnosis found.  1-3. Severe proliferative diabetic retinopathy with edema OU  - FA (02.04.22) shows florid NV with leakage and pooling OU (OS > OD)   - OD: severe tractional fibrosis, edema, and TRD  - OS:  With TRD and subhyaloid heme             - s/p IVA OD #1 (02.08.22), #2 (03.08.22)  - s/p IVE OS #1 (02.04.22) -- sample, #2 (03.08.22) -- sample  - s/p PRP OD (02.04.22)             - s/p PRP OS (2.8.22)  - exam shows regression of NV but consolidation of fibrosis and TRD OU - OCT shows severe tractional fibrosis and edema OU, now TRD OU - discussed findings and severity of disease, guarded prognosis and extensive treatments and follow ups need to stabilize eyes and improve vision -- will likely need surgery OU  - now POW1 s/p PPV/POC/EL/FAX/SO + IVA OS, 03.17.2022             - doing well this morning             - fibrosis improved             - IOP good at 17             - cont   PF 4x/day OS                         zymaxid QID OS -- stop on Monday (03.28.22)                         Atropine BID OS -- use until bottle runs out                         Brimonidine BID OS                         PSO ung QID OS              - cont face down positioning x3 days; avoid laying flat on back              -  eye shield when sleeping x1 more week             - post op drop and positioning instructions reviewed              - tylenol/ibuprofen for pain  - f/u 2 weeks -- POV, OCT  4,5. Hypertensive retinopathy OU - discussed importance of tight BP  control - monitor  6. Mixed Cataract OU - The symptoms of cataract, surgical options, and treatments and risks were discussed with patient. - discussed diagnosis and progression - not yet visually significant - monitor for now    Ophthalmic Meds Ordered this visit:  No orders of the defined types were placed in this encounter.      No follow-ups on file.  There are no Patient Instructions on file for this visit.  This document serves as a record of services personally performed by Karie Chimera, MD, PhD. It was created on their behalf by Cristopher Estimable, COT an ophthalmic technician. The creation of this record is the provider's dictation and/or activities during the visit.    Electronically signed by: Cristopher Estimable, COT 4.5.22 @ 1:08 PM  Abbreviations: M myopia (nearsighted); A astigmatism; H hyperopia (farsighted); P presbyopia; Mrx spectacle prescription;  CTL contact lenses; OD right eye; OS left eye; OU both eyes  XT exotropia; ET esotropia; PEK punctate epithelial keratitis; PEE punctate epithelial erosions; DES dry eye syndrome; MGD meibomian gland dysfunction; ATs artificial tears; PFAT's preservative free artificial tears; NSC nuclear sclerotic cataract; PSC posterior subcapsular cataract; ERM epi-retinal membrane; PVD posterior vitreous detachment; RD retinal detachment; DM diabetes mellitus; DR diabetic retinopathy; NPDR non-proliferative diabetic retinopathy; PDR proliferative diabetic retinopathy; CSME clinically significant macular edema; DME diabetic macular edema; dbh dot blot hemorrhages; CWS cotton wool spot; POAG primary open angle glaucoma; C/D cup-to-disc ratio; HVF humphrey visual field; GVF goldmann visual field; OCT optical coherence tomography; IOP intraocular pressure; BRVO Branch retinal vein occlusion; CRVO central retinal vein occlusion; CRAO central retinal artery occlusion; BRAO branch retinal artery occlusion; RT retinal tear; SB scleral buckle; PPV pars  plana vitrectomy; VH Vitreous hemorrhage; PRP panretinal laser photocoagulation; IVK intravitreal kenalog; VMT vitreomacular traction; MH Macular hole;  NVD neovascularization of the disc; NVE neovascularization elsewhere; AREDS age related eye disease study; ARMD age related macular degeneration; POAG primary open angle glaucoma; EBMD epithelial/anterior basement membrane dystrophy; ACIOL anterior chamber intraocular lens; IOL intraocular lens; PCIOL posterior chamber intraocular lens; Phaco/IOL phacoemulsification with intraocular lens placement; PRK photorefractive keratectomy; LASIK laser assisted in situ keratomileusis; HTN hypertension; DM diabetes mellitus; COPD chronic obstructive pulmonary disease

## 2020-09-13 ENCOUNTER — Encounter (INDEPENDENT_AMBULATORY_CARE_PROVIDER_SITE_OTHER): Payer: 59 | Admitting: Ophthalmology

## 2020-09-17 NOTE — Progress Notes (Signed)
[facility] - Clinic Note  09/19/2020     CHIEF COMPLAINT Patient presents for Retina Follow Up   HISTORY OF PRESENT ILLNESS: Shelly [name] is a 50 y.o. female who presents to the clinic today for:  HPI    Retina Follow Up    Patient presents with  Diabetic Retinopathy.  In both eyes.  This started 2 weeks ago.  Since onset it is gradually improving.  I, the attending physician,  performed the HPI with the patient and updated documentation appropriately.          Comments    Pt here for 2 wk f/u PDR OU. Pt states she's starting to see OS certain things or colors, especially when outside. OD is still the same, just periphery vision. No pain or discomfort noted. Pt is concerned about walking on slanted ground, feels like equilibrium is off.        Last edited by [name], MD on 09/19/2020  1:33 PM. (History)    Pt states left eye is still blurry, but she can see objects, she was able to see colors out of it yesterday while riding in the car, she states right eye still has peripheral vision, but no central vision, she states she is ready for sx in the right eye  Referring physician: Conley Rolls My [place], OD [address] [place],  [place] [address]  HISTORICAL INFORMATION:   Selected notes from the MEDICAL RECORD NUMBER Referred by Dr. Aletta Edouard for concern of decreased VA OU   CURRENT MEDICATIONS: Current Outpatient Medications (Ophthalmic Drugs)  Medication Sig  . prednisoLONE acetate (PRED FORTE) 1 % ophthalmic suspension Place 1 drop into the left eye 4 (four) times daily.   No current facility-administered medications for this visit. (Ophthalmic Drugs)   Current Outpatient Medications (Other)  Medication Sig  . aspirin EC 81 MG tablet Take 1 tablet (81 mg total) by mouth daily.  Marland Kitchen atorvastatin (LIPITOR) 80 MG tablet Take 1 tablet (80 mg total) by mouth daily.  . carvedilol (COREG) 3.125 MG tablet Take 1 tablet (3.125 mg total) by mouth 2 (two) times daily.   . digoxin (LANOXIN) 0.125 MG tablet Take 1 tablet (125 mcg total) by mouth daily.  . Dulaglutide (TRULICITY) 1.5 MG/0.5ML SOPN Inject 1.5 mg into the skin once a week.  . empagliflozin (JARDIANCE) 25 MG TABS tablet Take 1 tablet (25 mg total) by mouth daily.  . furosemide (LASIX) 20 MG tablet Take 20 mg by mouth daily.  . hydrOXYzine (ATARAX/VISTARIL) 25 MG tablet Take 25 mg by mouth 2 (two) times daily as needed for anxiety (sleep).  . metFORMIN (GLUCOPHAGE) 1000 MG tablet Take 1,000 mg by mouth in the morning and at bedtime.  . potassium chloride (KLOR-CON) 10 MEQ tablet Take 4 tablets (40 mEq total) by mouth daily. (Patient taking differently: Take 20 mEq by mouth daily.)  . sacubitril-valsartan (ENTRESTO) 97-103 MG Take 1 tablet by mouth 2 (two) times daily.  Marland Kitchen spironolactone (ALDACTONE) 25 MG tablet Take 1 tablet (25 mg total) by mouth daily.  . vitamin B-12 (CYANOCOBALAMIN) 1000 MCG tablet Take 1,000 mcg by mouth daily.   No current facility-administered medications for this visit. (Other)      REVIEW OF SYSTEMS: ROS    Positive for: Endocrine, Cardiovascular, Eyes   Negative for: Constitutional, Gastrointestinal, Neurological, Skin, Genitourinary, Musculoskeletal, HENT, Respiratory, Psychiatric, Allergic/Imm, Heme/Lymph   Last edited by Thompson Grayer, COT on 09/19/2020 10:18 AM. (History)  ALLERGIES No Known Allergies  PAST MEDICAL HISTORY Past Medical History:  Diagnosis Date  . Anemia   . Anxiety   . Arthritis   . Cataract    Mixed OU  . CHF (congestive heart failure) (HCC)   . Coronary artery disease   . Diabetes mellitus without complication (HCC)   . Diabetic retinopathy (HCC)    PDR OU  . Headache    Cluster headaches in the past  . History of kidney stones    "2 sitting"  . Hypertension   . Hypertensive retinopathy    OU  . Myocardial infarction (HCC)   . Pneumonia   . Tobacco abuse    Past Surgical History:  Procedure Laterality Date  .  EYE SURGERY Left 08/23/2020   PPV - Dr. [name]  . INJECTION OF SILICONE OIL Left 08/23/2020   Procedure: INJECTION OF SILICONE OIL;  Surgeon: [name], MD;  Location: [facility] OR;  Service: Ophthalmology;  Laterality: Left;  . MEMBRANE PEEL Left 08/23/2020   Procedure: MEMBRANE PEEL;  Surgeon: [name], MD;  Location: [facility] OR;  Service: Ophthalmology;  Laterality: Left;  . PARS PLANA VITRECTOMY Left 08/23/2020   Procedure: PARS PLANA VITRECTOMY WITH 25 GAUGE WITH ENDOLASER;  Surgeon: [name], MD;  Location: [facility] OR;  Service: Ophthalmology;  Laterality: Left;  . 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/23/2020   Procedure: REPAIR OF COMPLEX TRACTION RETINAL DETACHMENT;  Surgeon: [name], MD;  Location[facility] OR;  Service: Ophthalmology;  Laterality: Left;  . RIGHT/LEFT HEART CATH AND CORONARY ANGIOGRAPHY N/A 10/14/2019   Procedure: RIGHT/LEFT HEART CATH AND CORONARY ANGIOGRAPHY;  Surgeon: Dolores Patty, MD;  Location: [facility] INVASIVE CV LAB;  Service: Cardiovascular;  Laterality: N/A;    FAMILY HISTORY Family History  Problem Relation Age of Onset  . Sudden Cardiac Death Mother   . CAD Father   . CAD Paternal Grandmother   . CAD Paternal Grandfather     SOCIAL HISTORY Social History   Tobacco Use  . Smoking status: Current Every Day Smoker    Packs/day: 1.50  . Smokeless tobacco: Never Used  Vaping Use  . Vaping Use: Never used  Substance Use Topics  . Alcohol use: Not Currently  . Drug use: Never         OPHTHALMIC EXAM:  Base Eye Exam    Visual Acuity (Snellen - Linear)      Right Left   Dist [place] CF at 3' CF at 3'       Tonometry (Tonopen, 10:25 AM)      Right Left   Pressure 18 21       Pupils      Dark Light Shape React APD   Right 5 4 Round Minimal None   Left 5 4 Round Minimal None       Visual Fields (Counting fingers)      Left Right   Restrictions Partial outer superior temporal, inferior temporal, inferior nasal  deficiencies Partial inner superior temporal, inferior temporal, superior nasal deficiencies       Extraocular Movement      Right Left    Full, Ortho Full, Ortho       Neuro/Psych    Oriented x3: Yes   Mood/Affect: Normal       Dilation    Left eye: 1.0% Mydriacyl, 2.5% Phenylephrine @ 10:25 AM        Slit Lamp and Fundus Exam    Slit Lamp Exam  Right Left   Lids/Lashes Dermatochalasis - upper lid, Meibomian gland dysfunction Dermatochalasis - upper lid, Meibomian gland dysfunction   Conjunctiva/Sclera White and quiet White and quiet, sutures dissolving, focal injection at suture sites   Cornea 2-3+ fine Punctate epithelial erosions 1+ inferior Punctate epithelial erosions, trace Descemet's folds   Anterior Chamber moderate depth, narrow angles deep, clear, narrow angles   Iris Round and reactive Round and dilated, No NVI   Lens 1-2+ Nuclear sclerosis, 2+ Cortical cataract 2+ Nuclear sclerosis, 2+ Cortical cataract, 1+Posterior subcapsular cataract   Vitreous blood stained vitreous condensations post vitrectomy, 90-95% silicone oil fill       Fundus Exam      Right Left   Disc  Pink and Sharp, Compact   C/D Ratio  0.2   Macula  Flat under oil, scattered DBH, fibrosis vastly improved, shallow residual SRF inferiorly   Vessels  attenuated, Tortuous   Periphery  attached, fibrosis improved, 360 PRP, scattered IRH; ORIGINALLY: Extensive fibrosis, +TRD, boat shaped subhyaloid heme inferiorly          IMAGING AND PROCEDURES  Imaging and Procedures for 09/19/2020  OCT, Retina - OU - Both Eyes       Right Eye Quality was good. Progression has been stable. Findings include abnormal foveal contour, intraretinal fluid, preretinal fibrosis, epiretinal membrane, macular pucker, vitreous traction, subretinal fluid (Severe tractional fibrosis and edema, interval progression of fibrosis and tractional detachment centrally).   Left Eye Quality was good. Central Foveal  Thickness: 757. Progression has improved. Findings include abnormal foveal contour, vitreous traction, subretinal fluid, macular pucker, epiretinal membrane, no IRF (Interval improvement in tractional fibrosis, residual SRF greatest inferiorly).   Notes *Images captured and stored on drive  Diagnosis / Impression:  OD: Severe tractional fibrosis and edema, interval progression of fibrosis and tractional detachment centrally OS: Interval improvement in tractional fibrosis, residual SRF greatest inferiorly  Clinical management:  See below  Abbreviations: NFP - Normal foveal profile. CME - cystoid macular edema. PED - pigment epithelial detachment. IRF - intraretinal fluid. SRF - subretinal fluid. EZ - ellipsoid zone. ERM - epiretinal membrane. ORA - outer retinal atrophy. ORT - outer retinal tubulation. SRHM - subretinal hyper-reflective material. IRHM - intraretinal hyper-reflective material                 ASSESSMENT/PLAN:    ICD-10-CM   1. Proliferative diabetic retinopathy of both eyes with macular edema associated with type 2 diabetes mellitus (HCC)  I78.6767   2. Retinal edema  H35.81 OCT, Retina - OU - Both Eyes  3. 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 Essential hypertension  I10   5. Hypertensive retinopathy of both eyes  H35.033   6. Combined forms of age-related cataract of both eyes  H25.813     1-3. Severe proliferative diabetic retinopathy with edema OU  - FA (02.04.22) shows florid NV with leakage and pooling OU (OS > OD)   - OD: severe tractional fibrosis, edema, and TRD  - OS:  With TRD and subhyaloid heme             - s/p IVA OD #1 (02.08.22), #2 (03.08.22)  - s/p IVE OS #1 (02.04.22) -- sample, #2 (03.08.22) -- sample  - s/p PRP OD (02.04.22)             - s/p PRP OS (2.8.22)  - exam shows regression of NV but consolidation of fibrosis and TRD OU - OCT shows severe tractional fibrosis and  edema OU, now TRD OU - discussed findings and severity  of disease, guarded prognosis and extensive treatments and follow ups need to stabilize eyes and improve vision -- will likely need surgery OU  - POW4 s/p PPV/POC/EL/FAX/SO + IVA OS, 03.17.2022             - doing well -- pt reports subjective improvement in vision OS             - fibrosis improved, but OCT shows shallow residual SRF inferiorly             - IOP 21 today             - cont   PF 4x/day OS                         Brimonidine BID OS                         PSO ung QID OS              - cont face down positioning 50% of time; avoid laying flat on back              - post op drop and positioning instructions reviewed   - pt wishes to move forward to with TRD repair OD  - will plan for 25g PPV w/ MP/EL/FAX/SO + IVA OD on Thursday, 4.28.22 - f/u April 25 -- for pre-op paperwork and likely injections   4,5. Hypertensive retinopathy OU - discussed importance of tight BP control - monitor  6. Mixed Cataract OU - The symptoms of cataract, surgical options, and treatments and risks were discussed with patient. - discussed diagnosis and progression - not yet visually significant - monitor for now  Ophthalmic Meds Ordered this visit:  No orders of the defined types were placed in this encounter.     Return in about 12 days (around 10/01/2020) for pre-op paper work and Dilated Exam, OCT, Possible Injxns.  There are no Patient Instructions on file for this visit.  This document serves as a record of services personally performed by Karie Chimera, MD, PhD. It was created on their behalf by Annalee Genta, COMT. The creation of this record is the provider's dictation and/or activities during the visit.  Electronically signed by: Annalee Genta, COMT 09/19/20 1:35 PM   This document serves as a record of services personally performed by Karie Chimera, MD, PhD. It was created on their behalf by Glee Arvin. Manson Passey, OA an ophthalmic technician. The creation of this record is the  provider's dictation and/or activities during the visit.    Electronically signed by: Glee Arvin. Manson Passey, [place] 04.13.2022 1:35 PM  Karie Chimera, M.D., Ph.D. Diseases & Surgery of the Retina and Vitreous [facility]  I have reviewed the above documentation for accuracy and completeness, and I agree with the above. Karie Chimera, M.D., Ph.D. 09/19/20 1:44 PM   Abbreviations: M myopia (nearsighted); A astigmatism; H hyperopia (farsighted); P presbyopia; Mrx spectacle prescription;  CTL contact lenses; OD right eye; OS left eye; OU both eyes  XT exotropia; ET esotropia; PEK punctate epithelial keratitis; PEE punctate epithelial erosions; DES dry eye syndrome; MGD meibomian gland dysfunction; ATs artificial tears; PFAT's preservative free artificial tears; NSC nuclear sclerotic cataract; PSC posterior subcapsular cataract; ERM epi-retinal membrane; PVD posterior vitreous detachment; RD retinal detachment; DM diabetes mellitus; DR diabetic  retinopathy; NPDR non-proliferative diabetic retinopathy; PDR proliferative diabetic retinopathy; CSME clinically significant macular edema; DME diabetic macular edema; dbh dot blot hemorrhages; CWS cotton wool spot; POAG primary open angle glaucoma; C/D cup-to-disc ratio; HVF humphrey visual field; GVF goldmann visual field; OCT optical coherence tomography; IOP intraocular pressure; BRVO Branch retinal vein occlusion; CRVO central retinal vein occlusion; CRAO central retinal artery occlusion; BRAO branch retinal artery occlusion; RT retinal tear; SB scleral buckle; PPV pars plana vitrectomy; VH Vitreous hemorrhage; PRP panretinal laser photocoagulation; IVK intravitreal kenalog; VMT vitreomacular traction; MH Macular hole;  NVD neovascularization of the disc; NVE neovascularization elsewhere; AREDS age related eye disease study; ARMD age related macular degeneration; POAG primary open angle glaucoma; EBMD epithelial/anterior basement membrane  dystrophy; ACIOL anterior chamber intraocular lens; IOL intraocular lens; PCIOL posterior chamber intraocular lens; Phaco/IOL phacoemulsification with intraocular lens placement; [place] photorefractive keratectomy; LASIK laser assisted in situ keratomileusis; HTN hypertension; DM diabetes mellitus; COPD chronic obstructive pulmonary disease

## 2020-09-19 ENCOUNTER — Other Ambulatory Visit: Payer: Self-pay

## 2020-09-19 ENCOUNTER — Encounter (INDEPENDENT_AMBULATORY_CARE_PROVIDER_SITE_OTHER): Payer: Self-pay | Admitting: Ophthalmology

## 2020-09-19 ENCOUNTER — Ambulatory Visit (INDEPENDENT_AMBULATORY_CARE_PROVIDER_SITE_OTHER): Payer: 59 | Admitting: Ophthalmology

## 2020-09-19 DIAGNOSIS — H3343 Traction detachment of retina, bilateral: Secondary | ICD-10-CM | POA: Diagnosis not present

## 2020-09-19 DIAGNOSIS — E113513 Type 2 diabetes mellitus with proliferative diabetic retinopathy with macular edema, bilateral: Secondary | ICD-10-CM

## 2020-09-19 DIAGNOSIS — H3342 Traction detachment of retina, left eye: Secondary | ICD-10-CM

## 2020-09-19 DIAGNOSIS — H3581 Retinal edema: Secondary | ICD-10-CM | POA: Diagnosis not present

## 2020-09-19 DIAGNOSIS — H35033 Hypertensive retinopathy, bilateral: Secondary | ICD-10-CM

## 2020-09-19 DIAGNOSIS — I10 Essential (primary) hypertension: Secondary | ICD-10-CM | POA: Diagnosis not present

## 2020-09-19 DIAGNOSIS — H25813 Combined forms of age-related cataract, bilateral: Secondary | ICD-10-CM

## 2020-10-01 ENCOUNTER — Other Ambulatory Visit: Payer: Self-pay

## 2020-10-01 ENCOUNTER — Ambulatory Visit (INDEPENDENT_AMBULATORY_CARE_PROVIDER_SITE_OTHER): Payer: 59 | Admitting: Ophthalmology

## 2020-10-01 ENCOUNTER — Other Ambulatory Visit (HOSPITAL_COMMUNITY)
Admission: RE | Admit: 2020-10-01 | Discharge: 2020-10-01 | Disposition: A | Discharge status: Home / Self Care | Payer: 59 | Source: Ambulatory Visit | Attending: Ophthalmology | Admitting: Ophthalmology

## 2020-10-01 ENCOUNTER — Encounter (INDEPENDENT_AMBULATORY_CARE_PROVIDER_SITE_OTHER): Payer: Self-pay | Admitting: Ophthalmology

## 2020-10-01 DIAGNOSIS — H3581 Retinal edema: Secondary | ICD-10-CM | POA: Diagnosis not present

## 2020-10-01 DIAGNOSIS — Z01812 Encounter for preprocedural laboratory examination: Secondary | ICD-10-CM | POA: Insufficient documentation

## 2020-10-01 DIAGNOSIS — E113513 Type 2 diabetes mellitus with proliferative diabetic retinopathy with macular edema, bilateral: Secondary | ICD-10-CM

## 2020-10-01 DIAGNOSIS — I10 Essential (primary) hypertension: Secondary | ICD-10-CM | POA: Diagnosis not present

## 2020-10-01 DIAGNOSIS — H3343 Traction detachment of retina, bilateral: Secondary | ICD-10-CM

## 2020-10-01 DIAGNOSIS — H25813 Combined forms of age-related cataract, bilateral: Secondary | ICD-10-CM

## 2020-10-01 DIAGNOSIS — H35033 Hypertensive retinopathy, bilateral: Secondary | ICD-10-CM

## 2020-10-01 DIAGNOSIS — H3342 Traction detachment of retina, left eye: Secondary | ICD-10-CM

## 2020-10-01 DIAGNOSIS — Z20822 Contact with and (suspected) exposure to covid-19: Secondary | ICD-10-CM | POA: Diagnosis not present

## 2020-10-01 NOTE — Progress Notes (Addendum)
[facility] - Clinic Note  10/01/2020     CHIEF COMPLAINT Patient presents for Retina Follow Up   HISTORY OF PRESENT ILLNESS: Shelly [name] is a 50 y.o. female who presents to the clinic today for:  HPI    Retina Follow Up    Patient presents with  Diabetic Retinopathy.  In both eyes.  This started days ago.  I, the attending physician,  performed the HPI with the patient and updated documentation appropriately.          Comments    Pt here for retinal follow up pre op paperwork and IVA OD in officer per Marchelle Folks. Pt states vision is about the same, no major changes. No ocular discomfort or pain noted.        Last edited by [name], MD on 10/01/2020 12:51 PM. (History)    Pt states no change in vision  Referring physician: Dema Severin, NP [address] [place],  [place] [address]  HISTORICAL INFORMATION:   Selected notes from the MEDICAL RECORD NUMBER Referred by Dr. Aletta Edouard for concern of decreased VA OU   CURRENT MEDICATIONS: Current Outpatient Medications (Ophthalmic Drugs)  Medication Sig  . prednisoLONE acetate (PRED FORTE) 1 % ophthalmic suspension Place 1 drop into the left eye 4 (four) times daily.   No current facility-administered medications for this visit. (Ophthalmic Drugs)   Current Outpatient Medications (Other)  Medication Sig  . aspirin EC 81 MG tablet Take 1 tablet (81 mg total) by mouth daily.  Marland Kitchen atorvastatin (LIPITOR) 80 MG tablet Take 1 tablet (80 mg total) by mouth daily.  . carvedilol (COREG) 3.125 MG tablet Take 1 tablet (3.125 mg total) by mouth 2 (two) times daily.  . digoxin (LANOXIN) 0.125 MG tablet Take 1 tablet (125 mcg total) by mouth daily.  . empagliflozin (JARDIANCE) 25 MG TABS tablet Take 1 tablet (25 mg total) by mouth daily.  . furosemide (LASIX) 20 MG tablet Take 20 mg by mouth daily.  . hydrOXYzine (ATARAX/VISTARIL) 25 MG tablet Take 25 mg by mouth 2 (two) times daily as needed for anxiety (sleep).  .  metFORMIN (GLUCOPHAGE) 1000 MG tablet Take 1,000 mg by mouth in the morning and at bedtime.  . potassium chloride (KLOR-CON) 10 MEQ tablet Take 4 tablets (40 mEq total) by mouth daily. (Patient taking differently: Take 20 mEq by mouth daily.)  . sacubitril-valsartan (ENTRESTO) 97-103 MG Take 1 tablet by mouth 2 (two) times daily.  Marland Kitchen spironolactone (ALDACTONE) 25 MG tablet Take 1 tablet (25 mg total) by mouth daily.  . TRULICITY 3 MG/0.5ML SOPN Inject 3 mg into the skin every Sunday.  . vitamin B-12 (CYANOCOBALAMIN) 1000 MCG tablet Take 1,000 mcg by mouth daily.   No current facility-administered medications for this visit. (Other)      REVIEW OF SYSTEMS: ROS    Positive for: Endocrine, Cardiovascular, Eyes   Negative for: Constitutional, Gastrointestinal, Neurological, Skin, Genitourinary, Musculoskeletal, HENT, Respiratory, Psychiatric, Allergic/Imm, Heme/Lymph   Last edited by Thompson Grayer, COT on 10/01/2020  8:36 AM. (History)       ALLERGIES No Known Allergies  PAST MEDICAL HISTORY Past Medical History:  Diagnosis Date  . Anemia   . Anxiety   . Arthritis   . Cataract    Mixed OU  . CHF (congestive heart failure) (HCC)   . Coronary artery disease   . Diabetes mellitus without complication (HCC)   . Diabetic retinopathy (HCC)    PDR OU  .  Headache    Cluster headaches in the past  . History of kidney stones    "2 sitting"  . Hypertension   . Hypertensive retinopathy    OU  . Myocardial infarction (HCC)   . Pneumonia   . Tobacco abuse    Past Surgical History:  Procedure Laterality Date  . EYE SURGERY Left 08/23/2020   PPV - Dr. [name]  . INJECTION OF SILICONE OIL Left 08/23/2020   Procedure: INJECTION OF SILICONE OIL;  Surgeon: [name], MD;  Location: [facility] OR;  Service: Ophthalmology;  Laterality: Left;  . MEMBRANE PEEL Left 08/23/2020   Procedure: MEMBRANE PEEL;  Surgeon: [name], MD;  Location: [facility] OR;  Service: Ophthalmology;  Laterality:  Left;  . PARS PLANA VITRECTOMY Left 08/23/2020   Procedure: PARS PLANA VITRECTOMY WITH 25 GAUGE WITH ENDOLASER;  Surgeon: [name], MD;  Location: [facility] OR;  Service: Ophthalmology;  Laterality: Left;  . 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/23/2020   Procedure: REPAIR OF COMPLEX TRACTION RETINAL DETACHMENT;  Surgeon: [name], MD;  Location: [facility] OR;  Service: Ophthalmology;  Laterality: Left;  . RIGHT/LEFT HEART CATH AND CORONARY ANGIOGRAPHY N/A 10/14/2019   Procedure: RIGHT/LEFT HEART CATH AND CORONARY ANGIOGRAPHY;  Surgeon: Dolores Patty, MD;  Location: [facility] INVASIVE CV LAB;  Service: Cardiovascular;  Laterality: N/A;    FAMILY HISTORY Family History  Problem Relation Age of Onset  . Sudden Cardiac Death Mother   . CAD Father   . CAD Paternal Grandmother   . CAD Paternal Grandfather     SOCIAL HISTORY Social History   Tobacco Use  . Smoking status: Current Every Day Smoker    Packs/day: 1.50  . Smokeless tobacco: Never Used  Vaping Use  . Vaping Use: Never used  Substance Use Topics  . Alcohol use: Not Currently  . Drug use: Never         OPHTHALMIC EXAM:  Base Eye Exam    Visual Acuity (Snellen - Linear)      Right Left   Dist [place] 20/400 CF at 3'       Tonometry (Tonopen, 8:43 AM)      Right Left   Pressure 18 18       Pupils      Dark Light Shape React APD   Right 5 4 Round Minimal None   Left 5 4 Round Minimal None       Visual Fields      Left Right   Restrictions Partial outer superior temporal, inferior temporal, inferior nasal deficiencies Partial inner superior temporal, inferior temporal, superior nasal deficiencies       Extraocular Movement      Right Left    Full, Ortho Full, Ortho       Neuro/Psych    Oriented x3: Yes   Mood/Affect: Normal       Dilation    Both eyes: 1.0% Mydriacyl, 2.5% Phenylephrine @ 8:43 AM        Slit Lamp and Fundus Exam    Slit Lamp Exam      Right Left   Lids/Lashes  Dermatochalasis - upper lid, Meibomian gland dysfunction Dermatochalasis - upper lid, Meibomian gland dysfunction   Conjunctiva/Sclera White and quiet White and quiet, sutures dissolving, focal injection at suture sites   Cornea 3+ inferior Punctate epithelial erosions 1+ inferior Punctate epithelial erosions, trace Descemet's folds   Anterior Chamber moderate depth, narrow angles deep, clear, narrow angles   Iris Round and reactive  Round and dilated, No NVI   Lens 1-2+ Nuclear sclerosis, 2+ Cortical cataract 2+ Nuclear sclerosis, 2+ Cortical cataract, 1+Posterior subcapsular cataract   Vitreous blood stained vitreous condensations post vitrectomy, 90-95% silicone oil fill       Fundus Exam      Right Left   Disc mild Pallor, massive fibrosis along superior disc, regressed NVD Pink and Sharp, Compact   C/D Ratio  0.2   Macula extensive fibrosis with TRD Flat under oil, scattered DBH, fibrosis vastly improved, shallow residual SRF inferiorly   Vessels attenuated, Tortuous, +NV 360 -- regressed attenuated, Tortuous   Periphery Attached, scattered DBH, +fibrosis related to NVE, TRD along proximal ST arcades extending nasal to disc, 360 PRP attached, fibrosis improved, 360 PRP, scattered IRH; ORIGINALLY: Extensive fibrosis, +TRD, boat shaped subhyaloid heme inferiorly          IMAGING AND PROCEDURES  Imaging and Procedures for 10/01/2020  OCT, Retina - OU - Both Eyes       Right Eye Quality was good. Central Foveal Thickness: 379. Progression has been stable. Findings include abnormal foveal contour, intraretinal fluid, preretinal fibrosis, epiretinal membrane, macular pucker, vitreous traction, subretinal fluid (Severe tractional fibrosis and edema, interval progression of fibrosis and tractional detachment centrally).   Left Eye Quality was good. Central Foveal Thickness: 804. Progression has improved. Findings include abnormal foveal contour, vitreous traction, subretinal fluid, macular  pucker, epiretinal membrane, no IRF (Interval improvement in tractional fibrosis, mild interval improvement in SRF greatest inferiorly).   Notes *Images captured and stored on drive  Diagnosis / Impression:  OD: Severe tractional fibrosis and edema, interval progression of fibrosis and tractional detachment centrally OS: Interval improvement in tractional fibrosis, mild interval improvement in SRF greatest inferiorly  Clinical management:  See below  Abbreviations: NFP - Normal foveal profile. CME - cystoid macular edema. PED - pigment epithelial detachment. IRF - intraretinal fluid. SRF - subretinal fluid. EZ - ellipsoid zone. ERM - epiretinal membrane. ORA - outer retinal atrophy. ORT - outer retinal tubulation. SRHM - subretinal hyper-reflective material. IRHM - intraretinal hyper-reflective material                 ASSESSMENT/PLAN:    ICD-10-CM   1. Proliferative diabetic retinopathy of both eyes with macular edema associated with type 2 diabetes mellitus (HCC)  U31.4970   2. Retinal edema  H35.81 OCT, Retina - OU - Both Eyes  3. Retinal detachment, tractional, bilateral  H33.43   4. Essential hypertension  I10   5. Hypertensive retinopathy of both eyes  H35.033   6. Combined forms of age-related cataract of both eyes  H25.813   7. Retinal detachment, tractional, left  H33.42     1-3. Severe proliferative diabetic retinopathy with edema OU  - FA (02.04.22) shows florid NV with leakage and pooling OU (OS > OD)   - OD: severe tractional fibrosis, edema, and TRD  - OS:  With TRD and subhyaloid heme             - s/p IVA OD #1 (02.08.22), #2 (03.08.22)  - s/p IVE OS #1 (02.04.22) -- sample, #2 (03.08.22) -- sample  - s/p PRP OD (02.04.22)             - s/p PRP OS (2.8.22)  - exam shows regression of NV but consolidation of fibrosis and TRD OU - OCT shows severe tractional fibrosis and edema OU, now TRD OU - discussed findings and severity of disease, guarded prognosis and  extensive  treatments and follow ups need to stabilize eyes and improve vision -- will likely need surgery OU  - POM1 s/p PPV/POC/EL/FAX/SO + IVA OS, 03.17.2022             - doing well             - fibrosis improved,   - OCT shows shallow residual SRF inferiorly -- improving             - IOP 18 today             - cont   PF 4x/day OS                         Brimonidine BID OS                         PSO ung QHS OS              - cont face down positioning 50% of time; avoid laying flat on back              - post op drop and positioning instructions reviewed   - scheduled for TRD repair OD -- 25g PPV w/ MP/EL/FAX/SO + IVA OD on Thursday, 4.28.22  - RBA of procedure discussed, questions answered  - informed consent obtained and signed - f/u April 29 -- POV 1 for OD  4,5. Hypertensive retinopathy OU - discussed importance of tight BP control - monitor  6. Mixed Cataract OU - The symptoms of cataract, surgical options, and treatments and risks were discussed with patient. - discussed diagnosis and progression - not yet visually significant - monitor for now  Ophthalmic Meds Ordered this visit:  No orders of the defined types were placed in this encounter.     Return in about 4 days (around 10/05/2020) for POV.  There are no Patient Instructions on file for this visit.  This document serves as a record of services personally performed by Karie Chimera, MD, PhD. It was created on their behalf by Glee Arvin. Manson Passey, OA an ophthalmic technician. The creation of this record is the provider's dictation and/or activities during the visit.    Electronically signed by: Glee Arvin. Manson Passey, [place] 04.25.2022 12:56 PM  Karie Chimera, M.D., Ph.D. Diseases & Surgery of the Retina and Vitreous [facility]  I have reviewed the above documentation for accuracy and completeness, and I agree with the above. Karie Chimera, M.D., Ph.D. 10/01/20 12:56 PM   Abbreviations: M  myopia (nearsighted); A astigmatism; H hyperopia (farsighted); P presbyopia; Mrx spectacle prescription;  CTL contact lenses; OD right eye; OS left eye; OU both eyes  XT exotropia; ET esotropia; PEK punctate epithelial keratitis; PEE punctate epithelial erosions; DES dry eye syndrome; MGD meibomian gland dysfunction; ATs artificial tears; PFAT's preservative free artificial tears; NSC nuclear sclerotic cataract; PSC posterior subcapsular cataract; ERM epi-retinal membrane; PVD posterior vitreous detachment; RD retinal detachment; DM diabetes mellitus; DR diabetic retinopathy; NPDR non-proliferative diabetic retinopathy; PDR proliferative diabetic retinopathy; CSME clinically significant macular edema; DME diabetic macular edema; dbh dot blot hemorrhages; CWS cotton wool spot; POAG primary open angle glaucoma; C/D cup-to-disc ratio; HVF humphrey visual field; GVF goldmann visual field; OCT optical coherence tomography; IOP intraocular pressure; BRVO Branch retinal vein occlusion; CRVO central retinal vein occlusion; CRAO central retinal artery occlusion; BRAO branch retinal artery occlusion; RT retinal tear; SB scleral buckle; PPV pars plana  vitrectomy; VH Vitreous hemorrhage; PRP panretinal laser photocoagulation; IVK intravitreal kenalog; VMT vitreomacular traction; MH Macular hole;  NVD neovascularization of the disc; NVE neovascularization elsewhere; AREDS age related eye disease study; ARMD age related macular degeneration; POAG primary open angle glaucoma; EBMD epithelial/anterior basement membrane dystrophy; ACIOL anterior chamber intraocular lens; IOL intraocular lens; PCIOL posterior chamber intraocular lens; Phaco/IOL phacoemulsification with intraocular lens placement; [place] photorefractive keratectomy; LASIK laser assisted in situ keratomileusis; HTN hypertension; DM diabetes mellitus; COPD chronic obstructive pulmonary disease

## 2020-10-01 NOTE — H&P (Signed)
Shelly [name] is an 50 y.o. female.    Chief Complaint: proliferative diabetic retinopathy with tractional retinal detachment, RIGHT EYE  HPI: Pt with 1+ year history of decreased vision OU. On dilated exam was found to have extensive proliferative diabetic retinopathy with florid neovascularization OU and tractional retinal detachment OU. Pt underwent surgical repair of TRD OS on 3.17.22. After a discussion of the risks benefits and alternatives, the patient has elected to proceed with surgery to treat the proliferative diabetic retinopathy and repair the tractional retinal detachment OD -- 25g PPV w/ membrane peel, endolaser, and silicon oil + intravitreal avastin, under general anesthesia.  Past Medical History:  Diagnosis Date  . Anemia   . Anxiety   . Arthritis   . Cataract    Mixed OU  . CHF (congestive heart failure) (HCC)   . Coronary artery disease   . Diabetes mellitus without complication (HCC)   . Diabetic retinopathy (HCC)    PDR OU  . Headache    Cluster headaches in the past  . History of kidney stones    "2 sitting"  . Hypertension   . Hypertensive retinopathy    OU  . Myocardial infarction (HCC)   . Pneumonia   . Tobacco abuse     Past Surgical History:  Procedure Laterality Date  . EYE SURGERY Left 08/23/2020   PPV - Dr. [name]  . INJECTION OF SILICONE OIL Left 08/23/2020   Procedure: INJECTION OF SILICONE OIL;  Surgeon: [name], MD;  Location: [facility] OR;  Service: Ophthalmology;  Laterality: Left;  . MEMBRANE PEEL Left 08/23/2020   Procedure: MEMBRANE PEEL;  Surgeon: [name], MD;  Location: [facility] OR;  Service: Ophthalmology;  Laterality: Left;  . PARS PLANA VITRECTOMY Left 08/23/2020   Procedure: PARS PLANA VITRECTOMY WITH 25 GAUGE WITH ENDOLASER;  Surgeon: [name], MD;  Location: [facility] OR;  Service: Ophthalmology;  Laterality: Left;  . 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/23/2020   Procedure: REPAIR OF COMPLEX TRACTION RETINAL  DETACHMENT;  Surgeon: [name], MD;  Location: [facility] OR;  Service: Ophthalmology;  Laterality: Left;  . RIGHT/LEFT HEART CATH AND CORONARY ANGIOGRAPHY N/A 10/14/2019   Procedure: RIGHT/LEFT HEART CATH AND CORONARY ANGIOGRAPHY;  Surgeon: Dolores Patty, MD;  Location: [facility] INVASIVE CV LAB;  Service: Cardiovascular;  Laterality: N/A;    Family History  Problem Relation Age of Onset  . Sudden Cardiac Death Mother   . CAD Father   . CAD Paternal Grandmother   . CAD Paternal Grandfather    Social History:  reports that she has been smoking. She has been smoking about 1.50 packs per day. She has never used smokeless tobacco. She reports previous alcohol use. She reports that she does not use drugs.  Allergies: No Known Allergies  No medications prior to admission.    Review of systems otherwise negative  There were no vitals taken for this visit.  Physical exam: Mental status: oriented x3. Eyes: See eye exam associated with this date of surgery Ears, Nose, Throat: within normal limits Neck: Within Normal limits General: within normal limits Chest: Within normal limits Breast: deferred Heart: Within normal limits Abdomen: Within normal limits GU: deferred Extremities: within normal limits Skin: within normal limits  Assessment/Plan 1. Proliferative diabetic retinopathy with tractional retinal detachment, RIGHT EYE  Plan: To [facility] for 25g PPV w/ membrane peel, endolaser, silicon oil and intravitreal Avastin OD under general anesthesia - case scheduled for Thursday, 04.28.22, ~2 pm -- [facility]  OR 08  Gardiner Sleeper, M.D., Ph.D. Vitreoretinal Surgeon [facility]

## 2020-10-02 LAB — SARS CORONAVIRUS 2 (TAT 6-24 HRS): SARS Coronavirus 2: NEGATIVE

## 2020-10-03 ENCOUNTER — Encounter (HOSPITAL_COMMUNITY): Payer: Self-pay | Admitting: Ophthalmology

## 2020-10-03 NOTE — Anesthesia Preprocedure Evaluation (Addendum)
Anesthesia Evaluation  Patient identified by MRN, date of birth, ID band Patient awake    Reviewed: Allergy & Precautions, NPO status , Patient's Chart, lab work & pertinent test results  History of Anesthesia Complications Negative for: history of anesthetic complications  Airway Mallampati: II  TM Distance: >3 FB Neck ROM: Full    Dental  (+) Edentulous Lower, Edentulous Upper   Pulmonary Current Smoker and Patient abstained from smoking.,    Pulmonary exam normal        Cardiovascular hypertension, Pt. on medications + CAD, + Past MI and +CHF  Normal cardiovascular exam  Echo 10/14/19: EF 20-25%, global hypokinesis, severe LVE, severe RV dysfunction, mild RVE, mildly elevated PASP, mild LAE/RAE, mild MR, bicuspid AV   Neuro/Psych  Headaches, Anxiety negative psych ROS   GI/Hepatic negative GI ROS, Neg liver ROS,   Endo/Other  diabetes, Type 2, Oral Hypoglycemic Agents  Renal/GU negative Renal ROS  negative genitourinary   Musculoskeletal  (+) Arthritis ,   Abdominal   Peds  Hematology negative hematology ROS (+)   Anesthesia Other Findings tractional retinal detachment, right eye  Reproductive/Obstetrics negative OB ROS                           Anesthesia Physical Anesthesia Plan  ASA: IV  Anesthesia Plan: General   Post-op Pain Management:    Induction: Intravenous  PONV Risk Score and Plan: 3 and Treatment may vary due to age or medical condition, Ondansetron, Dexamethasone and Midazolam  Airway Management Planned: Oral ETT  Additional Equipment: Arterial line  Intra-op Plan:   Post-operative Plan: Extubation in OR  Informed Consent: I have reviewed the patients History and Physical, chart, labs and discussed the procedure including the risks, benefits and alternatives for the proposed anesthesia with the patient or authorized representative who has indicated his/her  understanding and acceptance.     Dental advisory given  Plan Discussed with: CRNA  Anesthesia Plan Comments: (PAT note written 10/03/2020 by Shonna Chock, PA-C. )      Anesthesia Quick Evaluation

## 2020-10-03 NOTE — Progress Notes (Signed)
Anesthesia Chart Review: SAME DAY WORK-UP   Case: [id number] Date/Time: 10/04/20 1325   Procedures:      REPAIR OF COMPLEX TRACTION RETINAL DETACHMENT (Right )     MEMBRANE PEEL (Right )   Anesthesia type: General   Pre-op diagnosis: tractional retinal detachment, right eye   [facility] ROOM 08 / [facility]   Surgeons: [name], MD      DISCUSSION: Patient is a 50 year old female scheduled for the above procedure. S/p left eye pars plana vitrectomy 08/23/20.    History includes smoking, CAD (non-obstructive CAD with 50% mid-distal LAD with evidence of dissection, 40% LCX & RCA, no PCI targets 10/14/19; 01/2020 cMRI subendocardial LGE c/w prior LAD and RCA infarcts), chronic systolic heart failure due to ischemic cardiomyopathy (EF 20% 10/2019), HTN, DM, anemia.  Last cardiology follow-up 08/27/20 was with Tonye Becket, NP. Volume status stable. BP significantly elevated, and Entresto dose increased. Plan to repeat echo and if EF remains < 35% refer to EP. (Echo is scheduled for 11/01/20 prior to her visit with Dr. Gala Romney that same day.)  10/01/2020 presurgical COVID-19 test negative.  Anesthesia team to evaluate on the day of surgery.   VS: Entresto increased at 08/27/20 HF clinic visit. BP Readings from Last 3 Encounters:  08/27/20 (!) 172/110  08/23/20 96/62  08/20/20 117/75   Pulse Readings from Last 3 Encounters:  08/27/20 75  08/23/20 81  08/20/20 84     PROVIDERS: Dema Severin, NP is PCP  - Cardiologist is Lance Muss, MD. Last office visit 05/01/20 with Boyce Medici, PA - HF cardiologist is Arvilla Meres, MD. Last office visit 03/15/20. HF medications last titrated with Karle Plumber, [facility] on 07/02/20 - at this visit, pt was "feeling all right"   LABS: For day of surgery as indicated. As of 09/03/20, Cr 0.91, glucose 129. H/H 12.8/43.4, PLT 250K 10/15/19.    IMAGES: CTA Chest 10/13/19: IMPRESSION: 1. Negative examination for pulmonary embolism. 2. Diffuse  bilateral bronchial wall thickening, consistent with nonspecific infectious or inflammatory bronchitis. 3. Prominent nonspecific mediastinal and hilar lymph nodes, likely reactive. 4. Cardiomegaly and trace pericardial effusion. 5. Scattered coronary artery calcifications. 6. Small volume ascites in the included upper abdomen. 7. Aortic Atherosclerosis (ICD10-I70.0).  CXR 10/13/19: IMPRESSION: 1. Mild interstitial prominence throughout both lungs which may reflect edema versus atypical infection. No focal airspace consolidation. 2. Heart size upper limits of normal.   EKG: 03/15/20:  Sinus rhythm with occasional Premature ventricular complexes Inferior infarct , age undetermined Anterolateral infarct , age undetermined Abnormal ECG the anterior MI and Inf. MI changes are new since previous ECG Confirmed by Kristeen Miss [phone]) on 03/15/2020 5:00:57 PM   CV: MRI Cardiac 02/06/20: IMPRESSION: 1. Subendocardial late gadolinium enhancement consistent with prior infarcts in LAD and RCA territories. >50% transmurality of LGE suggesting nonviability in basal to mid inferior walls, apical anterior/septal walls, and apex. <50% transmurality of LGE suggesting viability in mid anterior wall. 2.  Mild LV dilatation with severe systolic dysfunction (EF 30%) 3.  Normal RV size and systolic function (EF 66%)   Echo 10/14/19: IMPRESSIONS  1. Left ventricular ejection fraction, by estimation, is 20 to 25%. The  left ventricle has severely decreased function. The left ventricle  demonstrates global hypokinesis. The left ventricular internal cavity size  was severely dilated. Left ventricular  diastolic function could not be evaluated.  2. Right ventricular systolic function is severely reduced. The right  ventricular size is mildly enlarged. There is  mildly elevated pulmonary  artery systolic pressure.  3. Left atrial size was mildly dilated.  4. Right atrial size was mildly dilated.   5. The mitral valve is normal in structure. Mild mitral valve  regurgitation. No evidence of mitral stenosis.  6. The aortic valve is bicuspid. Aortic valve regurgitation is not  visualized. No aortic stenosis is present.  7. The inferior vena cava is dilated in size with <50% respiratory  variability, suggesting right atrial pressure of 15 mmHg.  - Comparison(s): No prior Echocardiogram.    RHC/LHC 10/14/19: LAD: Mid LAD to Dist LAD lesion is 50% stenosed. The lesion is dissected. LCX: Prox Cx to Mid Cx lesion is 40% stenosed. OM3 lesion is 20% stenosed. RCA: Prox RCA to Mid RCA lesion is 40% stenosed. Dist RCA lesion is 50% stenosed. RPAV lesion is 30% stenosed.   Prox RCA to Mid RCA lesion is 40% stenosed.  Dist RCA lesion is 50% stenosed.  RPAV lesion is 30% stenosed.  Prox Cx to Mid Cx lesion is 40% stenosed.  3rd Mrg lesion is 20% stenosed.  Mid LAD to Dist LAD lesion is 50% stenosed.   Findings: Ao = 106/75 (87) LV = 107/27 RA = 15 RV = 54/16 PA = 57/25 (40) PCW = 27 (v = 37) Fick cardiac output/index = 4.4/2.0 PVR = 1.4 WU SVR = 1297 Ao sat = 99% PA sat = 62%, 63%  Assessment: 1. CAD with diffuse non-obstructive disease and chronic dissection of mid LAD 2. 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 Elevated filling pressures with moderately reduced output  Plan/Discussion: Diurese. Titrate HF meds. Ideally will need cMRI to look for degree of LAD scar.     Past Medical History:  Diagnosis Date  . Anemia   . Anxiety   . Arthritis   . Cataract    Mixed OU  . CHF (congestive heart failure) (HCC)   . Coronary artery disease   . Diabetes mellitus without complication (HCC)   . Diabetic retinopathy (HCC)    PDR OU  . Headache    Cluster headaches in the past  . History of kidney stones    "2 sitting"  . Hypertension   . Hypertensive retinopathy    OU  . Myocardial infarction (HCC)   . Pneumonia   . Tobacco abuse     Past Surgical  History:  Procedure Laterality Date  . EYE SURGERY Left 08/23/2020   PPV - Dr. [name]  . INJECTION OF SILICONE OIL Left 08/23/2020   Procedure: INJECTION OF SILICONE OIL;  Surgeon: [name], MD;  Location: [facility] OR;  Service: Ophthalmology;  Laterality: Left;  . MEMBRANE PEEL Left 08/23/2020   Procedure: MEMBRANE PEEL;  Surgeon: [name], MD;  Location: [facility] OR;  Service: Ophthalmology;  Laterality: Left;  . PARS PLANA VITRECTOMY Left 08/23/2020   Procedure: PARS PLANA VITRECTOMY WITH 25 GAUGE WITH ENDOLASER;  Surgeon: [name], MD;  Location: [facility] OR;  Service: Ophthalmology;  Laterality: Left;  . 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/23/2020   Procedure: REPAIR OF COMPLEX TRACTION RETINAL DETACHMENT;  Surgeon: [name], MD;  Location: [facility] OR;  Service: Ophthalmology;  Laterality: Left;  . RIGHT/LEFT HEART CATH AND CORONARY ANGIOGRAPHY N/A 10/14/2019   Procedure: RIGHT/LEFT HEART CATH AND CORONARY ANGIOGRAPHY;  Surgeon: Dolores Patty, MD;  Location: [facility] INVASIVE CV LAB;  Service: Cardiovascular;  Laterality: N/A;    MEDICATIONS: No current facility-administered medications for this encounter.   Marland Kitchen aspirin EC  81 MG tablet  . atorvastatin (LIPITOR) 80 MG tablet  . carvedilol (COREG) 3.125 MG tablet  . digoxin (LANOXIN) 0.125 MG tablet  . empagliflozin (JARDIANCE) 25 MG TABS tablet  . furosemide (LASIX) 20 MG tablet  . hydrOXYzine (ATARAX/VISTARIL) 25 MG tablet  . metFORMIN (GLUCOPHAGE) 1000 MG tablet  . potassium chloride (KLOR-CON) 10 MEQ tablet  . prednisoLONE acetate (PRED FORTE) 1 % ophthalmic suspension  . sacubitril-valsartan (ENTRESTO) 97-103 MG  . spironolactone (ALDACTONE) 25 MG tablet  . TRULICITY 3 MG/0.5ML SOPN  . vitamin B-12 (CYANOCOBALAMIN) 1000 MCG tablet    Shonna Chock, PA-C Surgical Short Stay/Anesthesiology [facility] Phone [phone] [facility] Phone [phone] 10/03/2020 12:28 PM

## 2020-10-03 NOTE — Progress Notes (Signed)
PCP - [facility] Cardiologist - Arvilla Meres MD  PPM/ICD - denies   Chest x-ray - 10/13/2019 EKG -03/15/2020  Stress Test -  ECHO - 10/14/2019 Cardiac Cath -10/14/2019   Sleep Study - na CPAP -na   Fasting Blood Sugar -  Checks Blood Sugar _____ times a day  Blood Thinner Instructions:na  Aspirin Instructions:last dose 4/26. Instructed patient to call surgeon's office for further instructions.    COVID TEST- 10/01/20 negative   Anesthesia review: yes for cardiac history.   All instructions explained to the patient, with a verbal understanding of the material.Patient also instructed to self quarantine after being tested for COVID-19. The opportunity to ask questions was provided.

## 2020-10-04 ENCOUNTER — Ambulatory Visit (HOSPITAL_COMMUNITY): Payer: 59 | Admitting: Vascular Surgery

## 2020-10-04 ENCOUNTER — Encounter (HOSPITAL_COMMUNITY): Payer: Self-pay | Admitting: Ophthalmology

## 2020-10-04 ENCOUNTER — Encounter (HOSPITAL_COMMUNITY)
Admission: RE | Disposition: A | Discharge status: Home / Self Care | Payer: Self-pay | Source: Ambulatory Visit | Attending: Ophthalmology

## 2020-10-04 ENCOUNTER — Other Ambulatory Visit: Payer: Self-pay

## 2020-10-04 ENCOUNTER — Ambulatory Visit (HOSPITAL_COMMUNITY)
Admission: RE | Admit: 2020-10-04 | Discharge: 2020-10-04 | Disposition: A | Discharge status: Home / Self Care | Payer: 59 | Source: Ambulatory Visit | Attending: Ophthalmology | Admitting: Ophthalmology

## 2020-10-04 DIAGNOSIS — F17210 Nicotine dependence, cigarettes, uncomplicated: Secondary | ICD-10-CM | POA: Diagnosis not present

## 2020-10-04 DIAGNOSIS — I509 Heart failure, unspecified: Secondary | ICD-10-CM | POA: Insufficient documentation

## 2020-10-04 DIAGNOSIS — I2510 Atherosclerotic heart disease of native coronary artery without angina pectoris: Secondary | ICD-10-CM | POA: Insufficient documentation

## 2020-10-04 DIAGNOSIS — Z8249 Family history of ischemic heart disease and other diseases of the circulatory system: Secondary | ICD-10-CM | POA: Insufficient documentation

## 2020-10-04 DIAGNOSIS — I110 Hypertensive heart disease with heart failure: Secondary | ICD-10-CM | POA: Insufficient documentation

## 2020-10-04 DIAGNOSIS — I252 Old myocardial infarction: Secondary | ICD-10-CM | POA: Insufficient documentation

## 2020-10-04 DIAGNOSIS — E113531 Type 2 diabetes mellitus with proliferative diabetic retinopathy with traction retinal detachment not involving the macula, right eye: Secondary | ICD-10-CM | POA: Insufficient documentation

## 2020-10-04 DIAGNOSIS — E113521 Type 2 diabetes mellitus with proliferative diabetic retinopathy with traction retinal detachment involving the macula, right eye: Secondary | ICD-10-CM | POA: Diagnosis not present

## 2020-10-04 HISTORY — PX: PHOTOCOAGULATION WITH LASER: SHX6027

## 2020-10-04 HISTORY — PX: INJECTION OF SILICONE OIL: SHX6422

## 2020-10-04 HISTORY — PX: MEMBRANE PEEL: SHX5967

## 2020-10-04 HISTORY — PX: PARS PLANA VITRECTOMY: SHX2166

## 2020-10-04 LAB — GLUCOSE, CAPILLARY
Glucose-Capillary: 135 mg/dL — ABNORMAL HIGH (ref 70–99)
Glucose-Capillary: 180 mg/dL — ABNORMAL HIGH (ref 70–99)

## 2020-10-04 LAB — CBC
HCT: 48.1 % — ABNORMAL HIGH (ref 36.0–46.0)
Hemoglobin: 15.6 g/dL — ABNORMAL HIGH (ref 12.0–15.0)
MCH: 28.8 pg (ref 26.0–34.0)
MCHC: 32.4 g/dL (ref 30.0–36.0)
MCV: 88.9 fL (ref 80.0–100.0)
Platelets: 310 10*3/uL (ref 150–400)
RBC: 5.41 MIL/uL — ABNORMAL HIGH (ref 3.87–5.11)
RDW: 13.2 % (ref 11.5–15.5)
WBC: 11.5 10*3/uL — ABNORMAL HIGH (ref 4.0–10.5)
nRBC: 0 % (ref 0.0–0.2)

## 2020-10-04 LAB — BASIC METABOLIC PANEL
Anion gap: 11 (ref 5–15)
BUN: 10 mg/dL (ref 6–20)
CO2: 26 mmol/L (ref 22–32)
Calcium: 9.3 mg/dL (ref 8.9–10.3)
Chloride: 100 mmol/L (ref 98–111)
Creatinine, Ser: 0.86 mg/dL (ref 0.44–1.00)
GFR, Estimated: >60 mL/min (ref >60)
Glucose, Bld: 142 mg/dL — ABNORMAL HIGH (ref 70–99)
Potassium: 3.9 mmol/L (ref 3.5–5.1)
Sodium: 137 mmol/L (ref 135–145)

## 2020-10-04 SURGERY — MEMBRANECTOMY, RETINA
Anesthesia: General | Site: Eye | Laterality: Right

## 2020-10-04 MED ORDER — BSS IO SOLN
INTRAOCULAR | Status: AC
  Filled 2020-10-04: qty 15

## 2020-10-04 MED ORDER — ESMOLOL HCL 100 MG/10ML IV SOLN
INTRAVENOUS | Status: DC | PRN
  Administered 2020-10-04: 20 mg via INTRAVENOUS
  Administered 2020-10-04: 30 mg via INTRAVENOUS
  Administered 2020-10-04: 20 mg via INTRAVENOUS

## 2020-10-04 MED ORDER — ETOMIDATE 2 MG/ML IV SOLN
INTRAVENOUS | Status: DC | PRN
  Administered 2020-10-04: 16 mg via INTRAVENOUS

## 2020-10-04 MED ORDER — ATROPINE SULFATE 1 % OP SOLN
1.0000 [drp] | OPHTHALMIC | Status: AC | PRN
  Administered 2020-10-04 (×3): 1 [drp] via OPHTHALMIC
  Filled 2020-10-04: qty 5

## 2020-10-04 MED ORDER — TOBRAMYCIN-DEXAMETHASONE 0.3-0.1 % OP OINT
TOPICAL_OINTMENT | OPHTHALMIC | Status: AC
  Filled 2020-10-04: qty 3.5

## 2020-10-04 MED ORDER — LIDOCAINE HCL (PF) 2 % IJ SOLN
INTRAMUSCULAR | Status: AC
  Filled 2020-10-04: qty 10

## 2020-10-04 MED ORDER — BRIMONIDINE TARTRATE 0.2 % OP SOLN
OPHTHALMIC | Status: DC | PRN
  Administered 2020-10-04: 1 [drp] via OPHTHALMIC

## 2020-10-04 MED ORDER — TROPICAMIDE 1 % OP SOLN
1.0000 [drp] | OPHTHALMIC | Status: AC | PRN
  Administered 2020-10-04 (×3): 1 [drp] via OPHTHALMIC
  Filled 2020-10-04: qty 15

## 2020-10-04 MED ORDER — TRIAMCINOLONE ACETONIDE 40 MG/ML IJ SUSP
INTRAMUSCULAR | Status: DC | PRN
  Administered 2020-10-04: 40 mg

## 2020-10-04 MED ORDER — GATIFLOXACIN 0.5 % OP SOLN OPTIME - NO CHARGE
OPHTHALMIC | Status: DC | PRN
  Administered 2020-10-04: 1 [drp] via OPHTHALMIC

## 2020-10-04 MED ORDER — FENTANYL CITRATE (PF) 250 MCG/5ML IJ SOLN
INTRAMUSCULAR | Status: AC
  Filled 2020-10-04: qty 5

## 2020-10-04 MED ORDER — ONDANSETRON HCL 4 MG/2ML IJ SOLN
INTRAMUSCULAR | Status: AC
  Filled 2020-10-04: qty 2

## 2020-10-04 MED ORDER — PHENYLEPHRINE HCL 10 % OP SOLN
1.0000 [drp] | OPHTHALMIC | Status: AC | PRN
  Administered 2020-10-04 (×3): 1 [drp] via OPHTHALMIC
  Filled 2020-10-04: qty 5

## 2020-10-04 MED ORDER — DORZOLAMIDE HCL-TIMOLOL MAL 2-0.5 % OP SOLN
OPHTHALMIC | Status: AC
  Filled 2020-10-04: qty 10

## 2020-10-04 MED ORDER — BRILLIANT BLUE G 0.025 % IO SOSY
0.5000 mL | PREFILLED_SYRINGE | INTRAOCULAR | Status: DC
  Filled 2020-10-04: qty 0.5

## 2020-10-04 MED ORDER — CHLORHEXIDINE GLUCONATE 0.12 % MT SOLN
OROMUCOSAL | Status: AC
  Administered 2020-10-04: 15 mL via OROMUCOSAL
  Filled 2020-10-04: qty 15

## 2020-10-04 MED ORDER — FENTANYL CITRATE (PF) 250 MCG/5ML IJ SOLN
INTRAMUSCULAR | Status: DC | PRN
  Administered 2020-10-04 (×2): 25 ug via INTRAVENOUS
  Administered 2020-10-04: 100 ug via INTRAVENOUS

## 2020-10-04 MED ORDER — CHLORHEXIDINE GLUCONATE 0.12 % MT SOLN
15.0000 mL | OROMUCOSAL | Status: AC
  Filled 2020-10-04: qty 15

## 2020-10-04 MED ORDER — LIDOCAINE 2% (20 MG/ML) 5 ML SYRINGE
INTRAMUSCULAR | Status: AC
  Filled 2020-10-04: qty 5

## 2020-10-04 MED ORDER — BACITRACIN-POLYMYXIN B 500-10000 UNIT/GM OP OINT
TOPICAL_OINTMENT | OPHTHALMIC | Status: DC | PRN
  Administered 2020-10-04: 1 via OPHTHALMIC

## 2020-10-04 MED ORDER — CEFTAZIDIME 1 G IJ SOLR
INTRAMUSCULAR | Status: AC
  Filled 2020-10-04: qty 1

## 2020-10-04 MED ORDER — STERILE WATER FOR INJECTION IJ SOLN
INTRAMUSCULAR | Status: AC
  Filled 2020-10-04: qty 10

## 2020-10-04 MED ORDER — ONDANSETRON HCL 4 MG/2ML IJ SOLN
INTRAMUSCULAR | Status: DC | PRN
  Administered 2020-10-04: 4 mg via INTRAVENOUS

## 2020-10-04 MED ORDER — EPINEPHRINE PF 1 MG/ML IJ SOLN
INTRAOCULAR | Status: DC | PRN
  Administered 2020-10-04: 500.3 mL

## 2020-10-04 MED ORDER — MIDAZOLAM HCL 2 MG/2ML IJ SOLN
INTRAMUSCULAR | Status: AC
  Filled 2020-10-04: qty 2

## 2020-10-04 MED ORDER — ROCURONIUM BROMIDE 10 MG/ML (PF) SYRINGE
PREFILLED_SYRINGE | INTRAVENOUS | Status: DC | PRN
  Administered 2020-10-04: 20 mg via INTRAVENOUS
  Administered 2020-10-04: 50 mg via INTRAVENOUS
  Administered 2020-10-04: 30 mg via INTRAVENOUS

## 2020-10-04 MED ORDER — DEXAMETHASONE SODIUM PHOSPHATE 10 MG/ML IJ SOLN
INTRAMUSCULAR | Status: AC
  Filled 2020-10-04: qty 1

## 2020-10-04 MED ORDER — PROPARACAINE HCL 0.5 % OP SOLN
1.0000 [drp] | OPHTHALMIC | Status: AC | PRN
  Administered 2020-10-04 (×3): 1 [drp] via OPHTHALMIC
  Filled 2020-10-04: qty 15

## 2020-10-04 MED ORDER — EPINEPHRINE PF 1 MG/ML IJ SOLN
INTRAMUSCULAR | Status: AC
  Filled 2020-10-04: qty 1

## 2020-10-04 MED ORDER — TRIAMCINOLONE ACETONIDE 40 MG/ML IJ SUSP
INTRAMUSCULAR | Status: AC
  Filled 2020-10-04: qty 5

## 2020-10-04 MED ORDER — DEXAMETHASONE SODIUM PHOSPHATE 10 MG/ML IJ SOLN
INTRAMUSCULAR | Status: DC | PRN
  Administered 2020-10-04: 4 mg via INTRAVENOUS

## 2020-10-04 MED ORDER — NA CHONDROIT SULF-NA HYALURON 40-30 MG/ML IO SOSY
INTRAOCULAR | Status: DC | PRN
  Administered 2020-10-04 (×2): 0.5 mL via INTRAOCULAR

## 2020-10-04 MED ORDER — BRIMONIDINE TARTRATE 0.2 % OP SOLN
OPHTHALMIC | Status: AC
  Filled 2020-10-04: qty 5

## 2020-10-04 MED ORDER — STERILE WATER FOR INJECTION IJ SOLN
INTRAMUSCULAR | Status: DC | PRN
  Administered 2020-10-04: 20 mL

## 2020-10-04 MED ORDER — ACETAZOLAMIDE SODIUM 500 MG IJ SOLR
INTRAMUSCULAR | Status: AC
  Filled 2020-10-04: qty 500

## 2020-10-04 MED ORDER — GATIFLOXACIN 0.5 % OP SOLN
OPHTHALMIC | Status: AC
  Filled 2020-10-04: qty 2.5

## 2020-10-04 MED ORDER — DORZOLAMIDE HCL-TIMOLOL MAL 2-0.5 % OP SOLN
OPHTHALMIC | Status: DC | PRN
  Administered 2020-10-04: 1 [drp] via OPHTHALMIC

## 2020-10-04 MED ORDER — PREDNISOLONE ACETATE 1 % OP SUSP
OPHTHALMIC | Status: DC | PRN
  Administered 2020-10-04: 1 [drp] via OPHTHALMIC

## 2020-10-04 MED ORDER — SODIUM CHLORIDE (PF) 0.9 % IJ SOLN
INTRAMUSCULAR | Status: AC
  Filled 2020-10-04: qty 10

## 2020-10-04 MED ORDER — NA CHONDROIT SULF-NA HYALURON 40-30 MG/ML IO SOSY
INTRAOCULAR | Status: AC
  Filled 2020-10-04: qty 1

## 2020-10-04 MED ORDER — BACITRACIN-POLYMYXIN B 500-10000 UNIT/GM OP OINT
TOPICAL_OINTMENT | OPHTHALMIC | Status: AC
  Filled 2020-10-04: qty 3.5

## 2020-10-04 MED ORDER — MIDAZOLAM HCL 5 MG/5ML IJ SOLN
INTRAMUSCULAR | Status: DC | PRN
  Administered 2020-10-04: 2 mg via INTRAVENOUS

## 2020-10-04 MED ORDER — LIDOCAINE 2% (20 MG/ML) 5 ML SYRINGE
INTRAMUSCULAR | Status: DC | PRN
  Administered 2020-10-04: 100 mg via INTRAVENOUS

## 2020-10-04 MED ORDER — BEVACIZUMAB CHEMO INJECTION 1.25MG/0.05ML SYRINGE FOR KALEIDOSCOPE
1.2500 mg | INTRAVITREAL | Status: AC
  Administered 2020-10-04: 1.25 mg via INTRAVITREAL
  Filled 2020-10-04 (×2): qty 0.1

## 2020-10-04 MED ORDER — PROPOFOL 10 MG/ML IV BOLUS
INTRAVENOUS | Status: AC
  Filled 2020-10-04: qty 20

## 2020-10-04 MED ORDER — PHENYLEPHRINE HCL-NACL 10-0.9 MG/250ML-% IV SOLN
INTRAVENOUS | Status: DC | PRN
  Administered 2020-10-04: 25 ug/min via INTRAVENOUS

## 2020-10-04 MED ORDER — ATROPINE SULFATE 1 % OP SOLN
OPHTHALMIC | Status: AC
  Filled 2020-10-04: qty 5

## 2020-10-04 MED ORDER — ROCURONIUM BROMIDE 10 MG/ML (PF) SYRINGE
PREFILLED_SYRINGE | INTRAVENOUS | Status: AC
  Filled 2020-10-04: qty 10

## 2020-10-04 MED ORDER — SODIUM CHLORIDE 0.9 % IV SOLN: INTRAVENOUS | Status: DC

## 2020-10-04 MED ORDER — PREDNISOLONE ACETATE 1 % OP SUSP
OPHTHALMIC | Status: AC
  Filled 2020-10-04: qty 5

## 2020-10-04 MED ORDER — INDOCYANINE GREEN 25 MG IV SOLR
INTRAVENOUS | Status: AC
  Filled 2020-10-04: qty 10

## 2020-10-04 MED ORDER — BUPIVACAINE HCL (PF) 0.75 % IJ SOLN
INTRAMUSCULAR | Status: AC
  Filled 2020-10-04: qty 10

## 2020-10-04 MED ORDER — BSS PLUS IO SOLN
INTRAOCULAR | Status: AC
  Filled 2020-10-04: qty 500

## 2020-10-04 MED ORDER — POLYMYXIN B SULFATE 500000 UNITS IJ SOLR
INTRAMUSCULAR | Status: AC
  Filled 2020-10-04: qty 500000

## 2020-10-04 MED ORDER — SUGAMMADEX SODIUM 200 MG/2ML IV SOLN
INTRAVENOUS | Status: DC | PRN
  Administered 2020-10-04: 170 mg via INTRAVENOUS

## 2020-10-04 SURGICAL SUPPLY — 62 items
APL SWBSTK 6 STRL LF DISP (MISCELLANEOUS) ×8
APPLICATOR COTTON TIP 6 STRL (MISCELLANEOUS) ×8 IMPLANT
APPLICATOR COTTON TIP 6IN STRL (MISCELLANEOUS) ×12
BALL CTTN LRG ABS STRL LF (GAUZE/BANDAGES/DRESSINGS)
BAND WRIST GAS GREEN (MISCELLANEOUS) IMPLANT
BLADE EYE CATARACT 19 1.4 BEAV (BLADE) IMPLANT
BNDG EYE OVAL (GAUZE/BANDAGES/DRESSINGS) ×3 IMPLANT
CABLE BIPOLOR RESECTION CORD (MISCELLANEOUS) ×3 IMPLANT
CANNULA ANT CHAM MAIN (OPHTHALMIC RELATED) IMPLANT
CANNULA FLEX TIP 25G (CANNULA) ×3 IMPLANT
CANNULA VLV SOFT TIP 25GA (OPHTHALMIC) IMPLANT
CHANDELIER CONSTEL 25G RFID (OPHTHALMIC) ×3 IMPLANT
CLSR STERI-STRIP ANTIMIC 1/2X4 (GAUZE/BANDAGES/DRESSINGS) ×3 IMPLANT
COTTONBALL LRG STERILE PKG (GAUZE/BANDAGES/DRESSINGS) IMPLANT
DRAPE MICROSCOPE LEICA 46X105 (MISCELLANEOUS) ×3 IMPLANT
DRAPE OPHTHALMIC 77X100 STRL (CUSTOM PROCEDURE TRAY) ×3 IMPLANT
FILTER BLUE MILLIPORE (MISCELLANEOUS) IMPLANT
FILTER STRAW FLUID ASPIR (MISCELLANEOUS) IMPLANT
FORCEPS GRIESHABER ILM 25G A (INSTRUMENTS) ×3 IMPLANT
FORCEPS GRIESHABER MAX 25G (MISCELLANEOUS) ×3 IMPLANT
GAS AUTO FILL CONSTEL (OPHTHALMIC)
GAS AUTO FILL CONSTELLATION (OPHTHALMIC) IMPLANT
GAS WRIST BAND GREEN (MISCELLANEOUS)
GLOVE BIO SURGEON STRL SZ7.5 (GLOVE) ×6 IMPLANT
GLOVE BIOGEL M 7.0 STRL (GLOVE) ×3 IMPLANT
GOWN STRL REUS W/ TWL LRG LVL3 (GOWN DISPOSABLE) ×4 IMPLANT
GOWN STRL REUS W/ TWL XL LVL3 (GOWN DISPOSABLE) ×2 IMPLANT
GOWN STRL REUS W/TWL LRG LVL3 (GOWN DISPOSABLE) ×6
GOWN STRL REUS W/TWL XL LVL3 (GOWN DISPOSABLE) ×3
KIT BASIN OR (CUSTOM PROCEDURE TRAY) ×3 IMPLANT
KIT PERFLUORON PROCEDURE 5ML (MISCELLANEOUS) IMPLANT
LENS MACULAR ASPHERIC CONSTEL (OPHTHALMIC) IMPLANT
LENS VITRECTOMY FLAT OCLR DISP (MISCELLANEOUS) ×3 IMPLANT
LOOP FINESSE 25 GA (MISCELLANEOUS) IMPLANT
MICROPICK 25G (MISCELLANEOUS)
NEEDLE 18GX1X1/2 (RX/OR ONLY) (NEEDLE) ×3 IMPLANT
NEEDLE 25GX 5/8IN NON SAFETY (NEEDLE) ×9 IMPLANT
NEEDLE HYPO 30X.5 LL (NEEDLE) ×6 IMPLANT
NEEDLE PRECISIONGLIDE 27X1.5 (NEEDLE) IMPLANT
NS IRRIG 1000ML POUR BTL (IV SOLUTION) ×3 IMPLANT
OIL SILICONE OPHTHALMIC 1000 (Ophthalmic Related) ×3 IMPLANT
PACK VITRECTOMY CUSTOM (CUSTOM PROCEDURE TRAY) ×3 IMPLANT
PAD ARMBOARD 7.5X6 YLW CONV (MISCELLANEOUS) ×6 IMPLANT
PAK PIK VITRECTOMY CVS 25GA (OPHTHALMIC) ×3 IMPLANT
PENCIL BIPOLAR 25GA STR DISP (OPHTHALMIC RELATED) ×3 IMPLANT
PICK MICROPICK 25G (MISCELLANEOUS) IMPLANT
PROBE ENDO DIATHERMY 25G (MISCELLANEOUS) ×3 IMPLANT
PROBE LASER ILLUM FLEX CVD 25G (OPHTHALMIC) ×3 IMPLANT
RETRACTOR IRIS FLEX 25G GRIESH (INSTRUMENTS) IMPLANT
SCISSORS TIP ADVANCED DSP 25GA (INSTRUMENTS) ×3 IMPLANT
SET INJECTOR OIL FLUID CONSTEL (OPHTHALMIC) ×3 IMPLANT
SHIELD EYE LENSE ONLY DISP (GAUZE/BANDAGES/DRESSINGS) ×3 IMPLANT
STOPCOCK 4 WAY LG BORE MALE ST (IV SETS) IMPLANT
SUT VICRYL 7 0 TG140 8 (SUTURE) ×3 IMPLANT
SYR 10ML LL (SYRINGE) ×6 IMPLANT
SYR 20ML LL LF (SYRINGE) ×3 IMPLANT
SYR 5ML LL (SYRINGE) ×3 IMPLANT
SYR BULB EAR ULCER 3OZ GRN STR (SYRINGE) IMPLANT
SYR TB 1ML LUER SLIP (SYRINGE) ×6 IMPLANT
TOWEL GREEN STERILE FF (TOWEL DISPOSABLE) ×3 IMPLANT
TUBING HIGH PRESS EXTEN 6IN (TUBING) ×3 IMPLANT
WATER STERILE IRR 1000ML POUR (IV SOLUTION) ×3 IMPLANT

## 2020-10-04 NOTE — Discharge Instructions (Signed)
POSTOPERATIVE INSTRUCTIONS  Your doctor has performed vitreoretinal surgery on you at [facility].  - Keep eye patched and shielded until seen by Dr. Coralee Edberg 8 AM tomorrow in clinic - Do not use drops until return - FACE DOWN POSITIONING WHILE AWAKE - Sleep with belly down or on left side, avoid laying flat on back.    - No strenuous bending, stooping or lifting.  - You may not drive until further notice.  - If your doctor used a gas bubble in your eye during the procedure he will advise you on postoperative positioning. If you have a gas bubble you will be wearing a green bracelet that was applied in the operating room. The green bracelet should stay on as long as the gas bubble is in your eye. While the gas bubble is present you should not fly in an airplane. If you require general anesthesia while the gas bubble is present you must notify your anesthesiologist that an intraocular gas bubble is present so he can take the appropriate precautions.  - Tylenol or any other over-the-counter pain reliever can be used according to your doctor. If more pain medicine is required, your doctor will have a prescription for you.  - You may read, go up and down stairs, and watch television.     Irie Dowson, M.D., Ph.D.  

## 2020-10-04 NOTE — Op Note (Signed)
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 Fluid air exchange, RIGHT EYE 5.[id number] silicon oil, RIGHT EYE 6. Intravitreal injection of bevacizumab, RIGHT EYE   Indications for procedure: This is a66yo Fwith a history of DM2, HTN, CHF with Proliferative diabetic retinopathy in both eyes, and right eye with preretinal fibrosis and tractionalretinal detachment. Of note, the left eye also had a tractional retinal detachment and underwent surgical repair on 3.17.22. After discussing the risks, benefits, and alternatives to surgery, the patient electively decided toproceed with surgeryand informed consent was obtained.The surgery was an attempt to remove all preretinal/tractionalmembranes from the right eyeand deliver intravitreal Avastin to stabilize her diabetic retinopathy and potentially improve the vision within the reasonable expectations of the surgeon.  Procedure in Detail:  The patient was met in the pre-operative holding area where their identification data was verified.It was noted that there was a signed, informed consent in the chart andthe righteyewasverbally verified by the patient for surgery. Then theoperative right eye was markedwith the surgeon's initialswith a marking pen.The patient was then taken to the operating room and placed in the supine position.General endotracheal anesthesia was induced.  TheRIGHT EYEwas then prepped with 5% betadine and draped in the normal fashion for ophthalmic surgery.The microscope was draped and swung into position, and a secondary time-out  was performed to identify the correct patient, eyes, procedures, and any allergies.  A 25 gauge trocar was inserted in a 30-45 degrees fashion into the inferotemporal quadrant59m posterior tothelimbus in this phakic patient. Correct positioning within the vitreous was verified externally with the light pipe.The infusion was then connected to the cannula and BSS infusion was commenced. Similarly, a 25g trocar was placed in the inferonasal quadrant and a 25g chandelier light was inserted and secured through the trocar.Additional ports were placed in the superonasal and superotemporal quadrants. Viscoat was placed on the cornea. The BIOMwas used to visualize the posterior segment. The patient had extensive fibrosis and preretinal membranes overlying the posterior pole and extending peripherally. The optic disc was completely obscured by fibrosis and the macula was completely tractionally detached. A core vitrectomy was performed using the BIOM visualization system, vitrectomy probe and light pipe to see the posterior pole. We were able to appreciate a dense preretinaltractional fibrotic membrane/plaque emanating from the optic disc and along the vascular arcades and macula. Of note, the retinal vasculature was very attenuated and sclerotic and a good majority of the macula and posterior pole was ischemic in appearance.Kenalog was used tomark thevitreous and assist in dissecting vitreous membranes.   A macular contact lens was placed on the eye. End-grasping ILM forceps, MaxGrip forceps, intraocular scissors, the vitrectomy probe and light pipe were used to carefully peel and dissect preretinal membranes and fibrosisoff the entire surface of the retina as was deemed safe.  The wide angle viewing system was brought back into position to assist in peeling peripheral membranes. One small retinal hole were noted in the superotemporal peripheral midzone -- just outside the ST arcades. It was marked  with diathermy. An air fluid exchange was then performedto remove the intravitreal BSS and to remove the subretinal fluid via the supero-temporal retinal break. Under air, endolaser was used to laser around the breaks and perform fill in PRP  posterior to the existing laser. Some laser uptake was impeded by residual shallow subretinal fluid that was unable to be completely removed. Then, the inferonasal port and chandelier light were removed and the sclerotomy was closed with a 7-0 vicryl suture. NGIT,1959DI siliconoil wasinjected via the superotemporaltrocar and filled up to the level of the lens plane with the aide of the venting cannula.  Theinferotemporal and superonasal ports were then removed and sutured with 7-0 vicryl, there was no leakage. Silicon oil was removed through the superotempora port to lower the eye pressure to a soft physiologic IOP. Then, thesuperotemporal portwas removed and sutured with 7-0 vicryl.There was no leakage from the sclerotomy sites and the eye remained at physiologic pressure by digital palpation.  Subconjunctival injections of kefzol+ bacitracin + polymixin b and kenalogwere then administered. The eye was prepped again with 5% betadine and 0.05 cc of bevacizumab was injected into the vitreous cavity via 30g needle, 63m posterior to the limbus in the superotemporal quadrant. 5% betadine was reapplied to the injection site and then rinsed out of the eye with sterile BSS. The drapes and lid speculum were removed and antibiotic and steroid drops as well as antibiotic ointment were placed in the eye and then the eye was patched and shielded.The patient was then taken to the post-operative area for recovery having tolerated the procedure well.She was instructed to perform face down positioning postoperatively and to follow up in clinic the following morning as scheduled.  Estimate blood lost:minimal Complications: None

## 2020-10-04 NOTE — Brief Op Note (Signed)
10/04/2020  5:05 PM  PATIENT:  Shelly [name]  50 y.o. female  PRE-OPERATIVE DIAGNOSIS:  tractional retinal detachment, right eye  POST-OPERATIVE DIAGNOSIS:  tractional retinal detachment, right eye  PROCEDURE:  Procedure(s): MEMBRANE PEEL (Right) PARS PLANA VITRECTOMY WITH 25 GAUGE, INJECTION OF AVASTIN (Right) PHOTOCOAGULATION WITH LASER (Right) INJECTION OF SILICONE OIL (Right)  SURGEON:  Surgeon(s) and Role:    [name], MD - Primary  ASSISTANTS: Laurian Brim, Ophthalmic Assistant    ANESTHESIA:   general  EBL:  minimal   BLOOD ADMINISTERED:none  DRAINS: none   LOCAL MEDICATIONS USED:  NONE  SPECIMEN:  No Specimen  DISPOSITION OF SPECIMEN:  N/A  COUNTS:  YES  TOURNIQUET:  * No tourniquets in log *  DICTATION: .Note written in EPIC  PLAN OF CARE: Discharge to home after PACU  PATIENT DISPOSITION:  PACU - hemodynamically stable.   Delay start of Pharmacological VTE agent (>24hrs) due to surgical blood loss or risk of bleeding: not applicable

## 2020-10-04 NOTE — Interval H&P Note (Signed)
History and Physical Interval Note:  10/04/2020 1:02 PM  Shelly [name]  has presented today for surgery, with the diagnosis of tractional retinal detachment, right eye.  The various methods of treatment have been discussed with the patient and family. After consideration of risks, benefits and other options for treatment, the patient has consented to  Procedure(s): REPAIR OF COMPLEX TRACTION RETINAL DETACHMENT (Right) MEMBRANE PEEL (Right) PARS PLANA VITRECTOMY WITH 25 GAUGE (Right) PHOTOCOAGULATION WITH LASER (Right) INJECTION OF SILICONE OIL (Right) as a surgical intervention.  The patient's history has been reviewed, patient examined, no change in status, stable for surgery.  I have reviewed the patient's chart and labs.  Questions were answered to the patient's satisfaction.     [name]

## 2020-10-04 NOTE — Anesthesia Postprocedure Evaluation (Signed)
Anesthesia Post Note  Patient: VIVIANA TRIMBLE  Procedure(s) Performed: MEMBRANE PEEL (Right Eye) PARS PLANA VITRECTOMY WITH 25 GAUGE, INJECTION OF AVASTIN (Right Eye) PHOTOCOAGULATION WITH LASER (Right Eye) INJECTION OF SILICONE OIL (Right Eye)     Patient location during evaluation: PACU Anesthesia Type: General Level of consciousness: awake and alert Pain management: pain level controlled Vital Signs Assessment: post-procedure vital signs reviewed and stable Respiratory status: spontaneous breathing, nonlabored ventilation and respiratory function stable Cardiovascular status: blood pressure returned to baseline and stable Postop Assessment: no apparent nausea or vomiting Anesthetic complications: no   No complications documented.  Last Vitals:  Vitals:   10/04/20 1722 10/04/20 1737  BP: (!) 92/52 (!) 93/51  Pulse: 85 90  Resp: 18 16  Temp:  36.8 C  SpO2: 92% 91%    Last Pain:  Vitals:   10/04/20 1737  TempSrc:   PainSc: 0-No pain                 Rance Smithson,W. EDMOND

## 2020-10-04 NOTE — Anesthesia Procedure Notes (Signed)
Arterial Line Insertion Start/End4/28/2022 1:00 PM, 10/04/2020 1:05 PM Performed by: CRNA  Patient location: Pre-op. Preanesthetic checklist: patient identified, IV checked, site marked, risks and benefits discussed, surgical consent, monitors and equipment checked, pre-op evaluation, timeout performed and anesthesia consent Lidocaine 1% used for infiltration Left, radial was placed Catheter size: 20 G Hand hygiene performed , maximum sterile barriers used  and Seldinger technique used Allen's test indicative of satisfactory collateral circulation Attempts: 1 Procedure performed without using ultrasound guided technique. Following insertion, dressing applied and Biopatch. Post procedure assessment: normal  Patient tolerated the procedure well with no immediate complications.

## 2020-10-04 NOTE — Transfer of Care (Signed)
Immediate Anesthesia Transfer of Care Note  Patient: Shelly [name]  Procedure(s) Performed: MEMBRANE PEEL (Right Eye) PARS PLANA VITRECTOMY WITH 25 GAUGE, INJECTION OF AVASTIN (Right Eye) PHOTOCOAGULATION WITH LASER (Right Eye) INJECTION OF SILICONE OIL (Right Eye)  Patient Location: PACU  Anesthesia Type:General  Level of Consciousness: drowsy and patient cooperative  Airway & Oxygen Therapy: Patient Spontanous Breathing and Patient connected to face mask oxygen  Post-op Assessment: Report given to RN, Post -op Vital signs reviewed and stable and on Neo gtt  Post vital signs: Reviewed and stable  Last Vitals:  Vitals Value Taken Time  BP 109/61 10/04/20 1707  Temp 36.1 C 10/04/20 1654  Pulse 87 10/04/20 1717  Resp 20 10/04/20 1717  SpO2 93 % 10/04/20 1717  Vitals shown include unvalidated device data.  Last Pain:  Vitals:   10/04/20 1707  TempSrc:   PainSc: 0-No pain      Patients Stated Pain Goal: 0 (10/04/20 1106)  Complications: No complications documented.

## 2020-10-04 NOTE — Anesthesia Procedure Notes (Signed)
Procedure Name: Intubation Date/Time: 10/04/2020 1:45 PM Performed by: Adria Dill, CRNA Pre-anesthesia Checklist: Patient identified, Emergency Drugs available, Suction available and Patient being monitored Patient Re-evaluated:Patient Re-evaluated prior to induction Oxygen Delivery Method: Circle system utilized Preoxygenation: Pre-oxygenation with 100% oxygen Induction Type: IV induction Ventilation: Mask ventilation without difficulty Laryngoscope Size: Miller and 2 Grade View: Grade I Tube type: Oral Tube size: 7.0 mm Number of attempts: 1 Airway Equipment and Method: Stylet and Oral airway Placement Confirmation: ETT inserted through vocal cords under direct vision,  positive ETCO2 and breath sounds checked- equal and bilateral Secured at: 21 cm Tube secured with: Tape Dental Injury: Teeth and Oropharynx as per pre-operative assessment

## 2020-10-05 ENCOUNTER — Encounter (HOSPITAL_COMMUNITY): Payer: Self-pay | Admitting: Ophthalmology

## 2020-10-05 ENCOUNTER — Ambulatory Visit (INDEPENDENT_AMBULATORY_CARE_PROVIDER_SITE_OTHER): Payer: 59 | Admitting: Ophthalmology

## 2020-10-05 DIAGNOSIS — I10 Essential (primary) hypertension: Secondary | ICD-10-CM

## 2020-10-05 DIAGNOSIS — H3343 Traction detachment of retina, bilateral: Secondary | ICD-10-CM

## 2020-10-05 DIAGNOSIS — E113513 Type 2 diabetes mellitus with proliferative diabetic retinopathy with macular edema, bilateral: Secondary | ICD-10-CM

## 2020-10-05 DIAGNOSIS — H3581 Retinal edema: Secondary | ICD-10-CM

## 2020-10-05 DIAGNOSIS — H3342 Traction detachment of retina, left eye: Secondary | ICD-10-CM

## 2020-10-05 DIAGNOSIS — H25813 Combined forms of age-related cataract, bilateral: Secondary | ICD-10-CM

## 2020-10-05 DIAGNOSIS — H35033 Hypertensive retinopathy, bilateral: Secondary | ICD-10-CM

## 2020-10-05 NOTE — Progress Notes (Signed)
[facility] - Clinic Note  10/05/2020     CHIEF COMPLAINT Patient presents for Post-op Follow-up   HISTORY OF PRESENT ILLNESS: Shelly [name] is a 50 y.o. female who presents to the clinic today for:  HPI    Post-op Follow-up    In right eye.  Discomfort includes pain.  I, the attending physician,  performed the HPI with the patient and updated documentation appropriately.          Comments    1 day post op OD- Patient has had a lot of pain.  Feels like the patch is hitting her eye causing the pain.   1 month post op OS- No changes since Monday.         Last edited by [name], MD on 10/05/2020 11:07 PM. (History)    Pt is not feeling well this morning, she is nauseous and her eye is in a lot of pain  Referring physician: Dema Severin, NP [address] [place],  [place] [address]  HISTORICAL INFORMATION:   Selected notes from the MEDICAL RECORD NUMBER Referred by Dr. Aletta Edouard for concern of decreased [facility] OU   CURRENT MEDICATIONS: Current Outpatient Medications (Ophthalmic Drugs)  Medication Sig  . prednisoLONE acetate (PRED FORTE) 1 % ophthalmic suspension Place 1 drop into the left eye 4 (four) times daily.   No current facility-administered medications for this visit. (Ophthalmic Drugs)   Current Outpatient Medications (Other)  Medication Sig  . aspirin EC 81 MG tablet Take 1 tablet (81 mg total) by mouth daily.  Marland Kitchen atorvastatin (LIPITOR) 80 MG tablet Take 1 tablet (80 mg total) by mouth daily.  . carvedilol (COREG) 3.125 MG tablet Take 1 tablet (3.125 mg total) by mouth 2 (two) times daily.  . digoxin (LANOXIN) 0.125 MG tablet Take 1 tablet (125 mcg total) by mouth daily.  . empagliflozin (JARDIANCE) 25 MG TABS tablet Take 1 tablet (25 mg total) by mouth daily.  . furosemide (LASIX) 20 MG tablet Take 20 mg by mouth daily.  . hydrOXYzine (ATARAX/VISTARIL) 25 MG tablet Take 25 mg by mouth 2 (two) times daily as needed for anxiety (sleep).  .  metFORMIN (GLUCOPHAGE) 1000 MG tablet Take 1,000 mg by mouth in the morning and at bedtime.  . potassium chloride (KLOR-CON) 10 MEQ tablet Take 4 tablets (40 mEq total) by mouth daily. (Patient taking differently: Take 20 mEq by mouth daily.)  . sacubitril-valsartan (ENTRESTO) 97-103 MG Take 1 tablet by mouth 2 (two) times daily.  Marland Kitchen spironolactone (ALDACTONE) 25 MG tablet Take 1 tablet (25 mg total) by mouth daily.  . TRULICITY 3 MG/0.5ML SOPN Inject 3 mg into the skin every Sunday.  . vitamin B-12 (CYANOCOBALAMIN) 1000 MCG tablet Take 1,000 mcg by mouth daily.   No current facility-administered medications for this visit. (Other)      REVIEW OF SYSTEMS: ROS    Positive for: Endocrine, Cardiovascular, Eyes   Negative for: Constitutional, Gastrointestinal, Neurological, Skin, Genitourinary, Musculoskeletal, HENT, Respiratory, Psychiatric, Allergic/Imm, Heme/Lymph   Last edited by Joni Reining, COA on 10/05/2020  8:24 AM. (History)       ALLERGIES No Known Allergies  PAST MEDICAL HISTORY Past Medical History:  Diagnosis Date  . Anemia   . Anxiety   . Arthritis   . Cataract    Mixed OU  . CHF (congestive heart failure) (HCC)   . Coronary artery disease   . Diabetes mellitus without complication (HCC)   . Diabetic retinopathy (  HCC)    PDR OU  . Headache    Cluster headaches in the past  . History of kidney stones    "2 sitting"  . Hypertension   . Hypertensive retinopathy    OU  . Myocardial infarction (HCC)   . Pneumonia   . Tobacco abuse    Past Surgical History:  Procedure Laterality Date  . EYE SURGERY Left 08/23/2020   PPV - Dr. [name]  . INJECTION OF SILICONE OIL Left 08/23/2020   Procedure: INJECTION OF SILICONE OIL;  Surgeon: [name], MD;  Location: [facility] OR;  Service: Ophthalmology;  Laterality: Left;  . INJECTION OF SILICONE OIL Right 10/04/2020   Procedure: INJECTION OF SILICONE OIL;  Surgeon: [name], MD;  Location: [facility] OR;  Service:  Ophthalmology;  Laterality: Right;  . MEMBRANE PEEL Left 08/23/2020   Procedure: MEMBRANE PEEL;  Surgeon: [name], MD;  Location: [facility] OR;  Service: Ophthalmology;  Laterality: Left;  . MEMBRANE PEEL Right 10/04/2020   Procedure: MEMBRANE PEEL;  Surgeon: [name], MD;  Location: [facility] OR;  Service: Ophthalmology;  Laterality: Right;  . PARS PLANA VITRECTOMY Left 08/23/2020   Procedure: PARS PLANA VITRECTOMY WITH 25 GAUGE WITH ENDOLASER;  Surgeon: [name], MD;  Location: [facility] OR;  Service: Ophthalmology;  Laterality: Left;  . PARS PLANA VITRECTOMY Right 10/04/2020   Procedure: PARS PLANA VITRECTOMY WITH 25 GAUGE, INJECTION OF AVASTIN;  Surgeon: [name], MD;  Location: [facility] OR;  Service: Ophthalmology;  Laterality: Right;  . PHOTOCOAGULATION WITH LASER Right 10/04/2020   Procedure: PHOTOCOAGULATION WITH LASER;  Surgeon: [name], MD;  Location: [facility] OR;  Service: Ophthalmology;  Laterality: Right;  . 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/23/2020   Procedure: REPAIR OF COMPLEX TRACTION RETINAL DETACHMENT;  Surgeon: [name], MD;  Location: [facility] OR;  Service: Ophthalmology;  Laterality: Left;  . RIGHT/LEFT HEART CATH AND CORONARY ANGIOGRAPHY N/A 10/14/2019   Procedure: RIGHT/LEFT HEART CATH AND CORONARY ANGIOGRAPHY;  Surgeon: Dolores Patty, MD;  Location: [facility] INVASIVE CV LAB;  Service: Cardiovascular;  Laterality: N/A;    FAMILY HISTORY Family History  Problem Relation Age of Onset  . Sudden Cardiac Death Mother   . CAD Father   . CAD Paternal Grandmother   . CAD Paternal Grandfather     SOCIAL HISTORY Social History   Tobacco Use  . Smoking status: Current Every Day Smoker    Packs/day: 1.50  . Smokeless tobacco: Never Used  Vaping Use  . Vaping Use: Never used  Substance Use Topics  . Alcohol use: Not Currently  . Drug use: Never         OPHTHALMIC EXAM:  Base Eye Exam    Visual Acuity (Snellen - Linear)      Right Left   Dist [place] HM CF 2'        Tonometry (Tonopen, 8:32 AM)      Right Left   Pressure 17 15       Pupils      Dark Light Shape React APD   Right 5 5 Round NR    Left 4 3 Round Minimal None  Dilated OD       Neuro/Psych    Oriented x3: Yes   Mood/Affect: Normal       Dilation    Right eye: dilated @ 8:32 AM        Slit Lamp and Fundus Exam    Slit Lamp Exam      Right Left  Lids/Lashes Dermatochalasis - upper lid, Meibomian gland dysfunction Dermatochalasis - upper lid, Meibomian gland dysfunction   Conjunctiva/Sclera Subconjunctival hemorrhage, temporal Chemosis, sutures intact White and quiet, sutures dissolving, focal injection at suture sites   Cornea Central epi defect, 3+ Descemet's folds 1+ inferior Punctate epithelial erosions, trace Descemet's folds   Anterior Chamber moderate depth, narrow angles deep, clear, narrow angles   Iris Round and reactive Round and dilated, No NVI   Lens 1-2+ Nuclear sclerosis, 2+ Cortical cataract 2+ Nuclear sclerosis, 2+ Cortical cataract, 1+Posterior subcapsular cataract   Vitreous post vitrectomy, good oil fill post vitrectomy, 90-95% silicone oil fill       Fundus Exam      Right Left   Disc mild Pallor, massive fibrosis along superior disc, regressed NVD Pink and Sharp, Compact   C/D Ratio  0.2   Macula Hazy view, fibrosis improved Flat under oil, scattered DBH, fibrosis vastly improved, shallow residual SRF inferiorly   Vessels attenuated, Tortuous, +NV 360 -- regressed attenuated, Tortuous   Periphery Hazy view, grossly attached, 360 PRP attached, fibrosis improved, 360 PRP, scattered IRH; ORIGINALLY: Extensive fibrosis, +TRD, boat shaped subhyaloid heme inferiorly          IMAGING AND PROCEDURES  Imaging and Procedures for 10/05/2020           ASSESSMENT/PLAN:    ICD-10-CM   1. Proliferative diabetic retinopathy of both eyes with macular edema associated with type 2 diabetes mellitus (HCC)  O53.6644   2. Retinal edema  H35.81   3.  Retinal detachment, tractional, bilateral  H33.43   4. Essential hypertension  I10   5. Hypertensive retinopathy of both eyes  H35.033   6. Combined forms of age-related cataract of both eyes  H25.813   7. Retinal detachment, tractional, left  H33.42     1-3. Severe proliferative diabetic retinopathy with edema OU  - FA (02.04.22) shows florid NV with leakage and pooling OU (OS > OD)   - OD: severe tractional fibrosis, edema, and TRD  - OS:  With TRD and subhyaloid heme             - s/p IVA OD #1 (02.08.22), #2 (03.08.22)  - s/p IVE OS #1 (02.04.22) -- sample, #2 (03.08.22) -- sample  - s/p PRP OD (02.04.22)             - s/p PRP OS (2.8.22)  - exam shows regression of NV but consolidation of fibrosis and TRD OU - OCT shows severe tractional fibrosis and edema OU, now TRD OU - discussed findings and severity of disease, guarded prognosis and extensive treatments and follow ups need to stabilize eyes and improve vision -- will likely need surgery OU  - POW4 s/p PPV/POC/EL/FAX/SO + IVA OS, 03.17.2022             - doing well -- pt reports subjective improvement in vision OS             - fibrosis improved, but OCT shows shallow residual SRF inferiorly             - IOP 15 today             - cont   PF 4x/day OS                         Brimonidine BID OS  PSO ung QID OS   - now POD1 s/p PPV/POC/EL/FAX/SO + IVA OD, 04.28.2022             - doing ok this morning             - good oil fill place             - IOP good at 17             - start PF 4x/day OD                         zymaxid QID OD                          Atropine BID OD   Brimonidine BID OD                         PSO ung QID OD              - cont face down positioning x3 days then can decrease positioning to 50% of time; avoid laying flat on back              - eye shield when sleeping              - post op drop and positioning instructions reviewed              - tylenol/ibuprofen for  pain  - f/u Thursday May 6 at 8:30, DFE   4,5. Hypertensive retinopathy OU - discussed importance of tight BP control - monitor  6. Mixed Cataract OU - The symptoms of cataract, surgical options, and treatments and risks were discussed with patient. - discussed diagnosis and progression - not yet visually significant - monitor for now  Ophthalmic Meds Ordered this visit:  No orders of the defined types were placed in this encounter.     Return in about 1 week (around 10/12/2020).  There are no Patient Instructions on file for this visit.  This document serves as a record of services personally performed by Karie Chimera, MD, PhD. It was created on their behalf by Glee Arvin. Manson Passey, OA an ophthalmic technician. The creation of this record is the provider's dictation and/or activities during the visit.    Electronically signed by: Glee Arvin. Manson Passey, [place] 04.29.2022 11:11 PM  Karie Chimera, M.D., Ph.D. Diseases & Surgery of the Retina and Vitreous [facility]  I have reviewed the above documentation for accuracy and completeness, and I agree with the above. Karie Chimera, M.D., Ph.D. 10/05/20 11:11 PM  Abbreviations: M myopia (nearsighted); A astigmatism; H hyperopia (farsighted); P presbyopia; Mrx spectacle prescription;  CTL contact lenses; OD right eye; OS left eye; OU both eyes  XT exotropia; ET esotropia; PEK punctate epithelial keratitis; PEE punctate epithelial erosions; DES dry eye syndrome; MGD meibomian gland dysfunction; ATs artificial tears; PFAT's preservative free artificial tears; NSC nuclear sclerotic cataract; PSC posterior subcapsular cataract; ERM epi-retinal membrane; PVD posterior vitreous detachment; RD retinal detachment; DM diabetes mellitus; DR diabetic retinopathy; NPDR non-proliferative diabetic retinopathy; PDR proliferative diabetic retinopathy; CSME clinically significant macular edema; DME diabetic macular edema; dbh dot blot  hemorrhages; CWS cotton wool spot; POAG primary open angle glaucoma; C/D cup-to-disc ratio; HVF humphrey visual field; GVF goldmann visual field; OCT optical coherence tomography; IOP intraocular pressure; BRVO Branch retinal vein occlusion; CRVO central retinal  vein occlusion; CRAO central retinal artery occlusion; BRAO branch retinal artery occlusion; RT retinal tear; SB scleral buckle; PPV pars plana vitrectomy; VH Vitreous hemorrhage; PRP panretinal laser photocoagulation; IVK intravitreal kenalog; VMT vitreomacular traction; MH Macular hole;  NVD neovascularization of the disc; NVE neovascularization elsewhere; AREDS age related eye disease study; ARMD age related macular degeneration; POAG primary open angle glaucoma; EBMD epithelial/anterior basement membrane dystrophy; ACIOL anterior chamber intraocular lens; IOL intraocular lens; PCIOL posterior chamber intraocular lens; Phaco/IOL phacoemulsification with intraocular lens placement; Algood photorefractive keratectomy; LASIK laser assisted in situ keratomileusis; HTN hypertension; DM diabetes mellitus; COPD chronic obstructive pulmonary disease

## 2020-10-10 NOTE — Progress Notes (Signed)
[facility] Note  10/11/2020     CHIEF COMPLAINT Patient presents for Retina Follow Up   HISTORY OF PRESENT ILLNESS: Shelly [name] is a 50 y.o. female who presents to the clinic today for:  HPI    Retina Follow Up    Patient presents with  Other.  In right eye.  This started 6 days ago.  I, the attending physician,  performed the HPI with the patient and updated documentation appropriately.          Comments    Patient here for 6 days follow up for PO OD. Patient states vision is ok. Not where wants it to be. Hard to tell of seeing better. Sometimes has eye pain- mostly sore from surgery. Tender to touch OD.       Last edited by Bernarda Caffey, MD on 10/16/2020 12:38 AM. (History)    Pt states this week went "pretty good", she states her left eye is stable and right eye is still tender to the touch  Referring physician: Imagene Riches, NP [place],  [place] [address]  HISTORICAL INFORMATION:   Selected notes from the MEDICAL RECORD NUMBER Referred by Dr. Maryjane Hurter for concern of decreased VA OU   CURRENT MEDICATIONS: Current Outpatient Medications (Ophthalmic Drugs)  Medication Sig  . prednisoLONE acetate (PRED FORTE) 1 % ophthalmic suspension Place 1 drop into the left eye 4 (four) times daily.   No current facility-administered medications for this visit. (Ophthalmic Drugs)   Current Outpatient Medications (Other)  Medication Sig  . atorvastatin (LIPITOR) 80 MG tablet Take 1 tablet (80 mg total) by mouth daily.  . carvedilol (COREG) 3.125 MG tablet Take 1 tablet (3.125 mg total) by mouth 2 (two) times daily.  . digoxin (LANOXIN) 0.125 MG tablet Take 1 tablet (125 mcg total) by mouth daily.  . empagliflozin (JARDIANCE) 25 MG TABS tablet Take 1 tablet (25 mg total) by mouth daily.  . furosemide (LASIX) 20 MG tablet Take 20 mg by mouth daily.  . hydrOXYzine (ATARAX/VISTARIL) 25 MG tablet Take 25 mg by mouth 2 (two) times daily as needed  for anxiety (sleep).  . metFORMIN (GLUCOPHAGE) 1000 MG tablet Take 1,000 mg by mouth in the morning and at bedtime.  . potassium chloride (KLOR-CON) 10 MEQ tablet Take 4 tablets (40 mEq total) by mouth daily. (Patient taking differently: Take 20 mEq by mouth daily.)  . sacubitril-valsartan (ENTRESTO) 97-103 MG Take 1 tablet by mouth 2 (two) times daily.  Marland Kitchen spironolactone (ALDACTONE) 25 MG tablet Take 1 tablet (25 mg total) by mouth daily.  . TRULICITY 3 YI/9.4WN SOPN Inject 3 mg into the skin every Sunday.  . vitamin B-12 (CYANOCOBALAMIN) 1000 MCG tablet Take 1,000 mcg by mouth daily.   No current facility-administered medications for this visit. (Other)      REVIEW OF SYSTEMS: ROS    Positive for: Endocrine, Cardiovascular, Eyes   Negative for: Constitutional, Gastrointestinal, Neurological, Skin, Genitourinary, Musculoskeletal, HENT, Respiratory, Psychiatric, Allergic/Imm, Heme/Lymph   Last edited by Theodore Demark, COA on 10/11/2020  8:35 AM. (History)       ALLERGIES No Known Allergies  PAST MEDICAL HISTORY Past Medical History:  Diagnosis Date  . Anemia   . Anxiety   . Arthritis   . Cataract    Mixed OU  . CHF (congestive heart failure) ([place])   . Coronary artery disease   . Diabetes mellitus without complication ([place])   . Diabetic  retinopathy ([place])    PDR OU  . Headache    Cluster headaches in the past  . History of kidney stones    "2 sitting"  . Hypertension   . Hypertensive retinopathy    OU  . Myocardial infarction ([place])   . Pneumonia   . Tobacco abuse    Past Surgical History:  Procedure Laterality Date  . EYE SURGERY Left 08/23/2020   PP[facility] - Dr. Bernarda Caffey  . INJECTION OF SILICONE OIL Left 5/63/1497   Procedure: INJECTION OF SILICONE OIL;  Surgeon: Bernarda Caffey, MD;  Location: [facility];  Service: Ophthalmology;  Laterality: Left;  . INJECTION OF SILICONE OIL Right 0/26/3785   Procedure: INJECTION OF SILICONE OIL;  Surgeon: Bernarda Caffey, MD;   Location: [facility];  Service: Ophthalmology;  Laterality: Right;  . MEMBRANE PEEL Left 08/23/2020   Procedure: MEMBRANE PEEL;  Surgeon: Bernarda Caffey, MD;  Location: [facility];  Service: Ophthalmology;  Laterality: Left;  . MEMBRANE PEEL Right 10/04/2020   Procedure: MEMBRANE PEEL;  Surgeon: Bernarda Caffey, MD;  Location: [facility];  Service: Ophthalmology;  Laterality: Right;  . PARS PLANA VITRECTOMY Left 08/23/2020   Procedure: PARS PLANA VITRECTOMY WITH 25 GAUGE WITH ENDOLASER;  Surgeon: Bernarda Caffey, MD;  Location: [facility];  Service: Ophthalmology;  Laterality: Left;  . PARS PLANA VITRECTOMY Right 10/04/2020   Procedure: PARS PLANA VITRECTOMY WITH 25 GAUGE, INJECTION OF AVASTIN;  Surgeon: Bernarda Caffey, MD;  Location: [facility];  Service: Ophthalmology;  Laterality: Right;  . PHOTOCOAGULATION WITH LASER Right 10/04/2020   Procedure: PHOTOCOAGULATION WITH LASER;  Surgeon: Bernarda Caffey, MD;  Location: [facility];  Service: Ophthalmology;  Laterality: Right;  . 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/23/2020   Procedure: REPAIR OF COMPLEX TRACTION RETINAL DETACHMENT;  Surgeon: Bernarda Caffey, MD;  Location: [facility];  Service: Ophthalmology;  Laterality: Left;  . RIGHT/LEFT HEART CATH AND CORONARY ANGIOGRAPHY N/A 10/14/2019   Procedure: RIGHT/LEFT HEART CATH AND CORONARY ANGIOGRAPHY;  Surgeon: Jolaine Artist, MD;  Location: [facility];  Service: Cardiovascular;  Laterality: N/A;    FAMILY HISTORY Family History  Problem Relation Age of Onset  . Sudden Cardiac Death Mother   . CAD Father   . CAD Paternal Grandmother   . CAD Paternal Grandfather     SOCIAL HISTORY Social History   Tobacco Use  . Smoking status: Current Every Day Smoker    Packs/day: 1.50  . Smokeless tobacco: Never Used  Vaping Use  . Vaping Use: Never used  Substance Use Topics  . Alcohol use: Not Currently  . Drug use: Never         OPHTHALMIC EXAM:  Base Eye Exam    Visual Acuity (Snellen - Linear)       Right Left   Dist [place] CF at 2' 20/400 -1   Dist ph [place] NI NI       Tonometry (Tonopen, 8:31 AM)      Right Left   Pressure 14        Pupils      Dark Light Shape React APD   Right 5 5 Round NR None   Left 4 3 Round Minimal None  OD Dilated       Neuro/Psych    Oriented x3: Yes   Mood/Affect: Normal       Dilation    Right eye: 1.0% Mydriacyl, 2.5% Phenylephrine @ 8:31 AM        Slit Lamp and Fundus Exam  Slit Lamp Exam      Right Left   Lids/Lashes Dermatochalasis - upper lid, Meibomian gland dysfunction Dermatochalasis - upper lid, Meibomian gland dysfunction   Conjunctiva/Sclera Subconjunctival hemorrhage -- improved, sutures intact White and quiet, sutures dissolving, focal injection at suture sites   Cornea Central epi defect -- closed, 2+ fine Punctate epithelial erosions 1+ inferior Punctate epithelial erosions   Anterior Chamber moderate depth, narrow angles deep, clear, narrow angles   Iris Round and reactive Round and dilated, No NVI   Lens 1-2+ Nuclear sclerosis, 2+ Cortical cataract, 1+PC feathering 2+ Nuclear sclerosis, 2+ Cortical cataract, 1+Posterior subcapsular cataract   Vitreous post vitrectomy, 90-95% oil fill post vitrectomy, 30-07% silicone oil fill       Fundus Exam      Right Left   Disc mild Pallor, massive fibrosis improved, +heme inferiorly Pink and Sharp, Compact   C/D Ratio  0.2   Macula grossly flat under oil, fibrosis improved, residual pre-retinal heme ST mac Flat under oil, scattered DBH, fibrosis vastly improved, shallow residual SRF inferiorly   Vessels severe attenuation, tortuousity attenuated, Tortuous   Periphery Hazy view, grossly attached, 360 PRP, shallow SRF inferior periphery attached, fibrosis improved, 360 PRP, scattered IRH; ORIGINALLY: Extensive fibrosis, +TRD, boat shaped subhyaloid heme inferiorly          IMAGING AND PROCEDURES  Imaging and Procedures for 10/11/2020           ASSESSMENT/PLAN:    ICD-10-CM    1. Proliferative diabetic retinopathy of both eyes with macular edema associated with type 2 diabetes mellitus ([place])  M22.6333   2. Retinal edema  H35.81   3. Retinal detachment, tractional, bilateral  H33.43   4. Essential hypertension  I10   5. Hypertensive retinopathy of both eyes  H35.033   6. Combined forms of age-related cataract of both eyes  H25.813     1-3. Severe proliferative diabetic retinopathy with edema OU  - FA (02.04.22) shows florid NV with leakage and pooling OU (OS > OD)   - OD: severe tractional fibrosis, edema, and TRD  - OS:  With TRD and subhyaloid heme             - s/p IVA OD #1 (02.08.22), #2 (03.08.22)  - s/p IVE OS #1 (02.04.22) -- sample, #2 (03.08.22) -- sample  - s/p PRP OD (02.04.22)             - s/p PRP OS (2.8.22)  - exam shows regression of NV but consolidation of fibrosis and TRD OU - OCT shows severe tractional fibrosis and edema OU, now TRD OU - discussed findings and severity of disease, guarded prognosis and extensive treatments and follow ups need to stabilize eyes and improve vision -- will likely need surgery OU  - POW4 s/p PPV/POC/EL/FAX/SO + IVA OS, 03.17.2022             - doing well -- pt reports subjective improvement in vision OS             - fibrosis improved, but OCT shows shallow residual SRF inferiorly             - IOP not checked today             - cont   PF 4x/day OS                         Brimonidine BID OS  PSO ung QID OS   - now POW1 s/p PPV/POC/EL/FAX/SO + IVA OD, 04.28.2022             - doing ok this morning             - good oil fill place             - IOP good at 14             - cont PF 4x/day OD                         zymaxid QID OD -- stop on Saturday                         Atropine BID OD -- okay to stop   Brimonidine BID OD                         PSO ung QID OD              - cont face down positioning; avoid laying flat on back              - eye shield when sleeping               - post op drop and positioning instructions reviewed              - tylenol/ibuprofen for pain  - f/u 3 weeks, DFE OU, OCT   4,5. Hypertensive retinopathy OU - discussed importance of tight BP control - monitor  6. Mixed Cataract OU - The symptoms of cataract, surgical options, and treatments and risks were discussed with patient. - discussed diagnosis and progression - not yet visually significant - monitor for now  Ophthalmic Meds Ordered this visit:  No orders of the defined types were placed in this encounter.     Return in about 3 weeks (around 11/01/2020) for f/u NPDR OU, DFE, OCT.  There are no Patient Instructions on file for this visit.  This document serves as a record of services personally performed by Gardiner Sleeper, MD, PhD. It was created on their behalf by Leonie Douglas, an ophthalmic technician. The creation of this record is the provider's dictation and/or activities during the visit.    Electronically signed by: Leonie Douglas COA, 10/16/20  12:43 AM   This document serves as a record of services personally performed by Gardiner Sleeper, MD, PhD. It was created on their behalf by San Jetty. Owens Shark, OA an ophthalmic technician. The creation of this record is the provider's dictation and/or activities during the visit.    Electronically signed by: San Jetty. Owens Shark, [place] 05.05.2022 12:43 AM  Gardiner Sleeper, M.D., Ph.D. Diseases & Surgery of the Retina and Vitreous [facility] 10/11/20  I have reviewed the above documentation for accuracy and completeness, and I agree with the above. Gardiner Sleeper, M.D., Ph.D. 10/16/20 12:43 AM   Abbreviations: M myopia (nearsighted); A astigmatism; H hyperopia (farsighted); P presbyopia; Mrx spectacle prescription;  CTL contact lenses; OD right eye; OS left eye; OU both eyes  XT exotropia; ET esotropia; PEK punctate epithelial keratitis; PEE punctate epithelial erosions; DES dry eye syndrome; MGD meibomian  gland dysfunction; ATs artificial tears; PFAT's preservative free artificial tears; [place] nuclear sclerotic cataract; PSC posterior subcapsular cataract; ERM epi-retinal membrane; PVD posterior vitreous detachment; RD retinal detachment; DM  diabetes mellitus; DR diabetic retinopathy; NPDR non-proliferative diabetic retinopathy; PDR proliferative diabetic retinopathy; CSME clinically significant macular edema; DME diabetic macular edema; dbh dot blot hemorrhages; CWS cotton wool spot; POAG primary open angle glaucoma; C/D cup-to-disc ratio; HVF humphrey visual field; GVF goldmann visual field; OCT optical coherence tomography; IOP intraocular pressure; BRVO Branch retinal vein occlusion; CRVO central retinal vein occlusion; CRAO central retinal artery occlusion; BRAO branch retinal artery occlusion; RT retinal tear; SB scleral buckle; PPV pars plana vitrectomy; VH Vitreous hemorrhage; PRP panretinal laser photocoagulation; IVK intravitreal kenalog; VMT vitreomacular traction; MH Macular hole;  NVD neovascularization of the disc; NVE neovascularization elsewhere; AREDS age related eye disease study; ARMD age related macular degeneration; POAG primary open angle glaucoma; EBMD epithelial/anterior basement membrane dystrophy; ACIOL anterior chamber intraocular lens; IOL intraocular lens; PCIOL posterior chamber intraocular lens; Phaco/IOL phacoemulsification with intraocular lens placement; [place] photorefractive keratectomy; LASIK laser assisted in situ keratomileusis; HTN hypertension; DM diabetes mellitus; COPD chronic obstructive pulmonary disease

## 2020-10-11 ENCOUNTER — Other Ambulatory Visit: Payer: Self-pay

## 2020-10-11 ENCOUNTER — Encounter (INDEPENDENT_AMBULATORY_CARE_PROVIDER_SITE_OTHER): Payer: Self-pay | Admitting: Ophthalmology

## 2020-10-11 ENCOUNTER — Ambulatory Visit (INDEPENDENT_AMBULATORY_CARE_PROVIDER_SITE_OTHER): Payer: 59 | Admitting: Ophthalmology

## 2020-10-11 DIAGNOSIS — H25813 Combined forms of age-related cataract, bilateral: Secondary | ICD-10-CM

## 2020-10-11 DIAGNOSIS — H3581 Retinal edema: Secondary | ICD-10-CM

## 2020-10-11 DIAGNOSIS — E113513 Type 2 diabetes mellitus with proliferative diabetic retinopathy with macular edema, bilateral: Secondary | ICD-10-CM

## 2020-10-11 DIAGNOSIS — H3343 Traction detachment of retina, bilateral: Secondary | ICD-10-CM

## 2020-10-11 DIAGNOSIS — H35033 Hypertensive retinopathy, bilateral: Secondary | ICD-10-CM

## 2020-10-11 DIAGNOSIS — I10 Essential (primary) hypertension: Secondary | ICD-10-CM

## 2020-10-16 ENCOUNTER — Encounter (INDEPENDENT_AMBULATORY_CARE_PROVIDER_SITE_OTHER): Payer: Self-pay | Admitting: Ophthalmology

## 2020-11-01 ENCOUNTER — Encounter (HOSPITAL_COMMUNITY): Payer: 59 | Admitting: Internal Medicine

## 2020-11-01 ENCOUNTER — Ambulatory Visit (HOSPITAL_COMMUNITY): Payer: 59

## 2020-11-06 NOTE — Progress Notes (Signed)
[facility] Note  11/07/2020     CHIEF COMPLAINT Patient presents for Post-op Follow-up   HISTORY OF PRESENT ILLNESS: Shelly [name] is a 50 y.o. female who presents to the clinic today for:    HPI    Post-op Follow-up    In both eyes.  Vision is stable.  I, the attending physician,  performed the HPI with the patient and updated documentation appropriately.          Comments    Pt states vision is the same.  States she is not seeing any improvement.  States she is very sensitive to light.  Denies eye pain.       Last edited by Bernarda Caffey, MD on 11/09/2020 11:56 PM. (History)    Pt states "some days are better than others" with her vision, she states today is "a little bit tough", she has a hard time adjusting to light going from outside to inside, she states day to day, she feels like her vision is a little better, she is using PF and brimonidine OU   Referring physician: Imagene Riches, NP [place],  [place] [address]  HISTORICAL INFORMATION:   Selected notes from the MEDICAL RECORD NUMBER Referred by Dr. Maryjane Hurter for concern of decreased VA OU   CURRENT MEDICATIONS: Current Outpatient Medications (Ophthalmic Drugs)  Medication Sig  . ketorolac (ACULAR) 0.5 % ophthalmic solution Place 1 drop into both eyes 4 (four) times daily.  . prednisoLONE acetate (PRED FORTE) 1 % ophthalmic suspension Place 1 drop into the left eye 4 (four) times daily.   No current facility-administered medications for this visit. (Ophthalmic Drugs)   Current Outpatient Medications (Other)  Medication Sig  . levofloxacin (LEVAQUIN) 500 MG tablet Take 500 mg by mouth daily.  Marland Kitchen atorvastatin (LIPITOR) 80 MG tablet Take 1 tablet (80 mg total) by mouth daily.  . carvedilol (COREG) 3.125 MG tablet Take 1 tablet (3.125 mg total) by mouth 2 (two) times daily.  . digoxin (LANOXIN) 0.125 MG tablet Take 1 tablet (125 mcg total) by mouth daily.  . empagliflozin  (JARDIANCE) 25 MG TABS tablet Take 1 tablet (25 mg total) by mouth daily.  . furosemide (LASIX) 20 MG tablet Take 20 mg by mouth daily.  . hydrOXYzine (ATARAX/VISTARIL) 25 MG tablet Take 25 mg by mouth 2 (two) times daily as needed for anxiety (sleep).  . metFORMIN (GLUCOPHAGE) 1000 MG tablet Take 1,000 mg by mouth in the morning and at bedtime.  . potassium chloride (KLOR-CON) 10 MEQ tablet Take 4 tablets (40 mEq total) by mouth daily. (Patient taking differently: Take 20 mEq by mouth daily.)  . sacubitril-valsartan (ENTRESTO) 97-103 MG Take 1 tablet by mouth 2 (two) times daily.  Marland Kitchen spironolactone (ALDACTONE) 25 MG tablet Take 1 tablet (25 mg total) by mouth daily.  . TRULICITY 3 VF/6.4PP SOPN Inject 3 mg into the skin every Sunday.  . vitamin B-12 (CYANOCOBALAMIN) 1000 MCG tablet Take 1,000 mcg by mouth daily.   No current facility-administered medications for this visit. (Other)      REVIEW OF SYSTEMS: ROS    Positive for: Endocrine, Cardiovascular, Eyes   Negative for: Constitutional, Gastrointestinal, Neurological, Skin, Genitourinary, Musculoskeletal, HENT, Respiratory, Psychiatric, Allergic/Imm, Heme/Lymph   Last edited by Doneen Poisson on 11/07/2020  8:13 AM. (History)       ALLERGIES No Known Allergies  PAST MEDICAL HISTORY Past Medical History:  Diagnosis Date  . Anemia   .  Anxiety   . Arthritis   . Cataract    Mixed OU  . CHF (congestive heart failure) ([place])   . Coronary artery disease   . Diabetes mellitus without complication ([place])   . Diabetic retinopathy ([place])    PDR OU  . Headache    Cluster headaches in the past  . History of kidney stones    "2 sitting"  . Hypertension   . Hypertensive retinopathy    OU  . Myocardial infarction ([place])   . Pneumonia   . Tobacco abuse    Past Surgical History:  Procedure Laterality Date  . EYE SURGERY Left 08/23/2020   PPV - Dr. Bernarda Caffey  . INJECTION OF SILICONE OIL Left 0/35/4656   Procedure: INJECTION OF  SILICONE OIL;  Surgeon: Bernarda Caffey, MD;  Location: [facility];  Service: Ophthalmology;  Laterality: Left;  . INJECTION OF SILICONE OIL Right 01/19/7516   Procedure: INJECTION OF SILICONE OIL;  Surgeon: Bernarda Caffey, MD;  Location: [facility] [facility];  Service: Ophthalmology;  Laterality: Right;  . MEMBRANE PEEL Left 08/23/2020   Procedure: MEMBRANE PEEL;  Surgeon: Bernarda Caffey, MD;  Location: [facility];  Service: Ophthalmology;  Laterality: Left;  . MEMBRANE PEEL Right 10/04/2020   Procedure: MEMBRANE PEEL;  Surgeon: Bernarda Caffey, MD;  Location: [facility];  Service: Ophthalmology;  Laterality: Right;  . PARS PLANA VITRECTOMY Left 08/23/2020   Procedure: PARS PLANA VITRECTOMY WITH 25 GAUGE WITH ENDOLASER;  Surgeon: Bernarda Caffey, MD;  Location: [facility];  Service: Ophthalmology;  Laterality: Left;  . PARS PLANA VITRECTOMY Right 10/04/2020   Procedure: PARS PLANA VITRECTOMY WITH 25 GAUGE, INJECTION OF AVASTIN;  Surgeon: Bernarda Caffey, MD;  Location: [facility];  Service: Ophthalmology;  Laterality: Right;  . PHOTOCOAGULATION WITH LASER Right 10/04/2020   Procedure: PHOTOCOAGULATION WITH LASER;  Surgeon: Bernarda Caffey, MD;  Location: [facility];  Service: Ophthalmology;  Laterality: Right;  . 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/23/2020   Procedure: REPAIR OF COMPLEX TRACTION RETINAL DETACHMENT;  Surgeon: Bernarda Caffey, MD;  Location: [facility];  Service: Ophthalmology;  Laterality: Left;  . RIGHT/LEFT HEART CATH AND CORONARY ANGIOGRAPHY N/A 10/14/2019   Procedure: RIGHT/LEFT HEART CATH AND CORONARY ANGIOGRAPHY;  Surgeon: Jolaine Artist, MD;  Location: [facility];  Service: Cardiovascular;  Laterality: N/A;    FAMILY HISTORY Family History  Problem Relation Age of Onset  . Sudden Cardiac Death Mother   . CAD Father   . CAD Paternal Grandmother   . CAD Paternal Grandfather     SOCIAL HISTORY Social History   Tobacco Use  . Smoking status: Current Every Day Smoker    Packs/day: 1.50  . Smokeless  tobacco: Never Used  Vaping Use  . Vaping Use: Never used  Substance Use Topics  . Alcohol use: Not Currently  . Drug use: Never         OPHTHALMIC EXAM:  Base Eye Exam    Visual Acuity (Snellen - Linear)      Right Left   Dist cc CF @ face CF @ face   Dist ph cc NI NI       Tonometry (Tonopen, 8:26 AM)      Right Left   Pressure 17 16       Pupils      Dark Light Shape React APD   Right 6 6 Round NR 0   Left 5 4 Round Brisk 0       Visual Fields  Left Right   Restrictions Partial outer superior temporal, inferior temporal, superior nasal, inferior nasal deficiencies Partial outer superior temporal, inferior temporal, superior nasal, inferior nasal deficiencies       Extraocular Movement      Right Left    Full Full       Neuro/Psych    Oriented x3: Yes   Mood/Affect: Normal       Dilation    Both eyes: 1.0% Mydriacyl, 2.5% Phenylephrine @ 8:26 AM        Slit Lamp and Fundus Exam    Slit Lamp Exam      Right Left   Lids/Lashes Dermatochalasis - upper lid Dermatochalasis - upper lid   Conjunctiva/Sclera White and quiet, sutures intact White and quiet, sutures dissolved   Cornea 2-3+ Punctate epithelial erosions, no epi defect 1+ inferior Punctate epithelial erosions   Anterior Chamber deep, narrow angles deep, clear, narrow angles   Iris Round and dilated Round and dilated, No NVI   Lens 1-2+ Nuclear sclerosis, 2+ Cortical cataract, 1+ Posterior subcapsular cataract 2+ Nuclear sclerosis, 2+ Cortical cataract, 1+Posterior subcapsular cataract   Vitreous post vitrectomy, 90-95% oil fill post vitrectomy, 60-45% silicone oil fill       Fundus Exam      Right Left   Disc mild Pallor, massive fibrosis gone, Sharp rim Pink and Sharp, Compact, +fibrosis greatest temporal disc   C/D Ratio 0.2 0.2   Macula grossly flat under oil, fibrosis improved, residual pre-retinal heme ST mac -- improving Flat under oil, scattered DBH, fibrosis vastly improved, shallow  residual SRF inferiorly along IT arcades along with ERM and fibrosis   Vessels severe attenuation, tortuous attenuated, Tortuous   Periphery Attached under oil, 360 PRP, shallow SRF inferior periphery attached, fibrosis improved, 360 PRP, scattered IRH; ORIGINALLY: Extensive fibrosis, +TRD, boat shaped subhyaloid heme inferiorly          IMAGING AND PROCEDURES  Imaging and Procedures for 11/07/2020  OCT, Retina - OU - Both Eyes       Right Eye Quality was good. Central Foveal Thickness: 317. Progression has improved. Findings include abnormal foveal contour, subretinal fluid, no IRF, epiretinal membrane (Interval improvement in pre-retinal tractional fibrosis, retina re-attached with shallow slivers of SRF inferiorly).   Left Eye Quality was good. Central Foveal Thickness: 877. Progression has improved. Findings include abnormal foveal contour, subretinal fluid, macular pucker, epiretinal membrane, intraretinal fluid (Stable improvement in tractional fibrosis, mild interval improvement in SRF greatest inferiorly, mild interval increase in IRF nasal macula, ?progression of ERM).   Notes *Images captured and stored on drive  Diagnosis / Impression:  OD: Interval improvement in pre-retinal tractional fibrosis, retina re-attached with shallow slivers of SRF inferiorly OS: Stable improvement in tractional fibrosis, mild interval improvement in SRF greatest inferiorly, mild interval increase in IRF nasal macula, ?progression of ERM  Clinical management:  See below  Abbreviations: NFP - Normal foveal profile. CME - cystoid macular edema. PED - pigment epithelial detachment. IRF - intraretinal fluid. SRF - subretinal fluid. EZ - ellipsoid zone. ERM - epiretinal membrane. ORA - outer retinal atrophy. ORT - outer retinal tubulation. SRHM - subretinal hyper-reflective material. IRHM - intraretinal hyper-reflective material                 ASSESSMENT/PLAN:    ICD-10-CM   1.  Proliferative diabetic retinopathy of both eyes with macular edema associated with type 2 diabetes mellitus ([place])  W09.8119   2. Retinal edema  H35.81 OCT, Retina - OU -  Both Eyes  3. Retinal detachment, tractional, bilateral  H33.43   4. Essential hypertension  I10   5. Hypertensive retinopathy of both eyes  H35.033   6. Combined forms of age-related cataract of both eyes  H25.813     1-3. Severe proliferative diabetic retinopathy with edema OU  - FA (02.04.22) shows florid NV with leakage and pooling OU (OS > OD)   - OD: severe tractional fibrosis, edema, and TRD  - OS:  With TRD and subhyaloid heme             - s/p IVA OD #1 (02.08.22), #2 (03.08.222)  - s/p IVE OS #1 (02.04.22) -- sample, #2 (03.08.22) -- sample  - s/p PRP OD (02.04.22)             - s/p PRP OS (02.08.22)  - exam shows regression of NV but consolidation of fibrosis and TRD OU - OCT shows severe tractional fibrosis and edema OU, now TRD OU - discussed findings and severity of disease, guarded prognosis and extensive treatments and follow ups need to stabilize eyes and improve vision -- will likely need surgery OU  - POM s/p PPV/POC/EL/FAX/SO + IVA OS, 03.17.2022             - pt reports subjective improvement in vision OS             - fibrosis improved, but OCT shows shallow residual SRF inferiorly -- imprving             - IOP 16             - cont   PF 4x/day OS                         Brimonidine BID OS                         PSO ung QID OS   - start Ketorolac QID OS -- sample given of Prolensa to start  - POM1 s/p PPV/POC/EL/FAX/SO + IVA OD, 04.28.2022             - good oil fill place             - IOP good at 17             - cont PF 4x/day OD   Brimonidine BID OD                         PSO ung QID OD   - start Ketorolac QID OD -- sample given of Prolensa to start             - cont face down positioning; avoid laying flat on back              - post op drop and positioning instructions reviewed               - tylenol/ibuprofen for pain  - f/u 4 weeks, DFE OU, OCT   4,5. Hypertensive retinopathy OU - discussed importance of tight BP control - monitor  6. Mixed Cataract OU - The symptoms of cataract, surgical options, and treatments and risks were discussed with patient. - discussed diagnosis and progression - not yet visually significant - monitor for now  Ophthalmic Meds Ordered this visit:  Meds ordered this encounter  Medications  . ketorolac (ACULAR) 0.5 % ophthalmic solution  Sig: Place 1 drop into both eyes 4 (four) times daily.    Dispense:  10 mL    Refill:  2      Return in about 4 weeks (around 12/05/2020) for f/u PDR OU, DFE, OCT.  There are no Patient Instructions on file for this visit.  This document serves as a record of services personally performed by Gardiner Sleeper, MD, PhD. It was created on their behalf by Roselee Nova, COMT. The creation of this record is the provider's dictation and/or activities during the visit.  Electronically signed by: Roselee Nova, COMT 11/09/20 11:59 PM  This document serves as a record of services personally performed by Gardiner Sleeper, MD, PhD. It was created on their behalf by San Jetty. Owens Shark, OA an ophthalmic technician. The creation of this record is the provider's dictation and/or activities during the visit.    Electronically signed by: San Jetty. Owens Shark, [place] 06.01.2022 11:59 PM  Gardiner Sleeper, M.D., Ph.D. Diseases & Surgery of the Retina and Vitreous [facility]  I have reviewed the above documentation for accuracy and completeness, and I agree with the above. Gardiner Sleeper, M.D., Ph.D. 11/09/20 11:59 PM  Abbreviations: M myopia (nearsighted); A astigmatism; H hyperopia (farsighted); P presbyopia; Mrx spectacle prescription;  CTL contact lenses; OD right eye; OS left eye; OU both eyes  XT exotropia; ET esotropia; PEK punctate epithelial keratitis; PEE punctate epithelial erosions; DES dry eye  syndrome; MGD meibomian gland dysfunction; ATs artificial tears; PFAT's preservative free artificial tears; [place] nuclear sclerotic cataract; PSC posterior subcapsular cataract; ERM epi-retinal membrane; PVD posterior vitreous detachment; RD retinal detachment; DM diabetes mellitus; DR diabetic retinopathy; NPDR non-proliferative diabetic retinopathy; PDR proliferative diabetic retinopathy; CSME clinically significant macular edema; DME diabetic macular edema; dbh dot blot hemorrhages; CWS cotton wool spot; POAG primary open angle glaucoma; C/D cup-to-disc ratio; HVF humphrey visual field; GVF goldmann visual field; OCT optical coherence tomography; IOP intraocular pressure; BRVO Branch retinal vein occlusion; CRVO central retinal vein occlusion; CRAO central retinal artery occlusion; BRAO branch retinal artery occlusion; RT retinal tear; SB scleral buckle; PPV pars plana vitrectomy; VH Vitreous hemorrhage; PRP panretinal laser photocoagulation; IVK intravitreal kenalog; VMT vitreomacular traction; MH Macular hole;  NVD neovascularization of the disc; NVE neovascularization elsewhere; AREDS age related eye disease study; ARMD age related macular degeneration; POAG primary open angle glaucoma; EBMD epithelial/anterior basement membrane dystrophy; ACIOL anterior chamber intraocular lens; IOL intraocular lens; PCIOL posterior chamber intraocular lens; Phaco/IOL phacoemulsification with intraocular lens placement; [place] photorefractive keratectomy; LASIK laser assisted in situ keratomileusis; HTN hypertension; DM diabetes mellitus; COPD chronic obstructive pulmonary disease

## 2020-11-07 ENCOUNTER — Ambulatory Visit (INDEPENDENT_AMBULATORY_CARE_PROVIDER_SITE_OTHER): Payer: 59 | Admitting: Ophthalmology

## 2020-11-07 ENCOUNTER — Other Ambulatory Visit: Payer: Self-pay

## 2020-11-07 DIAGNOSIS — E113513 Type 2 diabetes mellitus with proliferative diabetic retinopathy with macular edema, bilateral: Secondary | ICD-10-CM

## 2020-11-07 DIAGNOSIS — H3581 Retinal edema: Secondary | ICD-10-CM

## 2020-11-07 DIAGNOSIS — I10 Essential (primary) hypertension: Secondary | ICD-10-CM | POA: Diagnosis not present

## 2020-11-07 DIAGNOSIS — H3343 Traction detachment of retina, bilateral: Secondary | ICD-10-CM | POA: Diagnosis not present

## 2020-11-07 DIAGNOSIS — H25813 Combined forms of age-related cataract, bilateral: Secondary | ICD-10-CM

## 2020-11-07 DIAGNOSIS — H35033 Hypertensive retinopathy, bilateral: Secondary | ICD-10-CM

## 2020-11-07 MED ORDER — KETOROLAC TROMETHAMINE 0.5 % OP SOLN: 1.0000 [drp] | Freq: Four times a day (QID) | OPHTHALMIC | 2 refills | Status: DC

## 2020-11-09 ENCOUNTER — Encounter (INDEPENDENT_AMBULATORY_CARE_PROVIDER_SITE_OTHER): Payer: Self-pay | Admitting: Ophthalmology

## 2020-11-13 ENCOUNTER — Other Ambulatory Visit (HOSPITAL_COMMUNITY): Payer: Self-pay

## 2020-11-19 ENCOUNTER — Other Ambulatory Visit (HOSPITAL_COMMUNITY): Payer: Self-pay

## 2020-11-19 ENCOUNTER — Telehealth (HOSPITAL_COMMUNITY): Payer: Self-pay | Admitting: Pharmacy Technician

## 2020-11-19 NOTE — Telephone Encounter (Addendum)
Advanced Heart Failure Patient Advocate Encounter  I received a renewal notice for this patient's Entresto assistance. The patient has been using a co-pay card for the medication. Will continue to use $10 co-pay card. Will not seek assistance at this time.   Archer Asa, CPhT

## 2020-12-05 ENCOUNTER — Encounter (INDEPENDENT_AMBULATORY_CARE_PROVIDER_SITE_OTHER): Payer: 59 | Admitting: Ophthalmology

## 2020-12-17 ENCOUNTER — Encounter (INDEPENDENT_AMBULATORY_CARE_PROVIDER_SITE_OTHER): Payer: 59 | Admitting: Ophthalmology

## 2020-12-24 ENCOUNTER — Emergency Department (HOSPITAL_BASED_OUTPATIENT_CLINIC_OR_DEPARTMENT_OTHER)
Admission: EM | Admit: 2020-12-24 | Discharge: 2020-12-24 | Disposition: A | Discharge status: Home / Self Care | Payer: 59 | Attending: Emergency Medicine | Admitting: Emergency Medicine

## 2020-12-24 ENCOUNTER — Other Ambulatory Visit: Payer: Self-pay

## 2020-12-24 ENCOUNTER — Encounter (HOSPITAL_BASED_OUTPATIENT_CLINIC_OR_DEPARTMENT_OTHER): Payer: Self-pay | Admitting: Emergency Medicine

## 2020-12-24 DIAGNOSIS — I2510 Atherosclerotic heart disease of native coronary artery without angina pectoris: Secondary | ICD-10-CM | POA: Diagnosis not present

## 2020-12-24 DIAGNOSIS — R040 Epistaxis: Secondary | ICD-10-CM | POA: Insufficient documentation

## 2020-12-24 DIAGNOSIS — F17210 Nicotine dependence, cigarettes, uncomplicated: Secondary | ICD-10-CM | POA: Diagnosis not present

## 2020-12-24 DIAGNOSIS — R0981 Nasal congestion: Secondary | ICD-10-CM | POA: Diagnosis not present

## 2020-12-24 DIAGNOSIS — E119 Type 2 diabetes mellitus without complications: Secondary | ICD-10-CM | POA: Diagnosis not present

## 2020-12-24 DIAGNOSIS — I110 Hypertensive heart disease with heart failure: Secondary | ICD-10-CM | POA: Diagnosis not present

## 2020-12-24 DIAGNOSIS — Z79899 Other long term (current) drug therapy: Secondary | ICD-10-CM | POA: Diagnosis not present

## 2020-12-24 DIAGNOSIS — I509 Heart failure, unspecified: Secondary | ICD-10-CM | POA: Insufficient documentation

## 2020-12-24 DIAGNOSIS — Z7984 Long term (current) use of oral hypoglycemic drugs: Secondary | ICD-10-CM | POA: Diagnosis not present

## 2020-12-24 MED ORDER — OXYMETAZOLINE HCL 0.05 % NA SOLN
1.0000 | Freq: Once | NASAL | Status: AC
  Administered 2020-12-24: 1 via NASAL
  Filled 2020-12-24: qty 30

## 2020-12-24 NOTE — ED Triage Notes (Signed)
Pt arrives to ED with c/o of epistaxis. Pt reports one episode yesterday and two episodes today in right nostril. Pt reports the episodes today took around 10 minutes to clot. Pt reports she held pressure to nare to stop bleeding.

## 2020-12-24 NOTE — Discharge Instructions (Addendum)
There is a small blood vessel in the right nose that is the source of the bleeding.  It is most likely from your recent sinus infection.  Use the nasal spray we gave you 2 times a day for the next 3 days and then stop it.  Put Vaseline on the inside of your nose 2 times a day to keep the area moist so that hopefully the bleeding will not happen again.  Avoid nose blowing.  If it does start bleeding pinch her nose closed for 4 to 5 minutes most the time that will make it stop.

## 2020-12-24 NOTE — Progress Notes (Signed)
[facility] Note  12/26/2020     CHIEF COMPLAINT Patient presents for Retina Follow Up   HISTORY OF PRESENT ILLNESS: Shelly [name] is a 50 y.o. female who presents to the clinic today for:    HPI     Retina Follow Up   Patient presents with  Diabetic Retinopathy.  In both eyes.  This started 7 weeks ago.  I, the attending physician,  performed the HPI with the patient and updated documentation appropriately.        Comments   Patient here for 7 weeks retina follow up for PDR OU. Patient states vision center OS blurred. OD still not there. No eye pain.       Last edited by Bernarda Caffey, MD on 12/26/2020  9:18 PM.    Pt states vision is better, but she uses mainly her peripheral vision to see, she is using PF and Ketorolac QID OD   Referring physician: Imagene Riches, NP [place],  [place] [address]  HISTORICAL INFORMATION:   Selected notes from the MEDICAL RECORD NUMBER Referred by Dr. Maryjane Hurter for concern of decreased VA OU   CURRENT MEDICATIONS: Current Outpatient Medications (Ophthalmic Drugs)  Medication Sig   ketorolac (ACULAR) 0.5 % ophthalmic solution Place 1 drop into both eyes 4 (four) times daily.   prednisoLONE acetate (PRED FORTE) 1 % ophthalmic suspension Place 1 drop into the left eye 4 (four) times daily.   No current facility-administered medications for this visit. (Ophthalmic Drugs)   Current Outpatient Medications (Other)  Medication Sig   atorvastatin (LIPITOR) 80 MG tablet Take 1 tablet (80 mg total) by mouth daily.   carvedilol (COREG) 3.125 MG tablet Take 1 tablet (3.125 mg total) by mouth 2 (two) times daily.   digoxin (LANOXIN) 0.125 MG tablet Take 1 tablet (125 mcg total) by mouth daily.   empagliflozin (JARDIANCE) 25 MG TABS tablet Take 1 tablet (25 mg total) by mouth daily.   furosemide (LASIX) 20 MG tablet Take 20 mg by mouth daily.   hydrOXYzine (ATARAX/VISTARIL) 25 MG tablet Take 25 mg by mouth 2  (two) times daily as needed for anxiety (sleep).   levofloxacin (LEVAQUIN) 500 MG tablet Take 500 mg by mouth daily.   metFORMIN (GLUCOPHAGE) 1000 MG tablet Take 1,000 mg by mouth in the morning and at bedtime.   potassium chloride (KLOR-CON) 10 MEQ tablet Take 4 tablets (40 mEq total) by mouth daily. (Patient taking differently: Take 20 mEq by mouth daily.)   sacubitril-valsartan (ENTRESTO) 97-103 MG Take 1 tablet by mouth 2 (two) times daily.   spironolactone (ALDACTONE) 25 MG tablet Take 1 tablet (25 mg total) by mouth daily.   TRULICITY 3 TJ/0.3ES SOPN Inject 3 mg into the skin every Sunday.   vitamin B-12 (CYANOCOBALAMIN) 1000 MCG tablet Take 1,000 mcg by mouth daily.   No current facility-administered medications for this visit. (Other)      REVIEW OF SYSTEMS: ROS   Positive for: Endocrine, Cardiovascular, Eyes Negative for: Constitutional, Gastrointestinal, Neurological, Skin, Genitourinary, Musculoskeletal, HENT, Respiratory, Psychiatric, Allergic/Imm, Heme/Lymph Last edited by Theodore Demark, COA on 12/26/2020  9:34 AM.        ALLERGIES No Known Allergies  PAST MEDICAL HISTORY Past Medical History:  Diagnosis Date   Anemia    Anxiety    Arthritis    Cataract    Mixed OU   CHF (congestive heart failure) (HCC)    Coronary artery disease  Diabetes mellitus without complication ([place])    Diabetic retinopathy ([place])    PDR OU   Headache    Cluster headaches in the past   History of kidney stones    "2 sitting"   Hypertension    Hypertensive retinopathy    OU   Myocardial infarction ([place])    Pneumonia    Tobacco abuse    Past Surgical History:  Procedure Laterality Date   EYE SURGERY Left 08/23/2020   PPV - Dr. Bernarda Caffey   INJECTION OF SILICONE OIL Left 1/61/0960   Procedure: INJECTION OF SILICONE OIL;  Surgeon: Bernarda Caffey, MD;  Location: [place];  Service: Ophthalmology;  Laterality: Left;   INJECTION OF SILICONE OIL Right 4/54/0981   Procedure:  INJECTION OF SILICONE OIL;  Surgeon: Bernarda Caffey, MD;  Location: [place];  Service: Ophthalmology;  Laterality: Right;   MEMBRANE PEEL Left 08/23/2020   Procedure: MEMBRANE PEEL;  Surgeon: Bernarda Caffey, MD;  Location: [place];  Service: Ophthalmology;  Laterality: Left;   MEMBRANE PEEL Right 10/04/2020   Procedure: MEMBRANE PEEL;  Surgeon: Bernarda Caffey, MD;  Location: [place];  Service: Ophthalmology;  Laterality: Right;   PARS PLANA VITRECTOMY Left 08/23/2020   Procedure: PARS PLANA VITRECTOMY WITH 25 GAUGE WITH ENDOLASER;  Surgeon: Bernarda Caffey, MD;  Location: [place];  Service: Ophthalmology;  Laterality: Left;   PARS PLANA VITRECTOMY Right 10/04/2020   Procedure: PARS PLANA VITRECTOMY WITH 25 GAUGE, INJECTION OF AVASTIN;  Surgeon: Bernarda Caffey, MD;  Location: [place];  Service: Ophthalmology;  Laterality: Right;   PHOTOCOAGULATION WITH LASER Right 10/04/2020   Procedure: PHOTOCOAGULATION WITH LASER;  Surgeon: Bernarda Caffey, MD;  Location: [place];  Service: Ophthalmology;  Laterality: Right;   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/23/2020   Procedure: REPAIR OF COMPLEX TRACTION RETINAL DETACHMENT;  Surgeon: Bernarda Caffey, MD;  Location: [place];  Service: Ophthalmology;  Laterality: Left;   RIGHT/LEFT HEART CATH AND CORONARY ANGIOGRAPHY N/A 10/14/2019   Procedure: RIGHT/LEFT HEART CATH AND CORONARY ANGIOGRAPHY;  Surgeon: Jolaine Artist, MD;  Location: [facility] [facility];  Service: Cardiovascular;  Laterality: N/A;    FAMILY HISTORY Family History  Problem Relation Age of Onset   Sudden Cardiac Death Mother    CAD Father    CAD Paternal Grandmother    CAD Paternal Grandfather     SOCIAL HISTORY Social History   Tobacco Use   Smoking status: Every Day    Packs/day: 1.50    Types: Cigarettes   Smokeless tobacco: Never  Vaping Use   Vaping Use: Never used  Substance Use Topics   Alcohol use: Not Currently   Drug use: Never         OPHTHALMIC EXAM:  Base Eye Exam      Visual Acuity (Snellen - Linear)       Right Left   Dist [place] 20/800 20/800   Dist ph [place] 20/350 -1 20/350 -1         Tonometry (Tonopen, 9:29 AM)       Right Left   Pressure 20 17         Pupils       Dark Light Shape React APD   Right 5 5 Round NR None   Left 5 4 Round Minimal None         Visual Fields (Counting fingers)       Left Right   Restrictions Partial outer superior temporal, inferior temporal, superior nasal, inferior nasal  deficiencies Partial outer superior temporal, inferior temporal, superior nasal, inferior nasal deficiencies         Extraocular Movement       Right Left    Full Full         Neuro/Psych     Oriented x3: Yes   Mood/Affect: Normal         Dilation     Both eyes: 1.0% Mydriacyl, 2.5% Phenylephrine @ 9:29 AM           Slit Lamp and Fundus Exam     Slit Lamp Exam       Right Left   Lids/Lashes Dermatochalasis - upper lid Dermatochalasis - upper lid   Conjunctiva/Sclera White and quiet, sutures intact White and quiet, sutures dissolved   Cornea 2-3+ Punctate epithelial erosions, no epi defect 1+ inferior Punctate epithelial erosions   Anterior Chamber deep, narrow angles deep, clear, narrow angles   Iris Round and dilated Round and dilated, No NVI   Lens 1-2+ Nuclear sclerosis, 2+ Cortical cataract, 1+ Posterior subcapsular cataract 2+ Nuclear sclerosis, 2+ Cortical cataract, 1+Posterior subcapsular cataract   Vitreous post vitrectomy, 90-95% oil fill post vitrectomy, 95-63% silicone oil fill         Fundus Exam       Right Left   Disc mild Pallor, massive fibrosis gone, Sharp rim Pink and Sharp, Compact, +fibrosis greatest temporal disc   C/D Ratio 0.2 0.2   Macula grossly flat under oil, fibrosis improved, residual pre-retinal heme ST mac -- improving Flat under oil, scattered DBH, fibrosis vastly improved, shallow residual SRF inferiorly along IT arcades along with ERM and fibrosis   Vessels severe  attenuation, tortuous attenuated, Tortuous   Periphery Attached under oil, 360 PRP, shallow SRF inferior periphery attached, fibrosis improved, 360 PRP, scattered IRH; ORIGINALLY: Extensive fibrosis, +TRD, boat shaped subhyaloid heme inferiorly            IMAGING AND PROCEDURES  Imaging and Procedures for 12/26/2020  OCT, Retina - OU - Both Eyes       Right Eye Quality was good. Central Foveal Thickness: 515. Progression has been stable. Findings include abnormal foveal contour, subretinal fluid, epiretinal membrane, intraretinal fluid (stable improvement in pre-retinal tractional fibrosis, retina re-attached with shallow slivers of SRF inferiorly - slightly improved, mild interval increase in nasal thickening).   Left Eye Quality was good. Central Foveal Thickness: 949. Progression has worsened. Findings include abnormal foveal contour, subretinal fluid, macular pucker, epiretinal membrane, intraretinal fluid (Stable improvement in tractional fibrosis, persistent SRF greatest inferiorly, mild interval increase in IRF and edema; +ERM).   Notes *Images captured and stored on drive  Diagnosis / Impression:  PDR w/ history of TRD OU s/p PPV + silicon oil OU OD: Interval improvement in pre-retinal tractional fibrosis, retina re-attached with shallow slivers of SRF inferiorly OS: Stable improvement in tractional fibrosis, persistent SRF greatest inferiorly, mild interval increase in IRF and edema; +ERM  Clinical management:  See below  Abbreviations: NFP - Normal foveal profile. CME - cystoid macular edema. PED - pigment epithelial detachment. IRF - intraretinal fluid. SRF - subretinal fluid. EZ - ellipsoid zone. ERM - epiretinal membrane. ORA - outer retinal atrophy. ORT - outer retinal tubulation. SRHM - subretinal hyper-reflective material. IRHM - intraretinal hyper-reflective material      Intravitreal Injection, Pharmacologic Agent - OS - Left Eye       Time Out 12/26/2020.  10:44 AM. Confirmed correct patient, procedure, site, and patient consented.   Anesthesia Topical  anesthesia was used. Anesthetic medications included Lidocaine 2%, Proparacaine 0.5%.   Procedure Preparation included 5% betadine to ocular surface, eyelid speculum. A supplied (32g) needle was used.   Injection: 2 mg aflibercept 2 MG/0.05ML   Route: Intravitreal, Site: Left Eye   NDC: 06237-628-31, Lot: [id number], Expiration date: 06/07/2021, Waste: 0.05 mL   Post-op Post injection exam found visual acuity of at least counting fingers. The patient tolerated the procedure well. There were no complications. The patient received written and verbal post procedure care education. Post injection medications were not given.   Notes **SAMPLE MEDICATION ADMINISTERED**              ASSESSMENT/PLAN:    ICD-10-CM   1. Proliferative diabetic retinopathy of both eyes with macular edema associated with type 2 diabetes mellitus (HCC)  T06.2694 Intravitreal Injection, Pharmacologic Agent - OS - Left Eye    aflibercept (EYLEA) SOLN 2 mg    2. Retinal edema  H35.81 OCT, Retina - OU - Both Eyes    3. Retinal detachment, tractional, bilateral  H33.43     4. Essential hypertension  I10     5. Hypertensive retinopathy of both eyes  H35.033     6. Combined forms of age-related cataract of both eyes  H25.813      1-3. Severe proliferative diabetic retinopathy with edema OU  - FA (02.04.22) shows florid NV with leakage and pooling OU (OS > OD)   - OD: severe tractional fibrosis, edema, and TRD  - OS:  With TRD and subhyaloid heme             - s/p IVA OD #1 (02.08.22), #2 (03.08.222)  - s/p IVE OS #1 (02.04.22) -- sample, #2 (03.08.22) -- sample  - s/p PRP OD (02.04.22)             - s/p PRP OS (02.08.22)  - exam shows regression of NV but consolidation of fibrosis and TRD OU - OCT shows OD: stable improvement in pre-retinal tractional fibrosis, retina re-attached with shallow slivers of  SRF inferiorly; mild interval increase in nasal thickening; OS: Stable improvement in tractional fibrosis, persistent SRF greatest inferiorly, mild interval increase in IRF and edema; +ERM - s/p PPV/POC/EL/FAX/SO + IVA OS, 03.17.2022             - pt reports subjective improvement in vision OS             - fibrosis improved, but OCT shows shallow residual SRF inferiorly -- improving             - IOP 20             - cont   PF 4x/day OS                         Brimonidine BID OS                         PSO ung QID OS   - continue Ketorolac QID OS   - s/p PPV/POC/EL/FAX/SO + IVA OD, 04.28.2022             - good oil fill place             - IOP good at 17             - cont PF 4x/day OD   Brimonidine BID OD  PSO ung QID OD   - continue Ketorolac QID OD   - recommend IVE OS #3 today, 07.20.22 -- sample -- for increased macular edema  - pt wishes to proceed  - RBA of procedure discussed, questions answered - informed consent obtained and signed - see procedure note             - post op drop and positioning instructions reviewed              - tylenol/ibuprofen for pain  - f/u 4 weeks, DFE OU, OCT   4,5. Hypertensive retinopathy OU - discussed importance of tight BP control - monitor  6. Mixed Cataract OU - The symptoms of cataract, surgical options, and treatments and risks were discussed with patient. - discussed diagnosis and progression - not yet visually significant - monitor for now  Ophthalmic Meds Ordered this visit:  Meds ordered this encounter  Medications   aflibercept (EYLEA) SOLN 2 mg       Return in about 4 weeks (around 01/23/2021) for f/u NPDR OU, DFE, OCT.  There are no Patient Instructions on file for this visit.  This document serves as a record of services personally performed by Gardiner Sleeper, MD, PhD. It was created on their behalf by Roselee Nova, COMT. The creation of this record is the provider's dictation and/or activities  during the visit.  Electronically signed by: Roselee Nova, COMT 12/26/20 9:27 PM  This document serves as a record of services personally performed by Gardiner Sleeper, MD, PhD. It was created on their behalf by San Jetty. Owens Shark, OA an ophthalmic technician. The creation of this record is the provider's dictation and/or activities during the visit.    Electronically signed by: San Jetty. Owens Shark, [place] 07.20.2022 9:27 PM   Gardiner Sleeper, M.D., Ph.D. Diseases & Surgery of the Retina and Vitreous [facility]  I have reviewed the above documentation for accuracy and completeness, and I agree with the above. Gardiner Sleeper, M.D., Ph.D. 12/26/20 9:27 PM   Abbreviations: M myopia (nearsighted); A astigmatism; H hyperopia (farsighted); P presbyopia; Mrx spectacle prescription;  CTL contact lenses; OD right eye; OS left eye; OU both eyes  XT exotropia; ET esotropia; PEK punctate epithelial keratitis; PEE punctate epithelial erosions; DES dry eye syndrome; MGD meibomian gland dysfunction; ATs artificial tears; PFAT's preservative free artificial tears; [place] nuclear sclerotic cataract; PSC posterior subcapsular cataract; ERM epi-retinal membrane; PVD posterior vitreous detachment; RD retinal detachment; DM diabetes mellitus; DR diabetic retinopathy; NPDR non-proliferative diabetic retinopathy; PDR proliferative diabetic retinopathy; CSME clinically significant macular edema; DME diabetic macular edema; dbh dot blot hemorrhages; CWS cotton wool spot; POAG primary open angle glaucoma; C/D cup-to-disc ratio; HVF humphrey visual field; GVF goldmann visual field; OCT optical coherence tomography; IOP intraocular pressure; BRVO Branch retinal vein occlusion; CRVO central retinal vein occlusion; CRAO central retinal artery occlusion; BRAO branch retinal artery occlusion; RT retinal tear; SB scleral buckle; PPV pars plana vitrectomy; VH Vitreous hemorrhage; PRP panretinal laser photocoagulation; IVK  intravitreal kenalog; VMT vitreomacular traction; MH Macular hole;  NVD neovascularization of the disc; NVE neovascularization elsewhere; AREDS age related eye disease study; ARMD age related macular degeneration; POAG primary open angle glaucoma; EBMD epithelial/anterior basement membrane dystrophy; ACIOL anterior chamber intraocular lens; IOL intraocular lens; PCIOL posterior chamber intraocular lens; Phaco/IOL phacoemulsification with intraocular lens placement; [place] photorefractive keratectomy; LASIK laser assisted in situ keratomileusis; HTN hypertension; DM diabetes mellitus; COPD chronic obstructive pulmonary disease

## 2020-12-24 NOTE — ED Provider Notes (Signed)
[facility] EMERGENCY DEPT Provider Note   CSN: [id number] Arrival date & time: 12/24/20  1545     History Chief Complaint  Patient presents with   Epistaxis    ENDYA Shelly [name] is a 50 y.o. female.  Patient is a 50 year old female with a history of CAD, CHF, diabetes, hypertension, anemia who is not on any anticoagulation presenting today after a nosebleed.  She has had 3 episodes of nosebleeds since last night.  They are always from the right nare.  The 1 last night was the heaviest but 2 smaller bleeds today and she came for evaluation.  She was diagnosed with a sinus infection and started antibiotics last week.  The history is provided by the patient.  Epistaxis Location:  R nare Severity:  Moderate Duration:  30 minutes Timing:  Intermittent Progression:  Resolved Chronicity:  New Context: aspirin use and recent infection   Relieved by:  Applying pressure Worsened by:  Nothing Ineffective treatments:  None tried Associated symptoms: blood in oropharynx, congestion and sinus pain   Associated symptoms: no cough, no dizziness, no facial pain, no fever and no sneezing   Risk factors comment:  Does not get frequent nosebleeds.  Recently on antibiotics for sinus congestion and sinus infection     Past Medical History:  Diagnosis Date   Anemia    Anxiety    Arthritis    Cataract    Mixed OU   CHF (congestive heart failure) (HCC)    Coronary artery disease    Diabetes mellitus without complication (HCC)    Diabetic retinopathy (HCC)    PDR OU   Headache    Cluster headaches in the past   History of kidney stones    "2 sitting"   Hypertension    Hypertensive retinopathy    OU   Myocardial infarction (HCC)    Pneumonia    Tobacco abuse     Patient Active Problem List   Diagnosis Date Noted   Obesity 10/14/2019   Pericardial effusion 10/14/2019   Ascites 10/14/2019   CHF (congestive heart failure) (HCC) 10/14/2019   Acute heart failure (HCC)  10/13/2019   Diabetes mellitus without complication (HCC)    Chest pain    Hyperbilirubinemia    Current smoker     Past Surgical History:  Procedure Laterality Date   EYE SURGERY Left 08/23/2020   PPV - Dr. [name]   INJECTION OF SILICONE OIL Left 08/23/2020   Procedure: INJECTION OF SILICONE OIL;  Surgeon: [name], MD;  Location: [facility] OR;  Service: Ophthalmology;  Laterality: Left;   INJECTION OF SILICONE OIL Right 10/04/2020   Procedure: INJECTION OF SILICONE OIL;  Surgeon: [name], MD;  Location: [facility] OR;  Service: Ophthalmology;  Laterality: Right;   MEMBRANE PEEL Left 08/23/2020   Procedure: MEMBRANE PEEL;  Surgeon: [name], MD;  Location: [facility] OR;  Service: Ophthalmology;  Laterality: Left;   MEMBRANE PEEL Right 10/04/2020   Procedure: MEMBRANE PEEL;  Surgeon: [name], MD;  Location: [facility] OR;  Service: Ophthalmology;  Laterality: Right;   PARS PLANA VITRECTOMY Left 08/23/2020   Procedure: PARS PLANA VITRECTOMY WITH 25 GAUGE WITH ENDOLASER;  Surgeon: [name], MD;  Location: [facility] OR;  Service: Ophthalmology;  Laterality: Left;   PARS PLANA VITRECTOMY Right 10/04/2020   Procedure: PARS PLANA VITRECTOMY WITH 25 GAUGE, INJECTION OF AVASTIN;  Surgeon: [name], MD;  Location: [facility] OR;  Service: Ophthalmology;  Laterality: Right;   PHOTOCOAGULATION WITH LASER Right 10/04/2020  Procedure: PHOTOCOAGULATION WITH LASER;  Surgeon: [name], MD;  Location: [facility] OR;  Service: Ophthalmology;  Laterality: Right;   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/23/2020   Procedure: REPAIR OF COMPLEX TRACTION RETINAL DETACHMENT;  Surgeon: [name], MD;  Location: [facility] OR;  Service: Ophthalmology;  Laterality: Left;   RIGHT/LEFT HEART CATH AND CORONARY ANGIOGRAPHY N/A 10/14/2019   Procedure: RIGHT/LEFT HEART CATH AND CORONARY ANGIOGRAPHY;  Surgeon: Dolores Patty, MD;  Location: [facility] INVASIVE CV LAB;  Service: Cardiovascular;  Laterality: N/A;     OB History    No obstetric history on file.     Family History  Problem Relation Age of Onset   Sudden Cardiac Death Mother    CAD Father    CAD Paternal Grandmother    CAD Paternal Grandfather     Social History   Tobacco Use   Smoking status: Every Day    Packs/day: 1.50    Types: Cigarettes   Smokeless tobacco: Never  Vaping Use   Vaping Use: Never used  Substance Use Topics   Alcohol use: Not Currently   Drug use: Never    Home Medications Prior to Admission medications   Medication Sig Start Date End Date Taking? Authorizing Provider  atorvastatin (LIPITOR) 80 MG tablet Take 1 tablet (80 mg total) by mouth daily. 03/15/20   Bensimhon, Bevelyn Buckles, MD  carvedilol (COREG) 3.125 MG tablet Take 1 tablet (3.125 mg total) by mouth 2 (two) times daily. 03/15/20 03/15/21  Bensimhon, Bevelyn Buckles, MD  digoxin (LANOXIN) 0.125 MG tablet Take 1 tablet (125 mcg total) by mouth daily. 03/15/20   Bensimhon, Bevelyn Buckles, MD  empagliflozin (JARDIANCE) 25 MG TABS tablet Take 1 tablet (25 mg total) by mouth daily. 08/27/20   Clegg, Amy D, NP  furosemide (LASIX) 20 MG tablet Take 20 mg by mouth daily.    [provider]  hydrOXYzine (ATARAX/VISTARIL) 25 MG tablet Take 25 mg by mouth 2 (two) times daily as needed for anxiety (sleep). 05/04/20   [provider]  ketorolac (ACULAR) 0.5 % ophthalmic solution Place 1 drop into both eyes 4 (four) times daily. 11/07/20   [name], MD  levofloxacin (LEVAQUIN) 500 MG tablet Take 500 mg by mouth daily.    [provider]  metFORMIN (GLUCOPHAGE) 1000 MG tablet Take 1,000 mg by mouth in the morning and at bedtime. 09/13/19   [provider]  potassium chloride (KLOR-CON) 10 MEQ tablet Take 4 tablets (40 mEq total) by mouth daily. Patient taking differently: Take 20 mEq by mouth daily. 07/02/20   Bensimhon, Bevelyn Buckles, MD  prednisoLONE acetate (PRED FORTE) 1 % ophthalmic suspension Place 1 drop into the left eye 4 (four) times daily. 08/30/20    [name], MD  sacubitril-valsartan (ENTRESTO) 97-103 MG Take 1 tablet by mouth 2 (two) times daily. 08/27/20   Clegg, Amy D, NP  spironolactone (ALDACTONE) 25 MG tablet Take 1 tablet (25 mg total) by mouth daily. 03/15/20   Bensimhon, Bevelyn Buckles, MD  TRULICITY 3 MG/0.5ML SOPN Inject 3 mg into the skin every Sunday. 08/29/20   [provider]  vitamin B-12 (CYANOCOBALAMIN) 1000 MCG tablet Take 1,000 mcg by mouth daily.    [provider]    Allergies    Patient has no known allergies.  Review of Systems   Review of Systems  Constitutional:  Negative for fever.  HENT:  Positive for congestion, nosebleeds and sinus pain. Negative for sneezing.   Respiratory:  Negative for  cough.   Neurological:  Negative for dizziness.  All other systems reviewed and are negative.  Physical Exam Updated Vital Signs BP 117/67 (BP Location: Right Arm)   Pulse 71   Temp 97.7 F (36.5 C) (Temporal)   Resp 16   Ht 5\' 8"  (1.727 m)   Wt 79.4 kg   LMP  (LMP Unknown)   SpO2 99%   BMI 26.61 kg/m   Physical Exam Vitals and nursing note reviewed.  Constitutional:      General: She is not in acute distress.    Appearance: Normal appearance. She is normal weight.  HENT:     Head: Normocephalic.     Nose:     Comments: Small vessel noted in the right anterior nare that is clotted and not actively bleeding.  No blood in the posterior pharynx Eyes:     Pupils: Pupils are equal, round, and reactive to light.  Cardiovascular:     Rate and Rhythm: Normal rate.  Pulmonary:     Effort: Pulmonary effort is normal.  Skin:    General: Skin is warm and dry.  Neurological:     Mental Status: She is alert and oriented to person, place, and time. Mental status is at baseline.  Psychiatric:        Mood and Affect: Mood normal.        Behavior: Behavior normal.    ED Results / Procedures / Treatments   Labs (all labs ordered are listed, but only abnormal results are displayed) Labs Reviewed  - No data to display  EKG None  Radiology No results found.  Procedures Procedures   Medications Ordered in ED Medications  oxymetazoline (AFRIN) 0.05 % nasal spray 1 spray (1 spray Each Nare Given 12/24/20 1837)    ED Course  I have reviewed the triage vital signs and the nursing notes.  Pertinent labs & imaging results that were available during my care of the patient were reviewed by me and considered in my medical decision making (see chart for details).    MDM Rules/Calculators/A&P                          Patient presenting with recurrent epistaxis that started last night.  Small area noted in the right nare that is not currently bleeding at this time.  Patient takes aspirin but no other anticoagulation.  Blood pressure is normal.  Patient otherwise well-appearing.  Low suspicion for anemia based on the amount of time she reported bleeding.  Patient given Afrin to use twice daily as well as placing Vaseline on the inside of the nose.  She was given ENT follow-up if she continues to have bleeds and given instructions on how to stop the bleed if it starts again at home.  Final Clinical Impression(s) / ED Diagnoses Final diagnoses:  Epistaxis    Rx / DC Orders ED Discharge Orders     None        12/26/20, MD 12/24/20 1912

## 2020-12-24 NOTE — ED Notes (Signed)
ED Provider at bedside. 

## 2020-12-26 ENCOUNTER — Ambulatory Visit (INDEPENDENT_AMBULATORY_CARE_PROVIDER_SITE_OTHER): Payer: 59 | Admitting: Ophthalmology

## 2020-12-26 ENCOUNTER — Other Ambulatory Visit: Payer: Self-pay

## 2020-12-26 ENCOUNTER — Encounter (INDEPENDENT_AMBULATORY_CARE_PROVIDER_SITE_OTHER): Payer: Self-pay | Admitting: Ophthalmology

## 2020-12-26 DIAGNOSIS — I10 Essential (primary) hypertension: Secondary | ICD-10-CM | POA: Diagnosis not present

## 2020-12-26 DIAGNOSIS — H3343 Traction detachment of retina, bilateral: Secondary | ICD-10-CM | POA: Diagnosis not present

## 2020-12-26 DIAGNOSIS — H35033 Hypertensive retinopathy, bilateral: Secondary | ICD-10-CM

## 2020-12-26 DIAGNOSIS — H25813 Combined forms of age-related cataract, bilateral: Secondary | ICD-10-CM

## 2020-12-26 DIAGNOSIS — E113513 Type 2 diabetes mellitus with proliferative diabetic retinopathy with macular edema, bilateral: Secondary | ICD-10-CM | POA: Diagnosis not present

## 2020-12-26 DIAGNOSIS — H3581 Retinal edema: Secondary | ICD-10-CM

## 2020-12-26 MED ORDER — AFLIBERCEPT 2MG/0.05ML IZ SOLN FOR KALEIDOSCOPE
2.0000 mg | INTRAVITREAL | Status: AC | PRN
  Administered 2020-12-26: 2 mg via INTRAVITREAL

## 2021-01-07 ENCOUNTER — Ambulatory Visit
Admission: RE | Admit: 2021-01-07 | Discharge: 2021-01-07 | Disposition: A | Discharge status: Home / Self Care | Payer: 59 | Source: Ambulatory Visit | Attending: Family | Admitting: Family

## 2021-01-07 ENCOUNTER — Other Ambulatory Visit: Payer: Self-pay | Admitting: Family

## 2021-01-07 DIAGNOSIS — R55 Syncope and collapse: Secondary | ICD-10-CM

## 2021-01-07 DIAGNOSIS — R42 Dizziness and giddiness: Secondary | ICD-10-CM

## 2021-01-10 ENCOUNTER — Other Ambulatory Visit (HOSPITAL_COMMUNITY): Payer: Self-pay | Admitting: Internal Medicine

## 2021-01-15 ENCOUNTER — Encounter (HOSPITAL_COMMUNITY): Payer: Self-pay | Admitting: Internal Medicine

## 2021-01-15 ENCOUNTER — Other Ambulatory Visit: Payer: Self-pay

## 2021-01-15 ENCOUNTER — Ambulatory Visit (HOSPITAL_COMMUNITY)
Admission: RE | Admit: 2021-01-15 | Discharge: 2021-01-15 | Disposition: A | Discharge status: Home / Self Care | Payer: 59 | Source: Ambulatory Visit | Attending: Adult Health | Admitting: Adult Health

## 2021-01-15 ENCOUNTER — Ambulatory Visit (HOSPITAL_BASED_OUTPATIENT_CLINIC_OR_DEPARTMENT_OTHER)
Admission: RE | Admit: 2021-01-15 | Discharge: 2021-01-15 | Disposition: A | Discharge status: Home / Self Care | Payer: 59 | Source: Ambulatory Visit | Attending: Internal Medicine | Admitting: Internal Medicine

## 2021-01-15 VITALS — BP 104/78 | HR 87 | Wt 177.6 lb

## 2021-01-15 DIAGNOSIS — I5022 Chronic systolic (congestive) heart failure: Secondary | ICD-10-CM | POA: Insufficient documentation

## 2021-01-15 DIAGNOSIS — I252 Old myocardial infarction: Secondary | ICD-10-CM | POA: Insufficient documentation

## 2021-01-15 DIAGNOSIS — I110 Hypertensive heart disease with heart failure: Secondary | ICD-10-CM | POA: Insufficient documentation

## 2021-01-15 DIAGNOSIS — E785 Hyperlipidemia, unspecified: Secondary | ICD-10-CM

## 2021-01-15 DIAGNOSIS — E119 Type 2 diabetes mellitus without complications: Secondary | ICD-10-CM | POA: Diagnosis not present

## 2021-01-15 DIAGNOSIS — F17210 Nicotine dependence, cigarettes, uncomplicated: Secondary | ICD-10-CM | POA: Insufficient documentation

## 2021-01-15 DIAGNOSIS — Z720 Tobacco use: Secondary | ICD-10-CM | POA: Diagnosis not present

## 2021-01-15 DIAGNOSIS — I2510 Atherosclerotic heart disease of native coronary artery without angina pectoris: Secondary | ICD-10-CM

## 2021-01-15 LAB — ECHOCARDIOGRAM COMPLETE
Area-P 1/2: 3.6 cm2
S' Lateral: 4.3 cm

## 2021-01-15 MED ORDER — POTASSIUM CHLORIDE CRYS ER 10 MEQ PO TBCR: 20.0000 meq | EXTENDED_RELEASE_TABLET | Freq: Every day | ORAL | 3 refills | Status: DC

## 2021-01-15 MED ORDER — FUROSEMIDE 20 MG PO TABS: 20.0000 mg | ORAL_TABLET | ORAL | 3 refills | Status: DC

## 2021-01-15 MED ORDER — ENTRESTO 24-26 MG PO TABS: 1.0000 | ORAL_TABLET | Freq: Two times a day (BID) | ORAL | 3 refills | Status: DC

## 2021-01-15 MED ORDER — CARVEDILOL 3.125 MG PO TABS: 3.1250 mg | ORAL_TABLET | Freq: Two times a day (BID) | ORAL | 3 refills | Status: DC

## 2021-01-15 MED ORDER — ROSUVASTATIN CALCIUM 20 MG PO TABS: 20.0000 mg | ORAL_TABLET | Freq: Every day | ORAL | 3 refills | Status: DC

## 2021-01-15 NOTE — Addendum Note (Signed)
Encounter addended by: Demetrius Charity, RN on: 01/15/2021 4:36 PM  Actions taken: Order list changed, Pharmacy for encounter modified, Clinical Note Signed

## 2021-01-15 NOTE — Addendum Note (Signed)
Encounter addended by: Suezanne Cheshire, RN on: 01/15/2021 4:28 PM  Actions taken: Medication long-term status modified, Visit diagnoses modified, Diagnosis association updated, Order list changed, Pharmacy for encounter modified, Clinical Note Signed

## 2021-01-15 NOTE — Progress Notes (Signed)
  Echocardiogram 2D Echocardiogram has been performed.  Shelly [name] Zhi Geier 01/15/2021, 2:55 PM

## 2021-01-15 NOTE — Patient Instructions (Addendum)
STOP Digoxin  STOP Atorvastatin  Decrease Entresto 24/26 mg ( 1 tablet) Twice daily  Decrease Lasix to Every Monday Wednesday and Friday and As needed  Increase Coreg to 3.125 mg Twice daily  Start Crestor 20 mg daily  Potassium 20 meq daily...take 2 tablets the first day  You have been referred to Lipid clinic, They will call to schedule an appointment  Your physician recommends that you schedule a follow-up appointment in: 2 months Tuesday October 11th , 2022 at 2:30 pm  milAt the [facility], you and your health needs are our priority. As part of our continuing mission to provide you with exceptional heart care, we have created designated Provider Care Teams. These Care Teams include your primary Cardiologist (physician) and Advanced Practice Providers (APPs- Physician Assistants and Nurse Practitioners) who all work together to provide you with the care you need, when you need it.   You may see any of the following providers on your designated Care Team at your next follow up: Dr Arvilla Meres Dr Marca Ancona Dr Brandon Melnick, NP Robbie Lis, [place] Mikki Santee Karle Plumber, PharmD   Please be sure to bring in all your medications bottles to every appointment.     If you have any questions or concerns before your next appointment please send [place] a message through [place] or call our office at [phone].    TO LEAVE A MESSAGE FOR THE NURSE SELECT OPTION 2, PLEASE LEAVE A MESSAGE INCLUDING: YOUR NAME DATE OF BIRTH CALL BACK NUMBER REASON FOR CALL**this is important as we prioritize the call backs  YOU WILL RECEIVE A CALL BACK THE SAME DAY AS LONG AS YOU CALL BEFORE 4:00 PM

## 2021-01-15 NOTE — Addendum Note (Signed)
Encounter addended by: Dolores Patty, MD on: 01/15/2021 4:32 PM  Actions taken: Clinical Note Signed

## 2021-01-15 NOTE — Progress Notes (Addendum)
[facility] Note    PCP: Dema Severin, NP  Cardiologist: Dr. Eldridge Dace HF: Dr. Gala Romney   HPI: [name] is a 50 year old woman with DM2, tobacco abuse, CAD and systolic HF diagnosed in 5/21 with EF 20%  Admitted 5/21 with acute HF. Echo EF 20% with severe RV dysfunction. R/LHC showed diffuse non-obstructive CAD with chronic dissection/recannalization of mid LAD. Suspect she had an out of hospital MI at some point.  There were no interventional targets.  Outpatient cMRI  8/21 1.  Mild LV dilatation with severe systolic dysfunction (LVEF 30%) 2. Subendocardial LGE c/w prior LAD and RCA infarcts. >50% transmurality of LGE suggesting nonviability in basal to mid inferior walls, apical anterior/septal walls, and apex. <50% transmurality of LGE suggesting viability in mid anterior wall. 3.  Normal RV size and systolic function (EF 66%)  Today she returns for HF follow up. At last visit we increased Entresto to 97/103 bid but she kept it at 49/51 bid due to low BP. Also cut back carvedilol 3.125 daily. Feels good but BP remains borderline. At times feels like she is going to pass out. SBP runs 86-128. Most SBPs 100-105. Still smoking 1-2ppd.  HgBA1c down to 6.5%. No CP, edema, orthopnea or PND. No dyspnea with regular activity. Taking lasix 40 daily. Not taking atorvastatin much due to diarrhea. Recent LDL135   Echo today 01/15/21:  EF 35-40% RV ok Personally reviewed   Echo 5/21: EF 20% with severe RV dysfunction.  RHC 5/21 Ao = 106/75 (87) LV = 107/27 RA = 15 RV = 54/16 PA = 57/25 (40) PCW = 27 (v = 37) Fick cardiac output/index = 4.4/2.0 PVR = 1.4 WU SVR = 1297 Ao sat = 99% PA sat = 62%, 63%   Past Medical History:  Diagnosis Date   Anemia    Anxiety    Arthritis    Cataract    Mixed OU   CHF (congestive heart failure) (HCC)    Coronary artery disease    Diabetes mellitus without complication (HCC)    Diabetic retinopathy (HCC)    PDR OU    Headache    Cluster headaches in the past   History of kidney stones    "2 sitting"   Hypertension    Hypertensive retinopathy    OU   Myocardial infarction (HCC)    Pneumonia    Tobacco abuse     Current Outpatient Medications  Medication Sig Dispense Refill   aspirin 81 MG chewable tablet Chew 81 mg by mouth daily.     atorvastatin (LIPITOR) 80 MG tablet TAKE 1 TABLET BY MOUTH EVERY DAY 90 tablet 3   carvedilol (COREG) 3.125 MG tablet Take 3.125 mg by mouth daily in the afternoon.     digoxin (LANOXIN) 0.125 MG tablet Take 1 tablet (125 mcg total) by mouth daily. 90 tablet 3   empagliflozin (JARDIANCE) 25 MG TABS tablet Take 1 tablet (25 mg total) by mouth daily. 90 tablet 3   furosemide (LASIX) 20 MG tablet Take 20 mg by mouth daily.     ketorolac (ACULAR) 0.5 % ophthalmic solution Place 1 drop into both eyes 4 (four) times daily. 10 mL 2   metFORMIN (GLUCOPHAGE) 1000 MG tablet Take 1,000 mg by mouth daily in the afternoon.     potassium chloride (KLOR-CON) 10 MEQ tablet Take 10 mEq by mouth daily.     prednisoLONE acetate (PRED FORTE) 1 % ophthalmic suspension Place 1 drop into the  left eye 4 (four) times daily. 15 mL 0   sacubitril-valsartan (ENTRESTO) 97-103 MG Take 1 tablet by mouth 2 (two) times daily. 60 tablet 11   spironolactone (ALDACTONE) 25 MG tablet TAKE 1 TABLET BY MOUTH EVERY DAY 90 tablet 3   TRULICITY 3 MG/0.5ML SOPN Inject 3 mg into the skin every Sunday.     No current facility-administered medications for this encounter.    No Known Allergies    Social History   Socioeconomic History   Marital status: Married    Spouse name: Not on file   Number of children: Not on file   Years of education: Not on file   Highest education level: Not on file  Occupational History   Not on file  Tobacco Use   Smoking status: Every Day    Packs/day: 1.50    Types: Cigarettes   Smokeless tobacco: Never  Vaping Use   Vaping Use: Never used  Substance and Sexual  Activity   Alcohol use: Not Currently   Drug use: Never   Sexual activity: Not on file  Other Topics Concern   Not on file  Social History Narrative   Not on file   Social Determinants of Health   Financial Resource Strain: Not on file  Food Insecurity: Not on file  Transportation Needs: Not on file  Physical Activity: Not on file  Stress: Not on file  Social Connections: Not on file  Intimate Partner Violence: Not on file      Family History  Problem Relation Age of Onset   Sudden Cardiac Death Mother    CAD Father    CAD Paternal Grandmother    CAD Paternal Grandfather     Vitals:   01/15/21 1512  BP: 104/78  Pulse: 87  SpO2: 97%  Weight: 80.6 kg (177 lb 9.6 oz)   Wt Readings from Last 3 Encounters:  01/15/21 80.6 kg (177 lb 9.6 oz)  12/24/20 79.4 kg (175 lb)  10/04/20 83.9 kg (185 lb)      PHYSICAL EXAM: General:  Well appearing. No resp difficulty HEENT: normal Neck: supple. no JVD. Carotids 2+ bilat; no bruits. No lymphadenopathy or thryomegaly appreciated. Cor: PMI nondisplaced. Regular rate & rhythm. No rubs, gallops or murmurs. Lungs: decreased throughout Abdomen: soft, nontender, nondistended. No hepatosplenomegaly. No bruits or masses. Good bowel sounds. Extremities: no cyanosis, clubbing, rash, edema Neuro: alert & orientedx3, cranial nerves grossly intact. moves all 4 extremities w/o difficulty. Affect pleasant    ASSESSMENT & PLAN: 1. 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 R/LHC showed diffuse non-obstructive CAD with chronic dissection/recannalization of mid LAD. Suspect she had an out of hospital MI at some point.  There were no interventional targets. - cMRI 8/21: LVEF 30% priro LAD & RCA infarcts. RV ok  - Echo today 01/15/21:  EF 35-40% RV ok Personally reviewed - Stable NYHA II Volume status running low . BP low  - Decrease Entresto to 24/26 bid - continue Spiro 25 mg daily - Increase  Coreg back to 3.125 mg bid - Stop digoxin - Continue Jardiance 25 mg daily.  - Cut lasix to M/W/F + as needed  2. CAD: - LHC 5/21 showed diffuse non-obstructive CAD with chronic dissection/recannalization of mid LAD. Suspect she had an out of hospital MI at some point.  There were no interventional targets. - cMRI as above c/w prior infarcts in LAD/RCA territories - No s/s angina - continue  ASA - not taking atorva regularly due to GI side effects. Will switch to Crestor and send to Lipid Clinic to discuss options  - complete smoking cessation strongly advised.   3. Type 2 DM: management per PCP - continue Metformin - continue Jardiance - HgBA1c down to 6.5%.  4. Tobacco Abuse: - Discussed smoking cessation   5. HLD:  - LDL goal <70 (98 on recent FLP 6/21).  - plan as above  6. Hypokalemia - recent K 3.3 - start kdur 20 daily. Take 2 tabs on the first day   Arvilla Meres, MD 01/15/21

## 2021-01-23 ENCOUNTER — Encounter (INDEPENDENT_AMBULATORY_CARE_PROVIDER_SITE_OTHER): Payer: 59 | Admitting: Ophthalmology

## 2021-01-23 DIAGNOSIS — H3581 Retinal edema: Secondary | ICD-10-CM

## 2021-01-23 DIAGNOSIS — E113513 Type 2 diabetes mellitus with proliferative diabetic retinopathy with macular edema, bilateral: Secondary | ICD-10-CM

## 2021-01-23 DIAGNOSIS — H25813 Combined forms of age-related cataract, bilateral: Secondary | ICD-10-CM

## 2021-01-23 DIAGNOSIS — H35033 Hypertensive retinopathy, bilateral: Secondary | ICD-10-CM

## 2021-01-23 DIAGNOSIS — I10 Essential (primary) hypertension: Secondary | ICD-10-CM

## 2021-01-23 DIAGNOSIS — H3343 Traction detachment of retina, bilateral: Secondary | ICD-10-CM

## 2021-01-29 ENCOUNTER — Telehealth (HOSPITAL_COMMUNITY): Payer: Self-pay | Admitting: *Deleted

## 2021-01-29 DIAGNOSIS — E785 Hyperlipidemia, unspecified: Secondary | ICD-10-CM

## 2021-01-29 NOTE — Telephone Encounter (Signed)
Pt left vm stating crestor is upsetting her stomach. Pt said this happened with her last statin and she did not realize she was being put on another statin. Pt asked if there was any other medication she could take.  Routed to Dr.Bensimhon for advice

## 2021-02-04 NOTE — Telephone Encounter (Signed)
Referral placed.

## 2021-02-15 ENCOUNTER — Encounter (INDEPENDENT_AMBULATORY_CARE_PROVIDER_SITE_OTHER): Payer: 59 | Admitting: Ophthalmology

## 2021-02-18 ENCOUNTER — Other Ambulatory Visit (HOSPITAL_COMMUNITY): Payer: Self-pay | Admitting: Internal Medicine

## 2021-02-19 ENCOUNTER — Encounter (INDEPENDENT_AMBULATORY_CARE_PROVIDER_SITE_OTHER): Payer: 59 | Admitting: Ophthalmology

## 2021-02-20 ENCOUNTER — Emergency Department (HOSPITAL_BASED_OUTPATIENT_CLINIC_OR_DEPARTMENT_OTHER)
Admission: EM | Admit: 2021-02-20 | Discharge: 2021-02-20 | Disposition: A | Discharge status: Home / Self Care | Payer: 59 | Attending: Emergency Medicine | Admitting: Emergency Medicine

## 2021-02-20 ENCOUNTER — Emergency Department (HOSPITAL_BASED_OUTPATIENT_CLINIC_OR_DEPARTMENT_OTHER): Payer: 59 | Admitting: Radiology

## 2021-02-20 ENCOUNTER — Emergency Department (HOSPITAL_BASED_OUTPATIENT_CLINIC_OR_DEPARTMENT_OTHER): Payer: 59

## 2021-02-20 ENCOUNTER — Encounter (HOSPITAL_BASED_OUTPATIENT_CLINIC_OR_DEPARTMENT_OTHER): Payer: Self-pay | Admitting: Urology

## 2021-02-20 DIAGNOSIS — I110 Hypertensive heart disease with heart failure: Secondary | ICD-10-CM | POA: Insufficient documentation

## 2021-02-20 DIAGNOSIS — I509 Heart failure, unspecified: Secondary | ICD-10-CM | POA: Diagnosis not present

## 2021-02-20 DIAGNOSIS — E119 Type 2 diabetes mellitus without complications: Secondary | ICD-10-CM | POA: Insufficient documentation

## 2021-02-20 DIAGNOSIS — M546 Pain in thoracic spine: Secondary | ICD-10-CM | POA: Diagnosis not present

## 2021-02-20 DIAGNOSIS — Z7984 Long term (current) use of oral hypoglycemic drugs: Secondary | ICD-10-CM | POA: Diagnosis not present

## 2021-02-20 DIAGNOSIS — F17210 Nicotine dependence, cigarettes, uncomplicated: Secondary | ICD-10-CM | POA: Diagnosis not present

## 2021-02-20 DIAGNOSIS — Z20822 Contact with and (suspected) exposure to covid-19: Secondary | ICD-10-CM | POA: Diagnosis not present

## 2021-02-20 DIAGNOSIS — I2510 Atherosclerotic heart disease of native coronary artery without angina pectoris: Secondary | ICD-10-CM | POA: Insufficient documentation

## 2021-02-20 DIAGNOSIS — Z7982 Long term (current) use of aspirin: Secondary | ICD-10-CM | POA: Insufficient documentation

## 2021-02-20 DIAGNOSIS — R55 Syncope and collapse: Secondary | ICD-10-CM | POA: Diagnosis not present

## 2021-02-20 LAB — CBC WITH DIFFERENTIAL/PLATELET
Abs Immature Granulocytes: 0.02 10*3/uL (ref 0.00–0.07)
Basophils Absolute: 0.1 10*3/uL (ref 0.0–0.1)
Basophils Relative: 1 %
Eosinophils Absolute: 0.5 10*3/uL (ref 0.0–0.5)
Eosinophils Relative: 4 %
HCT: 48.1 % — ABNORMAL HIGH (ref 36.0–46.0)
Hemoglobin: 16 g/dL — ABNORMAL HIGH (ref 12.0–15.0)
Immature Granulocytes: 0 %
Lymphocytes Relative: 27 %
Lymphs Abs: 2.8 10*3/uL (ref 0.7–4.0)
MCH: 28.6 pg (ref 26.0–34.0)
MCHC: 33.3 g/dL (ref 30.0–36.0)
MCV: 85.9 fL (ref 80.0–100.0)
Monocytes Absolute: 0.7 10*3/uL (ref 0.1–1.0)
Monocytes Relative: 6 %
Neutro Abs: 6.5 10*3/uL (ref 1.7–7.7)
Neutrophils Relative %: 62 %
Platelets: 297 10*3/uL (ref 150–400)
RBC: 5.6 MIL/uL — ABNORMAL HIGH (ref 3.87–5.11)
RDW: 12.8 % (ref 11.5–15.5)
WBC: 10.5 10*3/uL (ref 4.0–10.5)
nRBC: 0 % (ref 0.0–0.2)

## 2021-02-20 LAB — COMPREHENSIVE METABOLIC PANEL
ALT: 7 U/L (ref 0–44)
AST: 8 U/L — ABNORMAL LOW (ref 15–41)
Albumin: 4.4 g/dL (ref 3.5–5.0)
Alkaline Phosphatase: 65 U/L (ref 38–126)
Anion gap: 7 (ref 5–15)
BUN: 12 mg/dL (ref 6–20)
CO2: 29 mmol/L (ref 22–32)
Calcium: 9.9 mg/dL (ref 8.9–10.3)
Chloride: 100 mmol/L (ref 98–111)
Creatinine, Ser: 0.85 mg/dL (ref 0.44–1.00)
GFR, Estimated: >60 mL/min (ref >60)
Glucose, Bld: 158 mg/dL — ABNORMAL HIGH (ref 70–99)
Potassium: 3.8 mmol/L (ref 3.5–5.1)
Sodium: 136 mmol/L (ref 135–145)
Total Bilirubin: 1 mg/dL (ref 0.3–1.2)
Total Protein: 6.9 g/dL (ref 6.5–8.1)

## 2021-02-20 LAB — TROPONIN I (HIGH SENSITIVITY): Troponin I (High Sensitivity): 6 ng/L (ref <18)

## 2021-02-20 LAB — RESP PANEL BY RT-PCR (FLU A&B, COVID) ARPGX2
Influenza A by PCR: NEGATIVE
Influenza B by PCR: NEGATIVE
SARS Coronavirus 2 by RT PCR: NEGATIVE

## 2021-02-20 MED ORDER — SODIUM CHLORIDE 0.9 % IV BOLUS: 250.0000 mL | Freq: Once | INTRAVENOUS | Status: DC

## 2021-02-20 MED ORDER — FENTANYL CITRATE PF 50 MCG/ML IJ SOSY
50.0000 ug | PREFILLED_SYRINGE | Freq: Once | INTRAMUSCULAR | Status: DC
Start: 2021-02-20 — End: 2021-02-20
  Filled 2021-02-20: qty 1

## 2021-02-20 MED ORDER — CYCLOBENZAPRINE HCL 5 MG PO TABS: 5.0000 mg | ORAL_TABLET | Freq: Two times a day (BID) | ORAL | 0 refills | Status: AC

## 2021-02-20 NOTE — ED Notes (Signed)
Pt refused IV and meds - Dr. Lockie Mola informed.

## 2021-02-20 NOTE — ED Triage Notes (Signed)
Syncope this am, states back pain and HA since. H/o Syncopal episode due to orthostatic hypotension.  Denies any N/V

## 2021-02-20 NOTE — ED Provider Notes (Signed)
[facility] EMERGENCY DEPT Provider Note   CSN: [id number] Arrival date & time: 02/20/21  1724     History Chief Complaint  Patient presents with   Loss of Consciousness    Shelly [name] is a 50 y.o. female.  Patient here after syncopal event this morning.  States she has a history of orthostatic hypotension and frequent syncopal events similar to today.  Her main concern is her back pain following her fall today.  She woke up this morning got up out of bed and as soon as she started to walk she passed out.  She has felt fine throughout the day but has developed back pain over the last several hours.  This event occurred about 12 hours ago.  She does not have any chest pain or shortness of breath.  States that it feels like her prior events.  She follows with cardiology team for heart failure.  The history is provided by the patient.  Loss of Consciousness Episode history:  Multiple Most recent episode:  Today Timing:  Intermittent Progression:  Waxing and waning Chronicity:  Recurrent Context: standing up   Witnessed: yes   Relieved by:  Nothing Worsened by:  Nothing Associated symptoms: no chest pain, no fever, no palpitations, no seizures, no shortness of breath and no vomiting       Past Medical History:  Diagnosis Date   Anemia    Anxiety    Arthritis    Cataract    Mixed OU   CHF (congestive heart failure) (HCC)    Coronary artery disease    Diabetes mellitus without complication (HCC)    Diabetic retinopathy (HCC)    PDR OU   Headache    Cluster headaches in the past   History of kidney stones    "2 sitting"   Hypertension    Hypertensive retinopathy    OU   Myocardial infarction (HCC)    Pneumonia    Tobacco abuse     Patient Active Problem List   Diagnosis Date Noted   Obesity 10/14/2019   Pericardial effusion 10/14/2019   Ascites 10/14/2019   CHF (congestive heart failure) (HCC) 10/14/2019   Acute heart failure (HCC) 10/13/2019    Diabetes mellitus without complication (HCC)    Chest pain    Hyperbilirubinemia    Current smoker     Past Surgical History:  Procedure Laterality Date   EYE SURGERY Left 08/23/2020   PPV - Dr. [name]   INJECTION OF SILICONE OIL Left 08/23/2020   Procedure: INJECTION OF SILICONE OIL;  Surgeon: [name], MD;  Location: [facility] OR;  Service: Ophthalmology;  Laterality: Left;   INJECTION OF SILICONE OIL Right 10/04/2020   Procedure: INJECTION OF SILICONE OIL;  Surgeon: [name], MD;  Location: [facility] OR;  Service: Ophthalmology;  Laterality: Right;   MEMBRANE PEEL Left 08/23/2020   Procedure: MEMBRANE PEEL;  Surgeon: [name], MD;  Location: [facility] OR;  Service: Ophthalmology;  Laterality: Left;   MEMBRANE PEEL Right 10/04/2020   Procedure: MEMBRANE PEEL;  Surgeon: [name], MD;  Location: [facility] OR;  Service: Ophthalmology;  Laterality: Right;   PARS PLANA VITRECTOMY Left 08/23/2020   Procedure: PARS PLANA VITRECTOMY WITH 25 GAUGE WITH ENDOLASER;  Surgeon: [name], MD;  Location: [facility] OR;  Service: Ophthalmology;  Laterality: Left;   PARS PLANA VITRECTOMY Right 10/04/2020   Procedure: PARS PLANA VITRECTOMY WITH 25 GAUGE, INJECTION OF AVASTIN;  Surgeon: [name], MD;  Location: [facility] OR;  Service: Ophthalmology;  Laterality: Right;   PHOTOCOAGULATION WITH LASER Right 10/04/2020   Procedure: PHOTOCOAGULATION WITH LASER;  Surgeon: [name], MD;  Location: [facility] OR;  Service: Ophthalmology;  Laterality: Right;   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/23/2020   Procedure: REPAIR OF COMPLEX TRACTION RETINAL DETACHMENT;  Surgeon: [name], MD;  Location: [facility] OR;  Service: Ophthalmology;  Laterality: Left;   RIGHT/LEFT HEART CATH AND CORONARY ANGIOGRAPHY N/A 10/14/2019   Procedure: RIGHT/LEFT HEART CATH AND CORONARY ANGIOGRAPHY;  Surgeon: Dolores Patty, MD;  Location: [facility] INVASIVE CV LAB;  Service: Cardiovascular;  Laterality: N/A;     OB History   No obstetric  history on file.     Family History  Problem Relation Age of Onset   Sudden Cardiac Death Mother    CAD Father    CAD Paternal Grandmother    CAD Paternal Grandfather     Social History   Tobacco Use   Smoking status: Every Day    Packs/day: 1.50    Types: Cigarettes   Smokeless tobacco: Never  Vaping Use   Vaping Use: Never used  Substance Use Topics   Alcohol use: Not Currently   Drug use: Never    Home Medications Prior to Admission medications   Medication Sig Start Date End Date Taking? Authorizing Provider  cyclobenzaprine (FLEXERIL) 5 MG tablet Take 1 tablet (5 mg total) by mouth in the morning and at bedtime for 10 doses. 02/20/21 02/25/21 Yes Sakiyah Shur, DO  aspirin 81 MG chewable tablet Chew 81 mg by mouth daily.    [provider]  carvedilol (COREG) 3.125 MG tablet Take 1 tablet (3.125 mg total) by mouth 2 (two) times daily. 01/15/21   Bensimhon, Bevelyn Buckles, MD  empagliflozin (JARDIANCE) 25 MG TABS tablet Take 1 tablet (25 mg total) by mouth daily. 08/27/20   Clegg, Amy D, NP  furosemide (LASIX) 20 MG tablet Take 1 tablet (20 mg total) by mouth 3 (three) times a week. 01/16/21   Bensimhon, Bevelyn Buckles, MD  ketorolac (ACULAR) 0.5 % ophthalmic solution Place 1 drop into both eyes 4 (four) times daily. 11/07/20   [name], MD  metFORMIN (GLUCOPHAGE) 1000 MG tablet Take 1,000 mg by mouth daily in the afternoon.    [provider]  potassium chloride (KLOR-CON) 10 MEQ tablet Take 2 tablets (20 mEq total) by mouth daily. 01/15/21   Bensimhon, Bevelyn Buckles, MD  prednisoLONE acetate (PRED FORTE) 1 % ophthalmic suspension Place 1 drop into the left eye 4 (four) times daily. 08/30/20   [name], MD  rosuvastatin (CRESTOR) 20 MG tablet Take 1 tablet (20 mg total) by mouth daily. 01/15/21 04/15/21  Bensimhon, Bevelyn Buckles, MD  sacubitril-valsartan (ENTRESTO) 24-26 MG Take 1 tablet by mouth 2 (two) times daily. 01/15/21   Bensimhon, Bevelyn Buckles, MD  spironolactone (ALDACTONE)  25 MG tablet TAKE 1 TABLET BY MOUTH EVERY DAY 01/10/21   Bensimhon, Bevelyn Buckles, MD  TRULICITY 3 MG/0.5ML SOPN Inject 3 mg into the skin every Sunday. 08/29/20   [provider]    Allergies    Patient has no known allergies.  Review of Systems   Review of Systems  Constitutional:  Negative for chills and fever.  HENT:  Negative for ear pain and sore throat.   Eyes:  Negative for pain and visual disturbance.  Respiratory:  Negative for cough and shortness of breath.   Cardiovascular:  Positive for syncope. Negative for chest pain and palpitations.  Gastrointestinal:  Negative for abdominal  pain and vomiting.  Genitourinary:  Negative for dysuria and hematuria.  Musculoskeletal:  Positive for back pain. Negative for arthralgias.  Skin:  Negative for color change and rash.  Neurological:  Positive for syncope. Negative for seizures.  All other systems reviewed and are negative.  Physical Exam Updated Vital Signs BP 98/66 (BP Location: Left Arm)   Pulse 88   Temp 98.4 F (36.9 C) (Oral)   Resp 18   Ht 5\' 8"  (1.727 m)   Wt 79.4 kg   LMP  (LMP Unknown)   SpO2 98%   BMI 26.61 kg/m   Physical Exam Vitals and nursing note reviewed.  Constitutional:      General: She is not in acute distress.    Appearance: She is well-developed. She is not ill-appearing.  HENT:     Head: Normocephalic and atraumatic.     Nose: Nose normal.     Mouth/Throat:     Mouth: Mucous membranes are moist.  Eyes:     Extraocular Movements: Extraocular movements intact.     Conjunctiva/sclera: Conjunctivae normal.     Pupils: Pupils are equal, round, and reactive to light.  Cardiovascular:     Rate and Rhythm: Normal rate and regular rhythm.     Pulses: Normal pulses.     Heart sounds: Normal heart sounds. No murmur heard. Pulmonary:     Effort: Pulmonary effort is normal. No respiratory distress.     Breath sounds: Normal breath sounds.  Abdominal:     Palpations: Abdomen is soft.      Tenderness: There is no abdominal tenderness.  Musculoskeletal:        General: Tenderness present.     Cervical back: Normal range of motion and neck supple. No tenderness.     Comments: Tenderness to bilateral posterior ribs, no midline spinal tenderness  Skin:    General: Skin is warm and dry.  Neurological:     General: No focal deficit present.     Mental Status: She is alert and oriented to person, place, and time.     Cranial Nerves: No cranial nerve deficit.     Sensory: No sensory deficit.     Motor: No weakness.     Comments: 5+ out of 5 strength throughout, normal sensation, no drift, normal finger-to-nose finger  Psychiatric:        Mood and Affect: Mood normal.    ED Results / Procedures / Treatments   Labs (all labs ordered are listed, but only abnormal results are displayed) Labs Reviewed  CBC WITH DIFFERENTIAL/PLATELET - Abnormal; Notable for the following components:      Result Value   RBC 5.60 (*)    Hemoglobin 16.0 (*)    HCT 48.1 (*)    All other components within normal limits  COMPREHENSIVE METABOLIC PANEL - Abnormal; Notable for the following components:   Glucose, Bld 158 (*)    AST 8 (*)    All other components within normal limits  RESP PANEL BY RT-PCR (FLU A&B, COVID) ARPGX2  TROPONIN I (HIGH SENSITIVITY)  TROPONIN I (HIGH SENSITIVITY)    EKG EKG Interpretation  Date/Time:  Wednesday February 20 2021 18:07:18 EDT Ventricular Rate:  81 PR Interval:  183 QRS Duration: 98 QT Interval:  370 QTC Calculation: 430 R Axis:   73 Text Interpretation: Sinus rhythm Probable inferior infarct, old Anterolateral infarct, age indeterminate No significant change since last tracing Confirmed by [phone] [id number]) on 02/20/2021 6:19:38 PM  Radiology DG Chest 2  View  Result Date: 02/20/2021 CLINICAL DATA:  Fall.  Back pain and headache. EXAM: CHEST - 2 VIEW COMPARISON:  Chest x-ray 10/13/2019, CT chest 10/13/2019 FINDINGS: The heart and mediastinal  contours are within normal limits. No focal consolidation. No pulmonary edema. No pleural effusion. No pneumothorax. No acute osseous abnormality. IMPRESSION: No active cardiopulmonary disease. Electronically Signed   By: Tish Frederickson M.D.   On: 02/20/2021 18:38   DG Thoracic Spine 2 View  Result Date: 02/20/2021 CLINICAL DATA:  Status post fall.  back pain. EXAM: THORACIC SPINE 2 VIEWS COMPARISON:  None. FINDINGS: Markedly limited evaluation due to overlapping osseous structures and overlying soft tissues. there is no evidence of thoracic spine fracture. Alignment is normal. No other significant bone abnormalities are identified. Mild multilevel degenerative changes of the spine. IMPRESSION: No acute displaced fracture or traumatic listhesis of the thoracic spine. Markedly limited evaluation due to overlapping osseous structures and overlying soft tissues. Electronically Signed   By: Tish Frederickson M.D.   On: 02/20/2021 18:39   CT HEAD WO CONTRAST ( )  Result Date: 02/20/2021 CLINICAL DATA:  Status post fall EXAM: CT HEAD WITHOUT CONTRAST CT CERVICAL SPINE WITHOUT CONTRAST TECHNIQUE: Multidetector CT imaging of the head and cervical spine was performed following the standard protocol without intravenous contrast. Multiplanar CT image reconstructions of the cervical spine were also generated. COMPARISON:  CT head 01/07/2021 FINDINGS: CT HEAD FINDINGS Brain: No evidence of large-territorial acute infarction. No parenchymal hemorrhage. No mass lesion. No extra-axial collection. No mass effect or midline shift. No hydrocephalus. Basilar cisterns are patent. Vascular: No hyperdense vessel. Skull: No acute fracture or focal lesion. Sinuses/Orbits: Paranasal sinuses and mastoid air cells are clear. High density material in the bilateral globes, likely representing intra-ocular silicone oil related to prior retinal detachment surgical repair. Otherwise the orbits are unremarkable. Other: None. CT CERVICAL  SPINE FINDINGS Alignment: Normal. Skull base and vertebrae: No acute fracture. No aggressive appearing focal osseous lesion or focal pathologic process. Soft tissues and spinal canal: No prevertebral fluid or swelling. No visible canal hematoma. Upper chest: Unremarkable. Other: None. IMPRESSION: 1. No acute intracranial abnormality. 2. No acute displaced fracture or traumatic listhesis of the cervical spine. Electronically Signed   By: Tish Frederickson M.D.   On: 02/20/2021 18:45   CT Cervical Spine Wo Contrast  Result Date: 02/20/2021 CLINICAL DATA:  Status post fall EXAM: CT HEAD WITHOUT CONTRAST CT CERVICAL SPINE WITHOUT CONTRAST TECHNIQUE: Multidetector CT imaging of the head and cervical spine was performed following the standard protocol without intravenous contrast. Multiplanar CT image reconstructions of the cervical spine were also generated. COMPARISON:  CT head 01/07/2021 FINDINGS: CT HEAD FINDINGS Brain: No evidence of large-territorial acute infarction. No parenchymal hemorrhage. No mass lesion. No extra-axial collection. No mass effect or midline shift. No hydrocephalus. Basilar cisterns are patent. Vascular: No hyperdense vessel. Skull: No acute fracture or focal lesion. Sinuses/Orbits: Paranasal sinuses and mastoid air cells are clear. High density material in the bilateral globes, likely representing intra-ocular silicone oil related to prior retinal detachment surgical repair. Otherwise the orbits are unremarkable. Other: None. CT CERVICAL SPINE FINDINGS Alignment: Normal. Skull base and vertebrae: No acute fracture. No aggressive appearing focal osseous lesion or focal pathologic process. Soft tissues and spinal canal: No prevertebral fluid or swelling. No visible canal hematoma. Upper chest: Unremarkable. Other: None. IMPRESSION: 1. No acute intracranial abnormality. 2. No acute displaced fracture or traumatic listhesis of the cervical spine. Electronically Signed   By: Normajean Glasgow.D.  On: 02/20/2021 18:45    Procedures Procedures   Medications Ordered in ED Medications - No data to display   ED Course  I have reviewed the triage vital signs and the nursing notes.  Pertinent labs & imaging results that were available during my care of the patient were reviewed by me and considered in my medical decision making (see chart for details).    MDM Rules/Calculators/A&P                           Shelly [name] is here with back pain after syncopal event this morning.  History of orthostatic hypotension, heart failure.  Has somewhat daily episodes of lightheadedness and sometimes she will pass out.  She has fairly low blood pressure.  She got up out of bed this morning and passed out.  She has felt okay for the most part today this occurred about 12 hours ago but continued to have some midthoracic back pain was concerned about may be a rib injury.  Vital signs are normal here.  She has not had any chest pain or shortness of breath.  EKG shows sinus rhythm.  No ischemic changes.  Unchanged from prior EKG.  Troponin normal.  No significant anemia, electrolyte ab Mody, kidney injury.  CT scan of the head and neck is unremarkable.  Chest x-ray with no pneumothorax or rib fractures.  Overall we will prescribe her a muscle relaxant as suspect muscle spasm.  Understands return precautions and discharged in the ED in good condition.  This chart was dictated using voice recognition software.  Despite best efforts to proofread,  errors can occur which can change the documentation meaning.    Final Clinical Impression(s) / ED Diagnoses Final diagnoses:  Acute bilateral thoracic back pain  Vasovagal syncope    Rx / DC Orders ED Discharge Orders          Ordered    cyclobenzaprine (FLEXERIL) 5 MG tablet  2 times daily        02/20/21 1954             Virgina Norfolk, DO 02/20/21 1957

## 2021-03-08 ENCOUNTER — Encounter (INDEPENDENT_AMBULATORY_CARE_PROVIDER_SITE_OTHER): Payer: Self-pay | Admitting: Ophthalmology

## 2021-03-08 DIAGNOSIS — I10 Essential (primary) hypertension: Secondary | ICD-10-CM

## 2021-03-08 DIAGNOSIS — H25813 Combined forms of age-related cataract, bilateral: Secondary | ICD-10-CM

## 2021-03-08 DIAGNOSIS — H3343 Traction detachment of retina, bilateral: Secondary | ICD-10-CM

## 2021-03-08 DIAGNOSIS — H35033 Hypertensive retinopathy, bilateral: Secondary | ICD-10-CM

## 2021-03-08 DIAGNOSIS — H3581 Retinal edema: Secondary | ICD-10-CM

## 2021-03-08 DIAGNOSIS — E113513 Type 2 diabetes mellitus with proliferative diabetic retinopathy with macular edema, bilateral: Secondary | ICD-10-CM

## 2021-03-13 NOTE — Progress Notes (Signed)
Advanced Heart Failure Clinic Note    PCP: Dema Severin, NP  Cardiologist: Dr. Eldridge Dace HF: Dr. Gala Romney   HPI: Shelly [name] is a 50 y.o. woman with DM2, tobacco abuse, CAD and systolic HF diagnosed in 5/21 with EF 20%.  Admitted 5/21 with acute HF. Echo EF 20% with severe RV dysfunction. R/LHC showed diffuse non-obstructive CAD with chronic dissection/recannalization of mid LAD. Suspect she had an out of hospital MI at some point.  There were no interventional targets.  Outpatient cMRI 8/21 1.  Mild LV dilatation with severe systolic dysfunction (LVEF 30%) 2. Subendocardial LGE c/w prior LAD and RCA infarcts. >50% transmurality of LGE suggesting nonviability in basal to mid inferior walls, apical anterior/septal walls, and apex. <50% transmurality of LGE suggesting viability in mid anterior wall. 3.  Normal RV size and systolic function (EF 66%)  Follow up 8/22 GDMT had been limited by low BP, still with feelings like she is going to pass out. Echo 01/15/21:  EF 35-40% RV ok. Entresto decreased, lasix cut back to MWF.  Evaluated in ED 02/20/21 for LOC. Felt to be orthostatic, CT head negative.  Today she returns for HF follow up. She gets dizzy with changes positions. No significant SOB with ADLs but not very active with her poor eye sight. Denies CP, edema, or PND/Orthopnea. Appetite ok. No fever or chills. Weight at home 173-175 pounds. Taking all medications. Diarrhea stopped after stopping Crestor. Took an extra lasix last week. Blood sugars ~130s. Smoking about 1 ppd. Says she has passed out about 20x since last year.  Cardiac Studies: Echo (8/22): EF 35-40% RV ok.  Echo 5/21: EF 20% with severe RV dysfunction.  RHC 5/21 Ao = 106/75 (87) LV = 107/27 RA = 15 RV = 54/16 PA = 57/25 (40) PCW = 27 (v = 37) Fick cardiac output/index = 4.4/2.0 PVR = 1.4 WU SVR = 1297 Ao sat = 99% PA sat = 62%, 63%  Past Medical History:  Diagnosis Date   Anemia    Anxiety    Arthritis     Cataract    Mixed OU   CHF (congestive heart failure) (HCC)    Coronary artery disease    Diabetes mellitus without complication (HCC)    Diabetic retinopathy (HCC)    PDR OU   Headache    Cluster headaches in the past   History of kidney stones    "2 sitting"   Hypertension    Hypertensive retinopathy    OU   Myocardial infarction (HCC)    Pneumonia    Tobacco abuse    Current Outpatient Medications  Medication Sig Dispense Refill   aspirin 81 MG chewable tablet Chew 81 mg by mouth daily.     carvedilol (COREG) 3.125 MG tablet Take 1 tablet (3.125 mg total) by mouth 2 (two) times daily. 60 tablet 3   empagliflozin (JARDIANCE) 25 MG TABS tablet Take 1 tablet (25 mg total) by mouth daily. 90 tablet 3   furosemide (LASIX) 20 MG tablet Take 1 tablet (20 mg total) by mouth 3 (three) times a week. 30 tablet 3   ketorolac (ACULAR) 0.5 % ophthalmic solution Place 1 drop into both eyes 4 (four) times daily. 10 mL 2   metFORMIN (GLUCOPHAGE) 1000 MG tablet Take 1,000 mg by mouth daily in the afternoon.     potassium chloride (KLOR-CON) 10 MEQ tablet Take 2 tablets (20 mEq total) by mouth daily. 60 tablet 3   prednisoLONE acetate (PRED FORTE) 1 %  ophthalmic suspension Place 1 drop into the left eye 4 (four) times daily. 15 mL 0   sacubitril-valsartan (ENTRESTO) 24-26 MG Take 1 tablet by mouth 2 (two) times daily. 60 tablet 3   spironolactone (ALDACTONE) 25 MG tablet TAKE 1 TABLET BY MOUTH EVERY DAY 90 tablet 3   TRULICITY 3 MG/0.5ML SOPN Inject 3 mg into the skin every Sunday.     No current facility-administered medications for this encounter.   No Known Allergies  Social History   Socioeconomic History   Marital status: Married    Spouse name: Not on file   Number of children: Not on file   Years of education: Not on file   Highest education level: Not on file  Occupational History   Not on file  Tobacco Use   Smoking status: Every Day    Packs/day: 1.50    Types:  Cigarettes   Smokeless tobacco: Never  Vaping Use   Vaping Use: Never used  Substance and Sexual Activity   Alcohol use: Not Currently   Drug use: Never   Sexual activity: Not on file  Other Topics Concern   Not on file  Social History Narrative   Not on file   Social Determinants of Health   Financial Resource Strain: Not on file  Food Insecurity: Not on file  Transportation Needs: Not on file  Physical Activity: Not on file  Stress: Not on file  Social Connections: Not on file  Intimate Partner Violence: Not on file   Family History  Problem Relation Age of Onset   Sudden Cardiac Death Mother    CAD Father    CAD Paternal Grandmother    CAD Paternal Grandfather    BP 120/60   Pulse 78   Wt 82.2 kg (181 lb 3.2 oz)   LMP  (LMP Unknown)   SpO2 99%   BMI 27.55 kg/m   Wt Readings from Last 3 Encounters:  03/19/21 82.2 kg (181 lb 3.2 oz)  02/20/21 79.4 kg (175 lb)  01/15/21 80.6 kg (177 lb 9.6 oz)   PHYSICAL EXAM: General:  NAD. No resp difficulty, arrived in [facility], appears older than stated age HEENT: Normal, poor eyesight. Neck: Supple. No JVD. Carotids 2+ bilat; no bruits. No lymphadenopathy or thryomegaly appreciated. Cor: PMI nondisplaced. Regular rate & rhythm. No rubs, gallops or murmurs. Lungs: Decreased throughout. Abdomen: Soft, nontender, nondistended. No hepatosplenomegaly. No bruits or masses. Good bowel sounds. Extremities: No cyanosis, clubbing, rash, edema Neuro: Alert & oriented x 3, cranial nerves grossly intact. Moves all 4 extremities w/o difficulty. Affect pleasant.  ReDs: 37%  ECG: SR with PCV 93 bpm, lateral TWI (personally reviewed).   ASSESSMENT & PLAN: 1. 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 R/LHC showed diffuse non-obstructive CAD with chronic dissection/recannalization of mid LAD. Suspect she had an out of hospital MI at some point.  There were no interventional targets. - cMRI  8/21: LVEF 30% priro LAD & RCA infarcts. RV ok  - Echo 01/15/21:  EF 35-40% RV ok  - Stable NYHA II.  Volume OK today, ReDs 37%.  - Continue Entresto 24/26 mg bid.  - Continue spiro 25 mg daily. - Continue Coreg 3.125 mg bid. - Continue Jardiance 25 mg daily.  - Continue lasix M/W/F + as needed. - BMET today.  2. CAD: - LHC 5/21 showed diffuse non-obstructive CAD with chronic dissection/recannalization of mid LAD. Suspect she had an out of hospital  MI at some point.  There were no interventional targets. - cMRI as above c/w prior infarcts in LAD/RCA territories - No s/s angina. - Continue ASA. - Off Crestor and atorva with GI upset. - Referred to Lipid Clinic. - Needs complete smoking cessation.  3. Syncope - Likely orthostatic but will place Zio 14 day to assess for arrhythmia. - Discussed wearing compression hose. - ECG today SR with PVC - May need to stop Entresto and change to losartan. - Consider sleep study.  4. Type 2 DM:  - Management per PCP. - Continue Metformin + Jardiance. - HgBA1c down to 6.5%.  5. Tobacco Abuse: - Discussed smoking cessation.  - She is not ready to quit.  6. HLD:  - LDL 134 on FLP 8/22, goal TMH<96.  - Unable to tolerate statins due to GI upset. - Lipid Clinic appt scheduled.  Follow up in 6 weeks with APP and 4 months with Dr. Gala Romney.   Shelly [name] [place], FNP 03/19/21

## 2021-03-19 ENCOUNTER — Encounter (HOSPITAL_COMMUNITY): Payer: Self-pay

## 2021-03-19 ENCOUNTER — Ambulatory Visit (HOSPITAL_COMMUNITY)
Admission: RE | Admit: 2021-03-19 | Discharge: 2021-03-19 | Disposition: A | Discharge status: Home / Self Care | Payer: 59 | Source: Ambulatory Visit | Attending: Family Medicine | Admitting: Family Medicine

## 2021-03-19 ENCOUNTER — Other Ambulatory Visit: Payer: Self-pay

## 2021-03-19 VITALS — BP 120/60 | HR 78 | Wt 181.2 lb

## 2021-03-19 DIAGNOSIS — E785 Hyperlipidemia, unspecified: Secondary | ICD-10-CM | POA: Insufficient documentation

## 2021-03-19 DIAGNOSIS — Z794 Long term (current) use of insulin: Secondary | ICD-10-CM

## 2021-03-19 DIAGNOSIS — Z791 Long term (current) use of non-steroidal anti-inflammatories (NSAID): Secondary | ICD-10-CM | POA: Insufficient documentation

## 2021-03-19 DIAGNOSIS — E1139 Type 2 diabetes mellitus with other diabetic ophthalmic complication: Secondary | ICD-10-CM

## 2021-03-19 DIAGNOSIS — R55 Syncope and collapse: Secondary | ICD-10-CM | POA: Diagnosis not present

## 2021-03-19 DIAGNOSIS — Z7982 Long term (current) use of aspirin: Secondary | ICD-10-CM | POA: Insufficient documentation

## 2021-03-19 DIAGNOSIS — I2510 Atherosclerotic heart disease of native coronary artery without angina pectoris: Secondary | ICD-10-CM | POA: Insufficient documentation

## 2021-03-19 DIAGNOSIS — Z7984 Long term (current) use of oral hypoglycemic drugs: Secondary | ICD-10-CM | POA: Diagnosis not present

## 2021-03-19 DIAGNOSIS — E1136 Type 2 diabetes mellitus with diabetic cataract: Secondary | ICD-10-CM | POA: Insufficient documentation

## 2021-03-19 DIAGNOSIS — Z79899 Other long term (current) drug therapy: Secondary | ICD-10-CM | POA: Insufficient documentation

## 2021-03-19 DIAGNOSIS — I5022 Chronic systolic (congestive) heart failure: Secondary | ICD-10-CM | POA: Diagnosis not present

## 2021-03-19 DIAGNOSIS — F17210 Nicotine dependence, cigarettes, uncomplicated: Secondary | ICD-10-CM | POA: Insufficient documentation

## 2021-03-19 DIAGNOSIS — R42 Dizziness and giddiness: Secondary | ICD-10-CM | POA: Diagnosis present

## 2021-03-19 DIAGNOSIS — Z8249 Family history of ischemic heart disease and other diseases of the circulatory system: Secondary | ICD-10-CM | POA: Diagnosis not present

## 2021-03-19 DIAGNOSIS — Z7985 Long-term (current) use of injectable non-insulin antidiabetic drugs: Secondary | ICD-10-CM | POA: Diagnosis not present

## 2021-03-19 DIAGNOSIS — E11319 Type 2 diabetes mellitus with unspecified diabetic retinopathy without macular edema: Secondary | ICD-10-CM | POA: Diagnosis not present

## 2021-03-19 DIAGNOSIS — Z7901 Long term (current) use of anticoagulants: Secondary | ICD-10-CM | POA: Insufficient documentation

## 2021-03-19 DIAGNOSIS — I252 Old myocardial infarction: Secondary | ICD-10-CM | POA: Insufficient documentation

## 2021-03-19 DIAGNOSIS — I110 Hypertensive heart disease with heart failure: Secondary | ICD-10-CM | POA: Diagnosis not present

## 2021-03-19 DIAGNOSIS — Z720 Tobacco use: Secondary | ICD-10-CM

## 2021-03-19 LAB — BASIC METABOLIC PANEL
Anion gap: 8 (ref 5–15)
BUN: 6 mg/dL (ref 6–20)
CO2: 30 mmol/L (ref 22–32)
Calcium: 9.1 mg/dL (ref 8.9–10.3)
Chloride: 101 mmol/L (ref 98–111)
Creatinine, Ser: 0.8 mg/dL (ref 0.44–1.00)
GFR, Estimated: >60 mL/min (ref >60)
Glucose, Bld: 109 mg/dL — ABNORMAL HIGH (ref 70–99)
Potassium: 3.7 mmol/L (ref 3.5–5.1)
Sodium: 139 mmol/L (ref 135–145)

## 2021-03-19 NOTE — Patient Instructions (Signed)
EKG was done today  Labs were done today, if any labs are abnormal the clinic will call  ZIO patch is on and you have been given the information needed, this will be on for 14 days  Your physician recommends that you schedule a follow-up appointment in: 6 weeks and   4 months  At the [facility], you and your health needs are our priority. As part of our continuing mission to provide you with exceptional heart care, we have created designated Provider Care Teams. These Care Teams include your primary Cardiologist (physician) and Advanced Practice Providers (APPs- Physician Assistants and Nurse Practitioners) who all work together to provide you with the care you need, when you need it.   You may see any of the following providers on your designated Care Team at your next follow up: Dr Arvilla Meres Dr Marca Ancona Dr Brandon Melnick, NP Robbie Lis, [place] Mikki Santee Karle Plumber, PharmD   Please be sure to bring in all your medications bottles to every appointment.    If you have any questions or concerns before your next appointment please send [place] a message through [place] or call our office at [phone].    TO LEAVE A MESSAGE FOR THE NURSE SELECT OPTION 2, PLEASE LEAVE A MESSAGE INCLUDING: YOUR NAME DATE OF BIRTH CALL BACK NUMBER REASON FOR CALL**this is important as we prioritize the call backs  YOU WILL RECEIVE A CALL BACK THE SAME DAY AS LONG AS YOU CALL BEFORE 4:00 PM

## 2021-04-18 ENCOUNTER — Other Ambulatory Visit (HOSPITAL_COMMUNITY): Payer: Self-pay | Admitting: Internal Medicine

## 2021-04-22 NOTE — Progress Notes (Signed)
[facility] Clinic Note    PCP: Dema Severin, NP  Cardiologist: Dr. Eldridge Dace HF: Dr. Gala Romney   HPI: Shelly [name] is a 50 y.o. woman with DM2, tobacco abuse, CAD and systolic HF diagnosed in 5/21 with EF 20%.  Admitted 5/21 with acute HF. Echo EF 20% with severe RV dysfunction. R/LHC showed diffuse non-obstructive CAD with chronic dissection/recannalization of mid LAD. Suspect she had an out of hospital MI at some point.  There were no interventional targets.  Outpatient cMRI 8/21 1.  Mild LV dilatation with severe systolic dysfunction (LVEF 30%) 2. Subendocardial LGE c/w prior LAD and RCA infarcts. >50% transmurality of LGE suggesting nonviability in basal to mid inferior walls, apical anterior/septal walls, and apex. <50% transmurality of LGE suggesting viability in mid anterior wall. 3.  Normal RV size and systolic function (EF 66%)  Follow up 8/22 GDMT had been limited by low BP, still with feelings like she is going to pass out. Echo 01/15/21:  EF 35-40% RV ok. Entresto decreased, lasix cut back to MWF.  Evaluated in ED 02/20/21 for LOC. Felt to be orthostatic, CT head negative.  Zio-14 day (only able to wear for 4) showed mostly SR, no high-grade arrhythmias.  Today she returns for HF follow up. Dizziness is a little better since last visit. Overall feeling fine. No significant SOB with ADLs, but not very active with her poor eye sight. If she notices swelling in feet/ankles, she will take extra Lasix tablet. Denies CP, palpitations, edema, or PND/Orthopnea. Appetite ok. No fever or chills. Weight at home 185 pounds. Taking all medications.   Cardiac Studies: Echo (8/22): EF 35-40% RV ok.  Echo 5/21: EF 20% with severe RV dysfunction.  RHC 5/21 Ao = 106/75 (87) LV = 107/27 RA = 15 RV = 54/16 PA = 57/25 (40) PCW = 27 (v = 37) Fick cardiac output/index = 4.4/2.0 PVR = 1.4 WU SVR = 1297 Ao sat = 99% PA sat = 62%, 63%  Past Medical History:  Diagnosis Date    Anemia    Anxiety    Arthritis    Cataract    Mixed OU   CHF (congestive heart failure) (HCC)    Coronary artery disease    Diabetes mellitus without complication (HCC)    Diabetic retinopathy (HCC)    PDR OU   Headache    Cluster headaches in the past   History of kidney stones    "2 sitting"   Hypertension    Hypertensive retinopathy    OU   Myocardial infarction (HCC)    Pneumonia    Tobacco abuse    Current Outpatient Medications  Medication Sig Dispense Refill   aspirin 81 MG chewable tablet Chew 81 mg by mouth daily.     carvedilol (COREG) 3.125 MG tablet Take 1 tablet (3.125 mg total) by mouth 2 (two) times daily. 60 tablet 3   empagliflozin (JARDIANCE) 25 MG TABS tablet Take 1 tablet (25 mg total) by mouth daily. 90 tablet 3   furosemide (LASIX) 20 MG tablet Take 1 tablet (20 mg total) by mouth 3 (three) times a week. 30 tablet 3   ketorolac (ACULAR) 0.5 % ophthalmic solution Place 1 drop into both eyes 4 (four) times daily. 10 mL 2   KLOR-CON M10 10 MEQ tablet TAKE 2 TABLETS BY MOUTH DAILY 180 tablet 3   metFORMIN (GLUCOPHAGE) 1000 MG tablet Take 1,000 mg by mouth daily in the afternoon.     prednisoLONE acetate (PRED  FORTE) 1 % ophthalmic suspension Place 1 drop into the left eye 4 (four) times daily. 15 mL 0   sacubitril-valsartan (ENTRESTO) 24-26 MG Take 1 tablet by mouth 2 (two) times daily. 60 tablet 3   spironolactone (ALDACTONE) 25 MG tablet TAKE 1 TABLET BY MOUTH EVERY DAY 90 tablet 3   traMADol (ULTRAM) 50 MG tablet Take 50 mg by mouth 3 (three) times daily as needed.     TRULICITY 3 MG/0.5ML SOPN Inject 3 mg into the skin every Sunday.     No current facility-administered medications for this encounter.   No Known Allergies  Social History   Socioeconomic History   Marital status: Married    Spouse name: Not on file   Number of children: Not on file   Years of education: Not on file   Highest education level: Not on file  Occupational History    Not on file  Tobacco Use   Smoking status: Every Day    Packs/day: 1.50    Types: Cigarettes   Smokeless tobacco: Never  Vaping Use   Vaping Use: Never used  Substance and Sexual Activity   Alcohol use: Not Currently   Drug use: Never   Sexual activity: Not on file  Other Topics Concern   Not on file  Social History Narrative   Not on file   Social Determinants of Health   Financial Resource Strain: Not on file  Food Insecurity: Not on file  Transportation Needs: Not on file  Physical Activity: Not on file  Stress: Not on file  Social Connections: Not on file  Intimate Partner Violence: Not on file   Family History  Problem Relation Age of Onset   Sudden Cardiac Death Mother    CAD Father    CAD Paternal Grandmother    CAD Paternal Grandfather    BP 118/70   Pulse (!) 102   Wt 83.5 kg (184 lb)   LMP  (LMP Unknown)   SpO2 98%   BMI 27.98 kg/m   Wt Readings from Last 3 Encounters:  04/23/21 83.5 kg (184 lb)  03/19/21 82.2 kg (181 lb 3.2 oz)  02/20/21 79.4 kg (175 lb)   PHYSICAL EXAM: General:  NAD. No resp difficulty, appears older than stated age HEENT: Normal Neck: Supple. No JVD. Carotids 2+ bilat; no bruits. No lymphadenopathy or thryomegaly appreciated. Cor: PMI nondisplaced. Regular rate & rhythm. No rubs, gallops or murmurs. Lungs: Clear Abdomen: Soft, nontender, nondistended. No hepatosplenomegaly. No bruits or masses. Good bowel sounds. Extremities: No cyanosis, clubbing, rash, edema Neuro: Alert & oriented x 3, cranial nerves grossly intact. Moves all 4 extremities w/o difficulty. Affect pleasant.  ReDs: 24%   ASSESSMENT & PLAN: 1. 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 R/LHC showed diffuse non-obstructive CAD with chronic dissection/recannalization of mid LAD. Suspect she had an out of hospital MI at some point.  There were no interventional targets. - cMRI 8/21: LVEF 30% priro LAD & RCA  infarcts. RV ok  - Echo 01/15/21:  EF 35-40% RV ok  - Stable NYHA II. Volume OK today, ReDs 24%.  - Continue Entresto 24/26 mg bid.  - Continue spiro 25 mg daily. - Continue Coreg 3.125 mg bid. - Continue Jardiance 25 mg daily.  - Continue lasix M/W/F + as needed. - BMET today.  2. CAD: - LHC 5/21 showed diffuse non-obstructive CAD with chronic dissection/recannalization of mid LAD. Suspect she had an out  of hospital MI at some point.  There were no interventional targets. - cMRI as above c/w prior infarcts in LAD/RCA territories - No s/s angina. - Continue ASA. - Off Crestor and atorva with GI upset. - Referred to Lipid Clinic. - Needs complete smoking cessation.  3. Syncope - Likely orthostatic, no further events. - Zio 4 day (10/22): showed mostly SR, no high grade arrhythmia - Discussed wearing compression hose. She will think about it. - Consider sleep study. - Consider stopping Entresto and change to losartan if symptoms recur. - Advised follow up w/ PCP for vertigo work up.  4. Type 2 DM:  - Management per PCP. - Continue Jardiance. - HgBA1c down to 6.5%.  5. Tobacco Abuse: - Discussed smoking cessation.  - She is not ready to quit.  6. HLD:  - LDL 134 on FLP 8/22, goal LXB<26.  - Unable to tolerate statins due to GI upset. - Lipid Clinic appt scheduled.  Follow up in 3 months with Dr. Gala Romney as scheduled.  Anderson Malta [place], FNP 04/23/21

## 2021-04-23 ENCOUNTER — Encounter (HOSPITAL_COMMUNITY): Payer: Self-pay

## 2021-04-23 ENCOUNTER — Other Ambulatory Visit: Payer: Self-pay

## 2021-04-23 ENCOUNTER — Ambulatory Visit (HOSPITAL_COMMUNITY)
Admission: RE | Admit: 2021-04-23 | Discharge: 2021-04-23 | Disposition: A | Discharge status: Home / Self Care | Payer: 59 | Source: Ambulatory Visit | Attending: Adult Health | Admitting: Adult Health

## 2021-04-23 VITALS — BP 118/70 | HR 102 | Wt 184.0 lb

## 2021-04-23 DIAGNOSIS — F17210 Nicotine dependence, cigarettes, uncomplicated: Secondary | ICD-10-CM | POA: Diagnosis not present

## 2021-04-23 DIAGNOSIS — Z794 Long term (current) use of insulin: Secondary | ICD-10-CM

## 2021-04-23 DIAGNOSIS — I2510 Atherosclerotic heart disease of native coronary artery without angina pectoris: Secondary | ICD-10-CM | POA: Diagnosis not present

## 2021-04-23 DIAGNOSIS — E1139 Type 2 diabetes mellitus with other diabetic ophthalmic complication: Secondary | ICD-10-CM

## 2021-04-23 DIAGNOSIS — I110 Hypertensive heart disease with heart failure: Secondary | ICD-10-CM | POA: Diagnosis not present

## 2021-04-23 DIAGNOSIS — R55 Syncope and collapse: Secondary | ICD-10-CM

## 2021-04-23 DIAGNOSIS — E119 Type 2 diabetes mellitus without complications: Secondary | ICD-10-CM | POA: Diagnosis not present

## 2021-04-23 DIAGNOSIS — I5022 Chronic systolic (congestive) heart failure: Secondary | ICD-10-CM | POA: Insufficient documentation

## 2021-04-23 DIAGNOSIS — Z7984 Long term (current) use of oral hypoglycemic drugs: Secondary | ICD-10-CM | POA: Diagnosis not present

## 2021-04-23 DIAGNOSIS — I255 Ischemic cardiomyopathy: Secondary | ICD-10-CM | POA: Diagnosis not present

## 2021-04-23 DIAGNOSIS — E785 Hyperlipidemia, unspecified: Secondary | ICD-10-CM | POA: Insufficient documentation

## 2021-04-23 DIAGNOSIS — Z79899 Other long term (current) drug therapy: Secondary | ICD-10-CM | POA: Diagnosis not present

## 2021-04-23 DIAGNOSIS — I509 Heart failure, unspecified: Secondary | ICD-10-CM

## 2021-04-23 DIAGNOSIS — Z720 Tobacco use: Secondary | ICD-10-CM

## 2021-04-23 LAB — BASIC METABOLIC PANEL
Anion gap: 7 (ref 5–15)
BUN: 7 mg/dL (ref 6–20)
CO2: 27 mmol/L (ref 22–32)
Calcium: 8.8 mg/dL — ABNORMAL LOW (ref 8.9–10.3)
Chloride: 101 mmol/L (ref 98–111)
Creatinine, Ser: 0.87 mg/dL (ref 0.44–1.00)
GFR, Estimated: >60 mL/min (ref >60)
Glucose, Bld: 157 mg/dL — ABNORMAL HIGH (ref 70–99)
Potassium: 3.6 mmol/L (ref 3.5–5.1)
Sodium: 135 mmol/L (ref 135–145)

## 2021-04-23 NOTE — Progress Notes (Signed)
ReDS Vest / Clip - 04/23/21 1400       ReDS Vest / Clip   Station Marker C    Ruler Value 30.5    ReDS Value Range Low volume    ReDS Actual Value 24

## 2021-04-23 NOTE — Patient Instructions (Signed)
It was great to see you today! No medication changes are needed at this time.   Labs today We will only contact you if something comes back abnormal or we need to make some changes. Otherwise no news is good news!  Keep follow up as scheduled   Do the following things EVERYDAY: Weigh yourself in the morning before breakfast. Write it down and keep it in a log. Take your medicines as prescribed Eat low salt foods--Limit salt (sodium) to 2000 mg per day.  Stay as active as you can everyday Limit all fluids for the day to less than 2 liters  At the [facility], you and your health needs are our priority. As part of our continuing mission to provide you with exceptional heart care, we have created designated Provider Care Teams. These Care Teams include your primary Cardiologist (physician) and Advanced Practice Providers (APPs- Physician Assistants and Nurse Practitioners) who all work together to provide you with the care you need, when you need it.   You may see any of the following providers on your designated Care Team at your next follow up: Dr Daniel Bensimhon Dr Dalton McLean Amy Clegg, NP Brittainy Simmons, PA Jessica Milford,NP Lindsay Finch, PA Lauren Kemp, PharmD   Please be sure to bring in all your medications bottles to every appointment.   If you have any questions or concerns before your next appointment please send us a message through mychart or call our office at [phone].    TO LEAVE A MESSAGE FOR THE NURSE SELECT OPTION 2, PLEASE LEAVE A MESSAGE INCLUDING: YOUR NAME DATE OF BIRTH CALL BACK NUMBER REASON FOR CALL**this is important as we prioritize the call backs  YOU WILL RECEIVE A CALL BACK THE SAME DAY AS LONG AS YOU CALL BEFORE 4:00 PM  

## 2021-05-22 ENCOUNTER — Telehealth: Payer: Self-pay | Admitting: Internal Medicine

## 2021-05-22 ENCOUNTER — Encounter: Payer: Self-pay | Admitting: Internal Medicine

## 2021-05-22 ENCOUNTER — Other Ambulatory Visit: Payer: Self-pay

## 2021-05-22 ENCOUNTER — Telehealth (INDEPENDENT_AMBULATORY_CARE_PROVIDER_SITE_OTHER): Payer: 59 | Admitting: Internal Medicine

## 2021-05-22 VITALS — BP 112/68 | Wt 179.0 lb

## 2021-05-22 DIAGNOSIS — I5022 Chronic systolic (congestive) heart failure: Secondary | ICD-10-CM

## 2021-05-22 DIAGNOSIS — E785 Hyperlipidemia, unspecified: Secondary | ICD-10-CM

## 2021-05-22 DIAGNOSIS — Z794 Long term (current) use of insulin: Secondary | ICD-10-CM

## 2021-05-22 DIAGNOSIS — Z720 Tobacco use: Secondary | ICD-10-CM

## 2021-05-22 DIAGNOSIS — E1139 Type 2 diabetes mellitus with other diabetic ophthalmic complication: Secondary | ICD-10-CM | POA: Diagnosis not present

## 2021-05-22 DIAGNOSIS — Z789 Other specified health status: Secondary | ICD-10-CM

## 2021-05-22 NOTE — Progress Notes (Signed)
Virtual Visit via Video Note   This visit type was conducted due to national recommendations for restrictions regarding the COVID-19 Pandemic (e.g. social distancing) in an effort to limit this patient's exposure and mitigate transmission in our community.  Due to her co-morbid illnesses, this patient is at least at moderate risk for complications without adequate follow up.  This format is felt to be most appropriate for this patient at this time.  All issues noted in this document were discussed and addressed.  A limited physical exam was performed with this format.  Please refer to the patient's chart for her consent to telehealth for [facility].      Date:  05/22/2021   ID:  Cheri Guppy, DOB Oct 22, 1970, MRN [id number] The patient was identified using 2 identifiers.  Evaluation Performed:  New Patient Evaluation  Patient Location:  [address] [place] [address]  Provider location:   [address], [address] [place], [place] [address]  PCP:  Dema Severin, NP  Cardiologist:  None Electrophysiologist:  None   Chief Complaint:  Manage dyslipidemia  History of Present Illness:    CAIDENCE KASEMAN is a 50 y.o. female who presents via audio/video conferencing for a telehealth visit today.  This is a pleasant 50 year old female patient of Dr. Gala Romney kindly referred from the advanced heart failure clinic for evaluation management of dyslipidemia.  She has a history of ischemic cardiomyopathy and presumed prior out of hospital MI in the past with LVEF as low as 20%, more recently up to 40%.  She has dyslipidemia as well and has been trialed on rosuvastatin and atorvastatin, both of which caused her terrible diarrhea.  She had similar issues on metformin and currently is on Jardiance.  She had a recent lipid profile which showed an LDL of 134 in August and is above target LDL less than 70.  Specifically, her total cholesterol was 206, HDL 42, triglycerides 146 and LDL 134.   She is referred for evaluation and consideration of possible PCSK9 inhibitor  The patient does not have symptoms concerning for COVID-19 infection (fever, chills, cough, or new SHORTNESS OF BREATH).    Prior CV studies:   The following studies were reviewed today:  Chart reviewed, lab work  PMHx:  Past Medical History:  Diagnosis Date   Anemia    Anxiety    Arthritis    Cataract    Mixed OU   CHF (congestive heart failure) (HCC)    Coronary artery disease    Diabetes mellitus without complication (HCC)    Diabetic retinopathy (HCC)    PDR OU   Headache    Cluster headaches in the past   History of kidney stones    "2 sitting"   Hypertension    Hypertensive retinopathy    OU   Myocardial infarction (HCC)    Pneumonia    Tobacco abuse     Past Surgical History:  Procedure Laterality Date   EYE SURGERY Left 08/23/2020   PPV - Dr. [name]   INJECTION OF SILICONE OIL Left 08/23/2020   Procedure: INJECTION OF SILICONE OIL;  Surgeon: [name], MD;  Location: [facility] OR;  Service: Ophthalmology;  Laterality: Left;   INJECTION OF SILICONE OIL Right 10/04/2020   Procedure: INJECTION OF SILICONE OIL;  Surgeon: [name], MD;  Location: [facility] OR;  Service: Ophthalmology;  Laterality: Right;   MEMBRANE PEEL Left 08/23/2020   Procedure: MEMBRANE PEEL;  Surgeon: [name], MD;  Location: [facility]  OR;  Service: Ophthalmology;  Laterality: Left;   MEMBRANE PEEL Right 10/04/2020   Procedure: MEMBRANE PEEL;  Surgeon: [name], MD;  Location: [facility] OR;  Service: Ophthalmology;  Laterality: Right;   PARS PLANA VITRECTOMY Left 08/23/2020   Procedure: PARS PLANA VITRECTOMY WITH 25 GAUGE WITH ENDOLASER;  Surgeon: [name], MD;  Location: [facility] OR;  Service: Ophthalmology;  Laterality: Left;   PARS PLANA VITRECTOMY Right 10/04/2020   Procedure: PARS PLANA VITRECTOMY WITH 25 GAUGE, INJECTION OF AVASTIN;  Surgeon: [name], MD;  Location: [facility] OR;  Service: Ophthalmology;  Laterality:  Right;   PHOTOCOAGULATION WITH LASER Right 10/04/2020   Procedure: PHOTOCOAGULATION WITH LASER;  Surgeon: [name], MD;  Location: [facility] OR;  Service: Ophthalmology;  Laterality: Right;   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/23/2020   Procedure: REPAIR OF COMPLEX TRACTION RETINAL DETACHMENT;  Surgeon: [name], MD;  Location: [facility] OR;  Service: Ophthalmology;  Laterality: Left;   RIGHT/LEFT HEART CATH AND CORONARY ANGIOGRAPHY N/A 10/14/2019   Procedure: RIGHT/LEFT HEART CATH AND CORONARY ANGIOGRAPHY;  Surgeon: Dolores Patty, MD;  Location: [facility] INVASIVE CV LAB;  Service: Cardiovascular;  Laterality: N/A;    FAMHx:  Family History  Problem Relation Age of Onset   Sudden Cardiac Death Mother    CAD Father    CAD Paternal Grandmother    CAD Paternal Grandfather     SOCHx:   reports that she has been smoking cigarettes. She has been smoking an average of 1.5 packs per day. She has never used smokeless tobacco. She reports that she does not currently use alcohol. She reports that she does not use drugs.  ALLERGIES:  Allergies  Allergen Reactions   Atorvastatin     diarrhea   Rosuvastatin     diarrhea    MEDS:  Current Meds  Medication Sig   aspirin 81 MG chewable tablet Chew 81 mg by mouth daily.   carvedilol (COREG) 3.125 MG tablet Take 1 tablet (3.125 mg total) by mouth 2 (two) times daily.   empagliflozin (JARDIANCE) 25 MG TABS tablet Take 1 tablet (25 mg total) by mouth daily.   furosemide (LASIX) 20 MG tablet Take 1 tablet (20 mg total) by mouth 3 (three) times a week.   ketorolac (ACULAR) 0.5 % ophthalmic solution Place 1 drop into both eyes 4 (four) times daily.   KLOR-CON M10 10 MEQ tablet TAKE 2 TABLETS BY MOUTH DAILY   metFORMIN (GLUCOPHAGE) 1000 MG tablet Take 1,000 mg by mouth daily in the afternoon.   prednisoLONE acetate (PRED FORTE) 1 % ophthalmic suspension Place 1 drop into the left eye 4 (four) times daily.   sacubitril-valsartan (ENTRESTO)  24-26 MG Take 1 tablet by mouth 2 (two) times daily.   spironolactone (ALDACTONE) 25 MG tablet TAKE 1 TABLET BY MOUTH EVERY DAY   traMADol (ULTRAM) 50 MG tablet Take 50 mg by mouth 3 (three) times daily as needed.   TRULICITY 3 MG/0.5ML SOPN Inject 3 mg into the skin every Sunday.     ROS: Pertinent items noted in HPI and remainder of comprehensive ROS otherwise negative.  Labs/Other Tests and Data Reviewed:    Recent Labs: 07/02/2020: B Natriuretic Peptide 551.5 02/20/2021: ALT 7; Hemoglobin 16.0; Platelets 297 04/23/2021: BUN 7; Creatinine, Ser 0.87; Potassium 3.6; Sodium 135   Recent Lipid Panel Lab Results  Component Value Date/Time   CHOL 113 05/01/2020 10:59 AM   TRIG 185 (H) 05/01/2020 10:59 AM   HDL 32 (L) 05/01/2020 10:59 AM  CHOLHDL 3.5 05/01/2020 10:59 AM   LDLCALC 44 05/01/2020 10:59 AM    Wt Readings from Last 3 Encounters:  05/22/21 179 lb (81.2 kg)  04/23/21 184 lb (83.5 kg)  03/19/21 181 lb 3.2 oz (82.2 kg)     Exam:    Vital Signs:  BP 112/68    Wt 179 lb (81.2 kg)    LMP  (LMP Unknown)    BMI 27.22 kg/m    General appearance: alert and no distress Lungs: Respiratory difficulty Abdomen: Overweight Extremities: extremities normal, atraumatic, no cyanosis or edema Skin: Skin color, texture, turgor normal. No rashes or lesions Neurologic: Grossly normal Psych: Pleasant  ASSESSMENT & PLAN:    Mixed dyslipidemia, goal LDL less than 70 Coronary artery disease with presumed prior MI Ischemic cardiomyopathy, LVEF recently 35 to 40% Statin intolerance-diarrhea Type 2 diabetes Tobacco abuse  This is a 50 year old female with multiple medical problems including an ischemic cardiomyopathy and coronary disease who has been unable to tolerate to high potency statins.  Based on this she needs a significant reduction in her LDL cholesterol of close to 50% to reach target LDL less than 70.  Would recommend Repatha 140 mg every 2 weeks.  Will reach out for prior  authorization and plan repeat lipid in about 3 months.  Thanks again for the kind referral.  COVID-19 Education: The signs and symptoms of COVID-19 were discussed with the patient and how to seek care for testing (follow up with PCP or arrange E-visit).  The importance of social distancing was discussed today.  Patient Risk:   After full review of this patients clinical status, I feel that they are at least moderate risk at this time.  Time:   Today, I have spent 25 minutes with the patient with telehealth technology discussing dyslipidemia.     Medication Adjustments/Labs and Tests Ordered: Current medicines are reviewed at length with the patient today.  Concerns regarding medicines are outlined above.   Tests Ordered: Orders Placed This Encounter  Procedures   Lipid panel    Medication Changes: No orders of the defined types were placed in this encounter.   Disposition:  in 3 month(s)  Chrystie Nose, MD, [facility], FACP  [place]   [facility]  Medical Director of the Advanced Lipid Disorders &  Cardiovascular Risk Reduction Clinic Diplomate of the American Board of Clinical Lipidology Attending Cardiologist  Direct Dial: [phone]   Fax: [phone]  Website:  [email or web address]  Chrystie Nose, MD  05/22/2021 8:40 PM

## 2021-05-22 NOTE — Telephone Encounter (Signed)
Left message for patient to review telehealth visit instructions  AVS and lab order(s) mailed  Patient called to review e-visit instructions. Patient aware that the following changes have been made: NONE until after labs -- possible PCSK9i Patient aware that they will need the following labs: fasting lipid panel Patient aware that they will need the following test(s): NONE  Recall for N/A entered.   3-4 month visit needs to be scheduled Can be virtual/in-office Can be added on to 9:30am spot on a 1/4 in-office day

## 2021-05-22 NOTE — Patient Instructions (Signed)
Medication Instructions:  NO CHANGES until after labs  Dr. Rennis Golden may advise an injectable cholesterol medication call a PCSK9 -- repatha or praluent  *If you need a refill on your cardiac medications before your next appointment, please call your pharmacy*   Lab Work: FASTING lipid panel to check cholesterol at [facility] location as soon as you are able  If you have labs (blood work) drawn today and your tests are completely normal, you will receive your results only by: MyChart Message (if you have MyChart) OR A paper copy in the mail If you have any lab test that is abnormal or we need to change your treatment, we will call you to review the results.   Testing/Procedures: NONE   Follow-Up: At [facility], you and your health needs are our priority.  As part of our continuing mission to provide you with exceptional heart care, we have created designated Provider Care Teams.  These Care Teams include your primary Cardiologist (physician) and Advanced Practice Providers (APPs -  Physician Assistants and Nurse Practitioners) who all work together to provide you with the care you need, when you need it.  We recommend signing up for the patient portal called "MyChart".  Sign up information is provided on this After Visit Summary.  MyChart is used to connect with patients for Virtual Visits (Telemedicine).  Patients are able to view lab/test results, encounter notes, upcoming appointments, etc.  Non-urgent messages can be sent to your provider as well.   To learn more about what you can do with MyChart, go to [email or web address].    Your next appointment:   4 month(s)  In Person or Video Visit with Dr. Rennis Golden

## 2021-07-04 ENCOUNTER — Encounter: Payer: Self-pay | Admitting: *Deleted

## 2021-07-08 ENCOUNTER — Ambulatory Visit: Payer: Self-pay | Admitting: Psychiatry

## 2021-07-08 ENCOUNTER — Encounter: Payer: Self-pay | Admitting: Psychiatry

## 2021-07-08 NOTE — Progress Notes (Deleted)
[facility]  PATIENT: Shelly [name] DOB: 1971-02-05  REFERRING CLINICIAN: Eber Jones, NP HISTORY FROM: self REASON FOR VISIT: dizziness, syncpoe   HISTORICAL  CHIEF COMPLAINT:  No chief complaint on file.   HISTORY OF PRESENT ILLNESS:  ***  OTHER MEDICAL CONDITIONS: ischemic cardiomyopathy, DM2, HLD, anxiety, depression, smoking  The patient presents for evaluation of dizziness and syncope***  Early satiety***postprandial hypotension***neuropathy***  She has a history of ischemic cardiomyopathy and follows with Cardiology. Zio patch without arrhythmias. Cardiology suspected episodes were orthostatic in nature. Recommended considering sleep study and evaluation for vertigo.  [facility] 02/20/21 was unremarkable  REVIEW OF SYSTEMS: Full 14 system review of systems performed and negative with exception of: ***  ALLERGIES: Allergies  Allergen Reactions   Atorvastatin     diarrhea   Rosuvastatin     diarrhea    HOME MEDICATIONS: Outpatient Medications Prior to Visit  Medication Sig Dispense Refill   aspirin 81 MG chewable tablet Chew 81 mg by mouth daily.     carvedilol (COREG) 3.125 MG tablet Take 1 tablet (3.125 mg total) by mouth 2 (two) times daily. 60 tablet 3   empagliflozin (JARDIANCE) 25 MG TABS tablet Take 1 tablet (25 mg total) by mouth daily. 90 tablet 3   furosemide (LASIX) 20 MG tablet Take 1 tablet (20 mg total) by mouth 3 (three) times a week. 30 tablet 3   ketorolac (ACULAR) 0.5 % ophthalmic solution Place 1 drop into both eyes 4 (four) times daily. 10 mL 2   KLOR-CON M10 10 MEQ tablet TAKE 2 TABLETS BY MOUTH DAILY 180 tablet 3   metFORMIN (GLUCOPHAGE) 1000 MG tablet Take 1,000 mg by mouth daily in the afternoon.     prednisoLONE acetate (PRED FORTE) 1 % ophthalmic suspension Place 1 drop into the left eye 4 (four) times daily. 15 mL 0   sacubitril-valsartan (ENTRESTO) 24-26 MG Take 1 tablet by mouth 2 (two) times daily. 60 tablet 3    spironolactone (ALDACTONE) 25 MG tablet TAKE 1 TABLET BY MOUTH EVERY DAY 90 tablet 3   traMADol (ULTRAM) 50 MG tablet Take 50 mg by mouth 3 (three) times daily as needed.     TRULICITY 3 MG/0.5ML SOPN Inject 3 mg into the skin every Sunday.     No facility-administered medications prior to visit.    PAST MEDICAL HISTORY: Past Medical History:  Diagnosis Date   Anemia    Anxiety    Arthritis    Cataract    Mixed OU   CHF (congestive heart failure) (HCC)    Coronary artery disease    Diabetes mellitus without complication (HCC)    type 2   Diabetic retinopathy (HCC)    PDR OU   Dizziness    Headache    Cluster headaches in the past   History of kidney stones    "2 sitting"   Hyperlipidemia    Hypertension    Hypertensive retinopathy    OU   Myocardial infarction (HCC)    Pneumonia    Syncope    Tobacco abuse     PAST SURGICAL HISTORY: Past Surgical History:  Procedure Laterality Date   EYE SURGERY Left 08/23/2020   PPV - Dr. [name]   INJECTION OF SILICONE OIL Left 08/23/2020   Procedure: INJECTION OF SILICONE OIL;  Surgeon: [name], MD;  Location: [facility] OR;  Service: Ophthalmology;  Laterality: Left;   INJECTION OF SILICONE OIL Right 10/04/2020   Procedure: INJECTION OF SILICONE OIL;  Surgeon: [name], MD;  Location: [facility] OR;  Service: Ophthalmology;  Laterality: Right;   MEMBRANE PEEL Left 08/23/2020   Procedure: MEMBRANE PEEL;  Surgeon: [name], MD;  Location: [facility] OR;  Service: Ophthalmology;  Laterality: Left;   MEMBRANE PEEL Right 10/04/2020   Procedure: MEMBRANE PEEL;  Surgeon: [name], MD;  Location: [facility] OR;  Service: Ophthalmology;  Laterality: Right;   PARS PLANA VITRECTOMY Left 08/23/2020   Procedure: PARS PLANA VITRECTOMY WITH 25 GAUGE WITH ENDOLASER;  Surgeon: [name], MD;  Location: [facility] OR;  Service: Ophthalmology;  Laterality: Left;   PARS PLANA VITRECTOMY Right 10/04/2020   Procedure: PARS PLANA VITRECTOMY WITH 25 GAUGE,  INJECTION OF AVASTIN;  Surgeon: [name], MD;  Location: [facility] OR;  Service: Ophthalmology;  Laterality: Right;   PHOTOCOAGULATION WITH LASER Right 10/04/2020   Procedure: PHOTOCOAGULATION WITH LASER;  Surgeon: [name], MD;  Location: [facility] OR;  Service: Ophthalmology;  Laterality: Right;   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/23/2020   Procedure: REPAIR OF COMPLEX TRACTION RETINAL DETACHMENT;  Surgeon: [name], MD;  Location: [facility] OR;  Service: Ophthalmology;  Laterality: Left;   RIGHT/LEFT HEART CATH AND CORONARY ANGIOGRAPHY N/A 10/14/2019   Procedure: RIGHT/LEFT HEART CATH AND CORONARY ANGIOGRAPHY;  Surgeon: Dolores Patty, MD;  Location: [facility] INVASIVE CV LAB;  Service: Cardiovascular;  Laterality: N/A;    FAMILY HISTORY: Family History  Problem Relation Age of Onset   Sudden Cardiac Death Mother    CAD Father    CAD Paternal Grandmother    CAD Paternal Grandfather     SOCIAL HISTORY: Social History   Socioeconomic History   Marital status: Married    Spouse name: Not on file   Number of children: Not on file   Years of education: Not on file   Highest education level: Not on file  Occupational History   Not on file  Tobacco Use   Smoking status: Every Day    Packs/day: 1.50    Types: Cigarettes   Smokeless tobacco: Never  Vaping Use   Vaping Use: Never used  Substance and Sexual Activity   Alcohol use: Not Currently   Drug use: Never   Sexual activity: Not on file  Other Topics Concern   Not on file  Social History Narrative   Not on file   Social Determinants of Health   Financial Resource Strain: Not on file  Food Insecurity: Not on file  Transportation Needs: Not on file  Physical Activity: Not on file  Stress: Not on file  Social Connections: Not on file  Intimate Partner Violence: Not on file     PHYSICAL EXAM ***  GENERAL EXAM/CONSTITUTIONAL: Vitals: There were no vitals filed for this visit. There is no height or weight  on file to calculate BMI. Wt Readings from Last 3 Encounters:  05/22/21 179 lb (81.2 kg)  04/23/21 184 lb (83.5 kg)  03/19/21 181 lb 3.2 oz (82.2 kg)   Patient is in no distress; well developed, nourished and groomed; neck is supple  CARDIOVASCULAR: Examination of carotid arteries is normal; no carotid bruits Regular rate and rhythm, no murmurs Examination of peripheral vascular system by observation and palpation is normal  EYES: Pupils round and reactive to light, Visual fields full to confrontation, Extraocular movements intacts,   MUSCULOSKELETAL: Gait, strength, tone, movements noted in Neurologic exam below  NEUROLOGIC: MENTAL STATUS:  No flowsheet data found. awake, alert, oriented to person, place and time recent and remote memory intact  normal attention and concentration language fluent, comprehension intact, naming intact fund of knowledge appropriate  CRANIAL NERVE:  2nd - no papilledema or hemorrhages on fundoscopic exam 2nd, 3rd, 4th, 6th - pupils equal and reactive to light, visual fields full to confrontation, extraocular muscles intact, no nystagmus 5th - facial sensation symmetric 7th - facial strength symmetric 8th - hearing intact 9th - palate elevates symmetrically, uvula midline 11th - shoulder shrug symmetric 12th - tongue protrusion midline  MOTOR:  normal bulk and tone, full strength in the BUE, BLE  SENSORY:  normal and symmetric to light touch, pinprick, temperature, vibration  COORDINATION:  finger-nose-finger, fine finger movements normal  REFLEXES:  deep tendon reflexes present and symmetric  GAIT/STATION:  normal     DIAGNOSTIC DATA (LABS, IMAGING, TESTING) - I reviewed patient records, labs, notes, testing and imaging myself where available.  Lab Results  Component Value Date   WBC 10.5 02/20/2021   HGB 16.0 (H) 02/20/2021   HCT 48.1 (H) 02/20/2021   MCV 85.9 02/20/2021   PLT 297 02/20/2021      Component Value  Date/Time   NA 135 04/23/2021 1514   K 3.6 04/23/2021 1514   CL 101 04/23/2021 1514   CO2 27 04/23/2021 1514   GLUCOSE 157 (H) 04/23/2021 1514   BUN 7 04/23/2021 1514   CREATININE 0.87 04/23/2021 1514   CALCIUM 8.8 (L) 04/23/2021 1514   PROT 6.9 02/20/2021 1759   ALBUMIN 4.4 02/20/2021 1759   AST 8 (L) 02/20/2021 1759   ALT 7 02/20/2021 1759   ALKPHOS 65 02/20/2021 1759   BILITOT 1.0 02/20/2021 1759   GFRNONAA >60 04/23/2021 1514   GFRAA >60 12/14/2019 0958   Lab Results  Component Value Date   CHOL 113 05/01/2020   HDL 32 (L) 05/01/2020   LDLCALC 44 05/01/2020   TRIG 185 (H) 05/01/2020   CHOLHDL 3.5 05/01/2020   No results found for: HGBA1C No results found for: VITAMINB12 No results found for: TSH  ***    ASSESSMENT AND PLAN  51 y.o. year old female with ***   No diagnosis found.    PLAN:   No orders of the defined types were placed in this encounter.   No orders of the defined types were placed in this encounter.   No follow-ups on file.    Ocie Doyne, MD  I spent an average of *** chart reviewing and counseling the patient, with at least 50% of the time face to face with the patient.   [facility] [address], [address] [place], [place] [address] [phone]

## 2021-07-21 NOTE — Progress Notes (Signed)
Advanced Heart Failure Clinic Note    PCP: Dema Severin, NP  Cardiologist: Dr. Eldridge Dace HF: Dr. Gala Romney   HPI: Shelly [name] is a 51 y.o. woman with DM2, tobacco abuse, CAD and systolic HF diagnosed in 5/21 with EF 20%.  Admitted 5/21 with ADHF. Echo EF 20% with severe RV dysfunction. R/LHC showed diffuse non-obstructive CAD with chronic dissection/recannalization of mid LAD. Suspect she had an out of hospital MI at some point.  There were no interventional targets.  Outpatient cMRI 8/21 1.  Mild LV dilatation with severe systolic dysfunction (LVEF 30%) 2. Subendocardial LGE c/w prior LAD and RCA infarcts. >50% transmural LGE suggesting nonviability in basal to mid inferior walls, apical anterior/septal walls, and apex. <50% transmurality of LGE suggesting viability in mid anterior wall. 3.  Normal RV size and systolic function (EF 66%)  Echo 01/15/21:  EF 35-40% RV ok. BP low. Entresto decreased, lasix cut back to MWF.  Evaluated in ED 02/20/21 for LOC. Felt to be orthostatic, CT head negative.  Zio-14 day (only able to wear for 4) showed mostly SR, no high-grade arrhythmias.  Today she returns for HF follow up. Says she has been doing ok. Yesterday stood up from the toilet and washed hands at sink. Turned to walk out of bathroom and passed out. Woke up on the floor. Went to Urgent Care. No ECG. Ended up with sprained ankle. Denies antecedent CP or palpitations. Says this has happened multiple times before. Last episode in December when she fainted in [place] after going to the bathroom. No edema, orthopnea or PND. Still smoking 1 ppd,   Cardiac Studies: Echo (8/22): EF 35-40% RV ok.  Echo 5/21: EF 20% with severe RV dysfunction.  RHC 5/21 Ao = 106/75 (87) LV = 107/27 RA = 15 RV = 54/16 PA = 57/25 (40) PCW = 27 (v = 37) Fick cardiac output/index = 4.4/2.0 PVR = 1.4 WU SVR = 1297 Ao sat = 99% PA sat = 62%, 63%  Past Medical History:  Diagnosis Date   Anemia    Anxiety     Arthritis    Cataract    Mixed OU   CHF (congestive heart failure) (HCC)    Coronary artery disease    Diabetes mellitus without complication (HCC)    type 2   Diabetic retinopathy (HCC)    PDR OU   Dizziness    Headache    Cluster headaches in the past   History of kidney stones    "2 sitting"   Hyperlipidemia    Hypertension    Hypertensive retinopathy    OU   Myocardial infarction (HCC)    Pneumonia    Syncope    Tobacco abuse    Current Outpatient Medications  Medication Sig Dispense Refill   aspirin 81 MG chewable tablet Chew 81 mg by mouth daily.     carvedilol (COREG) 3.125 MG tablet Take 1 tablet (3.125 mg total) by mouth 2 (two) times daily. 60 tablet 3   empagliflozin (JARDIANCE) 25 MG TABS tablet Take 1 tablet (25 mg total) by mouth daily. 90 tablet 3   furosemide (LASIX) 20 MG tablet Take 1 tablet (20 mg total) by mouth 3 (three) times a week. 30 tablet 3   ketorolac (ACULAR) 0.5 % ophthalmic solution Place 1 drop into both eyes 4 (four) times daily. 10 mL 2   KLOR-CON M10 10 MEQ tablet TAKE 2 TABLETS BY MOUTH DAILY 180 tablet 3   metFORMIN (GLUCOPHAGE) 1000 MG tablet  Take 1,000 mg by mouth daily in the afternoon.     prednisoLONE acetate (PRED FORTE) 1 % ophthalmic suspension Place 1 drop into the left eye 4 (four) times daily. 15 mL 0   sacubitril-valsartan (ENTRESTO) 24-26 MG Take 1 tablet by mouth 2 (two) times daily. 60 tablet 3   spironolactone (ALDACTONE) 25 MG tablet TAKE 1 TABLET BY MOUTH EVERY DAY 90 tablet 3   traMADol (ULTRAM) 50 MG tablet Take 50 mg by mouth 3 (three) times daily as needed.     TRULICITY 3 MG/0.5ML SOPN Inject 3 mg into the skin every Sunday.     No current facility-administered medications for this encounter.   Allergies  Allergen Reactions   Atorvastatin     diarrhea   Rosuvastatin     diarrhea    Social History   Socioeconomic History   Marital status: Married    Spouse name: Not on file   Number of children: Not on  file   Years of education: Not on file   Highest education level: Not on file  Occupational History   Not on file  Tobacco Use   Smoking status: Every Day    Packs/day: 1.50    Types: Cigarettes   Smokeless tobacco: Never  Vaping Use   Vaping Use: Never used  Substance and Sexual Activity   Alcohol use: Not Currently   Drug use: Never   Sexual activity: Not on file  Other Topics Concern   Not on file  Social History Narrative   Not on file   Social Determinants of Health   Financial Resource Strain: Not on file  Food Insecurity: Not on file  Transportation Needs: Not on file  Physical Activity: Not on file  Stress: Not on file  Social Connections: Not on file  Intimate Partner Violence: Not on file   Family History  Problem Relation Age of Onset   Sudden Cardiac Death Mother    CAD Father    CAD Paternal Grandmother    CAD Paternal Grandfather    LMP  (LMP Unknown)   Wt Readings from Last 3 Encounters:  05/22/21 81.2 kg (179 lb)  04/23/21 83.5 kg (184 lb)  03/19/21 82.2 kg (181 lb 3.2 oz)   Vitals:   07/22/21 1407  BP: 138/80  Pulse: 84  SpO2: 100%  Weight: 85.7 kg (189 lb)    PHYSICAL EXAM: General:  Well appearing. No resp difficulty HEENT: normal legally blind  Neck: supple. no JVD. Carotids 2+ bilat; no bruits. No lymphadenopathy or thryomegaly appreciated. Cor: PMI nondisplaced. Regular rate & rhythm. No rubs, gallops or murmurs. Lungs: decreased throughout Abdomen: soft, nontender, nondistended. No hepatosplenomegaly. No bruits or masses. Good bowel sounds. Extremities: no cyanosis, clubbing, rash, edema L ankle brace Neuro: alert & orientedx3, cranial nerves grossly intact. moves all 4 extremities w/o difficulty. Affect pleasant   ASSESSMENT & PLAN:  1. 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 R/LHC showed diffuse non-obstructive CAD with chronic dissection/recannalization of mid LAD. Suspect  she had an out of hospital MI at some point.  There were no interventional targets. - cMRI 8/21: LVEF 30% priorLAD & RCA infarcts. RV ok  - Echo 01/15/21:  EF 35-40% RV ok  - Stable NYHA II Volume status ok  - GDMT limited by low BP - will not titrate with recurrent syncope - Continue Entresto 24/26 mg bid.  - Continue spiro 25 mg daily. - Continue  Coreg 3.125 mg bid. - Continue Jardiance 25 mg daily.  - Continue lasix M/W/F + as needed. - Labs today   2. CAD: - LHC 5/21 showed diffuse non-obstructive CAD with chronic dissection/recannalization of mid LAD. Suspect she had an out of hospital MI at some point.  There were no interventional targets. - cMRI as above c/w prior infarcts in LAD/RCA territories - No s/s angina - Continue ASA, Jardiance & Trulicity - Off Crestor and atorva with GI upset. - Followed by lipid clinic - pending reaptha start - Needs complete smoking cessation.  3. Syncope, recurrent - Suspect autonomic dysfunction in setting of advanced DM2. Asked her to wear her compression hose - Zio 4 day (10/22): showed mostly SR, no high grade arrhythmia - Will refer for ILR to exclude high-grade arrhythmias  4. Type 2 DM with diabetic retinopathy:  - Management per PCP. - Continue Jardiance & Trulicity - HgBA1c down to 6.5%.  5. Tobacco Abuse: - Discussed smoking cessation.  - She is not ready to quit  6. HLD:  - Failed statins - Followed by lipid clinic - pending reaptha start   Arvilla Meres, MD 07/21/21

## 2021-07-22 ENCOUNTER — Ambulatory Visit (HOSPITAL_COMMUNITY)
Admission: RE | Admit: 2021-07-22 | Discharge: 2021-07-22 | Disposition: A | Discharge status: Home / Self Care | Payer: Managed Care, Other (non HMO) | Source: Ambulatory Visit | Attending: Internal Medicine | Admitting: Internal Medicine

## 2021-07-22 ENCOUNTER — Encounter (HOSPITAL_COMMUNITY): Payer: Self-pay | Admitting: Internal Medicine

## 2021-07-22 ENCOUNTER — Other Ambulatory Visit: Payer: Self-pay

## 2021-07-22 VITALS — BP 138/80 | HR 84 | Wt 189.0 lb

## 2021-07-22 DIAGNOSIS — I509 Heart failure, unspecified: Secondary | ICD-10-CM

## 2021-07-22 DIAGNOSIS — Z7982 Long term (current) use of aspirin: Secondary | ICD-10-CM | POA: Diagnosis not present

## 2021-07-22 DIAGNOSIS — I252 Old myocardial infarction: Secondary | ICD-10-CM | POA: Diagnosis not present

## 2021-07-22 DIAGNOSIS — R55 Syncope and collapse: Secondary | ICD-10-CM | POA: Diagnosis not present

## 2021-07-22 DIAGNOSIS — Z79899 Other long term (current) drug therapy: Secondary | ICD-10-CM | POA: Diagnosis not present

## 2021-07-22 DIAGNOSIS — W1830XA Fall on same level, unspecified, initial encounter: Secondary | ICD-10-CM | POA: Insufficient documentation

## 2021-07-22 DIAGNOSIS — Z9181 History of falling: Secondary | ICD-10-CM | POA: Insufficient documentation

## 2021-07-22 DIAGNOSIS — Z794 Long term (current) use of insulin: Secondary | ICD-10-CM

## 2021-07-22 DIAGNOSIS — Z720 Tobacco use: Secondary | ICD-10-CM

## 2021-07-22 DIAGNOSIS — F17210 Nicotine dependence, cigarettes, uncomplicated: Secondary | ICD-10-CM | POA: Diagnosis not present

## 2021-07-22 DIAGNOSIS — E1136 Type 2 diabetes mellitus with diabetic cataract: Secondary | ICD-10-CM | POA: Diagnosis not present

## 2021-07-22 DIAGNOSIS — I255 Ischemic cardiomyopathy: Secondary | ICD-10-CM | POA: Diagnosis not present

## 2021-07-22 DIAGNOSIS — Z7985 Long-term (current) use of injectable non-insulin antidiabetic drugs: Secondary | ICD-10-CM | POA: Diagnosis not present

## 2021-07-22 DIAGNOSIS — E11319 Type 2 diabetes mellitus with unspecified diabetic retinopathy without macular edema: Secondary | ICD-10-CM | POA: Insufficient documentation

## 2021-07-22 DIAGNOSIS — I959 Hypotension, unspecified: Secondary | ICD-10-CM | POA: Insufficient documentation

## 2021-07-22 DIAGNOSIS — H548 Legal blindness, as defined in USA: Secondary | ICD-10-CM | POA: Insufficient documentation

## 2021-07-22 DIAGNOSIS — I2510 Atherosclerotic heart disease of native coronary artery without angina pectoris: Secondary | ICD-10-CM | POA: Diagnosis not present

## 2021-07-22 DIAGNOSIS — Z7984 Long term (current) use of oral hypoglycemic drugs: Secondary | ICD-10-CM | POA: Diagnosis not present

## 2021-07-22 DIAGNOSIS — E1139 Type 2 diabetes mellitus with other diabetic ophthalmic complication: Secondary | ICD-10-CM

## 2021-07-22 DIAGNOSIS — E785 Hyperlipidemia, unspecified: Secondary | ICD-10-CM | POA: Diagnosis not present

## 2021-07-22 DIAGNOSIS — I110 Hypertensive heart disease with heart failure: Secondary | ICD-10-CM | POA: Insufficient documentation

## 2021-07-22 DIAGNOSIS — I5022 Chronic systolic (congestive) heart failure: Secondary | ICD-10-CM | POA: Diagnosis not present

## 2021-07-22 DIAGNOSIS — K30 Functional dyspepsia: Secondary | ICD-10-CM | POA: Insufficient documentation

## 2021-07-22 DIAGNOSIS — S93409A Sprain of unspecified ligament of unspecified ankle, initial encounter: Secondary | ICD-10-CM | POA: Insufficient documentation

## 2021-07-22 LAB — COMPREHENSIVE METABOLIC PANEL
ALT: 8 U/L (ref 0–44)
AST: 10 U/L — ABNORMAL LOW (ref 15–41)
Albumin: 4 g/dL (ref 3.5–5.0)
Alkaline Phosphatase: 71 U/L (ref 38–126)
Anion gap: 8 (ref 5–15)
BUN: 14 mg/dL (ref 6–20)
CO2: 28 mmol/L (ref 22–32)
Calcium: 9.6 mg/dL (ref 8.9–10.3)
Chloride: 100 mmol/L (ref 98–111)
Creatinine, Ser: 1 mg/dL (ref 0.44–1.00)
GFR, Estimated: >60 mL/min (ref >60)
Glucose, Bld: 114 mg/dL — ABNORMAL HIGH (ref 70–99)
Potassium: 4 mmol/L (ref 3.5–5.1)
Sodium: 136 mmol/L (ref 135–145)
Total Bilirubin: 1 mg/dL (ref 0.3–1.2)
Total Protein: 6.7 g/dL (ref 6.5–8.1)

## 2021-07-22 LAB — CBC
HCT: 48.4 % — ABNORMAL HIGH (ref 36.0–46.0)
Hemoglobin: 15.8 g/dL — ABNORMAL HIGH (ref 12.0–15.0)
MCH: 28.1 pg (ref 26.0–34.0)
MCHC: 32.6 g/dL (ref 30.0–36.0)
MCV: 86 fL (ref 80.0–100.0)
Platelets: 327 10*3/uL (ref 150–400)
RBC: 5.63 MIL/uL — ABNORMAL HIGH (ref 3.87–5.11)
RDW: 12.3 % (ref 11.5–15.5)
WBC: 11.4 10*3/uL — ABNORMAL HIGH (ref 4.0–10.5)
nRBC: 0 % (ref 0.0–0.2)

## 2021-07-22 NOTE — Addendum Note (Signed)
Encounter addended by: Kyndal Hedges, RN on: 07/22/2021 2:46 PM  Actions taken: Order list changed, Diagnosis association updated

## 2021-07-22 NOTE — Patient Instructions (Signed)
Thank you for your visit today.  Labs done today, your results will be available in MyChart, we will contact you for abnormal readings.  You have been referred to Electrophysiologist for an Internal Loop Recorder. They will call you to schedule your appointment with you.  Your provider has requested that you get a Carotid Ultrasound done.ONCE APPROVED BY YOUR INSURANCE COMPANY, YOU WILL BE CALLED TO ARRANGE THE TEST.  Your physician recommends that you schedule a follow-up appointment in: 6 MONTHS (August 2023) ** please call the office in June to schedule your follow up appointment**  If you have any questions or concerns before your next appointment please send [place] a message through [place] or call our office at [phone].    TO LEAVE A MESSAGE FOR THE NURSE SELECT OPTION 2, PLEASE LEAVE A MESSAGE INCLUDING: YOUR NAME DATE OF BIRTH CALL BACK NUMBER REASON FOR CALL**this is important as we prioritize the call backs  YOU WILL RECEIVE A CALL BACK THE SAME DAY AS LONG AS YOU CALL BEFORE 4:00 PM  At the [facility], you and your health needs are our priority. As part of our continuing mission to provide you with exceptional heart care, we have created designated Provider Care Teams. These Care Teams include your primary Cardiologist (physician) and Advanced Practice Providers (APPs- Physician Assistants and Nurse Practitioners) who all work together to provide you with the care you need, when you need it.   You may see any of the following providers on your designated Care Team at your next follow up: Dr Arvilla Meres Dr Carron Curie, NP Robbie Lis, [place] [facility] [place], [place] Karle Plumber, PharmD   Please be sure to bring in all your medications bottles to every appointment.

## 2021-07-22 NOTE — Addendum Note (Signed)
Encounter addended by: Emalia Hedges, RN on: 07/22/2021 2:40 PM  Actions taken: Visit diagnoses modified, Order list changed, Diagnosis association updated, Charge Capture section accepted, Clinical Note Signed

## 2021-07-26 ENCOUNTER — Other Ambulatory Visit (HOSPITAL_COMMUNITY): Payer: Self-pay | Admitting: Internal Medicine

## 2021-07-26 ENCOUNTER — Other Ambulatory Visit (HOSPITAL_COMMUNITY): Payer: Self-pay

## 2021-07-29 ENCOUNTER — Other Ambulatory Visit (HOSPITAL_COMMUNITY): Payer: Self-pay

## 2021-07-29 ENCOUNTER — Telehealth (HOSPITAL_COMMUNITY): Payer: Self-pay | Admitting: Pharmacy Technician

## 2021-07-29 MED ORDER — DAPAGLIFLOZIN PROPANEDIOL 10 MG PO TABS: ORAL_TABLET | ORAL | 11 refills | Status: DC

## 2021-07-29 NOTE — Telephone Encounter (Signed)
Patient called to make [place] aware that her pcp changed her from jardiance to [place]. Med list updated to reflect changes.

## 2021-07-29 NOTE — Telephone Encounter (Signed)
Advanced Heart Failure Patient Advocate Encounter  Patient called in stating that her insurance is not paying for [place] and the pharmacy advised her they would pay for Jardiance. Ran a test claim and insurance said it was too soon for a refill. Did not say that there was a PA required or that the medication was not available for the patient.  Called and left the patient a message, hopefully can get more information when she calls back.  Archer Asa, CPhT

## 2021-07-29 NOTE — Telephone Encounter (Signed)
Patient called back. She wanted to inform us that she was being changed by suggestion of her pcp. Looks like this has already been notated in her chart. No further action needed at this time.   Archer Asa, CPhT

## 2021-07-31 ENCOUNTER — Other Ambulatory Visit (HOSPITAL_COMMUNITY): Payer: Self-pay | Admitting: *Deleted

## 2021-08-01 ENCOUNTER — Ambulatory Visit (HOSPITAL_COMMUNITY)
Admission: RE | Admit: 2021-08-01 | Discharge: 2021-08-01 | Disposition: A | Discharge status: Home / Self Care | Payer: Managed Care, Other (non HMO) | Source: Ambulatory Visit | Attending: Internal Medicine | Admitting: Internal Medicine

## 2021-08-01 ENCOUNTER — Other Ambulatory Visit (HOSPITAL_COMMUNITY): Payer: Self-pay | Admitting: Internal Medicine

## 2021-08-01 ENCOUNTER — Other Ambulatory Visit: Payer: Self-pay

## 2021-08-01 DIAGNOSIS — R55 Syncope and collapse: Secondary | ICD-10-CM | POA: Insufficient documentation

## 2021-08-02 ENCOUNTER — Other Ambulatory Visit (HOSPITAL_COMMUNITY): Payer: Self-pay | Admitting: Internal Medicine

## 2021-08-02 ENCOUNTER — Other Ambulatory Visit (HOSPITAL_COMMUNITY): Payer: Self-pay

## 2021-08-05 ENCOUNTER — Other Ambulatory Visit (HOSPITAL_COMMUNITY): Payer: Self-pay

## 2021-08-05 ENCOUNTER — Other Ambulatory Visit (HOSPITAL_COMMUNITY): Payer: Self-pay | Admitting: Internal Medicine

## 2021-08-05 MED ORDER — SPIRONOLACTONE 25 MG PO TABS
25.0000 mg | ORAL_TABLET | Freq: Every day | ORAL | 3 refills | Status: DC
  Filled 2021-08-05 – 2021-09-16 (×2): qty 90, 90d supply, fill #0
  Filled 2021-12-05: qty 90, 90d supply, fill #1
  Filled 2022-03-05: qty 90, 90d supply, fill #2
  Filled 2022-06-08: qty 90, 90d supply, fill #3

## 2021-08-05 MED ORDER — DAPAGLIFLOZIN PROPANEDIOL 10 MG PO TABS
ORAL_TABLET | ORAL | 11 refills | Status: DC
Start: 2021-08-05 — End: 2021-08-12

## 2021-08-05 MED ORDER — DAPAGLIFLOZIN PROPANEDIOL 10 MG PO TABS
ORAL_TABLET | ORAL | 11 refills | Status: DC
  Filled 2021-08-05: qty 30, fill #0

## 2021-08-05 MED ORDER — ENTRESTO 24-26 MG PO TABS
1.0000 | ORAL_TABLET | Freq: Two times a day (BID) | ORAL | 3 refills | Status: DC
  Filled 2021-08-05: qty 60, 30d supply, fill #0

## 2021-08-05 MED ORDER — CARVEDILOL 3.125 MG PO TABS
3.1250 mg | ORAL_TABLET | Freq: Two times a day (BID) | ORAL | 2 refills | Status: DC
  Filled 2021-08-05 – 2021-09-16 (×2): qty 180, 90d supply, fill #0
  Filled 2021-12-05: qty 180, 90d supply, fill #1
  Filled 2022-03-05: qty 180, 90d supply, fill #2

## 2021-08-05 MED ORDER — DAPAGLIFLOZIN PROPANEDIOL 10 MG PO TABS: ORAL_TABLET | ORAL | 11 refills | Status: DC

## 2021-08-11 ENCOUNTER — Emergency Department (HOSPITAL_COMMUNITY)
Admission: EM | Admit: 2021-08-11 | Discharge: 2021-08-12 | Discharge status: Against Medical Advice | Payer: Commercial Managed Care - HMO | Attending: Emergency Medicine | Admitting: Emergency Medicine

## 2021-08-11 ENCOUNTER — Emergency Department (HOSPITAL_COMMUNITY): Payer: Commercial Managed Care - HMO

## 2021-08-11 ENCOUNTER — Other Ambulatory Visit: Payer: Self-pay

## 2021-08-11 DIAGNOSIS — Z20822 Contact with and (suspected) exposure to covid-19: Secondary | ICD-10-CM | POA: Insufficient documentation

## 2021-08-11 DIAGNOSIS — E871 Hypo-osmolality and hyponatremia: Secondary | ICD-10-CM | POA: Insufficient documentation

## 2021-08-11 DIAGNOSIS — Z79899 Other long term (current) drug therapy: Secondary | ICD-10-CM | POA: Insufficient documentation

## 2021-08-11 DIAGNOSIS — N179 Acute kidney failure, unspecified: Secondary | ICD-10-CM | POA: Insufficient documentation

## 2021-08-11 DIAGNOSIS — I951 Orthostatic hypotension: Secondary | ICD-10-CM

## 2021-08-11 DIAGNOSIS — R55 Syncope and collapse: Secondary | ICD-10-CM | POA: Diagnosis not present

## 2021-08-11 DIAGNOSIS — Z7984 Long term (current) use of oral hypoglycemic drugs: Secondary | ICD-10-CM | POA: Diagnosis not present

## 2021-08-11 DIAGNOSIS — D72829 Elevated white blood cell count, unspecified: Secondary | ICD-10-CM | POA: Diagnosis not present

## 2021-08-11 DIAGNOSIS — E11319 Type 2 diabetes mellitus with unspecified diabetic retinopathy without macular edema: Secondary | ICD-10-CM | POA: Diagnosis not present

## 2021-08-11 DIAGNOSIS — Z7982 Long term (current) use of aspirin: Secondary | ICD-10-CM | POA: Diagnosis not present

## 2021-08-11 DIAGNOSIS — Z720 Tobacco use: Secondary | ICD-10-CM | POA: Diagnosis not present

## 2021-08-11 DIAGNOSIS — I509 Heart failure, unspecified: Secondary | ICD-10-CM | POA: Insufficient documentation

## 2021-08-11 DIAGNOSIS — E860 Dehydration: Secondary | ICD-10-CM

## 2021-08-11 LAB — CBC WITH DIFFERENTIAL/PLATELET
Abs Immature Granulocytes: 0.06 10*3/uL (ref 0.00–0.07)
Basophils Absolute: 0.1 10*3/uL (ref 0.0–0.1)
Basophils Relative: 1 %
Eosinophils Absolute: 0.8 10*3/uL — ABNORMAL HIGH (ref 0.0–0.5)
Eosinophils Relative: 5 %
HCT: 45.1 % (ref 36.0–46.0)
Hemoglobin: 15 g/dL (ref 12.0–15.0)
Immature Granulocytes: 0 %
Lymphocytes Relative: 23 %
Lymphs Abs: 3.4 10*3/uL (ref 0.7–4.0)
MCH: 28.5 pg (ref 26.0–34.0)
MCHC: 33.3 g/dL (ref 30.0–36.0)
MCV: 85.6 fL (ref 80.0–100.0)
Monocytes Absolute: 0.9 10*3/uL (ref 0.1–1.0)
Monocytes Relative: 6 %
Neutro Abs: 9.4 10*3/uL — ABNORMAL HIGH (ref 1.7–7.7)
Neutrophils Relative %: 65 %
Platelets: 290 10*3/uL (ref 150–400)
RBC: 5.27 MIL/uL — ABNORMAL HIGH (ref 3.87–5.11)
RDW: 12.4 % (ref 11.5–15.5)
WBC: 14.6 10*3/uL — ABNORMAL HIGH (ref 4.0–10.5)
nRBC: 0 % (ref 0.0–0.2)

## 2021-08-11 LAB — BASIC METABOLIC PANEL
Anion gap: 11 (ref 5–15)
BUN: 21 mg/dL — ABNORMAL HIGH (ref 6–20)
CO2: 23 mmol/L (ref 22–32)
Calcium: 8.8 mg/dL — ABNORMAL LOW (ref 8.9–10.3)
Chloride: 100 mmol/L (ref 98–111)
Creatinine, Ser: 1.2 mg/dL — ABNORMAL HIGH (ref 0.44–1.00)
GFR, Estimated: 55 mL/min — ABNORMAL LOW (ref >60)
Glucose, Bld: 161 mg/dL — ABNORMAL HIGH (ref 70–99)
Potassium: 4.3 mmol/L (ref 3.5–5.1)
Sodium: 134 mmol/L — ABNORMAL LOW (ref 135–145)

## 2021-08-11 LAB — CBG MONITORING, ED: Glucose-Capillary: 139 mg/dL — ABNORMAL HIGH (ref 70–99)

## 2021-08-11 LAB — TSH: TSH: 1.016 u[IU]/mL (ref 0.350–4.500)

## 2021-08-11 LAB — TROPONIN I (HIGH SENSITIVITY): Troponin I (High Sensitivity): 6 ng/L (ref <18)

## 2021-08-11 LAB — MAGNESIUM: Magnesium: 2.3 mg/dL (ref 1.7–2.4)

## 2021-08-11 MED ORDER — SODIUM CHLORIDE 0.9 % IV BOLUS
1000.0000 mL | Freq: Once | INTRAVENOUS | Status: AC
Start: 2021-08-11 — End: 2021-08-11
  Administered 2021-08-11: 1000 mL via INTRAVENOUS

## 2021-08-11 NOTE — ED Triage Notes (Addendum)
Pt arrived via [facility] from vehicle for cc of syncope and palpitations. Pt was a passenger in vehicle headed to [place] because she felt her "heart wasn't beating right" and generally unwell, pt has been dealing with similar symptoms for the last year. Per pt family, pt lost consciousness and was unresponsive for a few seconds, then awakened and was confused, no seizure like activity was noted. Pt is currently scheduled for cardiac monitor placement for evaluation of similar cardiac symptoms and syncopal events that have occurred over the last year. Mild confusion noted upon EMS arrival, pt GCS 15, A&Ox4 at time of arrival to [facility]. ? ?18g placed in LAC ? ?BP 126/60 ?98% RA ?HR 90 ?RR 18 ?CBG 150 ?

## 2021-08-11 NOTE — ED Provider Notes (Cosign Needed)
?[place] ?Provider Note ? ? ?CSN: [id number] ?Arrival date & time: 08/11/21  2115 ? ?  ? ?History ? ?Chief Complaint  ?Patient presents with  ? Palpitations  ? Loss of Consciousness  ? ? ?Shelly [name] is a 51 y.o. female with history of mixed nonischemic and ischemic  ? ? chronic heart failure with ejection fraction of 25 to 30% in August 2022 who presents today with concern for syncope.  Patient has had ongoing recurrent syncopal episodes primarily occurring with change of position.  This evening she said she felt an abnormal sensation in her chest.  She denies any palpitations, shortness of breath, or chest pain but states that it "feels like there is no enough room in my chest".  She states that her child was driving her to [facility] when she spontaneously syncopized in the car.  No witnessed seizure-like episodes.  She states that she has been following closely with Dr. Jeffie Pollock, her cardiologist and is scheduled for loop recorder implantation in April 2023. ? ?Patient has history of Zio patch that revealed normal sinus rhythm without concerning dysrhythmia. ? ?In addition to the above listed concerns patient has history of type 2 diabetes and chronic tobacco use.  Patient also has history of blindness bilaterally secondary to diabetic retinopathy and retinal detachment in the past.  Patient is on Farxiga, carvedilol, Lasix, spironolactone, and is poor at drinking water or electrolyte drinks.  Primarily drinks tea or Sprite.  She is not anticoagulated. ? ?HPI ? ?  ? ?Home Medications ?Prior to Admission medications   ?Medication Sig Start Date End Date Taking? Authorizing Provider  ?aspirin 81 MG chewable tablet Chew 81 mg by mouth daily.    [provider]  ?carvedilol (COREG) 3.125 MG tablet Take 1 tablet (3.125 mg total) by mouth 2 (two) times daily. 08/05/21   Bensimhon, Shaune Pascal, MD  ?dapagliflozin propanediol (FARXIGA) 10 MG TABS tablet Take 1 & 1/2  tablets by mouth daily. 08/05/21   Bensimhon, Shaune Pascal, MD  ?furosemide (LASIX) 20 MG tablet Take 1 tablet (20 mg total) by mouth 3 (three) times a week. 01/16/21   Bensimhon, Shaune Pascal, MD  ?ketorolac (ACULAR) 0.5 % ophthalmic solution Place 1 drop into both eyes 4 (four) times daily. 11/07/20   Bernarda Caffey, MD  ?KLOR-CON M10 10 MEQ tablet TAKE 2 TABLETS BY MOUTH DAILY 04/18/21   Bensimhon, Shaune Pascal, MD  ?metFORMIN (GLUCOPHAGE) 1000 MG tablet Take 1,000 mg by mouth daily in the afternoon.    [provider]  ?prednisoLONE acetate (PRED FORTE) 1 % ophthalmic suspension Place 1 drop into the left eye 4 (four) times daily. 08/30/20   Bernarda Caffey, MD  ?sacubitril-valsartan (ENTRESTO) 24-26 MG Take 1 tablet by mouth 2 (two) times daily. 08/05/21   Bensimhon, Shaune Pascal, MD  ?spironolactone (ALDACTONE) 25 MG tablet Take 1 tablet (25 mg total) by mouth daily. 08/05/21   Bensimhon, Shaune Pascal, MD  ?TRULICITY 3 0000000 SOPN Inject 3 mg into the skin every Sunday. 08/29/20   [provider]  ?   ? ?Allergies    ?Atorvastatin and Rosuvastatin   ? ?Review of Systems   ?Review of Systems  ?Neurological:  Positive for syncope.  ?All other systems reviewed and are negative. ? ?Physical Exam ?Updated Vital Signs ?BP 96/64 (BP Location: Left Arm)   Pulse 85   Temp 98.1 ?F (36.7 ?C) (Oral)   Resp 20   Ht 5\' 8"  (1.727 m)  Wt 80.7 kg   LMP  (LMP Unknown)   SpO2 97%   BMI 27.06 kg/m?  ?Physical Exam ?Vitals and nursing note reviewed.  ?Constitutional:   ?   Appearance: She is not ill-appearing or toxic-appearing.  ?HENT:  ?   Head: Normocephalic and atraumatic.  ?   Nose: Nose normal.  ?   Mouth/Throat:  ?   Mouth: Mucous membranes are moist.  ?   Pharynx: Oropharynx is clear. Uvula midline. No oropharyngeal exudate or posterior oropharyngeal erythema.  ?   Tonsils: No tonsillar exudate.  ?Eyes:  ?   General: Lids are normal.     ?   Right eye: No discharge.     ?   Left eye: No discharge.  ?   Conjunctiva/sclera:  Conjunctivae normal.  ?   Pupils: Pupils are equal, round, and reactive to light.  ?Cardiovascular:  ?   Rate and Rhythm: Normal rate and regular rhythm.  ?   Pulses: Normal pulses.  ?   Heart sounds: Normal heart sounds. No murmur heard. ?Pulmonary:  ?   Effort: Pulmonary effort is normal. No tachypnea, bradypnea, accessory muscle usage, prolonged expiration or respiratory distress.  ?   Breath sounds: Normal breath sounds. No wheezing or rales.  ?Chest:  ?   Chest wall: No mass, lacerations, deformity, swelling, tenderness, crepitus or edema.  ?Abdominal:  ?   General: Bowel sounds are normal. There is no distension.  ?   Palpations: Abdomen is soft.  ?   Tenderness: There is no abdominal tenderness. There is no right CVA tenderness, left CVA tenderness, guarding or rebound.  ?Musculoskeletal:     ?   General: No deformity.  ?   Cervical back: Neck supple.  ?   Right lower leg: No edema.  ?   Left lower leg: No edema.  ?Skin: ?   General: Skin is warm and dry.  ?   Capillary Refill: Capillary refill takes less than 2 seconds.  ?Neurological:  ?   General: No focal deficit present.  ?   Mental Status: She is alert and oriented to person, place, and time. Mental status is at baseline.  ?   GCS: GCS eye subscore is 4. GCS verbal subscore is 5. GCS motor subscore is 6.  ?   Sensory: Sensation is intact.  ?   Motor: Motor function is intact.  ?   Gait: Gait is intact.  ?Psychiatric:     ?   Mood and Affect: Mood normal.  ? ? ?ED Results / Procedures / Treatments   ?Labs ?(all labs ordered are listed, but only abnormal results are displayed) ?Labs Reviewed - No data to display ? ?EKG ?None ? ?Radiology ?No results found. ? ?Procedures ?Procedures  ? ? ?Medications Ordered in ED ?Medications - No data to display ? ?ED Course/ Medical Decision Making/ A&P ?Clinical Course as of 08/12/21 0036  ?Sun Aug 11, 2021  ?2339 CHF EF 20%, recurrent syncope x1 year,  [MK]  ?  ?Clinical Course User Index ?[MK] Kommor, Debe Coder, MD   ? ?                        ?Medical Decision Making ?26 -year-old female with history of CHF and recurrent syncope who presents after syncopal episode this evening. ? ?Pertinent low BP on arrival with systolic pressure in the 0000000.  Vital signs otherwise normal.  Cardiopulmonary exam is normal, abdominal exam is benign.  Patient  is neurovascular intact in all 4 extremities without focal deficit on neurologic exam. ? ? ?Amount and/or Complexity of Data Reviewed ?Labs: ordered. ?   Details: CBC with mild leukocytosis of 14,000 without anemia.  BMP with hyponatremia of 134, mild AKI with creatinine of 1.2, 0.8 at patient's baseline.  Fluid bolus ordered for AKI despite ejection fraction less than 30%.  Troponin negative, 6.  Magnesium is normal, TSH is normal. ?Radiology: ordered. ?ECG/medicine tests: ordered. ?   Details: EKG with normal sinus rhythm without STEMI or dysrhythmia. ? ?Risk ?Prescription drug management. ? ? ?Clinical picture not consistent with cardiogenic syncope; most consistent with orthostasis with syncope.  Significant orthostatic on evaluation orthostatic vital signs.  Case discussed with attending physician who also evaluated patient at the bedside.  Recommends proceeding with CTA for rule out of PE. ? ?At time of shift change care of this patient signed out to oncoming ED provider Dr. Matilde Sprang.  All pertinent HPI, physical exam, laboratory findings were discussed with him prior to my departure.  [place] voiced understanding with medical evaluation and treatment plan thus far.  Each of her questions answered to her expressed satisfaction. ? ?This chart was dictated using voice recognition software, Dragon. Despite the best efforts of this provider to proofread and correct errors, errors may still occur which can change documentation meaning. ? ?Final Clinical Impression(s) / ED Diagnoses ?Final diagnoses:  ?None  ? ? ?Rx / DC Orders ?ED Discharge Orders   ? ? None  ? ?  ? ? ?  ?Emeline Darling,  PA-C ?08/12/21 0051 ? ?

## 2021-08-12 ENCOUNTER — Other Ambulatory Visit (HOSPITAL_COMMUNITY): Payer: Self-pay

## 2021-08-12 ENCOUNTER — Ambulatory Visit (HOSPITAL_BASED_OUTPATIENT_CLINIC_OR_DEPARTMENT_OTHER)
Admission: RE | Admit: 2021-08-12 | Discharge: 2021-08-12 | Disposition: A | Discharge status: Home / Self Care | Payer: Commercial Managed Care - HMO | Source: Ambulatory Visit | Attending: Family Medicine | Admitting: Family Medicine

## 2021-08-12 ENCOUNTER — Ambulatory Visit (HOSPITAL_COMMUNITY)
Admission: RE | Admit: 2021-08-12 | Discharge: 2021-08-12 | Disposition: A | Discharge status: Home / Self Care | Payer: Managed Care, Other (non HMO) | Source: Ambulatory Visit | Attending: Cardiology | Admitting: Cardiology

## 2021-08-12 ENCOUNTER — Encounter (HOSPITAL_COMMUNITY): Payer: Self-pay

## 2021-08-12 ENCOUNTER — Other Ambulatory Visit (HOSPITAL_COMMUNITY): Payer: Self-pay | Admitting: Cardiology

## 2021-08-12 ENCOUNTER — Other Ambulatory Visit (HOSPITAL_COMMUNITY): Payer: Self-pay | Admitting: *Deleted

## 2021-08-12 ENCOUNTER — Telehealth: Payer: Self-pay | Admitting: Physician Assistant

## 2021-08-12 ENCOUNTER — Emergency Department (HOSPITAL_COMMUNITY): Payer: Commercial Managed Care - HMO

## 2021-08-12 VITALS — BP 88/60 | HR 84 | Wt 189.2 lb

## 2021-08-12 DIAGNOSIS — Z720 Tobacco use: Secondary | ICD-10-CM

## 2021-08-12 DIAGNOSIS — R55 Syncope and collapse: Secondary | ICD-10-CM

## 2021-08-12 DIAGNOSIS — I5022 Chronic systolic (congestive) heart failure: Secondary | ICD-10-CM | POA: Diagnosis not present

## 2021-08-12 DIAGNOSIS — I2510 Atherosclerotic heart disease of native coronary artery without angina pectoris: Secondary | ICD-10-CM

## 2021-08-12 DIAGNOSIS — E1139 Type 2 diabetes mellitus with other diabetic ophthalmic complication: Secondary | ICD-10-CM | POA: Diagnosis not present

## 2021-08-12 DIAGNOSIS — Z794 Long term (current) use of insulin: Secondary | ICD-10-CM

## 2021-08-12 DIAGNOSIS — E785 Hyperlipidemia, unspecified: Secondary | ICD-10-CM

## 2021-08-12 LAB — RESP PANEL BY RT-PCR (FLU A&B, COVID) ARPGX2
Influenza A by PCR: NEGATIVE
Influenza B by PCR: NEGATIVE
SARS Coronavirus 2 by RT PCR: NEGATIVE

## 2021-08-12 LAB — TROPONIN I (HIGH SENSITIVITY): Troponin I (High Sensitivity): 7 ng/L (ref <18)

## 2021-08-12 MED ORDER — DAPAGLIFLOZIN PROPANEDIOL 10 MG PO TABS: 10.0000 mg | ORAL_TABLET | Freq: Every day | ORAL | 11 refills | Status: DC

## 2021-08-12 MED ORDER — LOSARTAN POTASSIUM 25 MG PO TABS
12.5000 mg | ORAL_TABLET | Freq: Every evening | ORAL | 0 refills | Status: DC
  Filled 2021-08-12: qty 30, 60d supply, fill #0

## 2021-08-12 MED ORDER — IOHEXOL 350 MG/ML SOLN
50.0000 mL | Freq: Once | INTRAVENOUS | Status: AC | PRN
  Administered 2021-08-12: 50 mL via INTRAVENOUS

## 2021-08-12 NOTE — Telephone Encounter (Signed)
Pt contacted appt scheduled.  ?

## 2021-08-12 NOTE — Telephone Encounter (Signed)
Pt was on her way to present to [facility] due to chest discomfort and "my heart doesn't feel right." On the way, she syncopized in the car and EMS was dispatched and took her to [facility]. In the ER, she had orthostatic hypotension and gentle fluids were recommended. Pt left AMA and is now calling to see what her medication changes should.  ? ?She took entresto and coreg. She has not taken jardiance or spironolactone (takes in evening). She has not taken her lasix yet.  ? ?She does not have an updated BP after these medications. I asked her to hold off on lasix for now, but its difficult to titrate medications without updated BP and understanding of her current fluid balance.  ? ?I let her know I will send a message to triage to see if she can be seen in clinic today. ?

## 2021-08-12 NOTE — Patient Instructions (Addendum)
Thank you for coming in today ? ?No labs today ? ?START Losartan 12.5 mg 1/2 tablet every night ? ?PLEASE WEAR COMPRESSION HOSE ? ?CHECK BLOOD PRESSURE AT HOME AND RECORD READINGS ? ?Your physician has requested that you have an echocardiogram. Echocardiography is a painless test that uses sound waves to create images of your heart. It provides your doctor with information about the size and shape of your heart and how well your heart?s chambers and valves are working. This procedure takes approximately one hour. There are no restrictions for this procedure. ?  ?PLEASE CALL IN July TO MAKE A APPOINTMENT FOR AUGUST OR September  ? ?A ZIO PATCH HAS BEEN PLACED OM YOU FOR 14 DAYS YOU ARE GIVEN THE INSTRUCTIONS ON WHAT TO DO. ? ?At the [facility], you and your health needs are our priority. As part of our continuing mission to provide you with exceptional heart care, we have created designated Provider Care Teams. These Care Teams include your primary Cardiologist (physician) and Advanced Practice Providers (APPs- Physician Assistants and Nurse Practitioners) who all work together to provide you with the care you need, when you need it.  ? ?You may see any of the following providers on your designated Care Team at your next follow up: ?Dr Arvilla Meres ?Dr Marca Ancona ?Tonye Becket, NP ?Robbie Lis, PA ?Jessica Milford,NP ?Anna Genre, PA ?Karle Plumber, PharmD ? ? ?Please be sure to bring in all your medications bottles to every appointment.  ? ?If you have any questions or concerns before your next appointment please send [place] a message through [place] or call our office at [phone].   ? ?TO LEAVE A MESSAGE FOR THE NURSE SELECT OPTION 2, PLEASE LEAVE A MESSAGE INCLUDING: ?YOUR NAME ?DATE OF BIRTH ?CALL BACK NUMBER ?REASON FOR CALL**this is important as we prioritize the call backs ? ?YOU WILL RECEIVE A CALL BACK THE SAME DAY AS LONG AS YOU CALL BEFORE 4:00 PM ? ?

## 2021-08-12 NOTE — ED Provider Notes (Signed)
?  Physical Exam  ?BP (!) 105/57   Pulse 78   Temp 97.9 ?F (36.6 ?C) (Oral)   Resp 19   Ht 5\' 8"  (1.727 m)   Wt 80.7 kg   LMP  (LMP Unknown)   SpO2 97%   BMI 27.06 kg/m?  ? ?Physical Exam ?Vitals and nursing note reviewed.  ?Constitutional:   ?   General: She is not in acute distress. ?   Appearance: She is well-developed.  ?HENT:  ?   Head: Normocephalic and atraumatic.  ?Eyes:  ?   Conjunctiva/sclera: Conjunctivae normal.  ?Cardiovascular:  ?   Rate and Rhythm: Normal rate and regular rhythm.  ?   Heart sounds: No murmur heard. ?Pulmonary:  ?   Effort: Pulmonary effort is normal. No respiratory distress.  ?   Breath sounds: Normal breath sounds.  ?Abdominal:  ?   Palpations: Abdomen is soft.  ?   Tenderness: There is no abdominal tenderness.  ?Musculoskeletal:     ?   General: No swelling.  ?   Cervical back: Neck supple.  ?Skin: ?   General: Skin is warm and dry.  ?   Capillary Refill: Capillary refill takes less than 2 seconds.  ?Neurological:  ?   Mental Status: She is alert.  ?Psychiatric:     ?   Mood and Affect: Mood normal.  ? ? ?Procedures  ?Procedures ? ?ED Course / MDM  ? ?Clinical Course as of 08/12/21 0206  ?Sun Aug 11, 2021  ?2339 CHF EF 20%, recurrent syncope x1 year,  [MK]  ?  ?Clinical Course User Index ?[MK] Kalvin Buss, Debe Coder, MD  ? ?Medical Decision Making ?Amount and/or Complexity of Data Reviewed ?Labs: ordered. ?Radiology: ordered. ? ?Risk ?Prescription drug management. ? ? ?Patient received in handoff.  Patient presenting with syncopal episodes.  CT PE pending at time of signout.  CT PE is reassuringly negative.  Orthostatics performed and patient failed orthostatic testing dropping her blood pressures into the 80s and noticeably symptomatic.  When informing the patient that she should be admitted to the hospital for her likely symptomatic orthostatic hypotension and need for rehydration, the patient adamantly stated that she will not be admitted to the hospital and will leave [place].  I had a long discussion with the patient stating that I believe her recurrent syncope is due to multiple diuretic medications in the setting of poor oral intake as most of her oral hydration comes from sugary beverages including sweet tea and Sprite.  Due to her EF of 25 to 30% she will require careful rehydration with slow fluid administration and this should be done in a hospital setting.  She would also benefit from a repeat ultrasound of her heart.  Patient and her daughter state that they understand my concerns and the patient remains adamant that she does not want to be admitted to the hospital and thus she was discharged via informed discharge with very strict return precautions and instructions to hydrate orally with water only.  She was also instructed to call her cardiologist tomorrow to discuss her ER presentation today.  Upon discharge I again informed the patient that this is a poor decision and she remains steadfast in her desire to go home.  Thus the patient ultimately discharged via informed discharge. ? ? ? ? ?  ?Teressa Lower, MD ?08/12/21 0209 ? ?

## 2021-08-12 NOTE — Progress Notes (Signed)
[facility] Note    PCP: Dema Severin, NP  Cardiologist: Dr. Eldridge Dace HF: Dr. Gala Romney   HPI: Shelly [name] is a 51 y.o. woman with DM2, tobacco abuse, CAD and systolic HF diagnosed in 5/21 with EF 20%.  Admitted 5/21 with ADHF. Echo EF 20% with severe RV dysfunction. R/LHC showed diffuse non-obstructive CAD with chronic dissection/recannalization of mid LAD. Suspect she had an out of hospital MI at some point.  There were no interventional targets.  Outpatient cMRI 8/21 1.  Mild LV dilatation with severe systolic dysfunction (LVEF 30%) 2. Subendocardial LGE c/w prior LAD and RCA infarcts. >50% transmural LGE suggesting nonviability in basal to mid inferior walls, apical anterior/septal walls, and apex. <50% transmurality of LGE suggesting viability in mid anterior wall. 3.  Normal RV size and systolic function (EF 66%)  Echo 01/15/21:  EF 35-40% RV ok. BP low. Entresto decreased, lasix cut back to MWF.  Evaluated in ED 02/20/21 for LOC. Felt to be orthostatic, CT head negative.  Zio-14 day (only able to wear for 4) showed mostly SR, no high-grade arrhythmias.  Follow up 2/23 had syncopal episode 1 day prior. NYHA II, volume ok. She was referred to EP for ILR to exclude high-grade arrhythmias.   Seen in ED 08/11/21 for syncopal episode. CT negative for PE. Orthostatics + with BP dropping to 80s and symptomatic, advised admission for re-hydration but she left AMA.  Today she returns for acute visit after syncopal event yesterday (see above). "Something in my chest does not feel right." Unsure if she is feeling palpitations, but sensation in chest is worse when laying on left side. Breathing is OK, able to walk on flat ground without significant dyspnea. Denies abnormal bleeding, CP,  edema, or PND/Orthopnea. Appetite ok. No fever or chills. Does not weigh at home, legally blind. Taking all medications. Lives with husband. Her mother died in her 49's from "myocardial  fibrosis." Continues to smoke 1 ppd. Drinks a lot of soft drinks and tea.  Cardiac Studies: Echo (8/22): EF 35-40% RV ok.  Echo 5/21: EF 20% with severe RV dysfunction.  RHC 5/21 Ao = 106/75 (87) LV = 107/27 RA = 15 RV = 54/16 PA = 57/25 (40) PCW = 27 (v = 37) Fick cardiac output/index = 4.4/2.0 PVR = 1.4 WU SVR = 1297 Ao sat = 99% PA sat = 62%, 63%  Past Medical History:  Diagnosis Date   Anemia    Anxiety    Arthritis    Cataract    Mixed OU   CHF (congestive heart failure) (HCC)    Coronary artery disease    Diabetes mellitus without complication (HCC)    type 2   Diabetic retinopathy (HCC)    PDR OU   Dizziness    Headache    Cluster headaches in the past   History of kidney stones    "2 sitting"   Hyperlipidemia    Hypertension    Hypertensive retinopathy    OU   Myocardial infarction (HCC)    Pneumonia    Syncope    Tobacco abuse    Current Outpatient Medications  Medication Sig Dispense Refill   acetaminophen (TYLENOL) 500 MG tablet Take 1,000 mg by mouth every 6 (six) hours as needed for moderate pain or headache.     aspirin 81 MG chewable tablet Chew 81 mg by mouth at bedtime.     Aspirin-Caffeine (BAYER BACK & BODY PO) Take 2 tablets by mouth every 6 (  six) hours as needed (pain).     carvedilol (COREG) 3.125 MG tablet Take 1 tablet (3.125 mg total) by mouth 2 (two) times daily. 180 tablet 2   cholecalciferol (VITAMIN D) 25 MCG (1000 UNIT) tablet Take 1,000 Units by mouth every evening.     furosemide (LASIX) 20 MG tablet Take 1 tablet (20 mg total) by mouth 3 (three) times a week. 30 tablet 3   ibuprofen (ADVIL) 200 MG tablet Take 400 mg by mouth every 6 (six) hours as needed for headache or moderate pain.     KLOR-CON M10 10 MEQ tablet TAKE 2 TABLETS BY MOUTH DAILY 180 tablet 3   losartan (COZAAR) 25 MG tablet Take 0.5 tablets (12.5 mg total) by mouth at bedtime. 30 tablet 0   metFORMIN (GLUCOPHAGE) 1000 MG tablet Take 1,000 mg by mouth daily in  the afternoon.     Phenylephrine-Acetaminophen (SINUS CONGESTION/PAIN PO) Take 2 tablets by mouth every 6 (six) hours as needed (sinus).     spironolactone (ALDACTONE) 25 MG tablet Take 1 tablet (25 mg total) by mouth daily. 90 tablet 3   TRULICITY 3 MG/0.5ML SOPN Inject 3 mg into the skin every Monday.     dapagliflozin propanediol (FARXIGA) 10 MG TABS tablet Take 1 tablet (10 mg total) by mouth daily. 30 tablet 11   No current facility-administered medications for this encounter.   Allergies  Allergen Reactions   Atorvastatin     diarrhea   Rosuvastatin     diarrhea    Social History   Socioeconomic History   Marital status: Married    Spouse name: Not on file   Number of children: Not on file   Years of education: Not on file   Highest education level: Not on file  Occupational History   Not on file  Tobacco Use   Smoking status: Every Day    Packs/day: 1.50    Types: Cigarettes   Smokeless tobacco: Never  Vaping Use   Vaping Use: Never used  Substance and Sexual Activity   Alcohol use: Not Currently   Drug use: Never   Sexual activity: Not on file  Other Topics Concern   Not on file  Social History Narrative   Not on file   Social Determinants of Health   Financial Resource Strain: Not on file  Food Insecurity: Not on file  Transportation Needs: Not on file  Physical Activity: Not on file  Stress: Not on file  Social Connections: Not on file  Intimate Partner Violence: Not on file   Family History  Problem Relation Age of Onset   Sudden Cardiac Death Mother    CAD Father    CAD Paternal Grandmother    CAD Paternal Grandfather    BP (!) 88/60    Pulse 84    Wt 85.8 kg (189 lb 3.2 oz)    LMP  (LMP Unknown)    SpO2 99%    BMI 28.77 kg/m   Wt Readings from Last 3 Encounters:  08/12/21 85.8 kg (189 lb 3.2 oz)  08/11/21 80.7 kg (178 lb)  07/22/21 85.7 kg (189 lb)   BP (!) 88/60    Pulse 84    Wt 85.8 kg (189 lb 3.2 oz)    LMP  (LMP Unknown)    SpO2 99%     BMI 28.77 kg/m   PHYSICAL EXAM: General:  NAD. No resp difficulty HEENT: Legally blind Neck: Supple. No JVD. Carotids 2+ bilat; no bruits. No lymphadenopathy or  thryomegaly appreciated. Cor: PMI nondisplaced. Regular rate & rhythm. No rubs, gallops or murmurs. Lungs: Clear Abdomen: Soft, nontender, nondistended. No hepatosplenomegaly. No bruits or masses. Good bowel sounds. Extremities: No cyanosis, clubbing, rash, edema Neuro: Alert & oriented x 3, cranial nerves grossly intact. Moves all 4 extremities w/o difficulty. Affect pleasant.  ECG (reviewed from ED on 08/11/21): NSR  ReDs: 28%   ASSESSMENT & PLAN: 1. Syncope, recurrent - Suspect autonomic dysfunction in setting of advanced DM2.  - Zio 4 day (10/22): showed mostly SR, no high grade arrhythmia - Referred for ILR to exclude high-grade arrhythmias, has an appt in a few weeks with Dr. Ladona Ridgel. - Asked her to wear her compression hose, she will think about this. - In mean time, place Zio AT 14 day and update echo. - Labs from ED reviewed and look OK.  2. 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 R/LHC showed diffuse non-obstructive CAD with chronic dissection/recannalization of mid LAD. Suspect she had an out of hospital MI at some point.  There were no interventional targets. - cMRI 8/21: LVEF 30% priorLAD & RCA infarcts. RV ok  - Echo 01/15/21:  EF 35-40% RV ok  - Stable NYHA II Volume status ok today. ReDs 28% - GDMT limited by low BP & orthostasis. - Stop Entresto. - Start losartan 12.5 mg qhs. Check BP at home. - Continue spiro 25 mg daily. - Continue Coreg 3.125 mg bid. - Continue Jardiance 25 mg daily.  - Continue lasix M/W/F + as needed. - Update echo.  3. CAD: - LHC 5/21 showed diffuse non-obstructive CAD with chronic dissection/recannalization of mid LAD. Suspect she had an out of hospital MI at some point.  There were no interventional targets. - cMRI as above  c/w prior infarcts in LAD/RCA territories - No s/s angina - Continue ASA, Jardiance & Trulicity - Off Crestor and atorva with GI upset. - Followed by lipid clinic - pending reaptha start - Needs complete smoking cessation.  4. Type 2 DM with diabetic retinopathy:  - Management per PCP. - Continue Jardiance & Trulicity. - HgBA1c down to 6.5%.  5. Tobacco Abuse: - Discussed smoking cessation.  - She is not ready to quit.  6. HLD:  - Failed statins. - Followed by lipid clinic - pending reaptha start  Follow up in 6 months with Dr. Gala Romney.  Shelly Malta [place], FNP 08/12/21

## 2021-08-12 NOTE — Discharge Instructions (Signed)
You were seen in the emergency department for evaluation of passing out.  Your work-up in the emergency department is showing evidence that you are dehydrated and I think this is likely due to your combination of medications and the fact that your oral rehydration consists of primarily very sugary beverages.  While in the emergency department, your blood pressures were very low when standing up, and this can traditionally be linked to dehydration.  In the setting of your low heart function, this can be dangerous and you should stay in the hospital for gradual rehydration and repeat ultrasound.  You have elected not to stay in the hospital [place] and it is very important that you call your cardiologist tomorrow morning to have further discussions about this.  If you have additional episodes of passing out, chest pain, shortness of breath or any other concerning symptoms it is imperative that you return to the hospital immediately for admission. ?

## 2021-08-12 NOTE — Progress Notes (Signed)
ReDS Vest / Clip - 08/12/21 1100   ? ?  ? ReDS Vest / Clip  ? Station Marker C   ? Ruler Value 30.5   ? ReDS Value Range Low volume   ? ReDS Actual Value 28   ? ?  ?  ? ?  ? ? ?

## 2021-08-13 ENCOUNTER — Other Ambulatory Visit: Payer: Self-pay

## 2021-08-13 ENCOUNTER — Emergency Department (HOSPITAL_COMMUNITY): Payer: Commercial Managed Care - HMO

## 2021-08-13 ENCOUNTER — Emergency Department (HOSPITAL_COMMUNITY)
Admission: EM | Admit: 2021-08-13 | Discharge: 2021-08-14 | Disposition: A | Discharge status: Home / Self Care | Payer: Commercial Managed Care - HMO | Attending: Emergency Medicine | Admitting: Emergency Medicine

## 2021-08-13 ENCOUNTER — Other Ambulatory Visit (HOSPITAL_COMMUNITY): Payer: Self-pay

## 2021-08-13 DIAGNOSIS — Z7984 Long term (current) use of oral hypoglycemic drugs: Secondary | ICD-10-CM | POA: Diagnosis not present

## 2021-08-13 DIAGNOSIS — E11319 Type 2 diabetes mellitus with unspecified diabetic retinopathy without macular edema: Secondary | ICD-10-CM | POA: Insufficient documentation

## 2021-08-13 DIAGNOSIS — I5022 Chronic systolic (congestive) heart failure: Secondary | ICD-10-CM | POA: Diagnosis not present

## 2021-08-13 DIAGNOSIS — R0789 Other chest pain: Secondary | ICD-10-CM | POA: Insufficient documentation

## 2021-08-13 DIAGNOSIS — R062 Wheezing: Secondary | ICD-10-CM | POA: Diagnosis not present

## 2021-08-13 DIAGNOSIS — R079 Chest pain, unspecified: Secondary | ICD-10-CM | POA: Diagnosis present

## 2021-08-13 DIAGNOSIS — I110 Hypertensive heart disease with heart failure: Secondary | ICD-10-CM | POA: Insufficient documentation

## 2021-08-13 DIAGNOSIS — I2510 Atherosclerotic heart disease of native coronary artery without angina pectoris: Secondary | ICD-10-CM | POA: Diagnosis not present

## 2021-08-13 DIAGNOSIS — F17200 Nicotine dependence, unspecified, uncomplicated: Secondary | ICD-10-CM | POA: Diagnosis not present

## 2021-08-13 DIAGNOSIS — Z79899 Other long term (current) drug therapy: Secondary | ICD-10-CM | POA: Insufficient documentation

## 2021-08-13 DIAGNOSIS — Z7982 Long term (current) use of aspirin: Secondary | ICD-10-CM | POA: Insufficient documentation

## 2021-08-13 LAB — BASIC METABOLIC PANEL
Anion gap: 6 (ref 5–15)
BUN: 14 mg/dL (ref 6–20)
CO2: 28 mmol/L (ref 22–32)
Calcium: 9 mg/dL (ref 8.9–10.3)
Chloride: 100 mmol/L (ref 98–111)
Creatinine, Ser: 1.04 mg/dL — ABNORMAL HIGH (ref 0.44–1.00)
GFR, Estimated: >60 mL/min (ref >60)
Glucose, Bld: 120 mg/dL — ABNORMAL HIGH (ref 70–99)
Potassium: 4 mmol/L (ref 3.5–5.1)
Sodium: 134 mmol/L — ABNORMAL LOW (ref 135–145)

## 2021-08-13 LAB — CBC
HCT: 45.7 % (ref 36.0–46.0)
Hemoglobin: 15.1 g/dL — ABNORMAL HIGH (ref 12.0–15.0)
MCH: 28.3 pg (ref 26.0–34.0)
MCHC: 33 g/dL (ref 30.0–36.0)
MCV: 85.7 fL (ref 80.0–100.0)
Platelets: 300 10*3/uL (ref 150–400)
RBC: 5.33 MIL/uL — ABNORMAL HIGH (ref 3.87–5.11)
RDW: 12.4 % (ref 11.5–15.5)
WBC: 9.7 10*3/uL (ref 4.0–10.5)
nRBC: 0 % (ref 0.0–0.2)

## 2021-08-13 LAB — TROPONIN I (HIGH SENSITIVITY): Troponin I (High Sensitivity): 6 ng/L (ref <18)

## 2021-08-13 MED ORDER — FREESTYLE LIBRE 2 READER DEVI
0 refills | Status: DC
Start: 2021-08-13 — End: 2022-03-27

## 2021-08-13 MED ORDER — FREESTYLE LIBRE 2 SENSOR MISC
3 refills | Status: DC
  Filled 2021-08-13: qty 1, 14d supply, fill #0

## 2021-08-13 NOTE — ED Provider Triage Note (Signed)
Emergency Medicine Provider Triage Evaluation Note ? ?Shelly [name] , a 51 y.o. female  was evaluated in triage.  Pt complains of any chest pain for the last 2 days.  Seen for same symptoms 2 days ago, was worked up but declined admission.  Pain now increased and shooting towards her back with associated dizziness.  Hx of chronic heart failure with ejection fraction of 25 to 30%.  Denies abdominal pain.  Denies recent fever or illness. ? ?Review of Systems  ?Positive: As above ?Negative: As above ? ?Physical Exam  ?BP 121/78 (BP Location: Left Arm)   Pulse 85   Temp 98.6 ?F (37 ?C) (Oral)   Resp 18   LMP  (LMP Unknown)   SpO2 100%  ?Gen:   Awake, no distress   ?Resp:  Normal effort, CTAB ?MSK:   Moves extremities without difficulty  ?Other:  Presents in wheelchair.  Radial and DP/PT pulses 2+ bilaterally.  Chest pain not reproducible. ? ?Medical Decision Making  ?Medically screening exam initiated at 5:05 PM.  Appropriate orders placed.  Shelly [name] was informed that the remainder of the evaluation will be completed by another provider, this initial triage assessment does not replace that evaluation, and the importance of remaining in the ED until their evaluation is complete. ? ?Labs, imaging, EKG ordered ?  ?Prince Rome, PA-C ?[id number] ? ?

## 2021-08-13 NOTE — ED Triage Notes (Signed)
Pt arrives with continued chest pain x 2 days. Seen for same here two days ago and declined admission. Pain continues but now pain shooting through to back and has associated dizziness and sob.  ?

## 2021-08-14 LAB — TROPONIN I (HIGH SENSITIVITY): Troponin I (High Sensitivity): 8 ng/L (ref <18)

## 2021-08-14 MED ORDER — LORAZEPAM 1 MG PO TABS
1.0000 mg | ORAL_TABLET | Freq: Once | ORAL | Status: DC
  Filled 2021-08-14: qty 1

## 2021-08-14 NOTE — ED Provider Notes (Addendum)
[facility] EMERGENCY DEPARTMENT Provider Note   CSN: [id number] Arrival date & time: 08/13/21  1612     History  Chief Complaint  Patient presents with   Chest Pain    Shelly [name] is a 51 y.o. female.  HPI     This is a 51 year old female with a history of diabetes, CHF with EF of 20%, hypertension, syncope who presents with "feeling funny in my chest."  Patient was seen and evaluated on Sunday.  She was having similar symptoms at that time but also had had an episode of syncope.  Since Sunday she has not had a syncopal episode.  She is also followed up with cardiology and is now wearing a 14-day monitor.  Patient states that she does not have chest pain but "something does not feel right."  She denies overt palpitations.  At times she states she has trouble taking a deep breath.  She denies any alcohol or drug use.  Denies any recent changes in medications or diet.  She denies any significant stressors at home.  Chart reviewed.  She was seen and evaluated on Sunday in the emergency room.  She was found to be orthostatic.  She had a negative PE study at the time.  She was recommended to be admitted for gentle rehydration; however, she declined.  She saw cardiology as an outpatient yesterday and was placed on a 14-day monitor.  Home Medications Prior to Admission medications   Medication Sig Start Date End Date Taking? Authorizing Provider  acetaminophen (TYLENOL) 500 MG tablet Take 1,000 mg by mouth every 6 (six) hours as needed for moderate pain or headache.    [provider]  aspirin 81 MG chewable tablet Chew 81 mg by mouth at bedtime.    [provider]  Aspirin-Caffeine (BAYER BACK & BODY PO) Take 2 tablets by mouth every 6 (six) hours as needed (pain).    [provider]  carvedilol (COREG) 3.125 MG tablet Take 1 tablet (3.125 mg total) by mouth 2 (two) times daily. 08/05/21   Bensimhon, Shaune Pascal, MD  cholecalciferol (VITAMIN D)  25 MCG (1000 UNIT) tablet Take 1,000 Units by mouth every evening.    [provider]  Continuous Blood Gluc Receiver (FREESTYLE LIBRE 2 READER) DEVI Use as directed 08/13/21     Continuous Blood Gluc Sensor (FREESTYLE LIBRE 2 SENSOR) MISC Use as directed 08/13/21     dapagliflozin propanediol (FARXIGA) 10 MG TABS tablet Take 1 tablet (10 mg total) by mouth daily. 08/12/21   Milford, Maricela Bo, FNP  furosemide (LASIX) 20 MG tablet Take 1 tablet (20 mg total) by mouth 3 (three) times a week. 01/16/21   Bensimhon, Shaune Pascal, MD  ibuprofen (ADVIL) 200 MG tablet Take 400 mg by mouth every 6 (six) hours as needed for headache or moderate pain.    [provider]  KLOR-CON M10 10 MEQ tablet TAKE 2 TABLETS BY MOUTH DAILY 04/18/21   Bensimhon, Shaune Pascal, MD  losartan (COZAAR) 25 MG tablet Take 0.5 tablets (12.5 mg total) by mouth at bedtime. 08/12/21   Milford, Maricela Bo, FNP  metFORMIN (GLUCOPHAGE) 1000 MG tablet Take 1,000 mg by mouth daily in the afternoon.    [provider]  Phenylephrine-Acetaminophen (SINUS CONGESTION/PAIN PO) Take 2 tablets by mouth every 6 (six) hours as needed (sinus).    [provider]  spironolactone (ALDACTONE) 25 MG tablet Take 1 tablet (25 mg total) by mouth daily. 08/05/21   Bensimhon,  Shaune Pascal, MD  TRULICITY 3 0000000 SOPN Inject 3 mg into the skin every Monday. 08/29/20   [provider]      Allergies    Atorvastatin and Rosuvastatin    Review of Systems   Review of Systems  Constitutional:  Negative for fever.  Respiratory:  Negative for shortness of breath.   Cardiovascular:  Negative for chest pain.       Chest "feels funny."  All other systems reviewed and are negative.  Physical Exam Updated Vital Signs BP 113/76    Pulse 71    Temp 97.6 F (36.4 C) (Oral)    Resp 16    LMP  (LMP Unknown)    SpO2 93%  Physical Exam Vitals and nursing note reviewed.  Constitutional:      Appearance: She is well-developed. She is not  ill-appearing.  HENT:     Head: Normocephalic and atraumatic.  Eyes:     Pupils: Pupils are equal, round, and reactive to light.  Cardiovascular:     Rate and Rhythm: Regular rhythm. Tachycardia present.     Heart sounds: Normal heart sounds.  Pulmonary:     Effort: Pulmonary effort is normal. No respiratory distress.     Breath sounds: Wheezing present.  Chest:     Comments: Event monitor in place Abdominal:     General: Bowel sounds are normal.     Palpations: Abdomen is soft.  Musculoskeletal:     Cervical back: Neck supple.     Right lower leg: No tenderness. No edema.     Left lower leg: No tenderness. No edema.  Skin:    General: Skin is warm and dry.  Neurological:     Mental Status: She is alert and oriented to person, place, and time.  Psychiatric:        Mood and Affect: Mood normal.    ED Results / Procedures / Treatments   Labs (all labs ordered are listed, but only abnormal results are displayed) Labs Reviewed  BASIC METABOLIC PANEL - Abnormal; Notable for the following components:      Result Value   Sodium 134 (*)    Glucose, Bld 120 (*)    Creatinine, Ser 1.04 (*)    All other components within normal limits  CBC - Abnormal; Notable for the following components:   RBC 5.33 (*)    Hemoglobin 15.1 (*)    All other components within normal limits  TROPONIN I (HIGH SENSITIVITY)  TROPONIN I (HIGH SENSITIVITY)    EKG EKG Interpretation  Date/Time:  Tuesday August 13 2021 16:27:15 EST Ventricular Rate:  83 PR Interval:  194 QRS Duration: 88 QT Interval:  346 QTC Calculation: 406 R Axis:   80 Text Interpretation: Normal sinus rhythm Possible Inferior infarct , age undetermined Anterior infarct , age undetermined ST-t wave abnormality Artifact Abnormal ECG Confirmed by Carmin Muskrat [id number]) on 08/13/2021 4:42:16 PM  Radiology DG Chest 2 View  Result Date: 08/13/2021 CLINICAL DATA:  Chest pain short of breath EXAM: CHEST - 2 VIEW COMPARISON:   08/11/2021 FINDINGS: The heart size and mediastinal contours are within normal limits. Both lungs are clear. The visualized skeletal structures are unremarkable. Electronic device overlying the left anterior chest. IMPRESSION: No active cardiopulmonary disease. Electronically Signed   By: Franchot Gallo M.D.   On: 08/13/2021 17:34    Procedures Procedures    Medications Ordered in ED Medications  LORazepam (ATIVAN) tablet 1 mg (1 mg Oral Not Given 08/14/21 0350)  ED Course/ Medical Decision Making/ A&P Clinical Course as of 08/14/21 0620  Wed Aug 14, 2021  0620 Answered all her questions at bedside.  She was concerned about mother going home and having recurrent syncope.  She has not had a syncopal episode since before being in the ED on Sunday and the sounds like this is a issue for the last year or so.  At this time, it does not appear to be an acute arrhythmia although she has an appropriate event monitor on.  I recommend follow-up with cardiology and she may potentially need further work-up by neurology as an outpatient. [CH]    Clinical Course User Index [CH] Ariam Mol, Barbette Hair, MD                           Medical Decision Making Amount and/or Complexity of Data Reviewed Labs: ordered. Radiology: ordered.  Risk Prescription drug management.   This patient presents to the ED for concern of atypical chest symptoms, this involves an extensive number of treatment options, and is a complaint that carries with it a high risk of complications and morbidity.  The differential diagnosis includes palpitations, arrhythmia, less likely PE or primary ACS given recent work-up  MDM:    Patient presents with funny feeling in her chest.  Similar to prior visit.  She currently has on an event monitor.  She is nontoxic and vital signs are largely reassuring.  Her EKG is nonischemic and without evidence of arrhythmia.  She does have a significant cardiomyopathy with chronic systolic heart failure.   She does not appear volume overloaded.  She is not hypoxic.  She is not in distress.  Troponin x2 negative.  Labs are largely reassuring including metabolites.  I offered the patient Ativan to treat any underlying stress or anxiety.  Patient declined.  I discussed with her that her work-up continues to be reassuring.  Recommend close follow-up with cardiology. (Labs, imaging)  Labs: I Ordered, and personally interpreted labs.  The pertinent results include: Troponin x2 normal  Imaging Studies ordered: I ordered imaging studies including chest x-ray reassuring I independently visualized and interpreted imaging. I agree with the radiologist interpretation  Additional history obtained from family at bedside.  External records from outside source obtained and reviewed including outside cardiology note  Critical Interventions: N/A  Consultations: I requested consultation with the NA,  and discussed lab and imaging findings as well as pertinent plan - they recommend: N/A  Cardiac Monitoring: The patient was maintained on a cardiac monitor.  I personally viewed and interpreted the cardiac monitored which showed an underlying rhythm of: Normal sinus rhythm  Reevaluation: After the interventions noted above, I reevaluated the patient and found that they have :improved   Considered admission for: Ongoing chest pain  Social Determinants of Health: lives independently  Disposition: Discharge  Co morbidities that complicate the patient evaluation  Past Medical History:  Diagnosis Date   Anemia    Anxiety    Arthritis    Cataract    Mixed OU   CHF (congestive heart failure) ([place])    Coronary artery disease    Diabetes mellitus without complication ([place])    type 2   Diabetic retinopathy ([place])    PDR OU   Dizziness    Headache    Cluster headaches in the past   History of kidney stones    "2 sitting"   Hyperlipidemia    Hypertension  Hypertensive retinopathy    OU    Myocardial infarction ([place])    Pneumonia    Syncope    Tobacco abuse      Medicines Meds ordered this encounter  Medications   LORazepam (ATIVAN) tablet 1 mg    I have reviewed the patients home medicines and have made adjustments as needed  Problem List / ED Course: Problem List Items Addressed This Visit   None Visit Diagnoses     Atypical chest pain    -  Primary                   Final Clinical Impression(s) / ED Diagnoses Final diagnoses:  Atypical chest pain    Rx / DC Orders ED Discharge Orders     None         Davinia Riccardi, Barbette Hair, MD 08/14/21 [id number]    Merryl Hacker, MD 08/14/21 [phone]

## 2021-08-14 NOTE — Discharge Instructions (Signed)
You were seen today for atypical chest symptoms.  Again your work-up is reassuring.  Keep your event monitor on and follow-up closely with cardiology.  Avoid any stimulant medications or caffeine. ?

## 2021-08-30 ENCOUNTER — Ambulatory Visit (HOSPITAL_COMMUNITY)
Admission: RE | Admit: 2021-08-30 | Discharge: 2021-08-30 | Disposition: A | Discharge status: Home / Self Care | Payer: Commercial Managed Care - HMO | Source: Ambulatory Visit | Attending: Family Medicine | Admitting: Family Medicine

## 2021-08-30 ENCOUNTER — Other Ambulatory Visit: Payer: Self-pay

## 2021-08-30 ENCOUNTER — Telehealth: Payer: Self-pay | Admitting: Internal Medicine

## 2021-08-30 DIAGNOSIS — F17200 Nicotine dependence, unspecified, uncomplicated: Secondary | ICD-10-CM | POA: Diagnosis not present

## 2021-08-30 DIAGNOSIS — I2510 Atherosclerotic heart disease of native coronary artery without angina pectoris: Secondary | ICD-10-CM | POA: Diagnosis not present

## 2021-08-30 DIAGNOSIS — I110 Hypertensive heart disease with heart failure: Secondary | ICD-10-CM | POA: Insufficient documentation

## 2021-08-30 DIAGNOSIS — E119 Type 2 diabetes mellitus without complications: Secondary | ICD-10-CM | POA: Insufficient documentation

## 2021-08-30 DIAGNOSIS — E785 Hyperlipidemia, unspecified: Secondary | ICD-10-CM | POA: Diagnosis not present

## 2021-08-30 DIAGNOSIS — I252 Old myocardial infarction: Secondary | ICD-10-CM | POA: Diagnosis not present

## 2021-08-30 DIAGNOSIS — I5022 Chronic systolic (congestive) heart failure: Secondary | ICD-10-CM | POA: Insufficient documentation

## 2021-08-30 LAB — ECHOCARDIOGRAM COMPLETE
AR max vel: 1.65 cm2
AV Area VTI: 1.58 cm2
AV Area mean vel: 1.64 cm2
AV Mean grad: 3 mmHg
AV Peak grad: 5.3 mmHg
Ao pk vel: 1.15 m/s
Area-P 1/2: 3.46 cm2
S' Lateral: 5 cm

## 2021-08-30 MED ORDER — PERFLUTREN LIPID MICROSPHERE
1.0000 mL | INTRAVENOUS | Status: AC | PRN
  Administered 2021-08-30: 2 mL via INTRAVENOUS

## 2021-08-30 NOTE — Telephone Encounter (Signed)
?  Pt is checking if Dr. Rennis Golden got the copy of her lipid result from Dr. Mauricio Po. She said it was sent out yesterday  ?

## 2021-08-30 NOTE — Telephone Encounter (Signed)
-  Pt state her pcp sent lab results yesterday and inquiring if MD received them. ?-Will forward to nurse ?

## 2021-08-30 NOTE — Telephone Encounter (Signed)
Spoke with patient. Notified her labs received for 09/02/21 visit ?

## 2021-09-02 ENCOUNTER — Telehealth (INDEPENDENT_AMBULATORY_CARE_PROVIDER_SITE_OTHER): Payer: Managed Care, Other (non HMO) | Admitting: Internal Medicine

## 2021-09-02 ENCOUNTER — Telehealth: Payer: Self-pay | Admitting: Internal Medicine

## 2021-09-02 ENCOUNTER — Encounter: Payer: Self-pay | Admitting: Internal Medicine

## 2021-09-02 ENCOUNTER — Other Ambulatory Visit (HOSPITAL_COMMUNITY): Payer: Self-pay

## 2021-09-02 VITALS — BP 111/74 | HR 96 | Wt 175.0 lb

## 2021-09-02 DIAGNOSIS — I5022 Chronic systolic (congestive) heart failure: Secondary | ICD-10-CM

## 2021-09-02 DIAGNOSIS — E785 Hyperlipidemia, unspecified: Secondary | ICD-10-CM | POA: Diagnosis not present

## 2021-09-02 DIAGNOSIS — I2510 Atherosclerotic heart disease of native coronary artery without angina pectoris: Secondary | ICD-10-CM

## 2021-09-02 DIAGNOSIS — Z789 Other specified health status: Secondary | ICD-10-CM

## 2021-09-02 MED ORDER — REPATHA SURECLICK 140 MG/ML ~~LOC~~ SOAJ
1.0000 | SUBCUTANEOUS | 11 refills | Status: DC
  Filled 2021-09-02 – 2021-09-25 (×5): qty 2, 28d supply, fill #0
  Filled 2021-10-21: qty 2, 28d supply, fill #1
  Filled 2021-11-21: qty 2, 28d supply, fill #2
  Filled 2021-12-16: qty 2, 28d supply, fill #3
  Filled 2022-01-07: qty 2, 28d supply, fill #4
  Filled 2022-02-03: qty 2, 28d supply, fill #5
  Filled 2022-03-05: qty 2, 28d supply, fill #6
  Filled 2022-04-07: qty 2, 28d supply, fill #7
  Filled 2022-05-05: qty 2, 28d supply, fill #8
  Filled 2022-05-28: qty 2, 28d supply, fill #9
  Filled 2022-06-29: qty 2, 28d supply, fill #10
  Filled 2022-07-28: qty 2, 28d supply, fill #11

## 2021-09-02 NOTE — Telephone Encounter (Signed)
PA for repatha sureclick submitted via CMM ?(Key: [id number]) ?

## 2021-09-02 NOTE — Addendum Note (Signed)
Addended by: Lindell Spar on: 09/02/2021 08:51 AM ? ? Modules accepted: Orders ? ?

## 2021-09-02 NOTE — Telephone Encounter (Signed)
?  Patient Consent for Virtual Visit  ? ? ?   ? ?Shelly [name] has provided verbal consent on 09/02/2021 for a virtual visit (video or telephone). ? ? ?CONSENT FOR VIRTUAL VISIT FOR:  Shelly [name]  ?By participating in this virtual visit I agree to the following: ? ?I hereby voluntarily request, consent and authorize [facility] and its employed or contracted physicians, physician assistants, nurse practitioners or other licensed health care professionals (the Practitioner), to provide me with telemedicine health care services (the ?Services") as deemed necessary by the treating Practitioner. I acknowledge and consent to receive the Services by the Practitioner via telemedicine. I understand that the telemedicine visit will involve communicating with the Practitioner through live audiovisual communication technology and the disclosure of certain medical information by electronic transmission. I acknowledge that I have been given the opportunity to request an in-person assessment or other available alternative prior to the telemedicine visit and am voluntarily participating in the telemedicine visit. ? ?I understand that I have the right to withhold or withdraw my consent to the use of telemedicine in the course of my care at any time, without affecting my right to future care or treatment, and that the Practitioner or I may terminate the telemedicine visit at any time. I understand that I have the right to inspect all information obtained and/or recorded in the course of the telemedicine visit and may receive copies of available information for a reasonable fee.  I understand that some of the potential risks of receiving the Services via telemedicine include:  ?Delay or interruption in medical evaluation [name] to technological equipment failure or disruption; ?Information transmitted may not be sufficient (e.g. poor resolution of images) to allow for appropriate medical decision making by the Practitioner;  and/or  ?In rare instances, security protocols could fail, causing a breach of personal health information. ? ?Furthermore, I acknowledge that it is my responsibility to provide information about my medical history, conditions and care that is complete and accurate to the best of my ability. I acknowledge that Practitioner's advice, recommendations, and/or decision may be based on factors not within their control, such as incomplete or inaccurate data provided by me or distortions of diagnostic images or specimens that may result from electronic transmissions. I understand that the practice of medicine is not an exact science and that Practitioner makes no warranties or guarantees regarding treatment outcomes. I acknowledge that a copy of this consent can be made available to me via my patient portal [facility] MyChart), or I can request a printed copy by calling the office of [facility].   ? ?I understand that my insurance will be billed for this visit.  ? ?I have read or had this consent read to me. ?I understand the contents of this consent, which adequately explains the benefits and risks of the Services being provided via telemedicine.  ?I have been provided ample opportunity to ask questions regarding this consent and the Services and have had my questions answered to my satisfaction. ?I give my informed consent for the services to be provided through the use of telemedicine in my medical care ? ? ? ?

## 2021-09-02 NOTE — Progress Notes (Signed)
? ?Virtual Visit via Video Note  ? ?This visit type was conducted due to national recommendations for restrictions regarding the COVID-19 Pandemic (e.g. social distancing) in an effort to limit this patient's exposure and mitigate transmission in our community.  Due to her co-morbid illnesses, this patient is at least at moderate risk for complications without adequate follow up.  This format is felt to be most appropriate for this patient at this time.  All issues noted in this document were discussed and addressed.  A limited physical exam was performed with this format.  Please refer to the patient's chart for her consent to telehealth for [facility]. ? ?   ? ?Date:  09/02/2021  ? ?ID:  [name], DOB 11-10-1970, MRN [id number] ?The patient was identified using 2 identifiers. ? ?Evaluation Performed:  New Patient Evaluation ? ?Patient Location:  ?[address] ?[place] [address] ? ?Provider location:   ?[address] ?[place], [place] [address] ? ?PCP:  Dema Severin, NP  ?Cardiologist:  None ?Electrophysiologist:  None  ? ?Chief Complaint:  Manage dyslipidemia ? ?History of Present Illness:   ? ?Shelly [name] is a 51 y.o. female who presents via audio/video conferencing for a telehealth visit today.  This is a pleasant 51 year old female patient of Dr. Gala Romney kindly referred from the advanced heart failure clinic for evaluation management of dyslipidemia.  She has a history of ischemic cardiomyopathy and presumed prior out of hospital MI in the past with LVEF as low as 20%, more recently up to 40%.  She has dyslipidemia as well and has been trialed on rosuvastatin and atorvastatin, both of which caused her terrible diarrhea.  She had similar issues on metformin and currently is on Jardiance.  She had a recent lipid profile which showed an LDL of 134 in August and is above target LDL less than 70.  Specifically, her total cholesterol was 206, HDL 42, triglycerides 146 and LDL 134.   She is referred for evaluation and consideration of possible PCSK9 inhibitor. ? ?09/02/2021 ? ?[name] returns today for follow-up.  Since I last saw her she has had a number of issues including worsening heart failure, syncope and a reduction in LVEF down to 20%.  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  We have discussed repeating her labs and starting a PCSK9 inhibitor, however she did not have her labs repeated until March 7.  Those demonstrated total cholesterol 214, triglycerides 131, LDL 146 and HDL 42.  LDL remains about 50% higher than target less than 70.  We again discussed a PCSK9 inhibitor for her as the best option.  She does use Trulicity and is used to giving herself injections. ? ?The patient does not have symptoms concerning for COVID-19 infection (fever, chills, cough, or new SHORTNESS OF BREATH).  ? ? ?Prior CV studies:   ?The following studies were reviewed today: ? ?Chart reviewed, lab work ? ?PMHx:  ?Past Medical History:  ?Diagnosis Date  ? Anemia   ? Anxiety   ? Arthritis   ? Cataract   ? Mixed OU  ? CHF (congestive heart failure) (HCC)   ? Coronary artery disease   ? Diabetes mellitus without complication (HCC)   ? type 2  ? Diabetic retinopathy (HCC)   ? PDR OU  ? Dizziness   ? Headache   ? Cluster headaches in the past  ? History of kidney stones   ? "  2 sitting"  ? Hyperlipidemia   ? Hypertension   ? Hypertensive retinopathy   ? OU  ? Myocardial infarction [facility])   ? Pneumonia   ? Syncope   ? Tobacco abuse   ? ? ?Past Surgical History:  ?Procedure Laterality Date  ? EYE SURGERY Left 08/23/2020  ? PPV - Dr. [name]  ? INJECTION OF SILICONE OIL Left 08/23/2020  ? Procedure: INJECTION OF SILICONE OIL;  Surgeon: [name], MD;  Location: [facility] OR;  Service: Ophthalmology;  Laterality: Left;  ? INJECTION OF SILICONE OIL Right 10/04/2020  ? Procedure: INJECTION OF SILICONE OIL;  Surgeon: [name], MD;  Location: [facility] OR;   Service: Ophthalmology;  Laterality: Right;  ? MEMBRANE PEEL Left 08/23/2020  ? Procedure: MEMBRANE PEEL;  Surgeon: [name], MD;  Location: [facility] OR;  Service: Ophthalmology;  Laterality: Left;  ? MEMBRANE PEEL Right 10/04/2020  ? Procedure: MEMBRANE PEEL;  Surgeon: [name], MD;  Location: [facility] OR;  Service: Ophthalmology;  Laterality: Right;  ? PARS PLANA VITRECTOMY Left 08/23/2020  ? Procedure: PARS PLANA VITRECTOMY WITH 25 GAUGE WITH ENDOLASER;  Surgeon: [name], MD;  Location: [facility] OR;  Service: Ophthalmology;  Laterality: Left;  ? PARS PLANA VITRECTOMY Right 10/04/2020  ? Procedure: PARS PLANA VITRECTOMY WITH 25 GAUGE, INJECTION OF AVASTIN;  Surgeon: [name], MD;  Location: [facility] OR;  Service: Ophthalmology;  Laterality: Right;  ? PHOTOCOAGULATION WITH LASER Right 10/04/2020  ? Procedure: PHOTOCOAGULATION WITH LASER;  Surgeon: [name], MD;  Location: [facility] OR;  Service: Ophthalmology;  Laterality: Right;  ? 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/23/2020  ? Procedure: REPAIR OF COMPLEX TRACTION RETINAL DETACHMENT;  Surgeon: [name], MD;  Location: [facility] OR;  Service: Ophthalmology;  Laterality: Left;  ? RIGHT/LEFT HEART CATH AND CORONARY ANGIOGRAPHY N/A 10/14/2019  ? Procedure: RIGHT/LEFT HEART CATH AND CORONARY ANGIOGRAPHY;  Surgeon: Dolores Patty, MD;  Location: [facility] INVASIVE CV LAB;  Service: Cardiovascular;  Laterality: N/A;  ? ? ?FAMHx:  ?Family History  ?Problem Relation Age of Onset  ? Sudden Cardiac Death Mother   ? CAD Father   ? CAD Paternal Grandmother   ? CAD Paternal Grandfather   ? ? ?SOCHx:  ? reports that she has been smoking cigarettes. She has been smoking an average of 1.5 packs per day. She has never used smokeless tobacco. She reports that she does not currently use alcohol. She reports that she does not use drugs. ? ?ALLERGIES:  ?Allergies  ?Allergen Reactions  ? Atorvastatin   ?  diarrhea  ? Rosuvastatin   ?  diarrhea  ? ? ?MEDS: ? ?Current Meds  ?Medication  Sig  ? acetaminophen (TYLENOL) 500 MG tablet Take 1,000 mg by mouth every 6 (six) hours as needed for moderate pain or headache.  ? aspirin 81 MG chewable tablet Chew 81 mg by mouth at bedtime.  ? Aspirin-Caffeine (BAYER BACK & BODY PO) Take 2 tablets by mouth every 6 (six) hours as needed (pain).  ? carvedilol (COREG) 3.125 MG tablet Take 1 tablet (3.125 mg total) by mouth 2 (two) times daily.  ? cholecalciferol (VITAMIN D) 25 MCG (1000 UNIT) tablet Take 1,000 Units by mouth every evening.  ? Continuous Blood Gluc Receiver (FREESTYLE LIBRE 2 READER) DEVI Use as directed  ? Continuous Blood Gluc Sensor (FREESTYLE LIBRE 2 SENSOR) MISC Use as directed  ? dapagliflozin propanediol (FARXIGA) 10 MG TABS tablet Take 1 tablet (10 mg total) by mouth daily.  ? furosemide (LASIX)  20 MG tablet Take 1 tablet (20 mg total) by mouth 3 (three) times a week.  ? ibuprofen (ADVIL) 200 MG tablet Take 400 mg by mouth every 6 (six) hours as needed for headache or moderate pain.  ? KLOR-CON M10 10 MEQ tablet TAKE 2 TABLETS BY MOUTH DAILY  ? losartan (COZAAR) 25 MG tablet Take 0.5 tablets (12.5 mg total) by mouth at bedtime.  ? metFORMIN (GLUCOPHAGE) 1000 MG tablet Take 1,000 mg by mouth daily in the afternoon.  ? Phenylephrine-Acetaminophen (SINUS CONGESTION/PAIN PO) Take 2 tablets by mouth every 6 (six) hours as needed (sinus).  ? spironolactone (ALDACTONE) 25 MG tablet Take 1 tablet (25 mg total) by mouth daily.  ? TRULICITY 3 MG/0.5ML SOPN Inject 3 mg into the skin every Monday.  ?  ? ?ROS: ?Pertinent items noted in HPI and remainder of comprehensive ROS otherwise negative. ? ?Labs/Other Tests and Data Reviewed:   ? ?Recent Labs: ?07/22/2021: ALT 8 ?08/11/2021: Magnesium 2.3; TSH 1.016 ?08/13/2021: BUN 14; Creatinine, Ser 1.04; Hemoglobin 15.1; Platelets 300; Potassium 4.0; Sodium 134  ? ?Recent Lipid Panel ?Lab Results  ?Component Value Date/Time  ? CHOL 113 05/01/2020 10:59 AM  ? TRIG 185 (H) 05/01/2020 10:59 AM  ? HDL 32 (L) 05/01/2020  10:59 AM  ? CHOLHDL 3.5 05/01/2020 10:59 AM  ? LDLCALC 44 05/01/2020 10:59 AM  ? ? ?Wt Readings from Last 3 Encounters:  ?09/02/21 175 lb (79.4 kg)  ?08/12/21 189 lb 3.2 oz (85.8 kg)  ?08/11/21 178 lb (80.7

## 2021-09-02 NOTE — Patient Instructions (Signed)
Medication Instructions:  ?Dr. Rennis Golden recommends Repatha 140mg /mL (PCSK9). This is an injectable cholesterol medication self-administered once every 14 days. This medication will likely need prior approval with your insurance company, which we will work on. If the medication is not approved initially, we may need to do an appeal with your insurance. Sometimes your insurance company may have a preference for Repatha or alterative, Praluent. We will let you know which is approved with your plan.  ? ?Administer medication in area of fatty tissue such as abdomen, outer thigh, back of upper arm - and rotate site with each injection ?Store medication in refrigerator until ready to administer - allow to sit at room temp for 30 mins - 1 hour prior to injection ?Dispose of medication in a SHARPS container - your pharmacy should be able to direct you on this and proper disposal  ? ?If you need a co-pay card for Repatha: >> paying for Repatha or red box that says "Repatha Copay Card" in top right ?If you need a co-pay card for Praluent: [email or web address] >> starting & paying for Praluent ? ?Patient Assistance:   ?The PAN Foundation: https://www.panfoundation.org/disease-funds/hypercholesterolemia/ ?-- can sign up for wait list ? ?The [employer] foundation offers assistance to help pay for medication copays.  They will cover copays for all cholesterol lowering meds, including statins, fibrates, omega-3 fish oils like Vascepa, ezetimibe, Repatha, Praluent, Nexletol, Nexlizet.  The cards are usually good for $2,500 or 12 months, whichever comes first. ?Go to healthwellfoundation.org ?Click on ?Apply Now? ?Answer questions as to whom is applying (patient or representative) ?Your disease fund will be ?hypercholesterolemia - Medicare access? ?They will ask questions about finances and which medications you are taking for cholesterol ?When you submit, the approval is usually within minutes.  You will need to print the card  information from the site ?You will need to show this information to your pharmacy, they will bill your Medicare Part D plan first -then bill Health Well --for the copay.   ?You can also call them at [phone], although the hold times can be quite long.  ? ? ? ?*If you need a refill on your cardiac medications before your next appointment, please call your pharmacy* ? ? ?Lab Work: ?FASTING lab work to check cholesterol in 3-4 months ?-- complete about 1 week before next appointment with Dr. [phone]  ? ?If you have labs (blood work) drawn today and your tests are completely normal, you will receive your results only by: ?MyChart Message (if you have MyChart) OR ?A paper copy in the mail ?If you have any lab test that is abnormal or we need to change your treatment, we will call you to review the results. ? ? ?Testing/Procedures: ?NONE ? ? ?Follow-Up: ?At [facility], you and your health needs are our priority.  As part of our continuing mission to provide you with exceptional heart care, we have created designated Provider Care Teams.  These Care Teams include your primary Cardiologist (physician) and Advanced Practice Providers (APPs -  Physician Assistants and Nurse Practitioners) who all work together to provide you with the care you need, when you need it. ? ?We recommend signing up for the patient portal called "MyChart".  Sign up information is provided on this After Visit Summary.  MyChart is used to connect with patients for Virtual Visits (Telemedicine).  Patients are able to view lab/test results, encounter notes, upcoming appointments, etc.  Non-urgent messages can be sent to your provider as well.   ?  To learn more about what you can do with MyChart, go to [email or web address].   ? ?Your next appointment:   ?3-4 month(s) ? ?The format for your next appointment:   ?In Person or Virtual ? ?Provider:   ?Zoila Shutter, MD ? ?

## 2021-09-04 ENCOUNTER — Other Ambulatory Visit (HOSPITAL_COMMUNITY): Payer: Self-pay

## 2021-09-06 ENCOUNTER — Other Ambulatory Visit (HOSPITAL_COMMUNITY): Payer: Self-pay

## 2021-09-06 ENCOUNTER — Telehealth: Payer: Self-pay | Admitting: Internal Medicine

## 2021-09-06 MED ORDER — TRULICITY 3 MG/0.5ML ~~LOC~~ SOAJ
SUBCUTANEOUS | 0 refills | Status: DC
  Filled 2021-09-06: qty 6, 84d supply, fill #0

## 2021-09-06 NOTE — Telephone Encounter (Signed)
Pt c/o medication issue: ? ?1. Name of Medication:  ? Evolocumab (REPATHA SURECLICK) 140 MG/ML SOAJ  ? ? ?2. How are you currently taking this medication (dosage and times per day)? N/A ? ?3. Are you having a reaction (difficulty breathing--STAT)? N/A ? ?4. What is your medication issue? PA is needed for medication ?

## 2021-09-06 NOTE — Telephone Encounter (Signed)
Called the patient to let her know that the PA had been started ?

## 2021-09-11 ENCOUNTER — Other Ambulatory Visit (HOSPITAL_COMMUNITY): Payer: Self-pay

## 2021-09-11 NOTE — Telephone Encounter (Addendum)
Attempted to call insurance company as listed on insurance card - was on hold 30 minutes ? ?Called pharmacy to check on status - still showing PA required ? ?Was given a different BIN ([id number]), PCN (PEU), ID: [id number], RxGrp: [id number] ?Will submit with this info ? ?Attempted PA in CMM portal ?Noticed received:  ?CaseId:[id number];Status:In Process; ?

## 2021-09-12 ENCOUNTER — Telehealth: Payer: Self-pay | Admitting: Internal Medicine

## 2021-09-12 NOTE — Telephone Encounter (Signed)
Pt left VM on triage line at [facility] clinic stating she would like to resch her loop recorder placement till the end of May, message sent to Dr Tanna Furry office ?

## 2021-09-12 NOTE — Telephone Encounter (Signed)
Pt has questions regarding appt for tomorrow. Pt states that she wants to know of any pay that might be due at the time of appt if any at all, as  well as a few other questions. Please advise ?

## 2021-09-13 ENCOUNTER — Institutional Professional Consult (permissible substitution): Payer: Self-pay | Admitting: Internal Medicine

## 2021-09-16 ENCOUNTER — Other Ambulatory Visit (HOSPITAL_COMMUNITY): Payer: Self-pay | Admitting: Internal Medicine

## 2021-09-16 ENCOUNTER — Other Ambulatory Visit (HOSPITAL_COMMUNITY): Payer: Self-pay | Admitting: *Deleted

## 2021-09-16 ENCOUNTER — Other Ambulatory Visit (HOSPITAL_COMMUNITY): Payer: Self-pay

## 2021-09-16 MED ORDER — DAPAGLIFLOZIN PROPANEDIOL 10 MG PO TABS
10.0000 mg | ORAL_TABLET | Freq: Every day | ORAL | 11 refills | Status: DC
  Filled 2021-09-16: qty 30, 30d supply, fill #0

## 2021-09-16 MED ORDER — DAPAGLIFLOZIN PROPANEDIOL 10 MG PO TABS
10.0000 mg | ORAL_TABLET | Freq: Every day | ORAL | 3 refills | Status: DC
  Filled 2021-09-16: qty 90, 90d supply, fill #0
  Filled 2021-12-05: qty 90, 90d supply, fill #1
  Filled 2022-03-05: qty 90, 90d supply, fill #2
  Filled 2022-06-08: qty 90, 90d supply, fill #3

## 2021-09-18 NOTE — Telephone Encounter (Signed)
Patient approved from 09/02/21 -- 09/17/22 ?

## 2021-09-23 ENCOUNTER — Other Ambulatory Visit (HOSPITAL_COMMUNITY): Payer: Self-pay

## 2021-09-23 ENCOUNTER — Other Ambulatory Visit (HOSPITAL_COMMUNITY): Payer: Self-pay | Admitting: Family Medicine

## 2021-09-24 ENCOUNTER — Other Ambulatory Visit (HOSPITAL_COMMUNITY): Payer: Self-pay

## 2021-09-24 MED ORDER — LOSARTAN POTASSIUM 25 MG PO TABS
12.5000 mg | ORAL_TABLET | Freq: Every evening | ORAL | 0 refills | Status: DC
Start: 2021-09-24 — End: 2021-11-21
  Filled 2021-09-24 – 2021-09-25 (×2): qty 30, 60d supply, fill #0

## 2021-09-25 ENCOUNTER — Other Ambulatory Visit (HOSPITAL_COMMUNITY): Payer: Self-pay

## 2021-09-25 NOTE — Telephone Encounter (Signed)
Left message for Pt.  Advised to call back if she had any additional questions. ?

## 2021-09-27 ENCOUNTER — Other Ambulatory Visit (HOSPITAL_COMMUNITY): Payer: Self-pay

## 2021-10-21 ENCOUNTER — Other Ambulatory Visit (HOSPITAL_COMMUNITY): Payer: Self-pay

## 2021-10-28 ENCOUNTER — Ambulatory Visit: Payer: Commercial Managed Care - HMO | Admitting: Internal Medicine

## 2021-10-28 ENCOUNTER — Encounter: Payer: Self-pay | Admitting: Internal Medicine

## 2021-10-28 ENCOUNTER — Encounter: Payer: Self-pay | Admitting: *Deleted

## 2021-10-28 VITALS — BP 100/64 | HR 83 | Ht 68.0 in | Wt 191.0 lb

## 2021-10-28 DIAGNOSIS — Z01818 Encounter for other preprocedural examination: Secondary | ICD-10-CM | POA: Diagnosis not present

## 2021-10-28 DIAGNOSIS — R55 Syncope and collapse: Secondary | ICD-10-CM | POA: Diagnosis not present

## 2021-10-28 DIAGNOSIS — I5022 Chronic systolic (congestive) heart failure: Secondary | ICD-10-CM

## 2021-10-28 NOTE — H&P (View-Only) (Signed)
HPI Shelly [name] is referred today by Dr. Debara Pickett Bensimhon for ongoing evaluation of syncope and chronic systolic heart failure. She is a pleasant 51 yo woman with CAD, with likely out of hospital MI's, chronic systolic heart failure and syncope. Her EF has been up and down but most recently 20-25%. Cardiac monitoring demonstrated NSVT. The patient notes class 2 symptoms. She does not have angina. 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 Allergies  Allergen Reactions   Atorvastatin     diarrhea   Rosuvastatin     diarrhea     Current Outpatient Medications  Medication Sig Dispense Refill   acetaminophen (TYLENOL) 500 MG tablet Take 1,000 mg by mouth every 6 (six) hours as needed for moderate pain or headache.     aspirin 81 MG chewable tablet Chew 81 mg by mouth at bedtime.     Aspirin-Caffeine (BAYER BACK & BODY PO) Take 2 tablets by mouth every 6 (six) hours as needed (pain).     carvedilol (COREG) 3.125 MG tablet Take 1 tablet (3.125 mg total) by mouth 2 (two) times daily. 180 tablet 2   cholecalciferol (VITAMIN D) 25 MCG (1000 UNIT) tablet Take 1,000 Units by mouth every evening.     Continuous Blood Gluc Receiver (FREESTYLE LIBRE 2 READER) DEVI Use as directed 1 each 0   Continuous Blood Gluc Sensor (FREESTYLE LIBRE 2 SENSOR) MISC Use as directed 1 each 3   dapagliflozin propanediol (FARXIGA) 10 MG TABS tablet Take 1 tablet (10 mg total) by mouth daily. 90 tablet 3   Dulaglutide (TRULICITY) 3 0000000 SOPN Use as directed weekly 6 mL 0   Evolocumab (REPATHA SURECLICK) XX123456 MG/ML SOAJ Inject 1 Dose into the skin every 14 (fourteen) days. 2 mL 11   furosemide (LASIX) 20 MG tablet Take 1 tablet (20 mg total) by mouth 3 (three) times a week. 30 tablet 3   ibuprofen (ADVIL) 200 MG tablet Take 400 mg by mouth every 6 (six) hours as needed for headache or moderate pain.     KLOR-CON M10 10 MEQ tablet TAKE 2 TABLETS BY MOUTH DAILY 180 tablet 3   losartan (COZAAR) 25 MG tablet Take 0.5 tablets  (12.5 mg total) by mouth at bedtime. 30 tablet 0   metFORMIN (GLUCOPHAGE) 1000 MG tablet Take 1,000 mg by mouth daily in the afternoon.     Phenylephrine-Acetaminophen (SINUS CONGESTION/PAIN PO) Take 2 tablets by mouth every 6 (six) hours as needed (sinus).     spironolactone (ALDACTONE) 25 MG tablet Take 1 tablet (25 mg total) by mouth daily. 90 tablet 3   TRULICITY 3 0000000 SOPN Inject 3 mg into the skin every Monday.     No current facility-administered medications for this visit.     Past Medical History:  Diagnosis Date   Anemia    Anxiety    Arthritis    Cataract    Mixed OU   CHF (congestive heart failure) (HCC)    Coronary artery disease    Diabetes mellitus without complication (HCC)    type 2   Diabetic retinopathy (HCC)    PDR OU   Dizziness    Headache    Cluster headaches in the past   History of kidney stones    "2 sitting"   Hyperlipidemia    Hypertension    Hypertensive retinopathy    OU   Myocardial infarction ([place])    Pneumonia    Syncope    Tobacco abuse     ROS:  All systems reviewed and negative except as noted in the HPI.   Past Surgical History:  Procedure Laterality Date   EYE SURGERY Left 08/23/2020   PPV - Dr. Brian Zamora   INJECTION OF SILICONE OIL Left 08/23/2020   Procedure: INJECTION OF SILICONE OIL;  Surgeon: Zamora, Brian, MD;  Location: [facility] OR;  Service: Ophthalmology;  Laterality: Left;   INJECTION OF SILICONE OIL Right 10/04/2020   Procedure: INJECTION OF SILICONE OIL;  Surgeon: Zamora, Brian, MD;  Location: [facility] OR;  Service: Ophthalmology;  Laterality: Right;   MEMBRANE PEEL Left 08/23/2020   Procedure: MEMBRANE PEEL;  Surgeon: Zamora, Brian, MD;  Location: [facility] OR;  Service: Ophthalmology;  Laterality: Left;   MEMBRANE PEEL Right 10/04/2020   Procedure: MEMBRANE PEEL;  Surgeon: Zamora, Brian, MD;  Location: [facility] OR;  Service: Ophthalmology;  Laterality: Right;   PARS PLANA VITRECTOMY Left 08/23/2020   Procedure: PARS PLANA  VITRECTOMY WITH 25 GAUGE WITH ENDOLASER;  Surgeon: Zamora, Brian, MD;  Location: [facility] OR;  Service: Ophthalmology;  Laterality: Left;   PARS PLANA VITRECTOMY Right 10/04/2020   Procedure: PARS PLANA VITRECTOMY WITH 25 GAUGE, INJECTION OF AVASTIN;  Surgeon: Zamora, Brian, MD;  Location: [facility] OR;  Service: Ophthalmology;  Laterality: Right;   PHOTOCOAGULATION WITH LASER Right 10/04/2020   Procedure: PHOTOCOAGULATION WITH LASER;  Surgeon: Zamora, Brian, MD;  Location: [facility] OR;  Service: Ophthalmology;  Laterality: Right;   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/23/2020   Procedure: REPAIR OF COMPLEX TRACTION RETINAL DETACHMENT;  Surgeon: Zamora, Brian, MD;  Location: [facility] OR;  Service: Ophthalmology;  Laterality: Left;   RIGHT/LEFT HEART CATH AND CORONARY ANGIOGRAPHY N/A 10/14/2019   Procedure: RIGHT/LEFT HEART CATH AND CORONARY ANGIOGRAPHY;  Surgeon: Bensimhon, Daniel R, MD;  Location: [facility] INVASIVE CV LAB;  Service: Cardiovascular;  Laterality: N/A;     Family History  Problem Relation Age of Onset   Sudden Cardiac Death Mother    CAD Father    CAD Paternal Grandmother    CAD Paternal Grandfather      Social History   Socioeconomic History   Marital status: Married    Spouse name: Not on file   Number of children: Not on file   Years of education: Not on file   Highest education level: Not on file  Occupational History   Not on file  Tobacco Use   Smoking status: Every Day    Packs/day: 1.50    Types: Cigarettes   Smokeless tobacco: Never  Vaping Use   Vaping Use: Never used  Substance and Sexual Activity   Alcohol use: Not Currently   Drug use: Never   Sexual activity: Not on file  Other Topics Concern   Not on file  Social History Narrative   Not on file   Social Determinants of Health   Financial Resource Strain: Not on file  Food Insecurity: Not on file  Transportation Needs: Not on file  Physical Activity: Not on file  Stress: Not on file  Social Connections:  Not on file  Intimate Partner Violence: Not on file     BP 100/64   Pulse 83   Ht 5' 8" (1.727 m)   Wt 191 lb (86.6 kg)   LMP  (LMP Unknown)   SpO2 98%   BMI 29.04 kg/m   Physical Exam:  Well appearing NAD HEENT: Unremarkable Neck:  No JVD, no thyromegally Lymphatics:  No adenopathy Back:  No CVA tenderness Lungs:  Clear with no wheezes HEART:    Regular rate rhythm, no murmurs, no rubs, no clicks Abd:  soft, positive bowel sounds, no organomegally, no rebound, no guarding Ext:  2 plus pulses, no edema, no cyanosis, no clubbing Skin:  No rashes no nodules Neuro:  CN II through XII intact, motor grossly intact    Assess/Plan:  ICM - the patient has evidence on MRI of prior MI, and her EF is now 20-25%. I have discussed the treatment options with the patient. ICD insertion is a class 1 indication. The risks/benefits/goals/expectations of device insertion were reviewed and she wishes to proceed.  Chronic systolic heart failure - her symptoms are class 2. She will continue GDMT.  Syncope - etiology is unclear but with prior ICM, EF 20%, ICD insertion rather than ILR is indicated.  4. Tobacco abuse - she has been encouraged to stop smoking.  Carleene Overlie Tomara Youngberg,MD

## 2021-10-28 NOTE — Progress Notes (Signed)
HPI Shelly [name] is referred today by Dr. Debara Pickett Bensimhon for ongoing evaluation of syncope and chronic systolic heart failure. She is a pleasant 51 yo woman with CAD, with likely out of hospital MI's, chronic systolic heart failure and syncope. Her EF has been up and down but most recently 20-25%. Cardiac monitoring demonstrated NSVT. The patient notes class 2 symptoms. She does not have angina. 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 Allergies  Allergen Reactions   Atorvastatin     diarrhea   Rosuvastatin     diarrhea     Current Outpatient Medications  Medication Sig Dispense Refill   acetaminophen (TYLENOL) 500 MG tablet Take 1,000 mg by mouth every 6 (six) hours as needed for moderate pain or headache.     aspirin 81 MG chewable tablet Chew 81 mg by mouth at bedtime.     Aspirin-Caffeine (BAYER BACK & BODY PO) Take 2 tablets by mouth every 6 (six) hours as needed (pain).     carvedilol (COREG) 3.125 MG tablet Take 1 tablet (3.125 mg total) by mouth 2 (two) times daily. 180 tablet 2   cholecalciferol (VITAMIN D) 25 MCG (1000 UNIT) tablet Take 1,000 Units by mouth every evening.     Continuous Blood Gluc Receiver (FREESTYLE LIBRE 2 READER) DEVI Use as directed 1 each 0   Continuous Blood Gluc Sensor (FREESTYLE LIBRE 2 SENSOR) MISC Use as directed 1 each 3   dapagliflozin propanediol (FARXIGA) 10 MG TABS tablet Take 1 tablet (10 mg total) by mouth daily. 90 tablet 3   Dulaglutide (TRULICITY) 3 0000000 SOPN Use as directed weekly 6 mL 0   Evolocumab (REPATHA SURECLICK) XX123456 MG/ML SOAJ Inject 1 Dose into the skin every 14 (fourteen) days. 2 mL 11   furosemide (LASIX) 20 MG tablet Take 1 tablet (20 mg total) by mouth 3 (three) times a week. 30 tablet 3   ibuprofen (ADVIL) 200 MG tablet Take 400 mg by mouth every 6 (six) hours as needed for headache or moderate pain.     KLOR-CON M10 10 MEQ tablet TAKE 2 TABLETS BY MOUTH DAILY 180 tablet 3   losartan (COZAAR) 25 MG tablet Take 0.5 tablets  (12.5 mg total) by mouth at bedtime. 30 tablet 0   metFORMIN (GLUCOPHAGE) 1000 MG tablet Take 1,000 mg by mouth daily in the afternoon.     Phenylephrine-Acetaminophen (SINUS CONGESTION/PAIN PO) Take 2 tablets by mouth every 6 (six) hours as needed (sinus).     spironolactone (ALDACTONE) 25 MG tablet Take 1 tablet (25 mg total) by mouth daily. 90 tablet 3   TRULICITY 3 0000000 SOPN Inject 3 mg into the skin every Monday.     No current facility-administered medications for this visit.     Past Medical History:  Diagnosis Date   Anemia    Anxiety    Arthritis    Cataract    Mixed OU   CHF (congestive heart failure) (HCC)    Coronary artery disease    Diabetes mellitus without complication (HCC)    type 2   Diabetic retinopathy (HCC)    PDR OU   Dizziness    Headache    Cluster headaches in the past   History of kidney stones    "2 sitting"   Hyperlipidemia    Hypertension    Hypertensive retinopathy    OU   Myocardial infarction ([place])    Pneumonia    Syncope    Tobacco abuse     ROS:  All systems reviewed and negative except as noted in the HPI.   Past Surgical History:  Procedure Laterality Date   EYE SURGERY Left 08/23/2020   PPV - Dr. [name]   INJECTION OF SILICONE OIL Left 08/23/2020   Procedure: INJECTION OF SILICONE OIL;  Surgeon: [name], MD;  Location: [facility] OR;  Service: Ophthalmology;  Laterality: Left;   INJECTION OF SILICONE OIL Right 10/04/2020   Procedure: INJECTION OF SILICONE OIL;  Surgeon: [name], MD;  Location: [facility] OR;  Service: Ophthalmology;  Laterality: Right;   MEMBRANE PEEL Left 08/23/2020   Procedure: MEMBRANE PEEL;  Surgeon: [name], MD;  Location: [facility] OR;  Service: Ophthalmology;  Laterality: Left;   MEMBRANE PEEL Right 10/04/2020   Procedure: MEMBRANE PEEL;  Surgeon: [name], MD;  Location: [facility] OR;  Service: Ophthalmology;  Laterality: Right;   PARS PLANA VITRECTOMY Left 08/23/2020   Procedure: PARS PLANA  VITRECTOMY WITH 25 GAUGE WITH ENDOLASER;  Surgeon: [name], MD;  Location: [facility] OR;  Service: Ophthalmology;  Laterality: Left;   PARS PLANA VITRECTOMY Right 10/04/2020   Procedure: PARS PLANA VITRECTOMY WITH 25 GAUGE, INJECTION OF AVASTIN;  Surgeon: [name], MD;  Location: [facility] OR;  Service: Ophthalmology;  Laterality: Right;   PHOTOCOAGULATION WITH LASER Right 10/04/2020   Procedure: PHOTOCOAGULATION WITH LASER;  Surgeon: [name], MD;  Location: [facility] OR;  Service: Ophthalmology;  Laterality: Right;   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/23/2020   Procedure: REPAIR OF COMPLEX TRACTION RETINAL DETACHMENT;  Surgeon: [name], MD;  Location: [facility] OR;  Service: Ophthalmology;  Laterality: Left;   RIGHT/LEFT HEART CATH AND CORONARY ANGIOGRAPHY N/A 10/14/2019   Procedure: RIGHT/LEFT HEART CATH AND CORONARY ANGIOGRAPHY;  Surgeon: Dolores Patty, MD;  Location: [facility] INVASIVE CV LAB;  Service: Cardiovascular;  Laterality: N/A;     Family History  Problem Relation Age of Onset   Sudden Cardiac Death Mother    CAD Father    CAD Paternal Grandmother    CAD Paternal Grandfather      Social History   Socioeconomic History   Marital status: Married    Spouse name: Not on file   Number of children: Not on file   Years of education: Not on file   Highest education level: Not on file  Occupational History   Not on file  Tobacco Use   Smoking status: Every Day    Packs/day: 1.50    Types: Cigarettes   Smokeless tobacco: Never  Vaping Use   Vaping Use: Never used  Substance and Sexual Activity   Alcohol use: Not Currently   Drug use: Never   Sexual activity: Not on file  Other Topics Concern   Not on file  Social History Narrative   Not on file   Social Determinants of Health   Financial Resource Strain: Not on file  Food Insecurity: Not on file  Transportation Needs: Not on file  Physical Activity: Not on file  Stress: Not on file  Social Connections:  Not on file  Intimate Partner Violence: Not on file     BP 100/64   Pulse 83   Ht 5\' 8"  (1.727 m)   Wt 191 lb (86.6 kg)   LMP  (LMP Unknown)   SpO2 98%   BMI 29.04 kg/m   Physical Exam:  Well appearing NAD HEENT: Unremarkable Neck:  No JVD, no thyromegally Lymphatics:  No adenopathy Back:  No CVA tenderness Lungs:  Clear with no wheezes HEART:  Regular rate rhythm, no murmurs, no rubs, no clicks Abd:  soft, positive bowel sounds, no organomegally, no rebound, no guarding Ext:  2 plus pulses, no edema, no cyanosis, no clubbing Skin:  No rashes no nodules Neuro:  CN II through XII intact, motor grossly intact    Assess/Plan:  ICM - the patient has evidence on MRI of prior MI, and her EF is now 20-25%. I have discussed the treatment options with the patient. ICD insertion is a class 1 indication. The risks/benefits/goals/expectations of device insertion were reviewed and she wishes to proceed.  Chronic systolic heart failure - her symptoms are class 2. She will continue GDMT.  Syncope - etiology is unclear but with prior ICM, EF 20%, ICD insertion rather than ILR is indicated.  4. Tobacco abuse - she has been encouraged to stop smoking.  Shelly Overlie Reba Hulett,MD

## 2021-10-28 NOTE — Patient Instructions (Addendum)
Medication Instructions:  Your physician recommends that you continue on your current medications as directed. Please refer to the Current Medication list given to you today. *If you need a refill on your cardiac medications before your next appointment, please call your pharmacy*  Lab Work: June 1st If you have labs (blood work) drawn today and your tests are completely normal, you will receive your results only by: MyChart Message (if you have MyChart) OR A paper copy in the mail If you have any lab test that is abnormal or we need to change your treatment, we will call you to review the results.  Testing/Procedures: Your physician has recommended that you have a defibrillator inserted. An implantable cardioverter defibrillator (ICD) is a small device that is placed in your chest or, in rare cases, your abdomen. This device uses electrical pulses or shocks to help control life-threatening, irregular heartbeats that could lead the heart to suddenly stop beating (sudden cardiac arrest). Leads are attached to the ICD that goes into your heart. This is done in the hospital and usually requires an overnight stay. Please see the instruction sheet given to you today for more information.   Follow-Up: At [facility], you and your health needs are our priority.  As part of our continuing mission to provide you with exceptional heart care, we have created designated Provider Care Teams.  These Care Teams include your primary Cardiologist (physician) and Advanced Practice Providers (APPs -  Physician Assistants and Nurse Practitioners) who all work together to provide you with the care you need, when you need it.  Your physician wants you to follow-up in: see instruction letter.  We recommend signing up for the patient portal called "MyChart".  Sign up information is provided on this After Visit Summary.  MyChart is used to connect with patients for Virtual Visits (Telemedicine).  Patients are able to  view lab/test results, encounter notes, upcoming appointments, etc.  Non-urgent messages can be sent to your provider as well.   To learn more about what you can do with MyChart, go to [email or web address].    Any Other Special Instructions Will Be Listed Below (If Applicable).  Cardioverter Defibrillator Implantation An implantable cardioverter defibrillator (ICD) is a device that identifies and corrects abnormal heart rhythms. Cardioverter defibrillator implantation is a surgery to place an ICD under the skin in the chest or abdomen. An ICD has a battery, a small computer (pulse generator), and wires (leads) that go into the heart. The ICD detects and corrects two types of dangerous irregular heart rhythms (arrhythmias): A rapid heart rhythm in the lower chambers of the heart (ventricles). This is called ventricular tachycardia. The ventricles contracting in an uncoordinated way. This is called ventricular fibrillation. There are different types of ICDs, and the electrical signals from the ICD can be programmed differently based on the condition being treated. The electrical signals from the ICD can be low-energy pulses, high-energy shocks, or a combination of the two. The low-energy pulses are generally used to restore the heartbeat to normal when it is either too slow (bradycardia) or too fast. These pulses are painless. The high-energy shocks are used to treat abnormal rhythms such as ventricular tachycardia or ventricular fibrillation. This shock may feel like a strong jolt in the chest. Your health care provider may recommend an ICD if you have: Had a ventricular arrhythmia in the past. A damaged heart because of a disease or heart condition. A weakened heart muscle from a heart attack or cardiac arrest. A  congenital heart defect. Long QT syndrome, which is a disorder of the heart's electrical system. Brugada syndrome, which is a condition that causes a disruption of the heart's normal  rhythm. Tell a health care provider about: Any allergies you have. All medicines you are taking, including vitamins, herbs, eye drops, creams, and over-the-counter medicines. Any problems you or family members have had with anesthetic medicines. Any blood disorders you have. Any surgeries you have had. Any medical conditions you have. Whether you are pregnant or may be pregnant. What are the risks? Generally, this is a safe procedure. However, problems may occur, including: Infection. Bleeding. Allergic reactions to medicines used during the procedure. Blood clots. Swelling or bruising. Damage to nearby structures or organs, such as nerves, lungs, blood vessels, or the heart where the ICD leads or pulse generator is implanted. What happens before the procedure? Staying hydrated Follow instructions from your health care provider about hydration, which may include: Up to 2 hours before the procedure - you may continue to drink clear liquids, such as water, clear fruit juice, black coffee, and plain tea.  Eating and drinking restrictions Follow instructions from your health care provider about eating and drinking, which may include: 8 hours before the procedure - stop eating heavy meals or foods, such as meat, fried foods, or fatty foods. 6 hours before the procedure - stop eating light meals or foods, such as toast or cereal. 6 hours before the procedure - stop drinking milk or drinks that contain milk. 2 hours before the procedure - stop drinking clear liquids. Medicines Ask your health care provider about: Changing or stopping your regular medicines. This is especially important if you are taking diabetes medicines or blood thinners. Taking medicines such as aspirin and ibuprofen. These medicines can thin your blood. Do not take these medicines unless your health care provider tells you to take them. Taking over-the-counter medicines, vitamins, herbs, and supplements. Tests You may  have an exam or testing. These may include: Blood tests. A test to check the electrical signals in your heart (electrocardiogram, ECG). Imaging tests, such as a chest X-ray. Echocardiogram. This is an ultrasound of your heart to evaluate your heart structures and function. An event monitor or Holter monitor to wear at home. General instructions Do not use any products that contain nicotine or tobacco for at least 4 weeks before the procedure. These products include cigarettes, chewing tobacco, and vaping devices, such as e-cigarettes. If you need help quitting, ask your health care provider. Ask your health care provider: How your procedure site will be marked. What steps will be taken to help prevent infection. These may include: Removing hair at the surgery site. Washing skin with a germ-killing soap. Taking antibiotic medicine. You may be asked to shower with a germ-killing soap. Plan to have a responsible adult take you home from the hospital or clinic. What happens during the procedure?  Small monitors will be put on your body. They will be used to check your heart rate, blood pressure, and oxygen level. A pair of sticky pads (defibrillator pads) may be placed on your back and chest. These pads are able to pace your heart as needed during the procedure. An IV will be inserted into one of your veins. You will be given one or more of the following: A medicine to help you relax (sedative). A medicine to numb the area (local anesthetic). A medicine to make you fall asleep(general anesthetic). A small incision will be made to create a  deep pocket under the skin of your chest or abdomen. Leads will be guided through a blood vessel into your heart and attached to your heart muscles. Depending on the ICD, the leads may go into one ventricle, or they may go into both ventricles and into an upper chamber of the heart. An X-ray machine (fluoroscope) will be used to help guide the leads. The other  end of the leads will be attached to the pulse generator. The pulse generator will be placed into the pocket under the skin. The ICD will be tested, and your health care provider will program the ICD for the condition being treated. The incision will be closed with stitches (sutures), skin glue, adhesive strips, or staples. A bandage (dressing) will be placed over the incision. The procedure may vary among health care providers and hospitals. What happens after the procedure? Your blood pressure, heart rate, breathing rate, and blood oxygen level will be monitored until you leave the hospital or clinic. Your health care provider will also monitor your ICD to make sure it is working properly. A chest X-ray will be taken to check that the ICD is in the right place. Do not raise the arm on the side of your procedure higher than your shoulder for as long as told by your health care provider. This is usually at least 6 weeks. You may be given an identification card explaining that you have an ICD. You will be given a remote home monitoring device to use with your ICD to allow your device to communicate with your clinic. Summary An implantable cardioverter defibrillator (ICD) is a device that identifies and corrects abnormal heart rhythms. Cardioverter defibrillator implantation is a surgery to place an ICD under the skin in the chest or abdomen. An ICD consists of a battery, a small computer (pulse generator), and wires (leads) that go into the heart. During the procedure, the ICD will be tested, and your health care provider will program the ICD for the condition being treated. After the procedure, a chest X-ray will be taken to check that the ICD is in the right place. This information is not intended to replace advice given to you by your health care provider. Make sure you discuss any questions you have with your health care provider. Document Revised: 11/23/2019 Document Reviewed:  11/23/2019 Elsevier Patient Education  2023 [employer].

## 2021-11-07 ENCOUNTER — Other Ambulatory Visit: Payer: Commercial Managed Care - HMO | Admitting: *Deleted

## 2021-11-07 DIAGNOSIS — R55 Syncope and collapse: Secondary | ICD-10-CM

## 2021-11-07 DIAGNOSIS — Z01818 Encounter for other preprocedural examination: Secondary | ICD-10-CM

## 2021-11-07 DIAGNOSIS — I5022 Chronic systolic (congestive) heart failure: Secondary | ICD-10-CM

## 2021-11-07 LAB — BASIC METABOLIC PANEL
BUN/Creatinine Ratio: 16 (ref 9–23)
BUN: 17 mg/dL (ref 6–24)
CO2: 28 mmol/L (ref 20–29)
Calcium: 10.2 mg/dL (ref 8.7–10.2)
Chloride: 99 mmol/L (ref 96–106)
Creatinine, Ser: 1.08 mg/dL — ABNORMAL HIGH (ref 0.57–1.00)
Glucose: 133 mg/dL — ABNORMAL HIGH (ref 70–99)
Potassium: 4.8 mmol/L (ref 3.5–5.2)
Sodium: 139 mmol/L (ref 134–144)
eGFR: 62 mL/min/{1.73_m2} (ref >59)

## 2021-11-07 LAB — CBC WITH DIFFERENTIAL/PLATELET
Basophils Absolute: 0 10*3/uL (ref 0.0–0.2)
Basos: 0 %
EOS (ABSOLUTE): 0.6 10*3/uL — ABNORMAL HIGH (ref 0.0–0.4)
Eos: 5 %
Hematocrit: 43.3 % (ref 34.0–46.6)
Hemoglobin: 14.5 g/dL (ref 11.1–15.9)
Lymphocytes Absolute: 2.9 10*3/uL (ref 0.7–3.1)
Lymphs: 25 %
MCH: 29 pg (ref 26.6–33.0)
MCHC: 33.5 g/dL (ref 31.5–35.7)
MCV: 87 fL (ref 79–97)
Monocytes Absolute: 0.7 10*3/uL (ref 0.1–0.9)
Monocytes: 6 %
Neutrophils Absolute: 7.3 10*3/uL — ABNORMAL HIGH (ref 1.4–7.0)
Neutrophils: 64 %
Platelets: 276 10*3/uL (ref 150–450)
RBC: 5 x10E6/uL (ref 3.77–5.28)
RDW: 13.7 % (ref 11.7–15.4)
WBC: 11.5 10*3/uL — ABNORMAL HIGH (ref 3.4–10.8)

## 2021-11-20 NOTE — Pre-Procedure Instructions (Signed)
Instructed patient on the following items: Arrival time 0930 Nothing to eat or drink after midnight No meds AM of procedure Responsible person to drive you home and stay with you for 24 hrs Wash with special soap night before and morning of procedure  

## 2021-11-21 ENCOUNTER — Ambulatory Visit (HOSPITAL_COMMUNITY)
Admission: RE | Admit: 2021-11-21 | Discharge: 2021-11-21 | Disposition: A | Discharge status: Home / Self Care | Payer: Commercial Managed Care - HMO | Attending: Internal Medicine | Admitting: Internal Medicine

## 2021-11-21 ENCOUNTER — Other Ambulatory Visit (HOSPITAL_COMMUNITY): Payer: Self-pay | Admitting: Family Medicine

## 2021-11-21 ENCOUNTER — Encounter (HOSPITAL_COMMUNITY)
Admission: RE | Disposition: A | Discharge status: Home / Self Care | Payer: Self-pay | Source: Home / Self Care | Attending: Internal Medicine

## 2021-11-21 ENCOUNTER — Ambulatory Visit (HOSPITAL_COMMUNITY): Payer: Commercial Managed Care - HMO

## 2021-11-21 ENCOUNTER — Other Ambulatory Visit (HOSPITAL_COMMUNITY): Payer: Self-pay

## 2021-11-21 ENCOUNTER — Other Ambulatory Visit: Payer: Self-pay

## 2021-11-21 DIAGNOSIS — I472 Ventricular tachycardia, unspecified: Secondary | ICD-10-CM | POA: Insufficient documentation

## 2021-11-21 DIAGNOSIS — I255 Ischemic cardiomyopathy: Secondary | ICD-10-CM | POA: Insufficient documentation

## 2021-11-21 DIAGNOSIS — Z7985 Long-term (current) use of injectable non-insulin antidiabetic drugs: Secondary | ICD-10-CM | POA: Insufficient documentation

## 2021-11-21 DIAGNOSIS — F17210 Nicotine dependence, cigarettes, uncomplicated: Secondary | ICD-10-CM | POA: Insufficient documentation

## 2021-11-21 DIAGNOSIS — R55 Syncope and collapse: Secondary | ICD-10-CM | POA: Insufficient documentation

## 2021-11-21 DIAGNOSIS — I2510 Atherosclerotic heart disease of native coronary artery without angina pectoris: Secondary | ICD-10-CM | POA: Insufficient documentation

## 2021-11-21 DIAGNOSIS — Z7984 Long term (current) use of oral hypoglycemic drugs: Secondary | ICD-10-CM | POA: Insufficient documentation

## 2021-11-21 DIAGNOSIS — I110 Hypertensive heart disease with heart failure: Secondary | ICD-10-CM | POA: Diagnosis not present

## 2021-11-21 DIAGNOSIS — E119 Type 2 diabetes mellitus without complications: Secondary | ICD-10-CM | POA: Diagnosis not present

## 2021-11-21 DIAGNOSIS — I5022 Chronic systolic (congestive) heart failure: Secondary | ICD-10-CM | POA: Insufficient documentation

## 2021-11-21 DIAGNOSIS — I252 Old myocardial infarction: Secondary | ICD-10-CM | POA: Diagnosis not present

## 2021-11-21 HISTORY — PX: ICD IMPLANT: EP1208

## 2021-11-21 LAB — GLUCOSE, CAPILLARY: Glucose-Capillary: 128 mg/dL — ABNORMAL HIGH (ref 70–99)

## 2021-11-21 SURGERY — ICD IMPLANT

## 2021-11-21 MED ORDER — ACETAMINOPHEN 325 MG PO TABS
325.0000 mg | ORAL_TABLET | ORAL | Status: DC | PRN
  Administered 2021-11-21: 650 mg via ORAL
  Filled 2021-11-21: qty 2

## 2021-11-21 MED ORDER — MIDAZOLAM HCL 5 MG/5ML IJ SOLN
INTRAMUSCULAR | Status: AC
  Filled 2021-11-21: qty 5

## 2021-11-21 MED ORDER — POVIDONE-IODINE 10 % EX SWAB: 2.0000 "application " | Freq: Once | CUTANEOUS | Status: DC

## 2021-11-21 MED ORDER — HEPARIN (PORCINE) IN NACL 1000-0.9 UT/500ML-% IV SOLN
INTRAVENOUS | Status: DC | PRN
  Administered 2021-11-21: 500 mL

## 2021-11-21 MED ORDER — SODIUM CHLORIDE 0.9 % IV SOLN
80.0000 mg | INTRAVENOUS | Status: AC
  Administered 2021-11-21: 80 mg

## 2021-11-21 MED ORDER — SODIUM CHLORIDE 0.9 % IV SOLN: INTRAVENOUS | Status: DC

## 2021-11-21 MED ORDER — ONDANSETRON HCL 4 MG/2ML IJ SOLN: 4.0000 mg | Freq: Four times a day (QID) | INTRAMUSCULAR | Status: DC | PRN

## 2021-11-21 MED ORDER — FENTANYL CITRATE (PF) 100 MCG/2ML IJ SOLN
INTRAMUSCULAR | Status: AC
  Filled 2021-11-21: qty 2

## 2021-11-21 MED ORDER — LOSARTAN POTASSIUM 25 MG PO TABS
12.5000 mg | ORAL_TABLET | Freq: Every evening | ORAL | 5 refills | Status: DC
Start: 2021-11-21 — End: 2022-01-30
  Filled 2021-11-21: qty 30, 60d supply, fill #0

## 2021-11-21 MED ORDER — CEFAZOLIN SODIUM-DEXTROSE 1-4 GM/50ML-% IV SOLN
1.0000 g | Freq: Once | INTRAVENOUS | Status: AC
  Administered 2021-11-21: 1 g via INTRAVENOUS

## 2021-11-21 MED ORDER — LIDOCAINE HCL (PF) 1 % IJ SOLN
INTRAMUSCULAR | Status: AC
  Filled 2021-11-21: qty 60

## 2021-11-21 MED ORDER — MIDAZOLAM HCL 5 MG/5ML IJ SOLN
INTRAMUSCULAR | Status: DC | PRN
  Administered 2021-11-21 (×4): 1 mg via INTRAVENOUS

## 2021-11-21 MED ORDER — LIDOCAINE HCL (PF) 1 % IJ SOLN
INTRAMUSCULAR | Status: DC | PRN
  Administered 2021-11-21: 55 mL

## 2021-11-21 MED ORDER — CEFAZOLIN SODIUM-DEXTROSE 2-4 GM/100ML-% IV SOLN
2.0000 g | INTRAVENOUS | Status: AC
  Administered 2021-11-21: 2 g via INTRAVENOUS

## 2021-11-21 MED ORDER — SODIUM CHLORIDE 0.9 % IV SOLN
INTRAVENOUS | Status: AC
  Filled 2021-11-21: qty 2

## 2021-11-21 MED ORDER — FENTANYL CITRATE (PF) 100 MCG/2ML IJ SOLN
INTRAMUSCULAR | Status: DC | PRN
  Administered 2021-11-21 (×2): 12.5 ug via INTRAVENOUS
  Administered 2021-11-21: 25 ug via INTRAVENOUS

## 2021-11-21 MED ORDER — HEPARIN (PORCINE) IN NACL 1000-0.9 UT/500ML-% IV SOLN
INTRAVENOUS | Status: AC
  Filled 2021-11-21: qty 500

## 2021-11-21 MED ORDER — CEFAZOLIN SODIUM-DEXTROSE 2-4 GM/100ML-% IV SOLN
INTRAVENOUS | Status: AC
  Filled 2021-11-21: qty 100

## 2021-11-21 MED ORDER — CHLORHEXIDINE GLUCONATE 4 % EX LIQD: 4.0000 "application " | Freq: Once | CUTANEOUS | Status: DC

## 2021-11-21 SURGICAL SUPPLY — 6 items
CABLE SURGICAL S-101-97-12 (CABLE) ×2 IMPLANT
ICD MOMENTUM D120 (ICD Generator) ×1 IMPLANT
LEAD RELIANCE 0137-59 (Lead) ×1 IMPLANT
PAD DEFIB RADIO PHYSIO CONN (PAD) ×2 IMPLANT
SHEATH 9FR PRELUDE SNAP 13 (SHEATH) ×1 IMPLANT
TRAY PACEMAKER INSERTION (PACKS) ×2 IMPLANT

## 2021-11-21 NOTE — Interval H&P Note (Signed)
History and Physical Interval Note:  11/21/2021 11:14 AM  Shelly [name]  has presented today for surgery, with the diagnosis of cardiomyopathy.  The various methods of treatment have been discussed with the patient and family. After consideration of risks, benefits and other options for treatment, the patient has consented to  Procedure(s): ICD IMPLANT (N/A) as a surgical intervention.  The patient's history has been reviewed, patient examined, no change in status, stable for surgery.  I have reviewed the patient's chart and labs.  Questions were answered to the patient's satisfaction.     Lewayne Bunting

## 2021-11-21 NOTE — Discharge Instructions (Signed)
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 After 7 days, you may progress as below.  You should remove your sling 24 hours after your procedure, unless otherwise instructed by your provider.     Thursday November 28, 2021  Friday November 29, 2021 Saturday November 30, 2021 Sunday December 01, 2021   Do not lift, push, pull, or carry anything over 10 pounds with the affected arm until 6 weeks (Thursday January 02, 2022 ) after your procedure.   You may drive AFTER your wound check, unless you have been told otherwise by your provider.   Ask your healthcare provider when you can go back to work   INCISION/Dressing If you are on a blood thinner such as Coumadin, Xarelto, Eliquis, Plavix, or Pradaxa please confirm with your provider when this should be resumed  If large square, outer bandage is left in place, this can be removed after 24 hours from your procedure. Do not remove steri-strips or glue as below.   Monitor your defibrillator site for redness, swelling, and drainage. Call the device clinic at [phone] if you experience these symptoms or fever/chills.  If your incision is sealed with Steri-strips or staples, you may shower 7 days after your procedure or when told by your provider. Do not remove the steri-strips or let the shower hit directly on your site. You may wash around your site with soap and water.    If you were discharged in a sling, please do not wear this during the day more than 48 hours after your surgery unless otherwise instructed. This may increase the risk of stiffness and soreness in your shoulder.   Avoid lotions, ointments, or perfumes over your incision until it is well-healed.  You may use a hot tub or a pool AFTER your wound check appointment if the incision is completely closed.  Your ICD is designed to protect you from life threatening  heart rhythms. Because of this, you may receive a shock.   1 shock with no symptoms:  Call the office during business hours. 1 shock with symptoms (chest pain, chest pressure, dizziness, lightheadedness, shortness of breath, overall feeling unwell):  Call 911. If you experience 2 or more shocks in 24 hours:  Call 911. If you receive a shock, you should not drive for 6 months per the [place] DMV IF you receive appropriate therapy from your ICD.   ICD Alerts:  Some alerts are vibratory and others beep. These are NOT emergencies. Please call our office to let us know. If this occurs at night or on weekends, it can wait until the next business day. Send a remote transmission.  If your device is capable of reading fluid status (for heart failure), you will be offered monthly monitoring to review this with you.   DEVICE MANAGEMENT Remote monitoring is used to monitor your ICD from home. This monitoring is scheduled every 91 days by our office. It allows [place] to keep an eye on the functioning of your device to ensure it is working properly. You will routinely see your Electrophysiologist annually (more often if necessary).   You should receive your ID card for your new device in 4-8 weeks. Keep this card with you at all times once received. Consider wearing a medical alert bracelet or necklace.  Your ICD  may be MRI compatible. This will be discussed at your next  office visit/wound check.  You should avoid contact with strong electric or magnetic fields.   Do not use amateur (ham) radio equipment or electric (arc) welding torches. MP3 player headphones with magnets should not be used. Some devices are safe to use if held at least 12 inches (30 cm) from your defibrillator. These include power tools, lawn mowers, and speakers. If you are unsure if something is safe to use, ask your health care provider.  When using your cell phone, hold it to the ear that is on the opposite side from the defibrillator. Do not  leave your cell phone in a pocket over the defibrillator.  You may safely use electric blankets, heating pads, computers, and microwave ovens.  Call the office right away if: You have chest pain. You feel more than one shock. You feel more short of breath than you have felt before. You feel more light-headed than you have felt before. Your incision starts to open up.  This information is not intended to replace advice given to you by your health care provider. Make sure you discuss any questions you have with your health care provider.

## 2021-11-21 NOTE — Progress Notes (Addendum)
Called into room with Dr. Ladona Ridgel, Joni Reining relayed CXR results to Dr. Ladona Ridgel. Made aware of recommended Follow up in 3-4 wks. Per Dr. Ladona Ridgel patient was okay for discharge .  [employer] Rep stated patient looked good . Per Dr. Ladona Ridgel patient was okay to discharge home now. ([id number])

## 2021-11-22 ENCOUNTER — Telehealth: Payer: Self-pay | Admitting: Internal Medicine

## 2021-11-22 ENCOUNTER — Other Ambulatory Visit (HOSPITAL_COMMUNITY): Payer: Self-pay

## 2021-11-22 ENCOUNTER — Encounter (HOSPITAL_COMMUNITY): Payer: Self-pay | Admitting: Internal Medicine

## 2021-11-22 MED ORDER — DULAGLUTIDE 3 MG/0.5ML ~~LOC~~ SOAJ
3.0000 mg | SUBCUTANEOUS | 0 refills | Status: DC
  Filled 2021-11-22: qty 6, 84d supply, fill #0

## 2021-11-22 NOTE — Telephone Encounter (Signed)
Spoke to patient, advised she may take tylenol and use ice as needed with barrier between skin. Voiced understanding and appreciative of call.

## 2021-11-22 NOTE — Telephone Encounter (Signed)
  1. Has your device fired? no  2. Is you device beeping? no  3. Are you experiencing draining or swelling at device site? no  4. Are you calling to see if we received your device transmission? no  5. Have you passed out? no  Patient had a defib. placed yesterday, she wants to know what she can take for the pain.   Please route to Device Clinic Pool

## 2021-11-25 ENCOUNTER — Telehealth: Payer: Self-pay

## 2021-11-25 ENCOUNTER — Other Ambulatory Visit (HOSPITAL_COMMUNITY): Payer: Self-pay

## 2021-11-25 NOTE — Telephone Encounter (Signed)
-----   Message from Graciella Freer, PA-C sent at 11/21/2021  3:55 PM EDT ----- Same day ICD, GT 6/15

## 2021-11-25 NOTE — Telephone Encounter (Signed)
Follow-up after same day discharge: Implant date: 11/21/2021 MD: Lewayne Bunting, MD Device: [facility] [employer] D120 MOMENTUM EL ICD Location: Left Chest   Wound check visit: 12/04/2021  @ 8:40 am 90 day MD follow-up: 02/26/2022 @ 8:45 am  Remote Transmission received:yes  Dressing/sling removed: TBD  Confirm OAC restart on: N/A   Attempted to contact patient. No answer, LMTCB.

## 2021-11-25 NOTE — Telephone Encounter (Signed)
Attempted to return call. No answer, LMTCB. 

## 2021-11-25 NOTE — Telephone Encounter (Signed)
Pt called returning nurse call. I told her the nurse will give her a call back.

## 2021-11-26 NOTE — Telephone Encounter (Signed)
Spoke with patient she stated she has taken large clear bandage off, patient changed wound check apt to 12/05/21 due to transportation, patient voiced understanding of lifting restrictions.

## 2021-12-04 ENCOUNTER — Ambulatory Visit: Payer: Commercial Managed Care - HMO

## 2021-12-05 ENCOUNTER — Other Ambulatory Visit (HOSPITAL_COMMUNITY): Payer: Self-pay

## 2021-12-05 ENCOUNTER — Ambulatory Visit (INDEPENDENT_AMBULATORY_CARE_PROVIDER_SITE_OTHER): Payer: Commercial Managed Care - HMO

## 2021-12-05 DIAGNOSIS — I5021 Acute systolic (congestive) heart failure: Secondary | ICD-10-CM | POA: Diagnosis not present

## 2021-12-05 LAB — CUP PACEART INCLINIC DEVICE CHECK
Date Time Interrogation Session: 20230629174253
HighPow Impedance: 73 Ohm
Implantable Lead Implant Date: 20230615
Implantable Lead Location: 753860
Implantable Lead Model: 137
Implantable Lead Serial Number: 301168
Implantable Pulse Generator Implant Date: 20230615
Lead Channel Impedance Value: 602 Ohm
Lead Channel Pacing Threshold Amplitude: 0.8 V
Lead Channel Pacing Threshold Pulse Width: 0.4 ms
Lead Channel Sensing Intrinsic Amplitude: 25 mV
Lead Channel Setting Pacing Amplitude: 3.5 V
Lead Channel Setting Pacing Pulse Width: 0.4 ms
Lead Channel Setting Sensing Sensitivity: 0.5 mV
Pulse Gen Serial Number: 217479

## 2021-12-05 NOTE — Progress Notes (Signed)
Wound check appointment. Steri-strips removed prior to OV . Wound without redness or edema. Scab noted at center of incision site, scab debridement stitch visible to eye unable to clip. Instructed patient to was site BID with clean wash cloth and soap and to gently rub area, informed patient and patients husband that the stitch will either come out or site will heal. See pic in epic note for details.  Normal device function. Thresholds, sensing, and impedances consistent with implant measurements. Device programmed at 3.5V for extra safety margin until 3 month visit. Histogram distribution appropriate for patient and level of activity. No ventricular arrhythmias noted. Patient educated 12/13/21 for wound re-check of note patient lives in [place] is legally blind and relies on others for transportation.

## 2021-12-05 NOTE — Patient Instructions (Signed)
   After Your ICD (Implantable Cardiac Defibrillator)    Monitor your defibrillator site for redness, swelling, and drainage. Call the device clinic at [phone] if you experience these symptoms or fever/chills.  Wash incision site twice daily with soap and water with clean wash cloth, pat dry with clean towel    Do not lift, push or pull greater than 10 pounds with the affected arm until 6 weeks after your procedure. There are no other restrictions in arm movement after your wound check appointment.   Your ICD is designed to protect you from life threatening heart rhythms. Because of this, you may receive a shock.   1 shock with no symptoms:  Call the office during business hours. 1 shock with symptoms (chest pain, chest pressure, dizziness, lightheadedness, shortness of breath, overall feeling unwell):  Call 911. If you experience 2 or more shocks in 24 hours:  Call 911. If you receive a shock, you should not drive.  [place] DMV - no driving for 6 months if you receive appropriate therapy from your ICD.   ICD Alerts:  Some alerts are vibratory and others beep. These are NOT emergencies. Please call our office to let us know. If this occurs at night or on weekends, it can wait until the next business day. Send a remote transmission.  If your device is capable of reading fluid status (for heart failure), you will be offered monthly monitoring to review this with you.   Remote monitoring is used to monitor your ICD from home. This monitoring is scheduled every 91 days by our office. It allows [place] to keep an eye on the functioning of your device to ensure it is working properly. You will routinely see your Electrophysiologist annually (more often if necessary).

## 2021-12-13 ENCOUNTER — Ambulatory Visit (INDEPENDENT_AMBULATORY_CARE_PROVIDER_SITE_OTHER): Payer: Commercial Managed Care - HMO

## 2021-12-13 NOTE — Progress Notes (Signed)
Patient presents for wound re-check appointment. Assessed by Dr. Ladona Ridgel who removed suture from incision line. Wound then cleaned and covered with bandaid. Patient instructed to continue to wash 1 to 2 times daily and to call device clinic for any redness, swelling, or drainage, fever or chills. Patient does not have to return for follow up wound check.

## 2021-12-13 NOTE — Patient Instructions (Addendum)
   After Your ICD   Monitor your ICD site for redness, swelling, and drainage. Call the device clinic at [phone] if you experience these symptoms or fever/chills.  Continue to wash your incision 1-2 times a day beginning tomorrow 12/14/21. Leave bandage in place for today.

## 2021-12-16 ENCOUNTER — Other Ambulatory Visit (HOSPITAL_COMMUNITY): Payer: Self-pay

## 2022-01-07 ENCOUNTER — Other Ambulatory Visit (HOSPITAL_COMMUNITY): Payer: Self-pay

## 2022-01-30 ENCOUNTER — Ambulatory Visit (HOSPITAL_COMMUNITY)
Admission: RE | Admit: 2022-01-30 | Discharge: 2022-01-30 | Disposition: A | Discharge status: Home / Self Care | Payer: Commercial Managed Care - HMO | Source: Ambulatory Visit | Attending: Internal Medicine | Admitting: Internal Medicine

## 2022-01-30 ENCOUNTER — Other Ambulatory Visit (HOSPITAL_COMMUNITY): Payer: Self-pay

## 2022-01-30 VITALS — BP 122/88 | HR 85 | Wt 190.0 lb

## 2022-01-30 DIAGNOSIS — Z7982 Long term (current) use of aspirin: Secondary | ICD-10-CM | POA: Insufficient documentation

## 2022-01-30 DIAGNOSIS — F17200 Nicotine dependence, unspecified, uncomplicated: Secondary | ICD-10-CM | POA: Insufficient documentation

## 2022-01-30 DIAGNOSIS — Z720 Tobacco use: Secondary | ICD-10-CM | POA: Diagnosis not present

## 2022-01-30 DIAGNOSIS — I110 Hypertensive heart disease with heart failure: Secondary | ICD-10-CM | POA: Diagnosis not present

## 2022-01-30 DIAGNOSIS — I5022 Chronic systolic (congestive) heart failure: Secondary | ICD-10-CM | POA: Diagnosis not present

## 2022-01-30 DIAGNOSIS — Z95810 Presence of automatic (implantable) cardiac defibrillator: Secondary | ICD-10-CM | POA: Insufficient documentation

## 2022-01-30 DIAGNOSIS — I2510 Atherosclerotic heart disease of native coronary artery without angina pectoris: Secondary | ICD-10-CM

## 2022-01-30 DIAGNOSIS — E11319 Type 2 diabetes mellitus with unspecified diabetic retinopathy without macular edema: Secondary | ICD-10-CM | POA: Insufficient documentation

## 2022-01-30 DIAGNOSIS — Z79899 Other long term (current) drug therapy: Secondary | ICD-10-CM | POA: Diagnosis not present

## 2022-01-30 DIAGNOSIS — R55 Syncope and collapse: Secondary | ICD-10-CM | POA: Insufficient documentation

## 2022-01-30 DIAGNOSIS — E785 Hyperlipidemia, unspecified: Secondary | ICD-10-CM

## 2022-01-30 DIAGNOSIS — Z794 Long term (current) use of insulin: Secondary | ICD-10-CM

## 2022-01-30 DIAGNOSIS — Z7984 Long term (current) use of oral hypoglycemic drugs: Secondary | ICD-10-CM | POA: Insufficient documentation

## 2022-01-30 DIAGNOSIS — E1139 Type 2 diabetes mellitus with other diabetic ophthalmic complication: Secondary | ICD-10-CM

## 2022-01-30 MED ORDER — LOSARTAN POTASSIUM 25 MG PO TABS
25.0000 mg | ORAL_TABLET | Freq: Every evening | ORAL | 3 refills | Status: DC
  Filled 2022-01-30: qty 90, 90d supply, fill #0
  Filled 2022-04-07: qty 90, 90d supply, fill #1
  Filled 2022-07-28: qty 90, 90d supply, fill #2
  Filled 2022-10-11: qty 90, 90d supply, fill #3

## 2022-01-30 NOTE — Addendum Note (Signed)
Encounter addended by: Angeligue Hedges, RN on: 01/30/2022 3:24 PM  Actions taken: Order list changed, Diagnosis association updated

## 2022-01-30 NOTE — Progress Notes (Addendum)
Advanced Heart Failure Clinic Note    PCP: Rhea Bleacher, NP  Cardiologist: Dr. Irish Lack HF: Dr. Haroldine Laws   HPI: Mrs Oquin is a 51 y.o. woman with DM2, tobacco abuse, CAD and systolic HF diagnosed in [id number] with EF 20%.  Admitted 5/21 with ADHF. Echo EF 20% with severe RV dysfunction. R/LHC showed diffuse non-obstructive CAD with chronic dissection/recannalization of mid LAD. Suspect she had an out of hospital MI at some point.  There were no interventional targets.  Outpatient cMRI 8/21 1.  Mild LV dilatation with severe systolic dysfunction (LVEF 30%) 2. Subendocardial LGE c/w prior LAD and RCA infarcts. >50% transmural LGE suggesting nonviability in basal to mid inferior walls, apical anterior/septal walls, and apex. <50% transmurality of LGE suggesting viability in mid anterior wall. 3.  Normal RV size and systolic function (EF [id number])  Echo 01/15/21:  EF 35-40% RV ok. BP low. Entresto decreased, lasix cut back to MWF.  Evaluated in ED 02/20/21 for LOC. Felt to be orthostatic, CT head negative.  Zio-14 day (only able to wear for 4) showed mostly SR, no high-grade arrhythmias.  Follow up 2/23 had syncopal episode 1 day prior. NYHA II, volume ok. She was referred to EP for ILR to exclude high-grade arrhythmias.   Seen in ED 08/11/21 for syncopal episode. CT negative for PE. Orthostatics + with BP dropping to 80s and symptomatic, advised admission for re-hydration but she left AMA.  Echo 03/23: EF 20-25%, RV okay  14 day zio 03/23: SR with rare PACs and occasional PVCs. No significant arrhythmias.  She underwent ICD placement by Dr. Lovena Le in June 2023.  She is here today for routine follow-up. She had b/l retinal detachment s/p failed surgeries. Able to see some shapes but is legally blind. Does some walking but exercise limited by visual impairment. Denies dyspnea, orthopnea, PND, CP or LE edema. Gets fatigued easily with exertion. Left knee is also weak.  Occasional dizziness but  no recent syncope.  Still smoking 1 ppd. No ETOH.  Drinks a lot of fluid but avoids salt.  Cardiac Studies: Echo (8/22): EF 35-40% RV ok.  Echo 5/21: EF 20% with severe RV dysfunction.  RHC 5/21 Ao = 106/75 (87) LV = 107/27 RA = 15 RV = 54/16 PA = 57/25 (40) PCW = 27 (v = 37) Fick cardiac output/index = 4.4/2.0 PVR = 1.4 WU SVR = 1297 Ao sat = 99% PA sat = 62%, 63%  Past Medical History:  Diagnosis Date   Anemia    Anxiety    Arthritis    Cataract    Mixed OU   CHF (congestive heart failure) (HCC)    Coronary artery disease    Diabetes mellitus without complication (HCC)    type 2   Diabetic retinopathy ([place])    PDR OU   Dizziness    Headache    Cluster headaches in the past   History of kidney stones    "2 sitting"   Hyperlipidemia    Hypertension    Hypertensive retinopathy    OU   Myocardial infarction (HCC)    Pneumonia    Syncope    Tobacco abuse    Current Outpatient Medications  Medication Sig Dispense Refill   acetaminophen (TYLENOL) 500 MG tablet Take 1,000 mg by mouth every 6 (six) hours as needed for moderate pain or headache.     aspirin 81 MG chewable tablet Chew 81 mg by mouth at bedtime.     Aspirin-Caffeine (BAYER BACK &  BODY PO) Take 2 tablets by mouth every 6 (six) hours as needed (pain).     carvedilol (COREG) 3.125 MG tablet Take 1 tablet (3.125 mg total) by mouth 2 (two) times daily. 180 tablet 2   cholecalciferol (VITAMIN D) 25 MCG (1000 UNIT) tablet Take 1,000 Units by mouth every evening.     Continuous Blood Gluc Receiver (FREESTYLE LIBRE 2 READER) DEVI Use as directed 1 each 0   Continuous Blood Gluc Sensor (FREESTYLE LIBRE 2 SENSOR) MISC Use as directed 1 each 3   dapagliflozin propanediol (FARXIGA) 10 MG TABS tablet Take 1 tablet (10 mg total) by mouth daily. 90 tablet 3   Dulaglutide (TRULICITY) 3 MG/0.5ML SOPN Inject 3 mg into the skin once a week. 6 mL 0   Evolocumab (REPATHA SURECLICK) 140 MG/ML SOAJ Inject 1 Dose into the  skin every 14 (fourteen) days. 2 mL 11   furosemide (LASIX) 20 MG tablet Take 1 tablet (20 mg total) by mouth 3 (three) times a week. 30 tablet 3   ibuprofen (ADVIL) 200 MG tablet Take 400 mg by mouth every 6 (six) hours as needed for headache or moderate pain.     KLOR-CON M10 10 MEQ tablet TAKE 2 TABLETS BY MOUTH DAILY 180 tablet 3   Phenylephrine-Acetaminophen (SINUS CONGESTION/PAIN PO) Take 2 tablets by mouth every 6 (six) hours as needed (sinus).     spironolactone (ALDACTONE) 25 MG tablet Take 1 tablet (25 mg total) by mouth daily. 90 tablet 3   losartan (COZAAR) 25 MG tablet Take 1 tablet (25 mg total) by mouth at bedtime. 90 tablet 3   No current facility-administered medications for this encounter.   Allergies  Allergen Reactions   Atorvastatin     diarrhea   Rosuvastatin     diarrhea    Social History   Socioeconomic History   Marital status: Married    Spouse name: Not on file   Number of children: Not on file   Years of education: Not on file   Highest education level: Not on file  Occupational History   Not on file  Tobacco Use   Smoking status: Every Day    Packs/day: 1.50    Types: Cigarettes   Smokeless tobacco: Never  Vaping Use   Vaping Use: Never used  Substance and Sexual Activity   Alcohol use: Not Currently   Drug use: Never   Sexual activity: Not on file  Other Topics Concern   Not on file  Social History Narrative   Not on file   Social Determinants of Health   Financial Resource Strain: Not on file  Food Insecurity: Not on file  Transportation Needs: Not on file  Physical Activity: Not on file  Stress: Not on file  Social Connections: Not on file  Intimate Partner Violence: Not on file   Family History  Problem Relation Age of Onset   Sudden Cardiac Death Mother    CAD Father    CAD Paternal Grandmother    CAD Paternal Grandfather    BP 122/88   Pulse 85   Wt 86.2 kg (190 lb)   LMP  (LMP Unknown)   SpO2 97%   BMI 28.89 kg/m    Wt Readings from Last 3 Encounters:  01/30/22 86.2 kg (190 lb)  11/21/21 83 kg (183 lb)  10/28/21 86.6 kg (191 lb)   BP 122/88   Pulse 85   Wt 86.2 kg (190 lb)   LMP  (LMP Unknown)   SpO2  97%   BMI 28.89 kg/m   PHYSICAL EXAM: General:  Well appearing. No resp difficulty HEENT: legally blind Neck: supple. no JVD. Carotids 2+ bilat; no bruits.  Cor: PMI nondisplaced. Regular rate & rhythm. No rubs, gallops or murmurs. Lungs: clear Abdomen: soft, nontender, nondistended.  Extremities: no cyanosis, clubbing, rash, edema, palpable pedal pulses (R > L) Neuro: alert & orientedx3, cranial nerves grossly intact. moves all 4 extremities w/o difficulty. Affect pleasant   [employer] ICD interrogation: HL index 7, no VT, activity level < 1 hr/day   ASSESSMENT & PLAN:  1. Syncope, recurrent - Suspect autonomic dysfunction in setting of advanced DM2.  - Zio 4 day (10/22): showed mostly SR, no high grade arrhythmia - Now with ICD (placed 6/23) - no ventricular arrhythmias. Followed by Dr. Ladona Ridgel.  2. 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 R/LHC showed diffuse non-obstructive CAD with chronic dissection/recannalization of mid LAD. Suspect she had an out of hospital MI at some point.  There were no interventional targets. - cMRI 8/21: LVEF 30% prior LAD & RCA infarcts. RV ok  - Echo 01/15/21:  EF 35-40% RV ok  - Echo 3/23 EF 20-25% RV ok  - S/p ICD 06/23 - Stable NYHA II Volume status ok today by exam and HL index - Increase losartan to 25 mg qhs. BP okay today. She will call if significant issues with orthostasis (Failed Entresto due to low BP) - Continue spiro 25 mg daily. - Continue Coreg 3.125 mg bid. - Continue Farxiga 10 mg daily - Continue lasix M/W/F + as needed. - Reports chronic fatigue especially with activity. She is on maximally tolerated GDMT. Not a candidate for CRT. Will refer for consideration of BaroStim  placement. - Declined sleep study  3. CAD: - LHC 5/21 showed diffuse non-obstructive CAD with chronic dissection/recannalization of mid LAD. Suspect she had an out of hospital MI at some point.  There were no interventional targets. - cMRI as above c/w prior infarcts in LAD/RCA territories - No s/s angina - Continue ASA, FArxiga & Trulicity - Off Crestor and atorva with GI upset. - Followed by lipid clinic - on repatha - Needs complete smoking cessation.  4. Type 2 DM with diabetic retinopathy:  - Management per PCP. - Continue Farxiga & Trulicity. - Recent labs with PCP this month. States A1c is < 7.  5. Tobacco Abuse: - Discussed smoking cessation again today.  - She is not ready to quit.  6. HLD:  - Failed statins. - Follows with Lipid Clinic - on Repatha - Reports recent LDL in 20s at PCPs office  Follow-up: 3-4 months  [facility], Dalbert Garnet, PA-C 01/30/22  Patient seen and examined with the above-signed Advanced Practice Provider and/or Housestaff. I personally reviewed laboratory data, imaging studies and relevant notes. I independently examined the patient and formulated the important aspects of the plan. I have edited the note to reflect any of my changes or salient points. I have personally discussed the plan with the patient and/or family.  Overall doing ok but still with significant fatigue. Denies CP, edema, orthopnea or PND> Tolerating meds well. Now s/p ICD. Has lost most of her vision.   General:  Sitting on exam table.  No resp difficulty HEENT: normal Neck: supple. no JVD. Carotids 2+ bilat; no bruits. No lymphadenopathy or thryomegaly appreciated. Cor: PMI nondisplaced. Regular rate & rhythm. No rubs, gallops or murmurs.ICD site ok  Lungs: clear  Abdomen: soft, nontender, nondistended. No hepatosplenomegaly. No bruits or masses. Good bowel sounds. Extremities: no cyanosis, clubbing, rash, edema Neuro: alert & orientedx3, cranial nerves grossly intact. moves all 4  extremities w/o difficulty. Affect pleasant  Overall stable NYHA III. Volume status looks good. S/p recent ICD. HL score 7. ICD interrogated in Clinic no VT/AF  Not interested in CR or sleep study. We discussed possible Barostim device. She is interested. Increase losartant to 25 daily. LDL well suppressed on repatha. Discussed smoking cessation.  Glori Bickers, MD  3:20 PM

## 2022-01-30 NOTE — Patient Instructions (Signed)
Increase Losartan to 25 mg daily.  Your physician recommends that you schedule a follow-up appointment in: 3 months  If you have any questions or concerns before your next appointment please send [place] a message through [place] or call our office at [phone].    TO LEAVE A MESSAGE FOR THE NURSE SELECT OPTION 2, PLEASE LEAVE A MESSAGE INCLUDING: YOUR NAME DATE OF BIRTH CALL BACK NUMBER REASON FOR CALL**this is important as we prioritize the call backs  YOU WILL RECEIVE A CALL BACK THE SAME DAY AS LONG AS YOU CALL BEFORE 4:00 PM  At the Advanced Heart Failure Clinic, you and your health needs are our priority. As part of our continuing mission to provide you with exceptional heart care, we have created designated Provider Care Teams. These Care Teams include your primary Cardiologist (physician) and Advanced Practice Providers (APPs- Physician Assistants and Nurse Practitioners) who all work together to provide you with the care you need, when you need it.   You may see any of the following providers on your designated Care Team at your next follow up: Dr Arvilla Meres Dr Carron Curie, NP Robbie Lis, [place] [facility] [place], [place] Karle Plumber, PharmD   Please be sure to bring in all your medications bottles to every appointment.

## 2022-02-01 LAB — MISC LABCORP TEST (SEND OUT): Labcorp test code: 143000

## 2022-02-03 ENCOUNTER — Other Ambulatory Visit (HOSPITAL_COMMUNITY): Payer: Self-pay

## 2022-02-06 ENCOUNTER — Ambulatory Visit: Payer: Commercial Managed Care - HMO | Admitting: Surgery

## 2022-02-06 ENCOUNTER — Encounter: Payer: Self-pay | Admitting: Surgery

## 2022-02-06 VITALS — BP 98/64 | HR 91 | Temp 97.7°F | Resp 18 | Ht 68.0 in | Wt 191.0 lb

## 2022-02-06 DIAGNOSIS — I5022 Chronic systolic (congestive) heart failure: Secondary | ICD-10-CM

## 2022-02-06 NOTE — Progress Notes (Signed)
Vascular and Vein Specialist of   Patient name: Shelly [name] MRN: [id number] DOB: 1971/05/11 Sex: female   REQUESTING PROVIDER:    Dr. Gala Romney   REASON FOR CONSULT:    Barostim evaluation  HISTORY OF PRESENT ILLNESS:   Shelly [name] is a 51 y.o. female, who is here today for Barostim evaluation.  The patient suffers from NYHA class II heart failure.  Heart catheterization shows diffuse nonobstructive coronary artery disease with chronic dissection of the mid LAD.  Echocardiogram in March 2023 shows an ejection fraction of 20 to 25%.  The patient had a ICD placed in June 2023.  She has chronic fatigue with activity despite goal-directed medical therapy.  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  She is a current smoker.  She is on Repatha for hyperlipidemia  PAST MEDICAL HISTORY    Past Medical History:  Diagnosis Date   Anemia    Anxiety    Arthritis    Cataract    Mixed OU   CHF (congestive heart failure) (HCC)    Coronary artery disease    Diabetes mellitus without complication (HCC)    type 2   Diabetic retinopathy (HCC)    PDR OU   Dizziness    Headache    Cluster headaches in the past   History of kidney stones    "2 sitting"   Hyperlipidemia    Hypertension    Hypertensive retinopathy    OU   Myocardial infarction (HCC)    Pneumonia    Syncope    Tobacco abuse      FAMILY HISTORY   Family History  Problem Relation Age of Onset   Sudden Cardiac Death Mother    CAD Father    CAD Paternal Grandmother    CAD Paternal Grandfather     SOCIAL HISTORY:   Social History   Socioeconomic History   Marital status: Married    Spouse name: Not on file   Number of children: Not on file   Years of education: Not on file   Highest education level: Not on file  Occupational History   Not on file  Tobacco Use   Smoking status: Every Day    Packs/day: 1.50    Types: Cigarettes    Smokeless tobacco: Never  Vaping Use   Vaping Use: Never used  Substance and Sexual Activity   Alcohol use: Not Currently   Drug use: Never   Sexual activity: Not on file  Other Topics Concern   Not on file  Social History Narrative   Not on file   Social Determinants of Health   Financial Resource Strain: Not on file  Food Insecurity: Not on file  Transportation Needs: Not on file  Physical Activity: Not on file  Stress: Not on file  Social Connections: Not on file  Intimate Partner Violence: Not on file    ALLERGIES:    Allergies  Allergen Reactions   Atorvastatin     diarrhea   Rosuvastatin     diarrhea    CURRENT MEDICATIONS:    Current Outpatient Medications  Medication Sig Dispense Refill   acetaminophen (TYLENOL) 500 MG tablet Take 1,000 mg by mouth every 6 (six) hours as needed for moderate pain or headache.     aspirin 81 MG chewable tablet Chew 81 mg by mouth at bedtime.     Aspirin-Caffeine (BAYER BACK & BODY PO) Take 2 tablets by mouth every 6 (six) hours as needed (pain).  carvedilol (COREG) 3.125 MG tablet Take 1 tablet (3.125 mg total) by mouth 2 (two) times daily. 180 tablet 2   cholecalciferol (VITAMIN D) 25 MCG (1000 UNIT) tablet Take 1,000 Units by mouth every evening.     Continuous Blood Gluc Receiver (FREESTYLE LIBRE 2 READER) DEVI Use as directed 1 each 0   Continuous Blood Gluc Sensor (FREESTYLE LIBRE 2 SENSOR) MISC Use as directed 1 each 3   dapagliflozin propanediol (FARXIGA) 10 MG TABS tablet Take 1 tablet (10 mg total) by mouth daily. 90 tablet 3   Dulaglutide (TRULICITY) 3 MG/0.5ML SOPN Inject 3 mg into the skin once a week. 6 mL 0   Evolocumab (REPATHA SURECLICK) 140 MG/ML SOAJ Inject 1 Dose into the skin every 14 (fourteen) days. 2 mL 11   furosemide (LASIX) 20 MG tablet Take 1 tablet (20 mg total) by mouth 3 (three) times a week. 30 tablet 3   ibuprofen (ADVIL) 200 MG tablet Take 400 mg by mouth every 6 (six) hours as needed for  headache or moderate pain.     KLOR-CON M10 10 MEQ tablet TAKE 2 TABLETS BY MOUTH DAILY 180 tablet 3   losartan (COZAAR) 25 MG tablet Take 1 tablet (25 mg total) by mouth at bedtime. 90 tablet 3   Phenylephrine-Acetaminophen (SINUS CONGESTION/PAIN PO) Take 2 tablets by mouth every 6 (six) hours as needed (sinus).     spironolactone (ALDACTONE) 25 MG tablet Take 1 tablet (25 mg total) by mouth daily. 90 tablet 3   No current facility-administered medications for this visit.    REVIEW OF SYSTEMS:   [X]  denotes positive finding, [ ]  denotes negative finding Cardiac  Comments:  Chest pain or chest pressure:    Shortness of breath upon exertion:    Short of breath when lying flat:    Irregular heart rhythm:        Vascular    Pain in calf, thigh, or hip brought on by ambulation:    Pain in feet at night that wakes you up from your sleep:     Blood clot in your veins:    Leg swelling:         Pulmonary    Oxygen at home:    Productive cough:     Wheezing:         Neurologic    Sudden weakness in arms or legs:     Sudden numbness in arms or legs:     Sudden onset of difficulty speaking or slurred speech:    Temporary loss of vision in one eye:     Problems with dizziness:         Gastrointestinal    Blood in stool:      Vomited blood:         Genitourinary    Burning when urinating:     Blood in urine:        Psychiatric    Major depression:         Hematologic    Bleeding problems:    Problems with blood clotting too easily:        Skin    Rashes or ulcers:        Constitutional    Fever or chills:     PHYSICAL EXAM:   There were no vitals filed for this visit.  GENERAL: The patient is a well-nourished female, in no acute distress. The vital signs are documented above. CARDIAC: There is a regular rate and rhythm.  VASCULAR:  SonoSite was used to evaluate her carotid bifurcation which is slightly above the hyoid PULMONARY: Nonlabored  respirations MUSCULOSKELETAL: There are no major deformities or cyanosis. NEUROLOGIC: No focal weakness or paresthesias are detected. SKIN: There are no ulcers or rashes noted. PSYCHIATRIC: The patient has a normal affect.  STUDIES:   I have reviewed her carotid duplex from February 2023 with the following findings: Right Carotid: Velocities in the right ICA are consistent with a 1-39%  stenosis.   Left Carotid: Velocities in the left ICA are consistent with a 1-39%  stenosis.   Vertebrals:  Bilateral vertebral arteries demonstrate antegrade flow.  Subclavians: Normal flow hemodynamics were seen in bilateral subclavian               arteries.  ASSESSMENT and PLAN   NYHA class II-III heart failure: I discussed with the patient that I feel she is a excellent candidate for Barostim therapy.  I discussed the details of surgery as well as the risks and benefits including infection and bleeding.  All of her questions were answered.  She is here today with her son.  She is eager to get this done because of her low level of energy.  We will work on Therapist, occupational and get her scheduled.   Charlena Cross, MD, FACS [facility] [phone] Pager [phone]

## 2022-02-21 ENCOUNTER — Ambulatory Visit (INDEPENDENT_AMBULATORY_CARE_PROVIDER_SITE_OTHER): Payer: Commercial Managed Care - HMO

## 2022-02-21 DIAGNOSIS — I509 Heart failure, unspecified: Secondary | ICD-10-CM

## 2022-02-21 LAB — CUP PACEART REMOTE DEVICE CHECK
Battery Remaining Longevity: 180 mo
Battery Remaining Percentage: 100 %
Brady Statistic RV Percent Paced: 0 %
Date Time Interrogation Session: 20230915002100
HighPow Impedance: 74 Ohm
Implantable Lead Implant Date: 20230615
Implantable Lead Location: 753860
Implantable Lead Model: 137
Implantable Lead Serial Number: 301168
Implantable Pulse Generator Implant Date: 20230615
Lead Channel Impedance Value: 659 Ohm
Lead Channel Pacing Threshold Amplitude: 0.8 V
Lead Channel Pacing Threshold Pulse Width: 0.4 ms
Lead Channel Setting Pacing Amplitude: 3.5 V
Lead Channel Setting Pacing Pulse Width: 0.4 ms
Lead Channel Setting Sensing Sensitivity: 0.5 mV
Pulse Gen Serial Number: 217479

## 2022-02-26 ENCOUNTER — Encounter: Payer: Commercial Managed Care - HMO | Admitting: Internal Medicine

## 2022-03-04 NOTE — Progress Notes (Signed)
Remote ICD transmission.   

## 2022-03-05 ENCOUNTER — Other Ambulatory Visit (HOSPITAL_COMMUNITY): Payer: Self-pay | Admitting: *Deleted

## 2022-03-05 ENCOUNTER — Other Ambulatory Visit (HOSPITAL_COMMUNITY): Payer: Self-pay

## 2022-03-05 MED ORDER — DULAGLUTIDE 3 MG/0.5ML ~~LOC~~ SOAJ
3.0000 mg | SUBCUTANEOUS | 0 refills | Status: DC
  Filled 2022-03-05: qty 6, 84d supply, fill #0

## 2022-03-05 MED ORDER — FUROSEMIDE 20 MG PO TABS
20.0000 mg | ORAL_TABLET | ORAL | 3 refills | Status: DC
  Filled 2022-03-05: qty 30, 70d supply, fill #0
  Filled 2022-05-09: qty 30, 70d supply, fill #1
  Filled 2022-07-14: qty 30, 70d supply, fill #2
  Filled 2022-09-26: qty 30, 70d supply, fill #3

## 2022-03-19 ENCOUNTER — Other Ambulatory Visit (HOSPITAL_COMMUNITY): Payer: Self-pay

## 2022-03-19 MED ORDER — METRONIDAZOLE 500 MG PO TABS
500.0000 mg | ORAL_TABLET | Freq: Two times a day (BID) | ORAL | 0 refills | Status: DC
Start: 2022-03-19 — End: 2022-08-14
  Filled 2022-03-19: qty 14, 7d supply, fill #0

## 2022-03-20 ENCOUNTER — Other Ambulatory Visit (HOSPITAL_COMMUNITY): Payer: Self-pay

## 2022-03-27 ENCOUNTER — Encounter: Payer: Self-pay | Admitting: Internal Medicine

## 2022-03-27 ENCOUNTER — Ambulatory Visit: Payer: Commercial Managed Care - HMO | Attending: Internal Medicine | Admitting: Internal Medicine

## 2022-03-27 VITALS — BP 110/66 | HR 84 | Ht 68.0 in | Wt 183.2 lb

## 2022-03-27 DIAGNOSIS — I5020 Unspecified systolic (congestive) heart failure: Secondary | ICD-10-CM

## 2022-03-27 DIAGNOSIS — Z95810 Presence of automatic (implantable) cardiac defibrillator: Secondary | ICD-10-CM

## 2022-03-27 DIAGNOSIS — I255 Ischemic cardiomyopathy: Secondary | ICD-10-CM | POA: Diagnosis not present

## 2022-03-27 LAB — CUP PACEART INCLINIC DEVICE CHECK
Date Time Interrogation Session: 20231019100852
HighPow Impedance: 82 Ohm
Implantable Lead Implant Date: 20230615
Implantable Lead Location: 753860
Implantable Lead Model: 137
Implantable Lead Serial Number: 301168
Implantable Pulse Generator Implant Date: 20230615
Lead Channel Impedance Value: 676 Ohm
Lead Channel Pacing Threshold Amplitude: 0.6 V
Lead Channel Pacing Threshold Pulse Width: 0.4 ms
Lead Channel Sensing Intrinsic Amplitude: 25 mV
Lead Channel Setting Pacing Amplitude: 2.5 V
Lead Channel Setting Pacing Pulse Width: 0.4 ms
Lead Channel Setting Sensing Sensitivity: 0.5 mV
Pulse Gen Serial Number: 217479

## 2022-03-27 NOTE — Progress Notes (Signed)
HPI [name] returns today for ongoing evaluation of syncope and chronic systolic heart failure. She is a pleasant 51 yo woman with CAD, with likely out of hospital MI's, chronic systolic heart failure and syncope. Her EF has been up and down but most recently 20-25%. Cardiac monitoring demonstrated NSVT. The patient notes class 2 symptoms. She does not have angina. She works in Therapist, sports. She underwent insertion of a single chamber ICD over 3 months ago. She has done well in the interim. She has minimal incisional pain. Allergies  Allergen Reactions   Atorvastatin     diarrhea   Rosuvastatin     diarrhea     Current Outpatient Medications  Medication Sig Dispense Refill   acetaminophen (TYLENOL) 500 MG tablet Take 1,000 mg by mouth every 6 (six) hours as needed for moderate pain or headache.     aspirin 81 MG chewable tablet Chew 81 mg by mouth at bedtime.     Aspirin-Caffeine (BAYER BACK & BODY PO) Take 2 tablets by mouth every 6 (six) hours as needed (pain).     carvedilol (COREG) 3.125 MG tablet Take 1 tablet (3.125 mg total) by mouth 2 (two) times daily. 180 tablet 2   cholecalciferol (VITAMIN D) 25 MCG (1000 UNIT) tablet Take 1,000 Units by mouth every evening.     dapagliflozin propanediol (FARXIGA) 10 MG TABS tablet Take 1 tablet (10 mg total) by mouth daily. 90 tablet 3   Dulaglutide (TRULICITY) 3 MG/0.5ML SOPN Inject 3 mg into the skin once a week. 6 mL 0   Evolocumab (REPATHA SURECLICK) 140 MG/ML SOAJ Inject 1 Dose into the skin every 14 (fourteen) days. 2 mL 11   furosemide (LASIX) 20 MG tablet Take 1 tablet (20 mg total) by mouth 3 (three) times a week. 30 tablet 3   ibuprofen (ADVIL) 200 MG tablet Take 400 mg by mouth every 6 (six) hours as needed for headache or moderate pain.     KLOR-CON M10 10 MEQ tablet TAKE 2 TABLETS BY MOUTH DAILY 180 tablet 3   losartan (COZAAR) 25 MG tablet Take 1 tablet (25 mg total) by mouth at bedtime. 90 tablet 3    metroNIDAZOLE (FLAGYL) 500 MG tablet Take 1 tablet (500 mg total) by mouth 2 (two) times daily for 7 days. **Do not drink alcohol with medicine** 14 tablet 0   Phenylephrine-Acetaminophen (SINUS CONGESTION/PAIN PO) Take 2 tablets by mouth every 6 (six) hours as needed (sinus).     spironolactone (ALDACTONE) 25 MG tablet Take 1 tablet (25 mg total) by mouth daily. 90 tablet 3   No current facility-administered medications for this visit.     Past Medical History:  Diagnosis Date   Anemia    Anxiety    Arthritis    Cataract    Mixed OU   CHF (congestive heart failure) (HCC)    Coronary artery disease    Diabetes mellitus without complication (HCC)    type 2   Diabetic retinopathy (HCC)    PDR OU   Dizziness    Headache    Cluster headaches in the past   History of kidney stones    "2 sitting"   Hyperlipidemia    Hypertension    Hypertensive retinopathy    OU   Myocardial infarction (HCC)    Pneumonia    Syncope    Tobacco abuse     ROS:   All systems reviewed and negative except as noted in the HPI.  Past Surgical History:  Procedure Laterality Date   EYE SURGERY Left 08/23/2020   PPV - Dr. Bernarda Caffey   ICD IMPLANT N/A 11/21/2021   Procedure: ICD IMPLANT;  Surgeon: Evans Lance, MD;  Location: [facility];  Service: Cardiovascular;  Laterality: N/A;   INJECTION OF SILICONE OIL Left 07/23/863   Procedure: INJECTION OF SILICONE OIL;  Surgeon: Bernarda Caffey, MD;  Location: [facility];  Service: Ophthalmology;  Laterality: Left;   INJECTION OF SILICONE OIL Right 7/84/6962   Procedure: INJECTION OF SILICONE OIL;  Surgeon: Bernarda Caffey, MD;  Location: [facility];  Service: Ophthalmology;  Laterality: Right;   MEMBRANE PEEL Left 08/23/2020   Procedure: MEMBRANE PEEL;  Surgeon: Bernarda Caffey, MD;  Location: [facility];  Service: Ophthalmology;  Laterality: Left;   MEMBRANE PEEL Right 10/04/2020   Procedure: MEMBRANE PEEL;  Surgeon: Bernarda Caffey, MD;  Location: [facility];   Service: Ophthalmology;  Laterality: Right;   PARS PLANA VITRECTOMY Left 08/23/2020   Procedure: PARS PLANA VITRECTOMY WITH 25 GAUGE WITH ENDOLASER;  Surgeon: Bernarda Caffey, MD;  Location: [facility];  Service: Ophthalmology;  Laterality: Left;   PARS PLANA VITRECTOMY Right 10/04/2020   Procedure: PARS PLANA VITRECTOMY WITH 25 GAUGE, INJECTION OF AVASTIN;  Surgeon: Bernarda Caffey, MD;  Location: [facility];  Service: Ophthalmology;  Laterality: Right;   PHOTOCOAGULATION WITH LASER Right 10/04/2020   Procedure: PHOTOCOAGULATION WITH LASER;  Surgeon: Bernarda Caffey, MD;  Location: [facility];  Service: Ophthalmology;  Laterality: Right;   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/23/2020   Procedure: REPAIR OF COMPLEX TRACTION RETINAL DETACHMENT;  Surgeon: Bernarda Caffey, MD;  Location: [facility];  Service: Ophthalmology;  Laterality: Left;   RIGHT/LEFT HEART CATH AND CORONARY ANGIOGRAPHY N/A 10/14/2019   Procedure: RIGHT/LEFT HEART CATH AND CORONARY ANGIOGRAPHY;  Surgeon: Jolaine Artist, MD;  Location: [facility];  Service: Cardiovascular;  Laterality: N/A;     Family History  Problem Relation Age of Onset   Sudden Cardiac Death Mother    CAD Father    CAD Paternal Grandmother    CAD Paternal Grandfather      Social History   Socioeconomic History   Marital status: Married    Spouse name: Not on file   Number of children: Not on file   Years of education: Not on file   Highest education level: Not on file  Occupational History   Not on file  Tobacco Use   Smoking status: Every Day    Packs/day: 0.50    Types: Cigarettes   Smokeless tobacco: Never  Vaping Use   Vaping Use: Never used  Substance and Sexual Activity   Alcohol use: Not Currently   Drug use: Never   Sexual activity: Not on file  Other Topics Concern   Not on file  Social History Narrative   Not on file   Social Determinants of Health   Financial Resource Strain: Not on file  Food Insecurity: Not on file   Transportation Needs: Not on file  Physical Activity: Not on file  Stress: Not on file  Social Connections: Not on file  Intimate Partner Violence: Not on file     BP 110/66   Pulse 84   Ht 5\' 8"  (1.727 m)   Wt 183 lb 3.2 oz (83.1 kg)   LMP  (LMP Unknown)   SpO2 98%   BMI 27.86 kg/m   Physical Exam:  Well appearing NAD HEENT: Unremarkable Neck:  No JVD, no  thyromegally Lymphatics:  No adenopathy Back:  No CVA tenderness Lungs:  Clear with no wheezes HEART:  Regular rate rhythm, no murmurs, no rubs, no clicks Abd:  soft, positive bowel sounds, no organomegally, no rebound, no guarding Ext:  2 plus pulses, no edema, no cyanosis, no clubbing Skin:  No rashes no nodules Neuro:  CN II through XII intact, motor grossly intact  DEVICE  Normal device function.  See PaceArt for details.   Assess/Plan:   ICM - the patient has undergone ICD insertion and appears to be doing well. No angina. Chronic systolic heart failure - her symptoms are class 2. She will continue GDMT.  Syncope - etiology is unclear but has resolved since her ICD was placed. 4. Tobacco abuse - she has been encouraged to stop smoking.   Sharlot Gowda Jenell Dobransky,MD

## 2022-03-27 NOTE — Patient Instructions (Signed)
Medication Instructions:  Your physician recommends that you continue on your current medications as directed. Please refer to the Current Medication list given to you today.  *If you need a refill on your cardiac medications before your next appointment, please call your pharmacy*  Lab Work: None ordered.  If you have labs (blood work) drawn today and your tests are completely normal, you will receive your results only by: [place] (if you have MyChart) OR A paper copy in the mail If you have any lab test that is abnormal or we need to change your treatment, we will call you to review the results.  Testing/Procedures: None ordered.  Follow-Up: At [facility], you and your health needs are our priority.  As part of our continuing mission to provide you with exceptional heart care, we have created designated Provider Care Teams.  These Care Teams include your primary Cardiologist (physician) and Advanced Practice Providers (APPs -  Physician Assistants and Nurse Practitioners) who all work together to provide you with the care you need, when you need it.  We recommend signing up for the patient portal called "MyChart".  Sign up information is provided on this After Visit Summary.  MyChart is used to connect with patients for Virtual Visits (Telemedicine).  Patients are able to view lab/test results, encounter notes, upcoming appointments, etc.  Non-urgent messages can be sent to your provider as well.   To learn more about what you can do with MyChart, go to [email or web address].    Your next appointment:   1 year(s)  The format for your next appointment:   In Person  Provider:   Cristopher Peru, MD{or one of the following Advanced Practice Providers on your designated Care Team:   Tommye Standard, [place] Legrand Como "Jonni Sanger" Chalmers Cater, [place]  Remote monitoring is used to monitor your ICD from home. This monitoring reduces the number of office visits required to check your device to one  time per year. It allows [place] to keep an eye on the functioning of your device to ensure it is working properly. You are scheduled for a device check from home on 05/23/2022. You may send your transmission at any time that day. If you have a wireless device, the transmission will be sent automatically. After your physician reviews your transmission, you will receive a postcard with your next transmission date.  Important Information About [place]

## 2022-04-07 ENCOUNTER — Other Ambulatory Visit (HOSPITAL_COMMUNITY): Payer: Self-pay

## 2022-04-10 ENCOUNTER — Encounter (HOSPITAL_COMMUNITY): Payer: Commercial Managed Care - HMO | Admitting: Internal Medicine

## 2022-04-14 ENCOUNTER — Other Ambulatory Visit (HOSPITAL_COMMUNITY): Payer: Self-pay

## 2022-04-14 MED ORDER — ROPINIROLE HCL 0.5 MG PO TABS
0.5000 mg | ORAL_TABLET | Freq: Every day | ORAL | 0 refills | Status: DC
  Filled 2022-04-14: qty 60, 30d supply, fill #0

## 2022-05-05 ENCOUNTER — Other Ambulatory Visit (HOSPITAL_COMMUNITY): Payer: Self-pay

## 2022-05-09 ENCOUNTER — Other Ambulatory Visit (HOSPITAL_COMMUNITY): Payer: Self-pay

## 2022-05-09 MED ORDER — ROPINIROLE HCL 0.5 MG PO TABS
0.5000 mg | ORAL_TABLET | Freq: Every day | ORAL | 0 refills | Status: DC
  Filled 2022-05-09: qty 60, 30d supply, fill #0

## 2022-05-23 ENCOUNTER — Ambulatory Visit (INDEPENDENT_AMBULATORY_CARE_PROVIDER_SITE_OTHER): Payer: Commercial Managed Care - HMO

## 2022-05-23 DIAGNOSIS — I255 Ischemic cardiomyopathy: Secondary | ICD-10-CM

## 2022-05-23 LAB — CUP PACEART REMOTE DEVICE CHECK
Battery Remaining Longevity: 180 mo
Battery Remaining Percentage: 100 %
Brady Statistic RV Percent Paced: 0 %
Date Time Interrogation Session: 20231215002000
HighPow Impedance: 85 Ohm
Implantable Lead Connection Status: 753985
Implantable Lead Implant Date: 20230615
Implantable Lead Location: 753860
Implantable Lead Model: 137
Implantable Lead Serial Number: 301168
Implantable Pulse Generator Implant Date: 20230615
Lead Channel Impedance Value: 566 Ohm
Lead Channel Pacing Threshold Amplitude: 0.7 V
Lead Channel Pacing Threshold Pulse Width: 0.4 ms
Lead Channel Setting Pacing Amplitude: 2.5 V
Lead Channel Setting Pacing Pulse Width: 0.4 ms
Lead Channel Setting Sensing Sensitivity: 0.5 mV
Pulse Gen Serial Number: 217479
Zone Setting Status: 755011

## 2022-05-26 ENCOUNTER — Other Ambulatory Visit (HOSPITAL_COMMUNITY): Payer: Self-pay

## 2022-05-26 MED ORDER — TRULICITY 3 MG/0.5ML ~~LOC~~ SOAJ
3.0000 mg | SUBCUTANEOUS | 0 refills | Status: DC
  Filled 2022-05-26: qty 6, 84d supply, fill #0

## 2022-05-26 MED ORDER — ROPINIROLE HCL 0.5 MG PO TABS
0.5000 mg | ORAL_TABLET | Freq: Every day | ORAL | 0 refills | Status: DC
  Filled 2022-05-26: qty 180, 90d supply, fill #0

## 2022-05-28 ENCOUNTER — Other Ambulatory Visit (HOSPITAL_COMMUNITY): Payer: Self-pay

## 2022-06-08 ENCOUNTER — Other Ambulatory Visit (HOSPITAL_COMMUNITY): Payer: Self-pay | Admitting: Internal Medicine

## 2022-06-09 ENCOUNTER — Other Ambulatory Visit: Payer: Self-pay

## 2022-06-10 ENCOUNTER — Other Ambulatory Visit (HOSPITAL_COMMUNITY): Payer: Self-pay

## 2022-06-10 MED ORDER — CARVEDILOL 3.125 MG PO TABS
3.1250 mg | ORAL_TABLET | Freq: Two times a day (BID) | ORAL | 2 refills | Status: DC
  Filled 2022-06-10: qty 180, 90d supply, fill #0
  Filled 2022-09-08: qty 180, 90d supply, fill #1
  Filled 2022-12-15: qty 180, 90d supply, fill #2

## 2022-06-12 NOTE — Progress Notes (Signed)
Remote ICD transmission.   

## 2022-06-30 ENCOUNTER — Other Ambulatory Visit (HOSPITAL_COMMUNITY): Payer: Self-pay

## 2022-06-30 ENCOUNTER — Other Ambulatory Visit: Payer: Self-pay

## 2022-07-01 ENCOUNTER — Other Ambulatory Visit (HOSPITAL_COMMUNITY): Payer: Self-pay

## 2022-07-02 ENCOUNTER — Other Ambulatory Visit (HOSPITAL_COMMUNITY): Payer: Self-pay

## 2022-07-14 ENCOUNTER — Other Ambulatory Visit (HOSPITAL_COMMUNITY): Payer: Self-pay

## 2022-07-23 ENCOUNTER — Other Ambulatory Visit (HOSPITAL_COMMUNITY): Payer: Self-pay

## 2022-07-23 MED ORDER — MOXIFLOXACIN HCL 400 MG PO TABS
400.0000 mg | ORAL_TABLET | Freq: Every day | ORAL | 0 refills | Status: DC
  Filled 2022-07-23: qty 7, 7d supply, fill #0

## 2022-07-23 MED ORDER — ALBUTEROL SULFATE HFA 108 (90 BASE) MCG/ACT IN AERS
2.0000 | INHALATION_SPRAY | RESPIRATORY_TRACT | 0 refills | Status: AC
  Filled 2022-07-23: qty 6.7, 30d supply, fill #0

## 2022-07-23 MED ORDER — BENZONATATE 200 MG PO CAPS
200.0000 mg | ORAL_CAPSULE | Freq: Three times a day (TID) | ORAL | 0 refills | Status: DC | PRN
  Filled 2022-07-23: qty 30, 10d supply, fill #0

## 2022-07-28 ENCOUNTER — Other Ambulatory Visit (HOSPITAL_COMMUNITY): Payer: Self-pay

## 2022-08-14 ENCOUNTER — Encounter (HOSPITAL_COMMUNITY): Payer: Self-pay

## 2022-08-14 ENCOUNTER — Ambulatory Visit (HOSPITAL_COMMUNITY)
Admission: RE | Admit: 2022-08-14 | Discharge: 2022-08-14 | Disposition: A | Discharge status: Home / Self Care | Payer: Commercial Managed Care - HMO | Source: Ambulatory Visit | Attending: Cardiology | Admitting: Cardiology

## 2022-08-14 VITALS — BP 120/76 | HR 74 | Wt 202.2 lb

## 2022-08-14 DIAGNOSIS — Z79899 Other long term (current) drug therapy: Secondary | ICD-10-CM | POA: Diagnosis not present

## 2022-08-14 DIAGNOSIS — R55 Syncope and collapse: Secondary | ICD-10-CM | POA: Insufficient documentation

## 2022-08-14 DIAGNOSIS — Z7985 Long-term (current) use of injectable non-insulin antidiabetic drugs: Secondary | ICD-10-CM | POA: Diagnosis not present

## 2022-08-14 DIAGNOSIS — Z8249 Family history of ischemic heart disease and other diseases of the circulatory system: Secondary | ICD-10-CM | POA: Diagnosis not present

## 2022-08-14 DIAGNOSIS — I110 Hypertensive heart disease with heart failure: Secondary | ICD-10-CM | POA: Insufficient documentation

## 2022-08-14 DIAGNOSIS — F17210 Nicotine dependence, cigarettes, uncomplicated: Secondary | ICD-10-CM | POA: Diagnosis not present

## 2022-08-14 DIAGNOSIS — I5020 Unspecified systolic (congestive) heart failure: Secondary | ICD-10-CM

## 2022-08-14 DIAGNOSIS — Z7982 Long term (current) use of aspirin: Secondary | ICD-10-CM | POA: Insufficient documentation

## 2022-08-14 DIAGNOSIS — I2510 Atherosclerotic heart disease of native coronary artery without angina pectoris: Secondary | ICD-10-CM | POA: Diagnosis not present

## 2022-08-14 DIAGNOSIS — I5022 Chronic systolic (congestive) heart failure: Secondary | ICD-10-CM | POA: Diagnosis present

## 2022-08-14 DIAGNOSIS — Z7984 Long term (current) use of oral hypoglycemic drugs: Secondary | ICD-10-CM | POA: Insufficient documentation

## 2022-08-14 DIAGNOSIS — Z95810 Presence of automatic (implantable) cardiac defibrillator: Secondary | ICD-10-CM | POA: Insufficient documentation

## 2022-08-14 DIAGNOSIS — E785 Hyperlipidemia, unspecified: Secondary | ICD-10-CM | POA: Insufficient documentation

## 2022-08-14 DIAGNOSIS — E11319 Type 2 diabetes mellitus with unspecified diabetic retinopathy without macular edema: Secondary | ICD-10-CM | POA: Insufficient documentation

## 2022-08-14 LAB — COMPREHENSIVE METABOLIC PANEL
ALT: 14 U/L (ref 0–44)
AST: 14 U/L — ABNORMAL LOW (ref 15–41)
Albumin: 3.7 g/dL (ref 3.5–5.0)
Alkaline Phosphatase: 72 U/L (ref 38–126)
Anion gap: 9 (ref 5–15)
BUN: 15 mg/dL (ref 6–20)
CO2: 27 mmol/L (ref 22–32)
Calcium: 9.7 mg/dL (ref 8.9–10.3)
Chloride: 100 mmol/L (ref 98–111)
Creatinine, Ser: 1.01 mg/dL — ABNORMAL HIGH (ref 0.44–1.00)
GFR, Estimated: >60 mL/min (ref >60)
Glucose, Bld: 136 mg/dL — ABNORMAL HIGH (ref 70–99)
Potassium: 4.1 mmol/L (ref 3.5–5.1)
Sodium: 136 mmol/L (ref 135–145)
Total Bilirubin: 1 mg/dL (ref 0.3–1.2)
Total Protein: 6.5 g/dL (ref 6.5–8.1)

## 2022-08-14 LAB — CBC
HCT: 43.1 % (ref 36.0–46.0)
Hemoglobin: 14.1 g/dL (ref 12.0–15.0)
MCH: 29 pg (ref 26.0–34.0)
MCHC: 32.7 g/dL (ref 30.0–36.0)
MCV: 88.5 fL (ref 80.0–100.0)
Platelets: 300 10*3/uL (ref 150–400)
RBC: 4.87 MIL/uL (ref 3.87–5.11)
RDW: 12.3 % (ref 11.5–15.5)
WBC: 9 10*3/uL (ref 4.0–10.5)
nRBC: 0 % (ref 0.0–0.2)

## 2022-08-14 LAB — LIPID PANEL
Cholesterol: 148 mg/dL (ref 0–200)
HDL: 49 mg/dL (ref >40)
LDL Cholesterol: 67 mg/dL (ref 0–99)
Total CHOL/HDL Ratio: 3 RATIO
Triglycerides: 160 mg/dL — ABNORMAL HIGH (ref <150)
VLDL: 32 mg/dL (ref 0–40)

## 2022-08-14 LAB — TSH: TSH: 0.923 u[IU]/mL (ref 0.350–4.500)

## 2022-08-14 NOTE — Patient Instructions (Addendum)
Please take a extra dose of Torsemide on Saturday 3/10 and Sunday 3/11 this week, then resume normal dose of every Monday Wednesday and Friday thereafter  Labs today We will only contact you if something comes back abnormal or we need to make some changes. Otherwise no news is good news!  Your physician wants you to follow-up in: 3-4 months with Dr Haroldine Laws  with echo.You will receive a reminder letter in the mail two months in advance. If you don't receive a letter, please call our office to schedule the follow-up appointment.  Your physician has requested that you have an echocardiogram. Echocardiography is a painless test that uses sound waves to create images of your heart. It provides your doctor with information about the size and shape of your heart and how well your heart's chambers and valves are working. This procedure takes approximately one hour. There are no restrictions for this procedure. Please do NOT wear cologne, perfume, aftershave, or lotions (deodorant is allowed). Please arrive 15 minutes prior to your appointment time.    Do the following things EVERYDAY: Weigh yourself in the morning before breakfast. Write it down and keep it in a log. Take your medicines as prescribed Eat low salt foods--Limit salt (sodium) to 2000 mg per day.  Stay as active as you can everyday Limit all fluids for the day to less than 2 liters  At [name], you and your health needs are our priority. As part of our continuing mission to provide you with exceptional heart care, we have created designated Provider Care Teams. These Care Teams include your primary Cardiologist (physician) and Advanced Practice Providers (APPs- Physician Assistants and Nurse Practitioners) who all work together to provide you with the care you need, when you need it.   You may see any of the following providers on your designated Care Team at your next follow up: Dr Glori Bickers Dr Loralie Champagne Dr. Roxana Hires, NP Lyda Jester, [place], [place] Forestine Na, NP Audry Riles, PharmD   Please be sure to bring in all your medications bottles to every appointment.    Thank you for choosing [name]

## 2022-08-14 NOTE — Progress Notes (Signed)
Advanced Heart Failure Clinic Note    PCP: Rhea Bleacher, NP  Cardiologist: Dr. Irish Lack HF: Dr. Haroldine Laws   HPI: Shelly [name] is a 52 y.o. woman with DM2, tobacco abuse, CAD and systolic HF diagnosed in 123456 with EF 20%.  Admitted 5/21 with ADHF. Echo EF 20% with severe RV dysfunction. R/LHC showed diffuse non-obstructive CAD with chronic dissection/recannalization of mid LAD. Suspect she had an out of hospital MI at some point.  There were no interventional targets.  Outpatient cMRI 8/21 1.  Mild LV dilatation with severe systolic dysfunction (LVEF 30%) 2. Subendocardial LGE c/w prior LAD and RCA infarcts. >50% transmural LGE suggesting nonviability in basal to mid inferior walls, apical anterior/septal walls, and apex. <50% transmurality of LGE suggesting viability in mid anterior wall. 3.  Normal RV size and systolic function (EF [id number])  Echo 01/15/21:  EF 35-40% RV ok. BP low. Entresto decreased, lasix cut back to MWF.  Evaluated in ED 02/20/21 for LOC. Felt to be orthostatic, CT head negative.  Zio-14 day (only able to wear for 4) showed mostly SR, no high-grade arrhythmias.  Follow up 2/23 had syncopal episode 1 day prior. NYHA II, volume ok. She was referred to EP for ILR to exclude high-grade arrhythmias.   Seen in ED 08/11/21 for syncopal episode. CT negative for PE. Orthostatics + with BP dropping to 80s and symptomatic, advised admission for re-hydration but she left AMA.  Echo 03/23: EF 20-25%, RV okay  14 day zio 03/23: SR with rare PACs and occasional PVCs. No significant arrhythmias.  She underwent ICD placement by Dr. Lovena Le in June 2023.  She is here today for routine follow-up. Here w/ husband. Physical activity continues to be limited by visual impairment. She had b/l retinal detachment s/p failed surgeries. Able to see some shapes but is legally blind. Able to do most ADLs w/o exertional dyspnea and no CP but complains of chronic fatigue which she attributes to  poor sleep. Husband reports h/o snoring. We discussed possibility of OSA but she refuses sleep study. No LEE, orthopnea/PND. Device interrogation shows fluid index is trending up but has not yet crossed threshold. HL score 11. No VT. EKG today shows NSR 75 bpm, old anterior infarct. BP well controlled 120/76. Still smoking 3 cigs/day and trying to quit.    Cardiac Studies: Echo (8/22): EF 35-40% RV ok.  Echo 5/21: EF 20% with severe RV dysfunction.  RHC 5/21 Ao = 106/75 (87) LV = 107/27 RA = 15 RV = 54/16 PA = 57/25 (40) PCW = 27 (v = 37) Fick cardiac output/index = 4.4/2.0 PVR = 1.4 WU SVR = 1297 Ao sat = 99% PA sat = 62%, 63%  Past Medical History:  Diagnosis Date   Anemia    Anxiety    Arthritis    Cataract    Mixed OU   CHF (congestive heart failure) (HCC)    Coronary artery disease    Diabetes mellitus without complication (HCC)    type 2   Diabetic retinopathy ([place])    PDR OU   Dizziness    Headache    Cluster headaches in the past   History of kidney stones    "2 sitting"   Hyperlipidemia    Hypertension    Hypertensive retinopathy    OU   Myocardial infarction ([place])    Pneumonia    Syncope    Tobacco abuse    Current Outpatient Medications  Medication Sig Dispense Refill   acetaminophen (TYLENOL)  500 MG tablet Take 1,000 mg by mouth every 6 (six) hours as needed for moderate pain or headache.     albuterol (VENTOLIN HFA) 108 (90 Base) MCG/ACT inhaler Inhale 2 puffs into the lungs every 4 - 6 hours as needed for wheezing. 6.7 g 0   aspirin 81 MG chewable tablet Chew 81 mg by mouth at bedtime.     Aspirin-Caffeine (BAYER BACK & BODY PO) Take 2 tablets by mouth every 6 (six) hours as needed (pain).     benzonatate (TESSALON) 200 MG capsule Take 1 capsule (200 mg total) by mouth 3 (three) times daily as needed for cough. 30 capsule 0   carvedilol (COREG) 3.125 MG tablet Take 1 tablet (3.125 mg total) by mouth 2 (two) times daily. 180 tablet 2    cholecalciferol (VITAMIN D) 25 MCG (1000 UNIT) tablet Take 1,000 Units by mouth every evening.     dapagliflozin propanediol (FARXIGA) 10 MG TABS tablet Take 1 tablet (10 mg total) by mouth daily. 90 tablet 3   Dulaglutide (TRULICITY) 3 0000000 SOPN Inject 3 mg into the skin once a week. 6 mL 0   Evolocumab (REPATHA SURECLICK) XX123456 MG/ML SOAJ Inject 1 Dose into the skin every 14 (fourteen) days. 2 mL 11   furosemide (LASIX) 20 MG tablet Take 1 tablet (20 mg total) by mouth 3 (three) times a week. 30 tablet 3   ibuprofen (ADVIL) 200 MG tablet Take 400 mg by mouth every 6 (six) hours as needed for headache or moderate pain.     KLOR-CON M10 10 MEQ tablet TAKE 2 TABLETS BY MOUTH DAILY 180 tablet 3   losartan (COZAAR) 25 MG tablet Take 1 tablet (25 mg total) by mouth at bedtime. 90 tablet 3   metroNIDAZOLE (FLAGYL) 500 MG tablet Take 1 tablet (500 mg total) by mouth 2 (two) times daily for 7 days. **Do not drink alcohol with medicine** 14 tablet 0   moxifloxacin (AVELOX) 400 MG tablet Take 1 tablet (400 mg total) by mouth daily for 7 days. 7 tablet 0   Phenylephrine-Acetaminophen (SINUS CONGESTION/PAIN PO) Take 2 tablets by mouth every 6 (six) hours as needed (sinus).     rOPINIRole (REQUIP) 0.5 MG tablet Take 1-2 tablets (0.5-1 mg total) by mouth at bedtime. 180 tablet 0   spironolactone (ALDACTONE) 25 MG tablet Take 1 tablet (25 mg total) by mouth daily. 90 tablet 3   No current facility-administered medications for this visit.   Allergies  Allergen Reactions   Atorvastatin     diarrhea   Rosuvastatin     diarrhea    Social History   Socioeconomic History   Marital status: Married    Spouse name: Not on file   Number of children: Not on file   Years of education: Not on file   Highest education level: Not on file  Occupational History   Not on file  Tobacco Use   Smoking status: Every Day    Packs/day: 0.50    Types: Cigarettes   Smokeless tobacco: Never  Vaping Use   Vaping  Use: Never used  Substance and Sexual Activity   Alcohol use: Not Currently   Drug use: Never   Sexual activity: Not on file  Other Topics Concern   Not on file  Social History Narrative   Not on file   Social Determinants of Health   Financial Resource Strain: Not on file  Food Insecurity: Not on file  Transportation Needs: Not on file  Physical  Activity: Not on file  Stress: Not on file  Social Connections: Not on file  Intimate Partner Violence: Not on file   Family History  Problem Relation Age of Onset   Sudden Cardiac Death Mother    CAD Father    CAD Paternal Grandmother    CAD Paternal Grandfather    LMP  (LMP Unknown)   Wt Readings from Last 3 Encounters:  03/27/22 83.1 kg (183 lb 3.2 oz)  02/06/22 86.6 kg (191 lb)  01/30/22 86.2 kg (190 lb)   LMP  (LMP Unknown)   PHYSICAL EXAM: General:  fatigue appearing. No respiratory difficulty HEENT: normal Neck: supple. no JVD. Carotids 2+ bilat; no bruits. No lymphadenopathy or thyromegaly appreciated. Cor: PMI nondisplaced. Regular rate & rhythm. No rubs, gallops or murmurs. Lungs: clear Abdomen: soft, nontender, nondistended. No hepatosplenomegaly. No bruits or masses. Good bowel sounds. Extremities: no cyanosis, clubbing, rash, edema Neuro: alert & oriented x 3, cranial nerves grossly intact. moves all 4 extremities w/o difficulty. Affect pleasant.   [employer] ICD interrogation: HL index 11, no VT, activity level < 1 hr/day   ASSESSMENT & PLAN:  1. Syncope, recurrent - Suspect autonomic dysfunction in setting of advanced DM2.  - Zio 4 day (10/22): showed mostly SR, no high grade arrhythmia - Now with ICD (placed 6/23) - no ventricular arrhythmias on device interrogation today - ICD followed by Dr. Lovena Le.  2. 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 R/LHC showed diffuse non-obstructive CAD with chronic dissection/recannalization of mid LAD.  Suspect she had an out of hospital MI at some point.  There were no interventional targets. - cMRI 8/21: LVEF 30% prior LAD & RCA infarcts. RV ok  - Echo 01/15/21:  EF 35-40% RV ok  - Echo 3/23 EF 20-25% RV ok  - S/p ICD 06/23 - Stable NYHA II symptoms, mainly limited by fatigue. Volume ok on exam but fluid index trending up on device interrogation and approaching threshold. HL score 11. Instructed to take extra lasix this week, will take 20 mg on Sat and 20 mg on Sun then return to MWF dosing - Continue losartan 25 mg qhs. (Failed Entresto due to low BP) - Continue spiro 25 mg daily. - Continue Coreg 3.125 mg bid. - Continue Farxiga 10 mg daily - Check CMP today  - Reports chronic fatigue especially with activity. She is on maximally tolerated GDMT. Not a candidate for CRT. We offered referral for BaroStim placement but she declined. She may also have OSA given h/o snoring. We discussed sleep study but she declined.  - Plan repeat echo next visit   3. CAD: - LHC 5/21 showed diffuse non-obstructive CAD with chronic dissection/recannalization of mid LAD. Suspect she had an out of hospital MI at some point.  There were no interventional targets. - cMRI as above c/w prior infarcts in LAD/RCA territories - No s/s angina - Continue ASA, Farxiga & Trulicity - Off Crestor and atorva with GI upset - Followed by lipid clinic - on repatha - Still smoking. Needs complete smoking cessation  4. Type 2 DM with diabetic retinopathy:  - Management per PCP. - Continue Farxiga & Trulicity.  5. Tobacco Abuse: - still smoking ~3 cigs/ day  - Discussed smoking cessation again today, she is trying to quit   6. HLD:  - Failed statins. - Follows with Lipid Clinic - on Ashley Panel today    Follow-up: Plan  repeat echo and f/u w/ Dr. Haroldine Laws in 3 months   [facility]ly [facility], PA-C 08/14/22

## 2022-08-15 ENCOUNTER — Other Ambulatory Visit (HOSPITAL_COMMUNITY): Payer: Self-pay

## 2022-08-17 ENCOUNTER — Encounter (HOSPITAL_COMMUNITY): Payer: Self-pay

## 2022-08-17 ENCOUNTER — Emergency Department (HOSPITAL_COMMUNITY): Payer: Commercial Managed Care - HMO

## 2022-08-17 ENCOUNTER — Other Ambulatory Visit: Payer: Self-pay

## 2022-08-17 ENCOUNTER — Emergency Department (HOSPITAL_COMMUNITY)
Admission: EM | Admit: 2022-08-17 | Discharge: 2022-08-17 | Disposition: A | Discharge status: Home / Self Care | Payer: Commercial Managed Care - HMO | Attending: Emergency Medicine | Admitting: Emergency Medicine

## 2022-08-17 DIAGNOSIS — S0081XA Abrasion of other part of head, initial encounter: Secondary | ICD-10-CM | POA: Insufficient documentation

## 2022-08-17 DIAGNOSIS — Y92009 Unspecified place in unspecified non-institutional (private) residence as the place of occurrence of the external cause: Secondary | ICD-10-CM | POA: Insufficient documentation

## 2022-08-17 DIAGNOSIS — W01198A Fall on same level from slipping, tripping and stumbling with subsequent striking against other object, initial encounter: Secondary | ICD-10-CM | POA: Diagnosis not present

## 2022-08-17 DIAGNOSIS — S0990XA Unspecified injury of head, initial encounter: Secondary | ICD-10-CM

## 2022-08-17 DIAGNOSIS — Z7982 Long term (current) use of aspirin: Secondary | ICD-10-CM | POA: Diagnosis not present

## 2022-08-17 DIAGNOSIS — R0781 Pleurodynia: Secondary | ICD-10-CM | POA: Diagnosis not present

## 2022-08-17 DIAGNOSIS — M79642 Pain in left hand: Secondary | ICD-10-CM | POA: Diagnosis not present

## 2022-08-17 DIAGNOSIS — W19XXXA Unspecified fall, initial encounter: Secondary | ICD-10-CM

## 2022-08-17 DIAGNOSIS — R42 Dizziness and giddiness: Secondary | ICD-10-CM | POA: Diagnosis present

## 2022-08-17 LAB — URINALYSIS, ROUTINE W REFLEX MICROSCOPIC
Bilirubin Urine: NEGATIVE
Glucose, UA: 150 mg/dL — AB
Hgb urine dipstick: NEGATIVE
Ketones, ur: NEGATIVE mg/dL
Leukocytes,Ua: NEGATIVE
Nitrite: NEGATIVE
Protein, ur: NEGATIVE mg/dL
Specific Gravity, Urine: 1.009 (ref 1.005–1.030)
pH: 5 (ref 5.0–8.0)

## 2022-08-17 LAB — CBC
HCT: 45.5 % (ref 36.0–46.0)
Hemoglobin: 15.1 g/dL — ABNORMAL HIGH (ref 12.0–15.0)
MCH: 29.3 pg (ref 26.0–34.0)
MCHC: 33.2 g/dL (ref 30.0–36.0)
MCV: 88.3 fL (ref 80.0–100.0)
Platelets: 287 10*3/uL (ref 150–400)
RBC: 5.15 MIL/uL — ABNORMAL HIGH (ref 3.87–5.11)
RDW: 12.3 % (ref 11.5–15.5)
WBC: 11.2 10*3/uL — ABNORMAL HIGH (ref 4.0–10.5)
nRBC: 0 % (ref 0.0–0.2)

## 2022-08-17 LAB — BASIC METABOLIC PANEL
Anion gap: 12 (ref 5–15)
BUN: 16 mg/dL (ref 6–20)
CO2: 26 mmol/L (ref 22–32)
Calcium: 9.6 mg/dL (ref 8.9–10.3)
Chloride: 97 mmol/L — ABNORMAL LOW (ref 98–111)
Creatinine, Ser: 1.27 mg/dL — ABNORMAL HIGH (ref 0.44–1.00)
GFR, Estimated: 51 mL/min — ABNORMAL LOW (ref >60)
Glucose, Bld: 162 mg/dL — ABNORMAL HIGH (ref 70–99)
Potassium: 4.1 mmol/L (ref 3.5–5.1)
Sodium: 135 mmol/L (ref 135–145)

## 2022-08-17 LAB — TROPONIN I (HIGH SENSITIVITY): Troponin I (High Sensitivity): 8 ng/L (ref <18)

## 2022-08-17 MED ORDER — ACETAMINOPHEN 500 MG PO TABS
1000.0000 mg | ORAL_TABLET | Freq: Once | ORAL | Status: AC
  Administered 2022-08-17: 1000 mg via ORAL
  Filled 2022-08-17: qty 2

## 2022-08-17 MED ORDER — ONDANSETRON HCL 4 MG PO TABS
4.0000 mg | ORAL_TABLET | Freq: Three times a day (TID) | ORAL | 0 refills | Status: DC | PRN
  Filled 2022-08-17: qty 20, 7d supply, fill #0

## 2022-08-17 NOTE — Discharge Instructions (Signed)
Return for any problem.  ?

## 2022-08-17 NOTE — ED Provider Notes (Signed)
[place] Provider Note   CSN: [id number] Arrival date & time: 08/17/22  1242     History  Chief Complaint  Patient presents with   Loss of Consciousness   [place] is a 52 y.o. female.  52 year old female with prior medical history as detailed below presents for evaluation.  Patient reports that she was at home when she felt dizzy.  Patient then fell.  Patient reports that she struck her right cheek against a set of drawers.  She apparently also injured her left ribs.  She complains primarily of pain today to the left hand, left ribs, and right face.  She denies chest pain.  She denies defibrillation with her implanted ICD.  She is ambulatory.  She denies associated LOC.  She reports frequent episodes of dizziness.  She feels that her symptoms today came on suddenly enough that she was unable to sit down before she fell.  The history is provided by the patient and medical records.  Fall       Home Medications Prior to Admission medications   Medication Sig Start Date End Date Taking? Authorizing Provider  acetaminophen (TYLENOL) 500 MG tablet Take 1,000 mg by mouth every 6 (six) hours as needed for moderate pain or headache.    [provider]  albuterol (VENTOLIN HFA) 108 (90 Base) MCG/ACT inhaler Inhale 2 puffs into the lungs every 4 - 6 hours as needed for wheezing. 07/22/22     aspirin 81 MG chewable tablet Chew 81 mg by mouth at bedtime.    [provider]  Aspirin-Caffeine (BAYER BACK & BODY PO) Take 2 tablets by mouth every 6 (six) hours as needed (pain).    [provider]  carvedilol (COREG) 3.125 MG tablet Take 1 tablet (3.125 mg total) by mouth 2 (two) times daily. 06/10/22   Bensimhon, Shaune Pascal, MD  cholecalciferol (VITAMIN D) 25 MCG (1000 UNIT) tablet Take 1,000 Units by mouth every evening.    [provider]  dapagliflozin propanediol (FARXIGA) 10 MG TABS tablet  Take 1 tablet (10 mg total) by mouth daily. 09/16/21   [place], Maricela Bo, FNP  Dulaglutide (TRULICITY) 3 0000000 SOPN Inject 3 mg into the skin once a week. 05/26/22     Evolocumab (REPATHA SURECLICK) [id number] MG/ML SOAJ Inject 1 Dose into the skin every 14 (fourteen) days. 09/02/21   Hilty, Nadean Corwin, MD  furosemide (LASIX) 20 MG tablet Take 1 tablet (20 mg total) by mouth 3 (three) times a week. 03/05/22   Bensimhon, Shaune Pascal, MD  ibuprofen (ADVIL) 200 MG tablet Take 400 mg by mouth every 6 (six) hours as needed for headache or moderate pain.    [provider]  losartan (COZAAR) 25 MG tablet Take 1 tablet (25 mg total) by mouth at bedtime. 01/30/22   Bensimhon, Shaune Pascal, MD  Phenylephrine-Acetaminophen (SINUS CONGESTION/PAIN PO) Take 2 tablets by mouth every 6 (six) hours as needed (sinus).    [provider]  rOPINIRole (REQUIP) 0.5 MG tablet Take 1-2 tablets (0.5-1 mg total) by mouth at bedtime. 05/26/22     spironolactone (ALDACTONE) 25 MG tablet Take 1 tablet (25 mg total) by mouth daily. 08/05/21   Bensimhon, Shaune Pascal, MD      Allergies    Atorvastatin and Rosuvastatin    Review of Systems   Review of Systems  Cardiovascular:  Positive for syncope.  All other systems reviewed and are negative.  Physical Exam Updated Vital Signs BP 132/78 (BP Location: Right Arm)   Pulse 81   Temp 97.8 F (36.6 C) (Oral)   Resp 16   LMP  (LMP Unknown)   SpO2 99%  Physical Exam Vitals and nursing note reviewed.  Constitutional:      General: She is not in acute distress.    Appearance: Normal appearance. She is well-developed.  HENT:     Head: Normocephalic.     Comments: Superficial abrasion to right cheek. Eyes:     Conjunctiva/sclera: Conjunctivae normal.     Pupils: Pupils are equal, round, and reactive to light.  Cardiovascular:     Rate and Rhythm: Normal rate and regular rhythm.     Heart sounds: Normal heart sounds.  Pulmonary:     Effort: Pulmonary effort is  normal. No respiratory distress.     Breath sounds: Normal breath sounds.  Abdominal:     General: There is no distension.     Palpations: Abdomen is soft.     Tenderness: There is no abdominal tenderness.  Musculoskeletal:        General: No deformity. Normal range of motion.     Cervical back: Normal range of motion and neck supple.  Skin:    General: Skin is warm and dry.  Neurological:     General: No focal deficit present.     Mental Status: She is alert and oriented to person, place, and time. Mental status is at baseline.     Cranial Nerves: No cranial nerve deficit.     Motor: No weakness.     Comments: Alert and oriented x 4, GCS 15, normal speech, no focal weakness.     ED Results / Procedures / Treatments   Labs (all labs ordered are listed, but only abnormal results are displayed) Labs Reviewed  BASIC METABOLIC PANEL - Abnormal; Notable for the following components:      Result Value   Chloride 97 (*)    Glucose, Bld 162 (*)    Creatinine, Ser 1.27 (*)    GFR, Estimated 51 (*)    All other components within normal limits  CBC - Abnormal; Notable for the following components:   WBC 11.2 (*)    RBC 5.15 (*)    Hemoglobin 15.1 (*)    All other components within normal limits  URINALYSIS, ROUTINE W REFLEX MICROSCOPIC - Abnormal; Notable for the following components:   Glucose, UA 150 (*)    All other components within normal limits  TROPONIN I (HIGH SENSITIVITY)    EKG EKG Interpretation  Date/Time:  Sunday August 17 2022 12:43:01 EDT Ventricular Rate:  79 PR Interval:  198 QRS Duration: 90 QT Interval:  384 QTC Calculation: 440 R Axis:   39 Text Interpretation: Normal sinus rhythm Possible Inferior infarct , age undetermined Anterolateral infarct , age undetermined Abnormal ECG When compared with ECG of 14-Aug-2022 14:03, PREVIOUS ECG IS PRESENT Confirmed by Dene Gentry [phone]) on 08/17/2022 1:31:37 PM  Radiology CT Head Wo Contrast  Result Date:  08/17/2022 CLINICAL DATA:  Head trauma, moderate-severe; Neck trauma, dangerous injury mechanism (Age 49-64y) EXAM: CT HEAD WITHOUT CONTRAST CT CERVICAL SPINE WITHOUT CONTRAST TECHNIQUE: Multidetector CT imaging of the head and cervical spine was performed following the standard protocol without intravenous contrast. Multiplanar CT image reconstructions of the cervical spine were also generated. RADIATION DOSE REDUCTION: This exam was performed according to the departmental dose-optimization program which includes automated exposure control, adjustment of the mA and/or kV  according to patient size and/or use of iterative reconstruction technique. COMPARISON:  02/20/2021 FINDINGS: CT HEAD FINDINGS Brain: No evidence of acute infarction, hemorrhage, hydrocephalus, extra-axial collection or mass lesion/mass effect. Vascular: No hyperdense vessel or unexpected calcification. Skull: Normal. Negative for fracture or focal lesion. Sinuses/Orbits: Paranasal sinuses and mastoid air cells are clear. High-density material within both globes is unchanged. Other: Negative for scalp hematoma. CT CERVICAL SPINE FINDINGS Alignment: Facet joints are aligned without dislocation or traumatic listhesis. Dens and lateral masses are aligned. Skull base and vertebrae: No acute fracture. No primary bone lesion or focal pathologic process. Soft tissues and spinal canal: No prevertebral fluid or swelling. No visible canal hematoma. Disc levels:  Facet predominant cervical spondylosis. Upper chest: Negative. Other: None. IMPRESSION: 1. No acute intracranial abnormality. 2. No acute fracture or subluxation of the cervical spine. Electronically Signed   By: Davina Poke D.O.   On: 08/17/2022 15:13   CT Cervical Spine Wo Contrast  Result Date: 08/17/2022 CLINICAL DATA:  Head trauma, moderate-severe; Neck trauma, dangerous injury mechanism (Age 22-64y) EXAM: CT HEAD WITHOUT CONTRAST CT CERVICAL SPINE WITHOUT CONTRAST TECHNIQUE:  Multidetector CT imaging of the head and cervical spine was performed following the standard protocol without intravenous contrast. Multiplanar CT image reconstructions of the cervical spine were also generated. RADIATION DOSE REDUCTION: This exam was performed according to the departmental dose-optimization program which includes automated exposure control, adjustment of the mA and/or kV according to patient size and/or use of iterative reconstruction technique. COMPARISON:  02/20/2021 FINDINGS: CT HEAD FINDINGS Brain: No evidence of acute infarction, hemorrhage, hydrocephalus, extra-axial collection or mass lesion/mass effect. Vascular: No hyperdense vessel or unexpected calcification. Skull: Normal. Negative for fracture or focal lesion. Sinuses/Orbits: Paranasal sinuses and mastoid air cells are clear. High-density material within both globes is unchanged. Other: Negative for scalp hematoma. CT CERVICAL SPINE FINDINGS Alignment: Facet joints are aligned without dislocation or traumatic listhesis. Dens and lateral masses are aligned. Skull base and vertebrae: No acute fracture. No primary bone lesion or focal pathologic process. Soft tissues and spinal canal: No prevertebral fluid or swelling. No visible canal hematoma. Disc levels:  Facet predominant cervical spondylosis. Upper chest: Negative. Other: None. IMPRESSION: 1. No acute intracranial abnormality. 2. No acute fracture or subluxation of the cervical spine. Electronically Signed   By: Davina Poke D.O.   On: 08/17/2022 15:13   DG Ribs Unilateral W/Chest Left  Result Date: 08/17/2022 CLINICAL DATA:  Pain after falling. EXAM: LEFT RIBS AND CHEST - 3+ VIEW COMPARISON:  11/21/2021 pain FINDINGS: LEFT-sided AICD with lead to the RIGHT ventricle. The heart is normal in size. Lungs are clear. There is no pneumothorax. Oblique views are performed, showing no evidence for acute rib fracture. IMPRESSION: No evidence for acute rib fracture. Electronically  Signed   By: Nolon Nations M.D.   On: 08/17/2022 14:55   DG Hand Complete Left  Result Date: 08/17/2022 CLINICAL DATA:  Pain after falling. EXAM: LEFT HAND - COMPLETE 3+ VIEW COMPARISON:  None Available. FINDINGS: There is no evidence of fracture or dislocation. There is no evidence of arthropathy or other focal bone abnormality. Soft tissues are unremarkable. IMPRESSION: Negative. Electronically Signed   By: Nolon Nations M.D.   On: 08/17/2022 14:53    Procedures Procedures    Medications Ordered in ED Medications  acetaminophen (TYLENOL) tablet 1,000 mg (1,000 mg Oral Given 08/17/22 1447)    ED Course/ Medical Decision Making/ A&P  Medical Decision Making Amount and/or Complexity of Data Reviewed Labs: ordered. Radiology: ordered.  Risk OTC drugs. Prescription drug management.    Medical Screen Complete  This patient presented to the ED with complaint of dizziness, fall.  This complaint involves an extensive number of treatment options. The initial differential diagnosis includes, but is not limited to, trauma related to fall, metabolic abnormality, etc.  This presentation is: Acute, Chronic, Self-Limited, Previously Undiagnosed, Uncertain Prognosis, Complicated, Systemic Symptoms, and Threat to Life/Bodily Function  Patient reports episode of dizziness that caused her to lose her balance and fall.  She suffered head injury with abrasions to the right cheek.  Imaging obtained is without significant abnormality.  No clear evidence of fracture or other significant pathology identified.  Screening labs obtained are without significant abnormality.  Patient adamant during and throughout ED evaluation that she does not want to be admitted and does not want to stay.  Initial troponin is 8.  Patient offered second troponin check.  She declines waiting for same.  She prefers discharge at this time.  Patient does understand need for close  outpatient follow-up.  Strict return precautions given and understood.  Additional history obtained:  Additional history obtained from [facility] External records from outside sources obtained and reviewed including prior ED visits and prior Inpatient records.    Lab Tests:  I ordered and personally interpreted labs.  The pertinent results include: CBC, BMP, UA, troponin x 1   Imaging Studies ordered:  I ordered imaging studies including CT head, CT C-spine, plain films of left ribs, plain films of left hand I independently visualized and interpreted obtained imaging which showed no acute pathology I agree with the radiologist interpretation.   Cardiac Monitoring:  The patient was maintained on a cardiac monitor.  I personally viewed and interpreted the cardiac monitor which showed an underlying rhythm of: NSR   Medicines ordered:  I ordered medication including Tylenol for pain Reevaluation of the patient after these medicines showed that the patient: improved    Problem List / ED Course:  Fall, head injury   Reevaluation:  After the interventions noted above, I reevaluated the patient and found that they have: improved   Disposition:  After consideration of the diagnostic results and the patients response to treatment, I feel that the patent would benefit from close outpatient follow-up.          Final Clinical Impression(s) / ED Diagnoses Final diagnoses:  Fall, initial encounter  Injury of head, initial encounter    Rx / DC Orders ED Discharge Orders     None         Valarie Merino, MD 08/17/22 1546

## 2022-08-17 NOTE — ED Triage Notes (Addendum)
Pt was going to the bathroom when she felt dizzy and fell. Pt has abrasion to right side of cheek and eyebrow, daughter in law reports a plastic bin of drawers fell onto her back during the fall. Pt c.o pain in her left ribcage and left hand. Pt denies taking any blood thinners but does have a [place], denies feeling any shocks. Pt c.o pain in her head, ribcage, neck and hand. C collar placed in triage

## 2022-08-18 ENCOUNTER — Other Ambulatory Visit: Payer: Self-pay

## 2022-08-18 ENCOUNTER — Other Ambulatory Visit (HOSPITAL_COMMUNITY): Payer: Self-pay

## 2022-08-22 ENCOUNTER — Ambulatory Visit: Payer: Commercial Managed Care - HMO

## 2022-08-22 DIAGNOSIS — I255 Ischemic cardiomyopathy: Secondary | ICD-10-CM

## 2022-08-22 LAB — CUP PACEART REMOTE DEVICE CHECK
Battery Remaining Longevity: 174 mo
Battery Remaining Percentage: 100 %
Brady Statistic RV Percent Paced: 0 %
Date Time Interrogation Session: 20240315003400
HighPow Impedance: 87 Ohm
Implantable Lead Connection Status: 753985
Implantable Lead Implant Date: 20230615
Implantable Lead Location: 753860
Implantable Lead Model: 137
Implantable Lead Serial Number: 301168
Implantable Pulse Generator Implant Date: 20230615
Lead Channel Impedance Value: 549 Ohm
Lead Channel Pacing Threshold Amplitude: 0.7 V
Lead Channel Pacing Threshold Pulse Width: 0.4 ms
Lead Channel Setting Pacing Amplitude: 2.5 V
Lead Channel Setting Pacing Pulse Width: 0.4 ms
Lead Channel Setting Sensing Sensitivity: 0.5 mV
Pulse Gen Serial Number: 217479
Zone Setting Status: 755011

## 2022-08-25 ENCOUNTER — Other Ambulatory Visit (HOSPITAL_COMMUNITY): Payer: Self-pay

## 2022-08-25 ENCOUNTER — Other Ambulatory Visit: Payer: Self-pay | Admitting: Internal Medicine

## 2022-08-25 DIAGNOSIS — I2510 Atherosclerotic heart disease of native coronary artery without angina pectoris: Secondary | ICD-10-CM

## 2022-08-25 DIAGNOSIS — E785 Hyperlipidemia, unspecified: Secondary | ICD-10-CM

## 2022-08-25 MED ORDER — REPATHA SURECLICK 140 MG/ML ~~LOC~~ SOAJ
SUBCUTANEOUS | 3 refills | Status: DC
  Filled 2022-08-25: qty 6, 84d supply, fill #0
  Filled 2022-08-25: qty 2, 28d supply, fill #0
  Filled 2022-09-18: qty 2, 28d supply, fill #1
  Filled 2022-10-22: qty 2, 28d supply, fill #2
  Filled 2022-11-06: qty 2, 28d supply, fill #3
  Filled 2022-12-15: qty 2, 28d supply, fill #4
  Filled 2023-01-03: qty 2, 28d supply, fill #5
  Filled 2023-01-16: qty 2, 28d supply, fill #6
  Filled 2023-02-23: qty 2, 28d supply, fill #7
  Filled 2023-03-24 – 2023-06-23 (×4): qty 2, 28d supply, fill #8
  Filled 2023-07-20: qty 2, 28d supply, fill #9
  Filled 2023-08-18: qty 2, 28d supply, fill #10

## 2022-08-25 MED ORDER — TRULICITY 3 MG/0.5ML ~~LOC~~ SOAJ
3.0000 mg | SUBCUTANEOUS | 0 refills | Status: DC
  Filled 2022-08-25: qty 6, 84d supply, fill #0

## 2022-08-27 ENCOUNTER — Other Ambulatory Visit (HOSPITAL_COMMUNITY): Payer: Self-pay

## 2022-09-03 ENCOUNTER — Telehealth: Payer: Self-pay | Admitting: Internal Medicine

## 2022-09-03 NOTE — Telephone Encounter (Signed)
[id number];Review Type:Prior Auth;Coverage Start Date:09/03/2022;Coverage End Date:09/03/2023;

## 2022-09-03 NOTE — Telephone Encounter (Signed)
PA for repatha sureclick submitted via CMM (Key: [id number])

## 2022-09-04 ENCOUNTER — Encounter: Payer: Self-pay | Admitting: Internal Medicine

## 2022-09-08 ENCOUNTER — Other Ambulatory Visit (HOSPITAL_COMMUNITY): Payer: Self-pay

## 2022-09-08 ENCOUNTER — Other Ambulatory Visit (HOSPITAL_COMMUNITY): Payer: Self-pay | Admitting: Family Medicine

## 2022-09-08 ENCOUNTER — Other Ambulatory Visit (HOSPITAL_COMMUNITY): Payer: Self-pay | Admitting: Internal Medicine

## 2022-09-09 ENCOUNTER — Other Ambulatory Visit (HOSPITAL_COMMUNITY): Payer: Self-pay

## 2022-09-09 MED ORDER — DAPAGLIFLOZIN PROPANEDIOL 10 MG PO TABS
10.0000 mg | ORAL_TABLET | Freq: Every day | ORAL | 3 refills | Status: DC
  Filled 2022-09-09: qty 90, 90d supply, fill #0
  Filled 2022-11-22: qty 90, 90d supply, fill #1
  Filled 2023-02-23: qty 30, 30d supply, fill #2
  Filled 2023-03-31: qty 30, 30d supply, fill #3
  Filled 2023-04-24: qty 30, 30d supply, fill #4
  Filled 2023-05-26: qty 30, 30d supply, fill #5
  Filled 2023-06-23: qty 30, 30d supply, fill #6
  Filled 2023-07-20: qty 30, 30d supply, fill #7

## 2022-09-09 MED ORDER — SPIRONOLACTONE 25 MG PO TABS
25.0000 mg | ORAL_TABLET | Freq: Every day | ORAL | 3 refills | Status: DC
  Filled 2022-09-09: qty 90, 90d supply, fill #0
  Filled 2022-12-15 – 2023-01-16 (×2): qty 90, 90d supply, fill #1
  Filled 2023-04-16: qty 90, 90d supply, fill #2
  Filled 2023-07-15: qty 90, 90d supply, fill #3

## 2022-09-22 NOTE — Progress Notes (Signed)
Remote ICD transmission.   

## 2022-09-26 ENCOUNTER — Other Ambulatory Visit (HOSPITAL_COMMUNITY): Payer: Self-pay

## 2022-09-29 ENCOUNTER — Other Ambulatory Visit (HOSPITAL_COMMUNITY): Payer: Self-pay

## 2022-09-29 MED ORDER — ROPINIROLE HCL 0.5 MG PO TABS
0.5000 mg | ORAL_TABLET | Freq: Every day | ORAL | 0 refills | Status: DC
  Filled 2022-09-29 – 2023-01-17 (×2): qty 180, 90d supply, fill #0

## 2022-09-30 ENCOUNTER — Other Ambulatory Visit (HOSPITAL_COMMUNITY): Payer: Self-pay

## 2022-10-06 ENCOUNTER — Other Ambulatory Visit (HOSPITAL_COMMUNITY): Payer: Self-pay

## 2022-10-13 ENCOUNTER — Other Ambulatory Visit (HOSPITAL_COMMUNITY): Payer: Self-pay

## 2022-10-16 ENCOUNTER — Other Ambulatory Visit (HOSPITAL_COMMUNITY): Payer: Self-pay

## 2022-10-22 ENCOUNTER — Other Ambulatory Visit (HOSPITAL_COMMUNITY): Payer: Self-pay

## 2022-10-23 ENCOUNTER — Other Ambulatory Visit (HOSPITAL_COMMUNITY): Payer: Self-pay

## 2022-11-01 ENCOUNTER — Emergency Department (HOSPITAL_COMMUNITY): Payer: Commercial Managed Care - HMO

## 2022-11-01 ENCOUNTER — Other Ambulatory Visit: Payer: Self-pay

## 2022-11-01 ENCOUNTER — Encounter (HOSPITAL_COMMUNITY): Payer: Self-pay | Admitting: Emergency Medicine

## 2022-11-01 ENCOUNTER — Emergency Department (HOSPITAL_COMMUNITY)
Admission: EM | Admit: 2022-11-01 | Discharge: 2022-11-02 | Disposition: A | Discharge status: Home / Self Care | Payer: Commercial Managed Care - HMO | Attending: Emergency Medicine | Admitting: Emergency Medicine

## 2022-11-01 DIAGNOSIS — Z7982 Long term (current) use of aspirin: Secondary | ICD-10-CM | POA: Diagnosis not present

## 2022-11-01 DIAGNOSIS — I509 Heart failure, unspecified: Secondary | ICD-10-CM | POA: Insufficient documentation

## 2022-11-01 DIAGNOSIS — R079 Chest pain, unspecified: Secondary | ICD-10-CM | POA: Diagnosis present

## 2022-11-01 DIAGNOSIS — E119 Type 2 diabetes mellitus without complications: Secondary | ICD-10-CM | POA: Insufficient documentation

## 2022-11-01 DIAGNOSIS — Z79899 Other long term (current) drug therapy: Secondary | ICD-10-CM | POA: Insufficient documentation

## 2022-11-01 LAB — CBC
HCT: 44.6 % (ref 36.0–46.0)
Hemoglobin: 14.8 g/dL (ref 12.0–15.0)
MCH: 28.6 pg (ref 26.0–34.0)
MCHC: 33.2 g/dL (ref 30.0–36.0)
MCV: 86.3 fL (ref 80.0–100.0)
Platelets: 289 10*3/uL (ref 150–400)
RBC: 5.17 MIL/uL — ABNORMAL HIGH (ref 3.87–5.11)
RDW: 11.8 % (ref 11.5–15.5)
WBC: 11.2 10*3/uL — ABNORMAL HIGH (ref 4.0–10.5)
nRBC: 0 % (ref 0.0–0.2)

## 2022-11-01 LAB — BASIC METABOLIC PANEL
Anion gap: 10 (ref 5–15)
BUN: 13 mg/dL (ref 6–20)
CO2: 26 mmol/L (ref 22–32)
Calcium: 8.9 mg/dL (ref 8.9–10.3)
Chloride: 96 mmol/L — ABNORMAL LOW (ref 98–111)
Creatinine, Ser: 1.28 mg/dL — ABNORMAL HIGH (ref 0.44–1.00)
GFR, Estimated: 50 mL/min — ABNORMAL LOW (ref >60)
Glucose, Bld: 144 mg/dL — ABNORMAL HIGH (ref 70–99)
Potassium: 3.6 mmol/L (ref 3.5–5.1)
Sodium: 132 mmol/L — ABNORMAL LOW (ref 135–145)

## 2022-11-01 LAB — TROPONIN I (HIGH SENSITIVITY)
Troponin I (High Sensitivity): 8 ng/L (ref <18)
Troponin I (High Sensitivity): 8 ng/L (ref <18)

## 2022-11-01 MED ORDER — IOHEXOL 350 MG/ML SOLN
75.0000 mL | Freq: Once | INTRAVENOUS | Status: AC | PRN
  Administered 2022-11-01: 75 mL via INTRAVENOUS

## 2022-11-01 NOTE — ED Provider Notes (Signed)
[place] EMERGENCY DEPARTMENT AT [facility] Provider Note   CSN: [id number] Arrival date & time: 11/01/22  2004     History  Chief Complaint  Patient presents with   Chest Pain    Shelly [name] is a 52 y.o. female.  Patient presents to the emergency department complaining of sharp chest pain which radiates to her back.  Pain began around 4 this afternoon.  She states the pain is currently 6 or 7 out of 10 in severity.  She states she has pain in the left neck and right jaw.  She tells me that her pain is intermittent in nature.  She denies shortness of breath at this time.  She denies nausea, vomiting, abdominal pain, urinary symptoms, headache, fevers.  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   HPI     Home Medications Prior to Admission medications   Medication Sig Start Date End Date Taking? Authorizing Provider  acetaminophen (TYLENOL) 500 MG tablet Take 1,000 mg by mouth every 6 (six) hours as needed for moderate pain or headache.    [provider]  albuterol (VENTOLIN HFA) 108 (90 Base) MCG/ACT inhaler Inhale 2 puffs into the lungs every 4 - 6 hours as needed for wheezing. 07/22/22     aspirin 81 MG chewable tablet Chew 81 mg by mouth at bedtime.    [provider]  Aspirin-Caffeine (BAYER BACK & BODY PO) Take 2 tablets by mouth every 6 (six) hours as needed (pain).    [provider]  carvedilol (COREG) 3.125 MG tablet Take 1 tablet (3.125 mg total) by mouth 2 (two) times daily. 06/10/22   Bensimhon, Bevelyn Buckles, MD  cholecalciferol (VITAMIN D) 25 MCG (1000 UNIT) tablet Take 1,000 Units by mouth every evening.    [provider]  dapagliflozin propanediol (FARXIGA) 10 MG TABS tablet Take 1 tablet (10 mg total) by mouth daily. 09/09/22   Milford, Anderson Malta, FNP  Dulaglutide (TRULICITY) 3 MG/0.5ML SOPN Inject 3 mg into the skin once a  week. 05/26/22     Dulaglutide (TRULICITY) 3 MG/0.5ML SOPN Inject 3 mg as directed once a week. 08/25/22     Evolocumab (REPATHA SURECLICK) 140 MG/ML SOAJ Inject 1 Dose into the skin every 14 (fourteen) days. 08/25/22   Hilty, Lisette Abu, MD  furosemide (LASIX) 20 MG tablet Take 1 tablet (20 mg total) by mouth 3 (three) times a week. 03/05/22   Bensimhon, Bevelyn Buckles, MD  ibuprofen (ADVIL) 200 MG tablet Take 400 mg by mouth every 6 (six) hours as needed for headache or moderate pain.    [provider]  losartan (COZAAR) 25 MG tablet Take 1 tablet (25 mg total) by mouth at bedtime. 01/30/22   Bensimhon, Bevelyn Buckles, MD  ondansetron (ZOFRAN) 4 MG tablet Take 1 tablet (4 mg total) by mouth every 8 (eight) hours as needed for nausea or vomiting. 08/17/22   Wynetta Fines, MD  Phenylephrine-Acetaminophen (SINUS CONGESTION/PAIN PO) Take 2 tablets by mouth every 6 (six) hours as needed (sinus).    [provider]  rOPINIRole (REQUIP) 0.5 MG tablet Take 1-2 tablets (0.5-1 mg total) by mouth at bedtime. 09/29/22     spironolactone (ALDACTONE) 25 MG tablet Take 1 tablet (25 mg total) by mouth daily. 09/09/22   Bensimhon, Bevelyn Buckles, MD      Allergies    Atorvastatin and Rosuvastatin    Review of Systems  Review of Systems  Physical Exam Updated Vital Signs BP 125/78   Pulse 94   Temp 98 F (36.7 C) (Oral)   Resp 18   Wt 86.2 kg   LMP  (LMP Unknown)   SpO2 95%   BMI 28.89 kg/m  Physical Exam Vitals and nursing note reviewed.  Constitutional:      General: She is not in acute distress.    Appearance: She is well-developed.  HENT:     Head: Normocephalic and atraumatic.  Eyes:     Conjunctiva/sclera: Conjunctivae normal.  Cardiovascular:     Rate and Rhythm: Normal rate and regular rhythm.  Pulmonary:     Effort: Pulmonary effort is normal. No respiratory distress.     Breath sounds: Normal breath sounds.  Chest:     Chest wall: No tenderness.  Abdominal:     Palpations:  Abdomen is soft. There is no mass.     Tenderness: There is no abdominal tenderness.  Musculoskeletal:        General: No swelling.     Cervical back: Neck supple.     Right lower leg: No edema.     Left lower leg: No edema.  Skin:    General: Skin is warm and dry.     Capillary Refill: Capillary refill takes less than 2 seconds.  Neurological:     Mental Status: She is alert.  Psychiatric:        Mood and Affect: Mood normal.     ED Results / Procedures / Treatments   Labs (all labs ordered are listed, but only abnormal results are displayed) Labs Reviewed  BASIC METABOLIC PANEL - Abnormal; Notable for the following components:      Result Value   Sodium 132 (*)    Chloride 96 (*)    Glucose, Bld 144 (*)    Creatinine, Ser 1.28 (*)    GFR, Estimated 50 (*)    All other components within normal limits  CBC - Abnormal; Notable for the following components:   WBC 11.2 (*)    RBC 5.17 (*)    All other components within normal limits  TROPONIN I (HIGH SENSITIVITY)  TROPONIN I (HIGH SENSITIVITY)    EKG EKG Interpretation  Date/Time:  Saturday Nov 01 2022 20:13:34 EDT Ventricular Rate:  97 PR Interval:  196 QRS Duration: 90 QT Interval:  336 QTC Calculation: 426 R Axis:   58 Text Interpretation: Normal sinus rhythm Possible Inferior infarct , age undetermined Anterolateral infarct , age undetermined Abnormal ECG When compared with ECG of 17-Aug-2022 12:43, PREVIOUS ECG IS PRESENT Confirmed by Eber Hong ([id number]) on 11/01/2022 9:15:15 PM  Radiology CT Angio Chest PE W and/or Wo Contrast  Result Date: 11/01/2022 CLINICAL DATA:  Pulmonary embolism (PE) suspected, high prob EXAM: CT ANGIOGRAPHY CHEST WITH CONTRAST TECHNIQUE: Multidetector CT imaging of the chest was performed using the standard protocol during bolus administration of intravenous contrast. Multiplanar CT image reconstructions and MIPs were obtained to evaluate the vascular anatomy. RADIATION DOSE REDUCTION:  This exam was performed according to the departmental dose-optimization program which includes automated exposure control, adjustment of the mA and/or kV according to patient size and/or use of iterative reconstruction technique. CONTRAST:  75mL OMNIPAQUE IOHEXOL 350 MG/ML SOLN COMPARISON:  08/12/2021 FINDINGS: Cardiovascular: No filling defects in the pulmonary arteries to suggest pulmonary emboli. Heart is normal size. Aorta is normal caliber. Scattered coronary artery and aortic calcifications. Mediastinum/Nodes: No mediastinal, hilar, or axillary adenopathy. Trachea and esophagus are unremarkable.  Thyroid unremarkable. Lungs/Pleura: No confluent airspace opacities or effusions. Upper Abdomen: No acute findings Musculoskeletal: Chest wall soft tissues are unremarkable. No acute bony abnormality. Review of the MIP images confirms the above findings. IMPRESSION: No evidence of pulmonary embolus. Scattered coronary artery calcifications. No acute cardiopulmonary disease. Aortic Atherosclerosis (ICD10-I70.0). Electronically Signed   By: Charlett Nose M.D.   On: 11/01/2022 23:14   DG Chest 2 View  Result Date: 11/01/2022 CLINICAL DATA:  CP EXAM: CHEST - 2 VIEW COMPARISON:  Chest x-ray 08/17/2022 FINDINGS: The heart and mediastinal contours are within normal limits. Left chest wall cardiac defibrillator in grossly appropriate position. Aortic calcification No focal consolidation. No pulmonary edema. No pleural effusion. No pneumothorax. No acute osseous abnormality. IMPRESSION: 1. No active cardiopulmonary disease. 2.  Aortic Atherosclerosis (ICD10-I70.0). Electronically Signed   By: Tish Frederickson M.D.   On: 11/01/2022 21:02    Procedures Procedures    Medications Ordered in ED Medications  iohexol (OMNIPAQUE) 350 MG/ML injection 75 mL (75 mLs Intravenous Contrast Given 11/01/22 2304)    ED Course/ Medical Decision Making/ A&P                             Medical Decision Making Amount and/or  Complexity of Data Reviewed Labs: ordered. Radiology: ordered.  Risk Prescription drug management.   This patient presents to the ED for concern of chest pain, this involves an extensive number of treatment options, and is a complaint that carries with it a high risk of complications and morbidity.  The differential diagnosis includes ACS, pulmonary embolism, dissection, pneumonia, GERD, musculoskeletal pain, anxiety, others   Co morbidities that complicate the patient evaluation  History of previous MI, implanted defibrillator   Additional history obtained:  Additional history obtained from family at bedside External records from outside source obtained and reviewed including cardiology notes from March 2024.  Patient evaluated at the heart failure clinic   Lab Tests:  I Ordered, and personally interpreted labs.  The pertinent results include: WBC 11.2, creatinine 1.28, sodium 132, initial troponin 8, repeat troponin 8   Imaging Studies ordered:  I ordered imaging studies including chest x-ray, CT angio chest PE study I independently visualized and interpreted imaging which showed no active disease on chest x-ray. No evidence of pulmonary embolus.    Scattered coronary artery calcifications.    No acute cardiopulmonary disease.    Aortic Atherosclerosis   I agree with the radiologist interpretation   Cardiac Monitoring: / EKG:  The patient was maintained on a cardiac monitor.  I personally viewed and interpreted the cardiac monitored which showed an underlying rhythm of: Normal sinus rhythm    Problem List / ED Course / Critical interventions / Medication management  The patient was offered pain medication but declined. I have reviewed the patients home medicines and have made adjustments as needed    Test / Admission - Considered:  Patient with negative troponins x 2, nonischemic EKG.  Very low clinical suspicion of ACS.  No pulmonary embolism or acute  abnormality on CT scan or chest x-ray.  No pneumonia noted.  At this time patient appears stable to follow-up as an outpatient with cardiology.  Plan to discharge home with strict return precautions.         Final Clinical Impression(s) / ED Diagnoses Final diagnoses:  Nonspecific chest pain    Rx / DC Orders ED Discharge Orders  Ordered    Ambulatory referral to Cardiology       Comments: If you have not heard from the Cardiology office within the next 72 hours please call [phone].   11/01/22 2331              Darrick Grinder, PA-C 11/01/22 2339    Eber Hong, MD 11/02/22 [phone]

## 2022-11-01 NOTE — ED Triage Notes (Signed)
Pt presents for central CP that radiates to her back and L jaw that started today at 5pm. Pain constant, shooting, 6/10.  Has taken (2) 81 mg ASA today at home.  H/o smoking, DM, implanted defibrillator, previous MI without stents. Not on a blood thinner.

## 2022-11-01 NOTE — Discharge Instructions (Addendum)
You were evaluated today for chest pain. Your CT scan and chest x-rays were reassuring. Your lab work and EKG were also reassuring for no signs of acute coronary syndrome at this time.  I have placed a referral to cardiology to set up a follow-up appointment for you.  Please contact their office if you do not hear from them within 72 hours.  If you develop any worsening symptoms or other life-threatening symptoms please return to the emergency department.

## 2022-11-06 ENCOUNTER — Other Ambulatory Visit (HOSPITAL_COMMUNITY): Payer: Self-pay | Admitting: Internal Medicine

## 2022-11-06 ENCOUNTER — Other Ambulatory Visit (HOSPITAL_COMMUNITY): Payer: Self-pay

## 2022-11-21 ENCOUNTER — Ambulatory Visit (INDEPENDENT_AMBULATORY_CARE_PROVIDER_SITE_OTHER): Payer: Commercial Managed Care - HMO

## 2022-11-21 DIAGNOSIS — I255 Ischemic cardiomyopathy: Secondary | ICD-10-CM | POA: Diagnosis not present

## 2022-11-21 LAB — CUP PACEART REMOTE DEVICE CHECK
Battery Remaining Longevity: 180 mo
Battery Remaining Percentage: 100 %
Brady Statistic RV Percent Paced: 0 %
Date Time Interrogation Session: 20240614002100
HighPow Impedance: 90 Ohm
Implantable Lead Connection Status: 753985
Implantable Lead Implant Date: 20230615
Implantable Lead Location: 753860
Implantable Lead Model: 137
Implantable Lead Serial Number: 301168
Implantable Pulse Generator Implant Date: 20230615
Lead Channel Impedance Value: 554 Ohm
Lead Channel Pacing Threshold Amplitude: 0.7 V
Lead Channel Pacing Threshold Pulse Width: 0.4 ms
Lead Channel Setting Pacing Amplitude: 2.5 V
Lead Channel Setting Pacing Pulse Width: 0.4 ms
Lead Channel Setting Sensing Sensitivity: 0.5 mV
Pulse Gen Serial Number: 217479
Zone Setting Status: 755011

## 2022-11-22 ENCOUNTER — Other Ambulatory Visit (HOSPITAL_COMMUNITY): Payer: Self-pay

## 2022-11-24 ENCOUNTER — Other Ambulatory Visit: Payer: Self-pay

## 2022-11-25 ENCOUNTER — Other Ambulatory Visit (HOSPITAL_COMMUNITY): Payer: Self-pay

## 2022-11-25 ENCOUNTER — Other Ambulatory Visit (HOSPITAL_COMMUNITY): Payer: Self-pay | Admitting: Internal Medicine

## 2022-11-29 ENCOUNTER — Other Ambulatory Visit (HOSPITAL_COMMUNITY): Payer: Self-pay | Admitting: Internal Medicine

## 2022-12-01 ENCOUNTER — Other Ambulatory Visit (HOSPITAL_COMMUNITY): Payer: Self-pay

## 2022-12-01 MED ORDER — FUROSEMIDE 20 MG PO TABS
20.0000 mg | ORAL_TABLET | ORAL | 3 refills | Status: DC
  Filled 2022-12-01: qty 30, 70d supply, fill #0
  Filled 2023-02-11: qty 30, 70d supply, fill #1
  Filled 2023-04-16: qty 30, 70d supply, fill #2
  Filled 2023-06-26: qty 30, 70d supply, fill #3

## 2022-12-03 ENCOUNTER — Other Ambulatory Visit (HOSPITAL_COMMUNITY): Payer: Self-pay

## 2022-12-08 ENCOUNTER — Other Ambulatory Visit (HOSPITAL_COMMUNITY): Payer: Self-pay

## 2022-12-08 MED ORDER — TRULICITY 3 MG/0.5ML ~~LOC~~ SOAJ
3.0000 mg | SUBCUTANEOUS | 0 refills | Status: DC
  Filled 2022-12-08 – 2022-12-30 (×2): qty 2, 28d supply, fill #0
  Filled 2023-01-03 – 2023-01-23 (×4): qty 2, 28d supply, fill #1
  Filled 2023-02-23: qty 2, 28d supply, fill #2

## 2022-12-09 NOTE — Progress Notes (Signed)
Remote ICD transmission.   

## 2022-12-15 ENCOUNTER — Other Ambulatory Visit (HOSPITAL_COMMUNITY): Payer: Self-pay

## 2022-12-15 MED ORDER — OZEMPIC (1 MG/DOSE) 4 MG/3ML ~~LOC~~ SOPN
1.0000 mg | PEN_INJECTOR | SUBCUTANEOUS | 1 refills | Status: DC
  Filled 2022-12-15: qty 3, 28d supply, fill #0

## 2022-12-16 ENCOUNTER — Other Ambulatory Visit (HOSPITAL_COMMUNITY): Payer: Self-pay

## 2022-12-30 ENCOUNTER — Other Ambulatory Visit (HOSPITAL_COMMUNITY): Payer: Self-pay

## 2022-12-31 ENCOUNTER — Other Ambulatory Visit (HOSPITAL_COMMUNITY): Payer: Self-pay

## 2023-01-05 ENCOUNTER — Other Ambulatory Visit: Payer: Self-pay

## 2023-01-05 ENCOUNTER — Other Ambulatory Visit (HOSPITAL_COMMUNITY): Payer: Self-pay

## 2023-01-08 ENCOUNTER — Other Ambulatory Visit (HOSPITAL_COMMUNITY): Payer: Self-pay

## 2023-01-16 ENCOUNTER — Other Ambulatory Visit (HOSPITAL_COMMUNITY): Payer: Self-pay | Admitting: Internal Medicine

## 2023-01-16 ENCOUNTER — Other Ambulatory Visit (HOSPITAL_COMMUNITY): Payer: Self-pay

## 2023-01-16 MED ORDER — LOSARTAN POTASSIUM 25 MG PO TABS
25.0000 mg | ORAL_TABLET | Freq: Every evening | ORAL | 3 refills | Status: DC
  Filled 2023-01-16: qty 90, 90d supply, fill #0
  Filled 2023-04-16: qty 90, 90d supply, fill #1
  Filled 2023-07-15: qty 90, 90d supply, fill #2
  Filled 2023-10-23: qty 90, 90d supply, fill #3

## 2023-01-18 ENCOUNTER — Other Ambulatory Visit: Payer: Self-pay

## 2023-01-19 ENCOUNTER — Other Ambulatory Visit: Payer: Self-pay

## 2023-01-19 ENCOUNTER — Other Ambulatory Visit (HOSPITAL_COMMUNITY): Payer: Self-pay

## 2023-01-19 MED ORDER — DICLOFENAC SODIUM 1 % EX GEL
2.0000 g | Freq: Four times a day (QID) | CUTANEOUS | 0 refills | Status: AC
  Filled 2023-01-19: qty 100, 30d supply, fill #0

## 2023-01-21 ENCOUNTER — Other Ambulatory Visit (HOSPITAL_COMMUNITY): Payer: Self-pay

## 2023-01-23 ENCOUNTER — Other Ambulatory Visit (HOSPITAL_COMMUNITY): Payer: Self-pay

## 2023-01-23 ENCOUNTER — Telehealth: Payer: Self-pay

## 2023-01-23 ENCOUNTER — Other Ambulatory Visit: Payer: Self-pay

## 2023-01-23 DIAGNOSIS — I5022 Chronic systolic (congestive) heart failure: Secondary | ICD-10-CM

## 2023-01-23 NOTE — Telephone Encounter (Signed)
Patient scheduled for right barostim with Dr. Myra Gianotti on 01/30/23 at [facility]. Nutritional therapist, Joey D. and [employer] rep notified of date/time.

## 2023-01-25 ENCOUNTER — Ambulatory Visit (HOSPITAL_COMMUNITY): Admit: 2023-01-25 | Payer: Commercial Managed Care - HMO | Admitting: Surgery

## 2023-01-25 SURGERY — INSERTION, CAROTID SINUS BAROREFLEX ACTIVATION DEVICE
Anesthesia: General | Laterality: Right

## 2023-01-29 ENCOUNTER — Encounter (HOSPITAL_COMMUNITY): Payer: Self-pay | Admitting: Surgery

## 2023-01-29 ENCOUNTER — Encounter: Payer: Self-pay | Admitting: Internal Medicine

## 2023-01-29 ENCOUNTER — Ambulatory Visit (HOSPITAL_COMMUNITY)
Admission: RE | Admit: 2023-01-29 | Discharge: 2023-01-29 | Disposition: A | Discharge status: Home / Self Care | Payer: Medicare Other | Source: Ambulatory Visit | Attending: Internal Medicine | Admitting: Internal Medicine

## 2023-01-29 ENCOUNTER — Other Ambulatory Visit (HOSPITAL_COMMUNITY): Payer: Self-pay

## 2023-01-29 ENCOUNTER — Other Ambulatory Visit: Payer: Self-pay

## 2023-01-29 ENCOUNTER — Telehealth (HOSPITAL_COMMUNITY): Payer: Self-pay

## 2023-01-29 ENCOUNTER — Ambulatory Visit (HOSPITAL_BASED_OUTPATIENT_CLINIC_OR_DEPARTMENT_OTHER)
Admission: RE | Admit: 2023-01-29 | Discharge: 2023-01-29 | Disposition: A | Discharge status: Home / Self Care | Payer: Medicare Other | Source: Ambulatory Visit

## 2023-01-29 ENCOUNTER — Encounter (HOSPITAL_COMMUNITY): Payer: Self-pay | Admitting: Internal Medicine

## 2023-01-29 VITALS — BP 110/70 | HR 80 | Wt 196.2 lb

## 2023-01-29 DIAGNOSIS — I5020 Unspecified systolic (congestive) heart failure: Secondary | ICD-10-CM | POA: Diagnosis not present

## 2023-01-29 DIAGNOSIS — Z79899 Other long term (current) drug therapy: Secondary | ICD-10-CM | POA: Diagnosis not present

## 2023-01-29 DIAGNOSIS — Z95810 Presence of automatic (implantable) cardiac defibrillator: Secondary | ICD-10-CM | POA: Insufficient documentation

## 2023-01-29 DIAGNOSIS — K30 Functional dyspepsia: Secondary | ICD-10-CM | POA: Insufficient documentation

## 2023-01-29 DIAGNOSIS — I2510 Atherosclerotic heart disease of native coronary artery without angina pectoris: Secondary | ICD-10-CM | POA: Diagnosis not present

## 2023-01-29 DIAGNOSIS — R5383 Other fatigue: Secondary | ICD-10-CM | POA: Diagnosis not present

## 2023-01-29 DIAGNOSIS — Z720 Tobacco use: Secondary | ICD-10-CM | POA: Diagnosis not present

## 2023-01-29 DIAGNOSIS — E785 Hyperlipidemia, unspecified: Secondary | ICD-10-CM

## 2023-01-29 DIAGNOSIS — E1136 Type 2 diabetes mellitus with diabetic cataract: Secondary | ICD-10-CM | POA: Diagnosis not present

## 2023-01-29 DIAGNOSIS — I5022 Chronic systolic (congestive) heart failure: Secondary | ICD-10-CM | POA: Insufficient documentation

## 2023-01-29 DIAGNOSIS — Z7985 Long-term (current) use of injectable non-insulin antidiabetic drugs: Secondary | ICD-10-CM | POA: Diagnosis not present

## 2023-01-29 DIAGNOSIS — R55 Syncope and collapse: Secondary | ICD-10-CM | POA: Diagnosis not present

## 2023-01-29 DIAGNOSIS — E11319 Type 2 diabetes mellitus with unspecified diabetic retinopathy without macular edema: Secondary | ICD-10-CM | POA: Diagnosis not present

## 2023-01-29 DIAGNOSIS — R0683 Snoring: Secondary | ICD-10-CM

## 2023-01-29 DIAGNOSIS — I252 Old myocardial infarction: Secondary | ICD-10-CM | POA: Diagnosis not present

## 2023-01-29 DIAGNOSIS — I959 Hypotension, unspecified: Secondary | ICD-10-CM | POA: Diagnosis not present

## 2023-01-29 DIAGNOSIS — I513 Intracardiac thrombosis, not elsewhere classified: Secondary | ICD-10-CM

## 2023-01-29 LAB — ECHOCARDIOGRAM COMPLETE
Area-P 1/2: 3.42 cm2
S' Lateral: 4.3 cm

## 2023-01-29 MED ORDER — APIXABAN 5 MG PO TABS
5.0000 mg | ORAL_TABLET | Freq: Two times a day (BID) | ORAL | 11 refills | Status: DC
  Filled 2023-01-29: qty 60, 30d supply, fill #0
  Filled 2023-02-23: qty 60, 30d supply, fill #1
  Filled 2023-03-24: qty 60, 30d supply, fill #2
  Filled 2023-04-24: qty 60, 30d supply, fill #3
  Filled 2023-06-23 (×2): qty 60, 30d supply, fill #4
  Filled 2023-07-20: qty 60, 30d supply, fill #5
  Filled 2023-08-18: qty 60, 30d supply, fill #6
  Filled 2023-09-22: qty 60, 30d supply, fill #7
  Filled 2023-10-23: qty 60, 30d supply, fill #8
  Filled 2023-11-20: qty 60, 30d supply, fill #9
  Filled 2024-01-02: qty 60, 30d supply, fill #10

## 2023-01-29 MED ORDER — PERFLUTREN LIPID MICROSPHERE
1.0000 mL | INTRAVENOUS | Status: DC | PRN
  Administered 2023-01-29: 6 mL via INTRAVENOUS

## 2023-01-29 NOTE — Progress Notes (Signed)
ITAMAR home sleep study given to patient, all instructions explained, waiver signed, and CLOUDPAT registration complete.  

## 2023-01-29 NOTE — Progress Notes (Signed)
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 LHC 5/21 showed diffuse non-obstructive CAD with chronic dissection/recannalization of mid LAD. Suspected she had an out of hospital MI at some point.  There were no interventional targets. cMRI c/w prior infarcts in LAD/RCA territories. Per cardiology notes, she reports chronic fatigue especially with activity. She is on maximally tolerated GDMT. Not a candidate for CRT. She was offered Barostim.   Pt seen today 01/29/23 by Dr. Gala Romney with updated echo. Results not yet available.   Pt is legally blind, She had b/l retinal detachment s/p failed surgeries.   NIDDM2, no recent A1c on file. Last dose dulaglutide 01/12/23.  Will need DOS labs and eval.  EKG 11/04/22: Normal sinus rhythm. Rate 97. Possible Inferior infarct , age undetermined. Anterolateral infarct , age undetermined  Perioperative prescription for implanted cardiac device programming per progress note 01/29/23: Device Information:   Clinic EP Physician:  Lewayne Bunting, MD    Device Type:  Defibrillator Manufacturer and Phone #:  [employer]: [phone] Pacemaker Dependent?:  No. Date of Last Device Check:  11/21/2022     Normal Device Function?:  Yes.     Electrophysiologist's Recommendations:   Have magnet available. Provide continuous ECG monitoring when magnet is used or reprogramming is to be performed.  Procedure will likely interfere with device function.  Device should be programmed:  Tachy therapies disabled   Event monitor 04/01/21: 1. Sinus rhythm with 1AVB -  avg HR of 83 bpm 2. Rare PACs 3. Occasional PVCsl (4.0%, 15781)  Cardiac MR 02/06/20: IMPRESSION: 1. Subendocardial late gadolinium enhancement consistent with prior infarcts in LAD and RCA territories. >50% transmurality of LGE suggesting nonviability in basal to mid  inferior walls, apical anterior/septal walls, and apex. <50% transmurality of LGE suggesting viability in mid anterior wall.   2.  Mild LV dilatation with severe systolic dysfunction (EF 30%)   3.  Normal RV size and systolic function (EF 66%)  Right/Left cath 10/14/19: Assessment: 1. CAD with diffuse non-obstructive disease and chronic dissection of mid LAD 2. 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 Elevated filling pressures with moderately reduced output   Plan/Discussion:   Diurese. Titrate HF meds. Ideally will need cMRI to look for degree of LAD scar.     Zannie Cove [facility] Short Stay Center/Anesthesiology Phone [phone] 01/29/2023 4:18 PM

## 2023-01-29 NOTE — Patient Instructions (Signed)
START Eliquis 5 mg Twice daily on Monday 02/02/23  STOP Asprin on Monday 02/02/23   Your provider has recommended that you have a home sleep study (Itamar Test).  We have provided you with the equipment in our office today. Please go ahead and download the app. DO NOT OPEN OR TAMPER WITH THE BOX UNTIL WE ADVISE YOU TO DO SO. Once insurance has approved the test our office will call you with PIN number and approval to proceed with testing. Once you have completed the test you just dispose of the equipment, the information is automatically uploaded to [place] via blue-tooth technology. If your test is positive for sleep apnea and you need a home CPAP machine you will be contacted by Dr Norris Cross office [facility]) to set this up.  Your physician recommends that you schedule a follow-up appointment in: 6 months ( February 2025) ** PLEASE CALL THE OFFICE IN NOVEMBER TO ARRANGE YOUR FOLLOW UP APPOINTMENT. **  If you have any questions or concerns before your next appointment please send [place] a message through [place] or call our office at [phone].    TO LEAVE A MESSAGE FOR THE NURSE SELECT OPTION 2, PLEASE LEAVE A MESSAGE INCLUDING: YOUR NAME DATE OF BIRTH CALL BACK NUMBER REASON FOR CALL**this is important as we prioritize the call backs  YOU WILL RECEIVE A CALL BACK THE SAME DAY AS LONG AS YOU CALL BEFORE 4:00 PM  At the [facility], you and your health needs are our priority. As part of our continuing mission to provide you with exceptional heart care, we have created designated Provider Care Teams. These Care Teams include your primary Cardiologist (physician) and Advanced Practice Providers (APPs- Physician Assistants and Nurse Practitioners) who all work together to provide you with the care you need, when you need it.   You may see any of the following providers on your designated Care Team at your next follow up: Dr Arvilla Meres Dr Marca Ancona Dr. Marcos Eke, NP Robbie Lis, [place] [facility] [place], [place] Brynda Peon, NP Karle Plumber, PharmD   Please be sure to bring in all your medications bottles to every appointment.    Thank you for choosing [facility]

## 2023-01-29 NOTE — Progress Notes (Addendum)
SDW CALL  Patient was given pre-op instructions over the phone. The opportunity was given for the patient to ask questions. No further questions asked. Patient verbalized understanding of instructions given.   PCP - Drusilla Kanner FNP Cardiologist - Loann Quill; Ephraim Hamburger; Dorathy Daft  PPM/ICD - ICD [employer] Device Orders - requested 01/28/23 Rep Notified - Joey D. [phone]-notified by Lucianne Lei RN;see note from 01/23/23.  Chest x-ray - 11/01/22 EKG - 11/01/22 Stress Test -  ECHO - 08/30/21 Cardiac Cath - 10/14/19  Sleep Study - denies CPAP - no  Fasting Blood Sugar - 90-110 Checks Blood Sugar one times a day  Blood Thinner Instructions:na Aspirin Instructions:continue  ERAS Protcol -no PRE-SURGERY Ensure or G2-   COVID TEST- na   Anesthesia review: yes- history MI,CHF,ICD  Patient denies shortness of breath, fever, cough and chest pain over the phone call   Surgical Instructions    Your procedure is scheduled on Friday August 23  Report to [facility] Main Entrance "A" at 5:30A.M., then check in with the Admitting office.  Call this number if you have problems the morning of surgery:  [phone]    Remember:  Do not eat or drink anything after midnight the night before your surgery   Take these medicines the morning of surgery with A SIP OF WATER:  Coreg,Aspirin.  PRN- Albuterol- bring inhaler to hospital with you. Zofran.   As of today, STOP taking any Aleve, Naproxen, Ibuprofen, Motrin, Advil, Goody's, BC's, all herbal medications, fish oil, and all vitamins. No Diclofenac Sodium (Goodsense Arthritis Pain) gel.  WHAT DO I DO ABOUT MY DIABETES MEDICATION?  Hold Trulicity 7 days prior to surgery. Last dose was  01/12/23.  Hold Farxiga 72 hours prior to surgery. Last dose was 01/27/23.  Check your blood sugar the morning of your surgery when you wake up and every 2 hours until you get to the Short Stay unit.  If your blood  sugar is less than 70 mg/dL, you will need to treat for low blood sugar: Do not take insulin. Treat a low blood sugar (less than 70 mg/dL) with  cup of clear juice (cranberry or apple), 4 glucose tablets, OR glucose gel. Recheck blood sugar in 15 minutes after treatment (to make sure it is greater than 70 mg/dL). If your blood sugar is not greater than 70 mg/dL on recheck, call [phone] for further instructions. Report your blood sugar to the short stay nurse when you get to Short Stay.  [facility] is not responsible for any belongings or valuables. .   Do NOT Smoke (Tobacco/Vaping)  24 hours prior to your procedure  If you use a CPAP at night, you may bring your mask for your overnight stay.   Contacts, glasses, hearing aids, dentures or partials may not be worn into surgery, please bring cases for these belongings   Patients discharged the day of surgery will not be allowed to drive home, and someone needs to stay with them for 24 hours.    Special instructions:    Oral Hygiene is also important to reduce your risk of infection.  Remember - BRUSH YOUR TEETH THE MORNING OF SURGERY WITH YOUR REGULAR TOOTHPASTE   Day of Surgery:  Take a shower the day of or night before with antibacterial soap. Wear Clean/Comfortable clothing the morning of surgery Do not apply any deodorants/lotions.   Do not wear jewelry or makeup Do not wear lotions, powders, perfumes/colognes, or deodorant. Do not shave 48 hours  prior to surgery.  Men may shave face and neck. Do not bring valuables to the hospital. Do not wear nail polish, gel polish, artificial nails, or any other type of covering on natural nails (fingers and toes) If you have artificial nails or gel coating that need to be removed by a nail salon, please have this removed prior to surgery. Artificial nails or gel coating may interfere with anesthesia's ability to adequately monitor your vital signs. Remember to brush your teeth WITH YOUR  REGULAR TOOTHPASTE.

## 2023-01-29 NOTE — Progress Notes (Signed)
Height:     ?Weight: ?BMI: ? ?Today's Date: ? ?STOP BANG RISK ASSESSMENT ?S (snore) Have you been told that you snore?     YES ?  ?T (tired) Are you often tired, fatigued, or sleepy during the day?  ? YES  ?O (obstruction) Do you stop breathing, choke, or gasp during sleep? NO ?  ?P (pressure) Do you have or are you being treated for high blood pressure? YES ?  ?B (BMI) Is your body index greater than 35 kg/m? NO ?  ?A (age) Are you 53 years old or older? YES ?  ?N (neck) Do you have a neck circumference greater than 16 inches?  ? NO ?  ?G (gender) Are you a female? NO ?  ?TOTAL STOP/BANG ?YES? ANSWERS 4  ? ?                                                                    For Office Use Only              Procedure Order Form    ?YES to 3+ Stop Bang questions OR two clinical symptoms - patient qualifies for WatchPAT (CPT 95800)     ? ?Clinical Notes: Will consult Sleep Specialist and refer for management of therapy due to patient increased risk of Sleep Apnea. Ordering a sleep study due to the following two clinical symptoms: Excessive daytime sleepiness G47.10 / Morning Headaches G44.221 / Difficulty concentrating R41.840 / Memory problems or poor judgment G31.84 / Personality changes or irritability R45.4 / Loud snoring R06.83 / Unrefreshed by sleep G47.8 / History of high blood pressure R03.0 / Insomnia G47.00  ? ? ? ? ? ?

## 2023-01-29 NOTE — Telephone Encounter (Signed)
Advanced Heart Failure Patient Advocate Encounter  The patient was approved for a [employer] grant that will help cover the cost of Carvedilol, Farxiga, Losartan, Spironolactone.  Total amount awarded, $10,000.  Effective: 12/30/2022 - 12/29/2023.  BIN [id number] PCN [id number] Group [id number] ID [id number]  Pharmacy provided with approval and processing information. Patient informed via phone.  Burnell Blanks, CPhT Rx Patient Advocate Phone: [phone]

## 2023-01-29 NOTE — Progress Notes (Addendum)
Advanced Heart Failure Clinic Note    PCP: Erskine Emery, NP  Cardiologist: Dr. Eldridge Dace HF: Dr. Gala Romney   HPI: Shelly [name] is a 52 y.o. woman with DM2, tobacco abuse, CAD and systolic HF diagnosed in 5/21 with EF 20%.  Admitted 5/21 with ADHF. Echo EF 20% with severe RV dysfunction. R/LHC showed diffuse non-obstructive CAD with chronic dissection/recannalization of mid LAD. Suspect she had an out of hospital MI at some point.  There were no interventional targets.  Outpatient cMRI 8/21 1.  Mild LV dilatation with severe systolic dysfunction (LVEF 30%) 2. Subendocardial LGE c/w prior LAD and RCA infarcts. >50% transmural LGE suggesting nonviability in basal to mid inferior walls, apical anterior/septal walls, and apex. <50% transmurality of LGE suggesting viability in mid anterior wall. 3.  Normal RV size and systolic function (EF 66%)  Echo 01/15/21:  EF 35-40% RV ok. BP low. Entresto decreased, lasix cut back to MWF.  Evaluated in ED 02/20/21 for LOC. Felt to be orthostatic, CT head negative.  Zio-14 day (only able to wear for 4) showed mostly SR, no high-grade arrhythmias.  Follow up 2/23 had syncopal episode 1 day prior. NYHA II, volume ok. She was referred to EP for ILR to exclude high-grade arrhythmias.   Seen in ED 08/11/21 for syncopal episode. CT negative for PE. Orthostatics + with BP dropping to 80s and symptomatic, advised admission for re-hydration but she left AMA.  Echo 03/23: EF 20-25%, RV okay  14 day zio 03/23: SR with rare PACs and occasional PVCs. No significant arrhythmias.  She underwent ICD placement by Dr. Ladona Ridgel in June 2023.  Echo today EF ~30% (read as 20-25%) + apical clot  She is here today for routine follow-up. Here w/ husband. Physical activity continues to be limited by visual impairment. She had b/l retinal detachment s/p failed surgeries. Able to see some shapes but is legally blind. Still working in Therapist, sports. Doing ADLs Easily  fatigued. No edema, orthopnea or PND. Still smoking a little.Compliant with meds. + snoring. Smoking less   Cardiac Studies: Echo (8/22): EF 35-40% RV ok.  Echo 5/21: EF 20% with severe RV dysfunction.  RHC 5/21 Ao = 106/75 (87) LV = 107/27 RA = 15 RV = 54/16 PA = 57/25 (40) PCW = 27 (v = 37) Fick cardiac output/index = 4.4/2.0 PVR = 1.4 WU SVR = 1297 Ao sat = 99% PA sat = 62%, 63%  Past Medical History:  Diagnosis Date   AICD (automatic cardioverter/defibrillator) present    Anemia    Anxiety    Arthritis    Cataract    Mixed OU   CHF (congestive heart failure) (HCC)    Coronary artery disease    Diabetes mellitus without complication (HCC)    type 2   Diabetic retinopathy (HCC)    PDR OU   Dizziness    Headache    Cluster headaches in the past   History of kidney stones    "2 sitting"   Hyperlipidemia    Hypertension    Hypertensive retinopathy    OU   Myocardial infarction (HCC)    Pneumonia    Syncope    Tobacco abuse    Current Outpatient Medications  Medication Sig Dispense Refill   albuterol (VENTOLIN HFA) 108 (90 Base) MCG/ACT inhaler Inhale 2 puffs into the lungs every 4 - 6 hours as needed for wheezing. 6.7 g 0   aspirin 81 MG chewable tablet Chew 81 mg by mouth in the  morning.     carvedilol (COREG) 3.125 MG tablet Take 1 tablet (3.125 mg total) by mouth 2 (two) times daily. 180 tablet 2   cholecalciferol (VITAMIN D) 25 MCG (1000 UNIT) tablet Take 1,000 Units by mouth in the morning.     dapagliflozin propanediol (FARXIGA) 10 MG TABS tablet Take 1 tablet (10 mg total) by mouth daily. 90 tablet 3   Evolocumab (REPATHA SURECLICK) 140 MG/ML SOAJ Inject 1 Dose into the skin every 14 (fourteen) days. 6 mL 3   furosemide (LASIX) 20 MG tablet Take 1 tablet (20 mg total) by mouth 3 (three) times a week. 30 tablet 3   ibuprofen (ADVIL) 200 MG tablet Take 400 mg by mouth every 6 (six) hours as needed for headache or moderate pain.     losartan (COZAAR) 25  MG tablet Take 1 tablet (25 mg total) by mouth at bedtime. 90 tablet 3   Melatonin 10 MG TABS Take 10 mg by mouth at bedtime as needed (sleep.).     ondansetron (ZOFRAN) 4 MG tablet Take 1 tablet (4 mg total) by mouth every 8 (eight) hours as needed for nausea or vomiting. 20 tablet 0   Phenylephrine-Acetaminophen (SINUS CONGESTION/PAIN PO) Take 2 tablets by mouth every 6 (six) hours as needed (sinus).     rOPINIRole (REQUIP) 0.5 MG tablet Take 0.5 mg by mouth at bedtime as needed.     spironolactone (ALDACTONE) 25 MG tablet Take 1 tablet (25 mg total) by mouth daily. 90 tablet 3   diclofenac Sodium (GOODSENSE ARTHRITIS PAIN) 1 % GEL Apply 2 g topically 4 (four) times daily for 7 days to single elbow, wrist or hand; for hand includes palm/fingers/back of hand. (Patient not taking: Reported on 01/29/2023) 100 g 0   Dulaglutide (TRULICITY) 3 MG/0.5ML SOPN Inject 3 mg into the skin once a week. (Patient not taking: Reported on 01/29/2023) 6 mL 0   No current facility-administered medications for this encounter.   Facility-Administered Medications Ordered in Other Encounters  Medication Dose Route Frequency Provider Last Rate Last Admin   perflutren lipid microspheres (DEFINITY) IV suspension  1-10 mL Intravenous PRN Khristin Keleher, Bevelyn Buckles, MD   6 mL at 01/29/23 1204   Allergies  Allergen Reactions   Crestor [Rosuvastatin] Diarrhea   Lipitor [Atorvastatin] Diarrhea    Social History   Socioeconomic History   Marital status: Married    Spouse name: Not on file   Number of children: Not on file   Years of education: Not on file   Highest education level: Not on file  Occupational History   Not on file  Tobacco Use   Smoking status: Every Day    Current packs/day: 0.50    Types: Cigarettes   Smokeless tobacco: Never  Vaping Use   Vaping status: Never Used  Substance and Sexual Activity   Alcohol use: Not Currently   Drug use: Never   Sexual activity: Not on file  Other Topics Concern    Not on file  Social History Narrative   Not on file   Social Determinants of Health   Financial Resource Strain: Not on file  Food Insecurity: Not on file  Transportation Needs: Not on file  Physical Activity: Not on file  Stress: Not on file  Social Connections: Not on file  Intimate Partner Violence: Not on file   Family History  Problem Relation Age of Onset   Sudden Cardiac Death Mother    CAD Father    CAD Paternal Grandmother  CAD Paternal Grandfather    BP 110/70   Pulse 80   Wt 89 kg (196 lb 3.2 oz)   LMP  (LMP Unknown)   SpO2 97%   BMI 29.83 kg/m   Wt Readings from Last 3 Encounters:  01/29/23 89 kg (196 lb 3.2 oz)  11/01/22 86.2 kg (190 lb)  08/14/22 91.7 kg (202 lb 3.2 oz)   BP 110/70   Pulse 80   Wt 89 kg (196 lb 3.2 oz)   LMP  (LMP Unknown)   SpO2 97%   BMI 29.83 kg/m   PHYSICAL EXAM: General:  Sitting in chiar. No resp difficulty HEENT: normal Neck: supple. no JVD. Carotids 2+ bilat; no bruits. No lymphadenopathy or thryomegaly appreciated. Cor: PMI nondisplaced. Regular rate & rhythm. No rubs, gallops or murmurs. Lungs: clear Abdomen: soft, nontender, nondistended. No hepatosplenomegaly. No bruits or masses. Good bowel sounds. Extremities: no cyanosis, clubbing, rash, edema Neuro: alert & orientedx3, cranial nerves grossly intact. moves all 4 extremities w/o difficulty. Affect pleasant   [employer] ICD interrogation: not done today   ASSESSMENT & PLAN:  1. Syncope, recurrent - Suspect autonomic dysfunction in setting of advanced DM2.  - Zio 4 day (10/22): showed mostly SR, no high grade arrhythmia - Now with ICD (placed 6/23) - - ICD followed by Dr. Ladona Ridgel. - No recent event  2. 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 R/LHC showed diffuse non-obstructive CAD with chronic dissection/recannalization of mid LAD. Suspect she had an out of hospital MI at some point.  There were no  interventional targets. - cMRI 8/21: LVEF 30% prior LAD & RCA infarcts. RV ok  - Echo 01/15/21:  EF 35-40% RV ok  - Echo 3/23 EF 20-25% RV ok  - S/p ICD 06/23 - Echo EF ~ 30% + apical clot (read as EF 20-25%) - Stable NYHA II-III symptoms - Volume ok - Continue losartan 25 mg qhs. (Failed Entresto due to low BP) - Continue spiro 25 mg daily. - Continue Coreg 3.125 mg bid. - Continue Farxiga 10 mg daily - Pending Barostim placement tomorrow    3. CAD: - LHC 5/21 showed diffuse non-obstructive CAD with chronic dissection/recannalization of mid LAD. Suspect she had an out of hospital MI at some point.  There were no interventional targets. - cMRI as above c/w prior infarcts in LAD/RCA territories - No s/s angina - Continue ASA, Farxiga & Trulicity - Off Crestor and atorva with GI upset - Followed by lipid clinic - on repatha - Still smoking. Needs complete smoking cessation  4. LV apical clot - planned Barostim tomorrow - will start Eliquis 5 bid on Monday (d/w Dr. Myra Gianotti)  5. Type 2 DM with diabetic retinopathy:  - Management per PCP. - Continue Farxiga & Trulicity.  6. Tobacco Abuse: - still smoking ~3 cigs/ day  - Discussed smoking cessation again today, she is trying to quit   7. HLD:  - Failed statins. - Follows with Lipid Clinic - on Repatha - Check Lipid Panel today   8. Snoring/fatigue - will do home sleep study  Arvilla Meres, MD 01/29/23

## 2023-01-29 NOTE — Progress Notes (Signed)
PERIOPERATIVE PRESCRIPTION FOR IMPLANTED CARDIAC DEVICE PROGRAMMING  Patient Information: Name:  Shelly [name]  DOB:  December 03, 1970  MRN:  [id number]    Planned Procedure:  Insertion of right barostim  Surgeon:  Nicola Police  Date of Procedure:  01/30/23  Cautery will be used.  Position during surgery:  supine   Please send documentation back to:  [facility] (Fax # [phone])  Device Information:  Clinic EP Physician:  Lewayne Bunting, MD   Device Type:  Defibrillator Manufacturer and Phone #:  [employer]: [phone] Pacemaker Dependent?:  No. Date of Last Device Check:  11/21/2022 Normal Device Function?:  Yes.    Electrophysiologist's Recommendations:  Have magnet available. Provide continuous ECG monitoring when magnet is used or reprogramming is to be performed.  Procedure will likely interfere with device function.  Device should be programmed:  Tachy therapies disabled  Per Device Clinic [address] [place], [place]  9:23 AM 01/29/2023

## 2023-01-29 NOTE — Anesthesia Preprocedure Evaluation (Addendum)
Anesthesia Evaluation  Patient identified by MRN, date of birth, ID band Patient awake    Reviewed: Allergy & Precautions, NPO status , Patient's Chart, lab work & pertinent test results  Airway Mallampati: III  TM Distance: >3 FB Neck ROM: Full    Dental  (+) Dental Advisory Given   Pulmonary neg shortness of breath, neg COPD, neg recent URI, Current Smoker and Patient abstained from smoking.   breath sounds clear to auscultation       Cardiovascular hypertension, + CAD, + Past MI and +CHF   Rhythm:Regular     Neuro/Psych  Headaches PSYCHIATRIC DISORDERS Anxiety        GI/Hepatic negative GI ROS, Neg liver ROS,,,  Endo/Other  diabetes    Renal/GU negative Renal ROS     Musculoskeletal  (+) Arthritis ,    Abdominal   Peds  Hematology  (+) Blood dyscrasia, anemia   Anesthesia Other Findings   Reproductive/Obstetrics                             Anesthesia Physical Anesthesia Plan  ASA: 4  Anesthesia Plan: General   Post-op Pain Management:    Induction: Intravenous  PONV Risk Score and Plan: 2 and Ondansetron and Dexamethasone  Airway Management Planned: Oral ETT  Additional Equipment: Arterial line  Intra-op Plan:   Post-operative Plan: Extubation in OR  Informed Consent: I have reviewed the patients History and Physical, chart, labs and discussed the procedure including the risks, benefits and alternatives for the proposed anesthesia with the patient or authorized representative who has indicated his/her understanding and acceptance.     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 LHC 5/21 showed diffuse non-obstructive CAD with chronic dissection/recannalization of mid LAD.  Suspected she had an out of hospital MI at some point.  There were no interventional targets. cMRI c/w prior infarcts in LAD/RCA territories. Per cardiology notes, she reports chronic fatigue especially with activity. She is on maximally tolerated GDMT. Not a candidate for CRT. She was offered Barostim.   Pt seen today 01/29/23 by Dr. Gala Romney with updated echo. Results not yet available.   Pt is legally blind, She had b/l retinal detachment s/p failed surgeries.   NIDDM2, no recent A1c on file. Last dose dulaglutide 01/12/23.  Will need DOS labs and eval.  EKG 11/04/22: Normal sinus rhythm. Rate 97. Possible Inferior infarct , age undetermined. Anterolateral infarct , age undetermined  Perioperative prescription for implanted cardiac device programming per progress note 01/29/23: Device Information:  Clinic EP Physician:  Lewayne Bunting, MD   Device Type:  Defibrillator Manufacturer and Phone #:  [employer]: [phone] Pacemaker Dependent?:  No. Date of Last Device Check:  11/21/2022     Normal Device Function?:  Yes.    Electrophysiologist's Recommendations:   Have magnet available.  Provide continuous ECG monitoring when magnet is used or reprogramming is to be performed.   Procedure will likely interfere with device function.  Device should be programmed:  Tachy therapies disabled   Event monitor 04/01/21: 1. Sinus rhythm with 1AVB -  avg HR of 83 bpm 2. Rare PACs 3. Occasional PVCsl (4.0%, 15781)  Cardiac MR 02/06/20: IMPRESSION: 1. Subendocardial late gadolinium enhancement consistent with prior infarcts in LAD and RCA  territories. >50% transmurality of LGE suggesting nonviability in basal to mid inferior walls, apical anterior/septal walls, and apex. <50% transmurality of LGE suggesting viability in mid anterior wall.  2.  Mild LV dilatation with severe systolic dysfunction (EF 30%)  3.  Normal RV size and systolic function (EF 66%)  Right/Left cath  10/14/19: Assessment: 1. CAD with diffuse non-obstructive disease and chronic dissection of mid LAD 2. 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 Elevated filling pressures with moderately reduced output  Plan/Discussion:  Diurese. Titrate HF meds. Ideally will need cMRI to look for degree of LAD scar.    )        Anesthesia Quick Evaluation

## 2023-01-30 ENCOUNTER — Other Ambulatory Visit: Payer: Self-pay

## 2023-01-30 ENCOUNTER — Ambulatory Visit (HOSPITAL_BASED_OUTPATIENT_CLINIC_OR_DEPARTMENT_OTHER): Payer: Medicare Other | Admitting: Physician Assistant

## 2023-01-30 ENCOUNTER — Encounter (HOSPITAL_COMMUNITY): Payer: Self-pay | Admitting: Surgery

## 2023-01-30 ENCOUNTER — Ambulatory Visit (HOSPITAL_COMMUNITY)
Admission: RE | Admit: 2023-01-30 | Discharge: 2023-01-30 | Disposition: A | Discharge status: Home / Self Care | Payer: Medicare Other | Attending: Surgery | Admitting: Surgery

## 2023-01-30 ENCOUNTER — Other Ambulatory Visit (HOSPITAL_COMMUNITY): Payer: Self-pay

## 2023-01-30 ENCOUNTER — Ambulatory Visit (HOSPITAL_COMMUNITY): Payer: Medicare Other | Admitting: Physician Assistant

## 2023-01-30 ENCOUNTER — Encounter (HOSPITAL_COMMUNITY)
Admission: RE | Disposition: A | Discharge status: Home / Self Care | Payer: Self-pay | Source: Home / Self Care | Attending: Surgery

## 2023-01-30 DIAGNOSIS — Z7985 Long-term (current) use of injectable non-insulin antidiabetic drugs: Secondary | ICD-10-CM | POA: Insufficient documentation

## 2023-01-30 DIAGNOSIS — F17210 Nicotine dependence, cigarettes, uncomplicated: Secondary | ICD-10-CM

## 2023-01-30 DIAGNOSIS — H548 Legal blindness, as defined in USA: Secondary | ICD-10-CM | POA: Diagnosis not present

## 2023-01-30 DIAGNOSIS — I252 Old myocardial infarction: Secondary | ICD-10-CM | POA: Insufficient documentation

## 2023-01-30 DIAGNOSIS — I5089 Other heart failure: Secondary | ICD-10-CM | POA: Diagnosis present

## 2023-01-30 DIAGNOSIS — I509 Heart failure, unspecified: Secondary | ICD-10-CM

## 2023-01-30 DIAGNOSIS — I255 Ischemic cardiomyopathy: Secondary | ICD-10-CM | POA: Insufficient documentation

## 2023-01-30 DIAGNOSIS — I428 Other cardiomyopathies: Secondary | ICD-10-CM | POA: Diagnosis not present

## 2023-01-30 DIAGNOSIS — I110 Hypertensive heart disease with heart failure: Secondary | ICD-10-CM | POA: Diagnosis not present

## 2023-01-30 DIAGNOSIS — Z7982 Long term (current) use of aspirin: Secondary | ICD-10-CM | POA: Diagnosis not present

## 2023-01-30 DIAGNOSIS — I2510 Atherosclerotic heart disease of native coronary artery without angina pectoris: Secondary | ICD-10-CM | POA: Diagnosis not present

## 2023-01-30 DIAGNOSIS — R5382 Chronic fatigue, unspecified: Secondary | ICD-10-CM | POA: Diagnosis not present

## 2023-01-30 DIAGNOSIS — E11319 Type 2 diabetes mellitus with unspecified diabetic retinopathy without macular edema: Secondary | ICD-10-CM | POA: Diagnosis not present

## 2023-01-30 DIAGNOSIS — I5022 Chronic systolic (congestive) heart failure: Secondary | ICD-10-CM

## 2023-01-30 DIAGNOSIS — Z79899 Other long term (current) drug therapy: Secondary | ICD-10-CM | POA: Insufficient documentation

## 2023-01-30 DIAGNOSIS — E785 Hyperlipidemia, unspecified: Secondary | ICD-10-CM | POA: Insufficient documentation

## 2023-01-30 DIAGNOSIS — Z01812 Encounter for preprocedural laboratory examination: Secondary | ICD-10-CM | POA: Diagnosis not present

## 2023-01-30 DIAGNOSIS — F17200 Nicotine dependence, unspecified, uncomplicated: Secondary | ICD-10-CM | POA: Diagnosis not present

## 2023-01-30 DIAGNOSIS — Z7984 Long term (current) use of oral hypoglycemic drugs: Secondary | ICD-10-CM | POA: Insufficient documentation

## 2023-01-30 DIAGNOSIS — Z95810 Presence of automatic (implantable) cardiac defibrillator: Secondary | ICD-10-CM | POA: Diagnosis not present

## 2023-01-30 HISTORY — DX: Presence of automatic (implantable) cardiac defibrillator: Z95.810

## 2023-01-30 LAB — COMPREHENSIVE METABOLIC PANEL
ALT: 13 U/L (ref 0–44)
AST: 18 U/L (ref 15–41)
Albumin: 4 g/dL (ref 3.5–5.0)
Alkaline Phosphatase: 74 U/L (ref 38–126)
Anion gap: 12 (ref 5–15)
BUN: 9 mg/dL (ref 6–20)
CO2: 21 mmol/L — ABNORMAL LOW (ref 22–32)
Calcium: 9.3 mg/dL (ref 8.9–10.3)
Chloride: 101 mmol/L (ref 98–111)
Creatinine, Ser: 1.05 mg/dL — ABNORMAL HIGH (ref 0.44–1.00)
GFR, Estimated: >60 mL/min (ref >60)
Glucose, Bld: 123 mg/dL — ABNORMAL HIGH (ref 70–99)
Potassium: 3.6 mmol/L (ref 3.5–5.1)
Sodium: 134 mmol/L — ABNORMAL LOW (ref 135–145)
Total Bilirubin: 1.3 mg/dL — ABNORMAL HIGH (ref 0.3–1.2)
Total Protein: 6.7 g/dL (ref 6.5–8.1)

## 2023-01-30 LAB — URINALYSIS, ROUTINE W REFLEX MICROSCOPIC
Bilirubin Urine: NEGATIVE
Glucose, UA: >=500 mg/dL — AB
Hgb urine dipstick: NEGATIVE
Ketones, ur: NEGATIVE mg/dL
Nitrite: NEGATIVE
Protein, ur: NEGATIVE mg/dL
Specific Gravity, Urine: 1.006 (ref 1.005–1.030)
pH: 6 (ref 5.0–8.0)

## 2023-01-30 LAB — CBC
HCT: 47.4 % — ABNORMAL HIGH (ref 36.0–46.0)
Hemoglobin: 15.8 g/dL — ABNORMAL HIGH (ref 12.0–15.0)
MCH: 29 pg (ref 26.0–34.0)
MCHC: 33.3 g/dL (ref 30.0–36.0)
MCV: 87 fL (ref 80.0–100.0)
Platelets: 239 10*3/uL (ref 150–400)
RBC: 5.45 MIL/uL — ABNORMAL HIGH (ref 3.87–5.11)
RDW: 12.5 % (ref 11.5–15.5)
WBC: 8.8 10*3/uL (ref 4.0–10.5)
nRBC: 0 % (ref 0.0–0.2)

## 2023-01-30 LAB — TYPE AND SCREEN
ABO/RH(D): A NEG
Antibody Screen: NEGATIVE

## 2023-01-30 LAB — GLUCOSE, CAPILLARY
Glucose-Capillary: 130 mg/dL — ABNORMAL HIGH (ref 70–99)
Glucose-Capillary: 119 mg/dL — ABNORMAL HIGH (ref 70–99)

## 2023-01-30 LAB — APTT: aPTT: 26 seconds (ref 24–36)

## 2023-01-30 LAB — ABO/RH: ABO/RH(D): A NEG

## 2023-01-30 LAB — PROTIME-INR
INR: 0.9 (ref 0.8–1.2)
Prothrombin Time: 12.2 seconds (ref 11.4–15.2)

## 2023-01-30 SURGERY — INSERTION, CAROTID SINUS BAROREFLEX ACTIVATION DEVICE
Anesthesia: General | Site: Neck | Laterality: Right

## 2023-01-30 MED ORDER — ACETAMINOPHEN 500 MG PO TABS: 1000.0000 mg | ORAL_TABLET | Freq: Once | ORAL | Status: DC | PRN

## 2023-01-30 MED ORDER — ONDANSETRON HCL 4 MG/2ML IJ SOLN
INTRAMUSCULAR | Status: DC | PRN
  Administered 2023-01-30: 4 mg via INTRAVENOUS

## 2023-01-30 MED ORDER — ACETAMINOPHEN 160 MG/5ML PO SOLN: 1000.0000 mg | Freq: Once | ORAL | Status: DC | PRN

## 2023-01-30 MED ORDER — PHENYLEPHRINE 80 MCG/ML (10ML) SYRINGE FOR IV PUSH (FOR BLOOD PRESSURE SUPPORT)
PREFILLED_SYRINGE | INTRAVENOUS | Status: AC
  Filled 2023-01-30: qty 10

## 2023-01-30 MED ORDER — PROPOFOL 10 MG/ML IV BOLUS
INTRAVENOUS | Status: DC | PRN
  Administered 2023-01-30: 100 mg via INTRAVENOUS

## 2023-01-30 MED ORDER — FENTANYL CITRATE (PF) 250 MCG/5ML IJ SOLN
INTRAMUSCULAR | Status: DC | PRN
  Administered 2023-01-30: 50 ug via INTRAVENOUS

## 2023-01-30 MED ORDER — ACETAMINOPHEN 10 MG/ML IV SOLN: 1000.0000 mg | Freq: Once | INTRAVENOUS | Status: DC | PRN

## 2023-01-30 MED ORDER — ONDANSETRON HCL 4 MG/2ML IJ SOLN
INTRAMUSCULAR | Status: AC
  Filled 2023-01-30: qty 2

## 2023-01-30 MED ORDER — OXYCODONE HCL 5 MG PO TABS: 5.0000 mg | ORAL_TABLET | Freq: Once | ORAL | Status: DC | PRN

## 2023-01-30 MED ORDER — ROCURONIUM BROMIDE 10 MG/ML (PF) SYRINGE
PREFILLED_SYRINGE | INTRAVENOUS | Status: AC
  Filled 2023-01-30: qty 10

## 2023-01-30 MED ORDER — LIDOCAINE HCL (PF) 1 % IJ SOLN
INTRAMUSCULAR | Status: AC
  Filled 2023-01-30: qty 10

## 2023-01-30 MED ORDER — FENTANYL CITRATE (PF) 100 MCG/2ML IJ SOLN: 25.0000 ug | INTRAMUSCULAR | Status: DC | PRN

## 2023-01-30 MED ORDER — CEFAZOLIN SODIUM-DEXTROSE 2-4 GM/100ML-% IV SOLN
2.0000 g | INTRAVENOUS | Status: AC
  Administered 2023-01-30: 2 g via INTRAVENOUS
  Filled 2023-01-30: qty 100

## 2023-01-30 MED ORDER — FENTANYL CITRATE (PF) 250 MCG/5ML IJ SOLN
INTRAMUSCULAR | Status: AC
  Filled 2023-01-30: qty 5

## 2023-01-30 MED ORDER — LIDOCAINE 2% (20 MG/ML) 5 ML SYRINGE
INTRAMUSCULAR | Status: AC
  Filled 2023-01-30: qty 5

## 2023-01-30 MED ORDER — CHLORHEXIDINE GLUCONATE CLOTH 2 % EX PADS: 6.0000 | MEDICATED_PAD | Freq: Once | CUTANEOUS | Status: DC

## 2023-01-30 MED ORDER — SODIUM CHLORIDE 0.9 % IV SOLN: INTRAVENOUS | Status: DC

## 2023-01-30 MED ORDER — SUGAMMADEX SODIUM 200 MG/2ML IV SOLN
INTRAVENOUS | Status: DC | PRN
  Administered 2023-01-30: 100 mg via INTRAVENOUS
  Administered 2023-01-30: 200 mg via INTRAVENOUS

## 2023-01-30 MED ORDER — OXYCODONE HCL 5 MG/5ML PO SOLN: 5.0000 mg | Freq: Once | ORAL | Status: DC | PRN

## 2023-01-30 MED ORDER — SODIUM CHLORIDE 0.9 % IV SOLN
0.1500 ug/kg/min | INTRAVENOUS | Status: AC
  Administered 2023-01-30: .1 ug/kg/min via INTRAVENOUS
  Filled 2023-01-30: qty 2000

## 2023-01-30 MED ORDER — PHENYLEPHRINE 80 MCG/ML (10ML) SYRINGE FOR IV PUSH (FOR BLOOD PRESSURE SUPPORT)
PREFILLED_SYRINGE | INTRAVENOUS | Status: DC | PRN
  Administered 2023-01-30 (×2): 80 ug via INTRAVENOUS

## 2023-01-30 MED ORDER — PHENYLEPHRINE HCL-NACL 20-0.9 MG/250ML-% IV SOLN
INTRAVENOUS | Status: DC | PRN
  Administered 2023-01-30: 40 ug/min via INTRAVENOUS

## 2023-01-30 MED ORDER — INSULIN ASPART 100 UNIT/ML IJ SOLN: 0.0000 [IU] | INTRAMUSCULAR | Status: DC | PRN

## 2023-01-30 MED ORDER — OXYCODONE HCL 5 MG PO TABS
5.0000 mg | ORAL_TABLET | Freq: Four times a day (QID) | ORAL | 0 refills | Status: DC | PRN
  Filled 2023-01-30: qty 10, 3d supply, fill #0

## 2023-01-30 MED ORDER — 0.9 % SODIUM CHLORIDE (POUR BTL) OPTIME
TOPICAL | Status: DC | PRN
  Administered 2023-01-30: 1000 mL

## 2023-01-30 MED ORDER — SODIUM CHLORIDE 0.9 % IV SOLN: INTRAVENOUS | Status: DC | PRN

## 2023-01-30 MED ORDER — MIDAZOLAM HCL 2 MG/2ML IJ SOLN
INTRAMUSCULAR | Status: AC
  Filled 2023-01-30: qty 2

## 2023-01-30 MED ORDER — MIDAZOLAM HCL 2 MG/2ML IJ SOLN
INTRAMUSCULAR | Status: DC | PRN
  Administered 2023-01-30: 2 mg via INTRAVENOUS

## 2023-01-30 MED ORDER — AMISULPRIDE (ANTIEMETIC) 5 MG/2ML IV SOLN
INTRAVENOUS | Status: AC
  Filled 2023-01-30: qty 4

## 2023-01-30 MED ORDER — ROCURONIUM BROMIDE 10 MG/ML (PF) SYRINGE
PREFILLED_SYRINGE | INTRAVENOUS | Status: DC | PRN
  Administered 2023-01-30: 60 mg via INTRAVENOUS

## 2023-01-30 MED ORDER — ORAL CARE MOUTH RINSE: 15.0000 mL | Freq: Once | OROMUCOSAL | Status: AC

## 2023-01-30 MED ORDER — BUPIVACAINE HCL (PF) 0.5 % IJ SOLN
INTRAMUSCULAR | Status: AC
  Filled 2023-01-30: qty 30

## 2023-01-30 MED ORDER — AMISULPRIDE (ANTIEMETIC) 5 MG/2ML IV SOLN
10.0000 mg | Freq: Once | INTRAVENOUS | Status: AC
  Administered 2023-01-30: 10 mg via INTRAVENOUS

## 2023-01-30 MED ORDER — LACTATED RINGERS IV SOLN: INTRAVENOUS | Status: DC

## 2023-01-30 MED ORDER — CHLORHEXIDINE GLUCONATE 0.12 % MT SOLN
15.0000 mL | Freq: Once | OROMUCOSAL | Status: AC
  Administered 2023-01-30: 15 mL via OROMUCOSAL
  Filled 2023-01-30: qty 15

## 2023-01-30 MED ORDER — PROPOFOL 10 MG/ML IV BOLUS
INTRAVENOUS | Status: AC
  Filled 2023-01-30: qty 20

## 2023-01-30 MED ORDER — LIDOCAINE 2% (20 MG/ML) 5 ML SYRINGE
INTRAMUSCULAR | Status: DC | PRN
  Administered 2023-01-30: 100 mg via INTRAVENOUS

## 2023-01-30 SURGICAL SUPPLY — 39 items
ADH SKN CLS APL DERMABOND .7 (GAUZE/BANDAGES/DRESSINGS) ×1
APL PRP STRL LF DISP 70% ISPRP (MISCELLANEOUS) ×1
BAG COUNTER SPONGE SURGICOUNT (BAG) ×1 IMPLANT
BAG SPNG CNTER NS LX DISP (BAG) ×1
CANISTER SUCT 3000ML PPV (MISCELLANEOUS) ×1 IMPLANT
CHLORAPREP W/TINT 26 (MISCELLANEOUS) ×1 IMPLANT
CLIP TI MEDIUM 6 (CLIP) IMPLANT
CLIP TI WIDE RED SMALL 6 (CLIP) IMPLANT
COVER PROBE W GEL 5X96 (DRAPES) ×1 IMPLANT
DERMABOND ADVANCED .7 DNX12 (GAUZE/BANDAGES/DRESSINGS) ×1 IMPLANT
ELECT REM PT RETURN 9FT ADLT (ELECTROSURGICAL) ×1
ELECTRODE REM PT RTRN 9FT ADLT (ELECTROSURGICAL) ×1 IMPLANT
GLOVE SURG SS PI 7.5 STRL IVOR (GLOVE) ×1 IMPLANT
GOWN STRL REUS W/ TWL LRG LVL3 (GOWN DISPOSABLE) ×2 IMPLANT
GOWN STRL REUS W/ TWL XL LVL3 (GOWN DISPOSABLE) ×1 IMPLANT
GOWN STRL REUS W/TWL LRG LVL3 (GOWN DISPOSABLE) ×2
GOWN STRL REUS W/TWL XL LVL3 (GOWN DISPOSABLE) ×1
HEMOSTAT SNOW SURGICEL 2X4 (HEMOSTASIS) IMPLANT
KIT BASIN OR (CUSTOM PROCEDURE TRAY) ×1 IMPLANT
KIT TURNOVER KIT B (KITS) ×1 IMPLANT
MARKER SKIN DUAL TIP RULER LAB (MISCELLANEOUS) ×1 IMPLANT
NDL 18GX1X1/2 (RX/OR ONLY) (NEEDLE) ×1 IMPLANT
NEEDLE 18GX1X1/2 (RX/OR ONLY) (NEEDLE) ×1 IMPLANT
NS IRRIG 1000ML POUR BTL (IV SOLUTION) ×2 IMPLANT
PACK CAROTID (CUSTOM PROCEDURE TRAY) ×1 IMPLANT
PAD ARMBOARD 7.5X6 YLW CONV (MISCELLANEOUS) ×2 IMPLANT
POSITIONER HEAD DONUT 9IN (MISCELLANEOUS) ×1 IMPLANT
SUT ETHIBOND CT1 BRD #0 30IN (SUTURE) ×2 IMPLANT
SUT ETHILON 3 0 PS 1 (SUTURE) IMPLANT
SUT PROLENE 6 0 BV (SUTURE) ×8 IMPLANT
SUT SILK 0 FSL (SUTURE) IMPLANT
SUT VIC AB 3-0 SH 27 (SUTURE) ×2
SUT VIC AB 3-0 SH 27X BRD (SUTURE) ×2 IMPLANT
SUT VICRYL 4-0 PS2 18IN ABS (SUTURE) ×2 IMPLANT
SYR 5ML LL (SYRINGE) ×1 IMPLANT
SYR BULB IRRIG 60ML STRL (SYRINGE) ×1 IMPLANT
SYSTEM IPG BAROSTIM 2104 (Generator) IMPLANT
TOWEL GREEN STERILE (TOWEL DISPOSABLE) ×1 IMPLANT
WATER STERILE IRR 1000ML POUR (IV SOLUTION) ×1 IMPLANT

## 2023-01-30 NOTE — Anesthesia Procedure Notes (Addendum)
Procedure Name: Intubation Date/Time: 01/30/2023 7:47 AM  Performed by: Sharyn Dross, CRNAPre-anesthesia Checklist: Patient identified, Emergency Drugs available, Suction available and Patient being monitored Patient Re-evaluated:Patient Re-evaluated prior to induction Oxygen Delivery Method: Circle system utilized Preoxygenation: Pre-oxygenation with 100% oxygen Induction Type: IV induction Ventilation: Mask ventilation without difficulty and Oral airway inserted - appropriate to patient size Laryngoscope Size: Mac and 4 Grade View: Grade I Tube type: Oral Tube size: 7.0 mm Number of attempts: 1 Airway Equipment and Method: Stylet and Oral airway Placement Confirmation: ETT inserted through vocal cords under direct vision, positive ETCO2 and breath sounds checked- equal and bilateral Secured at: 22 cm Tube secured with: Tape Dental Injury: Teeth and Oropharynx as per pre-operative assessment  Comments: Performed by EMT student

## 2023-01-30 NOTE — H&P (Signed)
[facility] [place]   Patient name: Shelly [name]         MRN: [id number]        DOB: 1970-09-08            Sex: female     REQUESTING PROVIDER:     Dr. Gala Romney     REASON FOR CONSULT:    Barostim evaluation   HISTORY OF PRESENT ILLNESS:    Shelly [name] is a 52 y.o. female, who is here today for Barostim evaluation.  The patient suffers from NYHA class II heart failure.  Heart catheterization shows diffuse nonobstructive coronary artery disease with chronic dissection of the mid LAD.  Echocardiogram in March 2023 shows an ejection fraction of 20 to 25%.  The patient had a ICD placed in June 2023.  She has chronic fatigue with activity despite goal-directed medical therapy.   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  She is a current smoker.  She is on Repatha for hyperlipidemia   PAST MEDICAL HISTORY          Past Medical History:  Diagnosis Date   Anemia     Anxiety     Arthritis     Cataract      Mixed OU   CHF (congestive heart failure) (HCC)     Coronary artery disease     Diabetes mellitus without complication (HCC)      type 2   Diabetic retinopathy (HCC)      PDR OU   Dizziness     Headache      Cluster headaches in the past   History of kidney stones      "2 sitting"   Hyperlipidemia     Hypertension     Hypertensive retinopathy      OU   Myocardial infarction (HCC)     Pneumonia     Syncope     Tobacco abuse              FAMILY HISTORY         Family History  Problem Relation Age of Onset   Sudden Cardiac Death Mother     CAD Father     CAD Paternal Grandmother     CAD Paternal Grandfather            SOCIAL HISTORY:    Social History         Socioeconomic History   Marital status: Married      Spouse name: Not on file   Number of children: Not on file   Years of education: Not on file   Highest education level: Not on file  Occupational History    Not on file  Tobacco Use   Smoking status: Every Day      Packs/day: 1.50      Types: Cigarettes   Smokeless tobacco: Never  Vaping Use   Vaping Use: Never used  Substance and Sexual Activity   Alcohol use: Not Currently   Drug use: Never   Sexual activity: Not on file  Other Topics Concern   Not on file  Social History Narrative   Not on file    Social Determinants of Health    Financial Resource Strain: Not on file  Food Insecurity: Not on file  Transportation Needs: Not on file  Physical Activity: Not on file  Stress: Not on file  Social Connections: Not on file  Intimate Partner Violence: Not on file      ALLERGIES:      Allergies       Allergies  Allergen Reactions   Atorvastatin        diarrhea   Rosuvastatin        diarrhea        CURRENT MEDICATIONS:            Current Outpatient Medications  Medication Sig Dispense Refill   acetaminophen (TYLENOL) 500 MG tablet Take 1,000 mg by mouth every 6 (six) hours as needed for moderate pain or headache.       aspirin 81 MG chewable tablet Chew 81 mg by mouth at bedtime.       Aspirin-Caffeine (BAYER BACK & BODY PO) Take 2 tablets by mouth every 6 (six) hours as needed (pain).       carvedilol (COREG) 3.125 MG tablet Take 1 tablet (3.125 mg total) by mouth 2 (two) times daily. 180 tablet 2   cholecalciferol (VITAMIN D) 25 MCG (1000 UNIT) tablet Take 1,000 Units by mouth every evening.       Continuous Blood Gluc Receiver (FREESTYLE LIBRE 2 READER) DEVI Use as directed 1 each 0   Continuous Blood Gluc Sensor (FREESTYLE LIBRE 2 SENSOR) MISC Use as directed 1 each 3   dapagliflozin propanediol (FARXIGA) 10 MG TABS tablet Take 1 tablet (10 mg total) by mouth daily. 90 tablet 3   Dulaglutide (TRULICITY) 3 MG/0.5ML SOPN Inject 3 mg into the skin once a week. 6 mL 0   Evolocumab (REPATHA SURECLICK) 140 MG/ML SOAJ Inject 1 Dose into the skin every 14 (fourteen) days. 2 mL 11   furosemide (LASIX) 20 MG tablet Take 1  tablet (20 mg total) by mouth 3 (three) times a week. 30 tablet 3   ibuprofen (ADVIL) 200 MG tablet Take 400 mg by mouth every 6 (six) hours as needed for headache or moderate pain.       KLOR-CON M10 10 MEQ tablet TAKE 2 TABLETS BY MOUTH DAILY 180 tablet 3   losartan (COZAAR) 25 MG tablet Take 1 tablet (25 mg total) by mouth at bedtime. 90 tablet 3   Phenylephrine-Acetaminophen (SINUS CONGESTION/PAIN PO) Take 2 tablets by mouth every 6 (six) hours as needed (sinus).       spironolactone (ALDACTONE) 25 MG tablet Take 1 tablet (25 mg total) by mouth daily. 90 tablet 3      No current facility-administered medications for this visit.        REVIEW OF SYSTEMS:    [X]  denotes positive finding, [ ]  denotes negative finding Cardiac   Comments:  Chest pain or chest pressure:      Shortness of breath upon exertion:      Short of breath when lying flat:      Irregular heart rhythm:             Vascular      Pain in calf, thigh, or hip brought on by ambulation:      Pain in feet at night that wakes you up from your sleep:       Blood clot in your veins:      Leg swelling:              Pulmonary      Oxygen at home:  Productive cough:       Wheezing:              Neurologic      Sudden weakness in arms or legs:       Sudden numbness in arms or legs:       Sudden onset of difficulty speaking or slurred speech:      Temporary loss of vision in one eye:       Problems with dizziness:              Gastrointestinal      Blood in stool:         Vomited blood:              Genitourinary      Burning when urinating:       Blood in urine:             Psychiatric      Major depression:              Hematologic      Bleeding problems:      Problems with blood clotting too easily:             Skin      Rashes or ulcers:             Constitutional      Fever or chills:        PHYSICAL EXAM:    There were no vitals filed for this visit.   GENERAL: The patient is a  well-nourished female, in no acute distress. The vital signs are documented above. CARDIAC: There is a regular rate and rhythm.  VASCULAR: SonoSite was used to evaluate her carotid bifurcation which is slightly above the hyoid PULMONARY: Nonlabored respirations MUSCULOSKELETAL: There are no major deformities or cyanosis. NEUROLOGIC: No focal weakness or paresthesias are detected. SKIN: There are no ulcers or rashes noted. PSYCHIATRIC: The patient has a normal affect.   STUDIES:    I have reviewed her carotid duplex from February 2023 with the following findings: Right Carotid: Velocities in the right ICA are consistent with a 1-39%  stenosis.   Left Carotid: Velocities in the left ICA are consistent with a 1-39%  stenosis.   Vertebrals:  Bilateral vertebral arteries demonstrate antegrade flow.  Subclavians: Normal flow hemodynamics were seen in bilateral subclavian               arteries.   ASSESSMENT and PLAN    NYHA class II-III heart failure: I discussed with the patient that I feel she is a excellent candidate for Barostim therapy.  I discussed the details of surgery as well as the risks and benefits including infection and bleeding.  All of her questions were answered.  She is here today with her son.  She is eager to get this done because of her low level of energy.  We will work on Therapist, occupational and get her scheduled.     Charlena Cross, MD, FACS [facility] [phone] Pager [phone]       Patient has changed insurance companies and is now approved for Barostim.  She was seen in heart failure clinic yesterday.  She has chronic intra-cardiac thrombus and will start Eliquis Monday/  She continues to del with low energy and fatigue.  I discussed surgery details again with patient and her son.  She is eager to proceed \ [name]

## 2023-01-30 NOTE — Interval H&P Note (Signed)
History and Physical Interval Note:  01/30/2023 7:33 AM  Shelly [name]  has presented today for surgery, with the diagnosis of Congestive heart failure.  The various methods of treatment have been discussed with the patient and family. After consideration of risks, benefits and other options for treatment, the patient has consented to  Procedure(s): INSERTION OF BAROSTIM (Right) as a surgical intervention.  The patient's history has been reviewed, patient examined, no change in status, stable for surgery.  I have reviewed the patient's chart and labs.  Questions were answered to the patient's satisfaction.     [name]

## 2023-01-30 NOTE — Transfer of Care (Signed)
Immediate Anesthesia Transfer of Care Note  Patient: Shelly [name]  Procedure(s) Performed: INSERTION OF Alexia Freestone (Right: Neck)  Patient Location: PACU  Anesthesia Type:General  Level of Consciousness: awake, drowsy, and patient cooperative  Airway & Oxygen Therapy: Patient connected to face mask oxygen  Post-op Assessment: Report given to RN, Post -op Vital signs reviewed and stable, and Patient moving all extremities X 4  Post vital signs: Reviewed and stable  Last Vitals:  Vitals Value Taken Time  BP 126/60 01/30/23 0918  Temp    Pulse 76 01/30/23 0919  Resp 20 01/30/23 0919  SpO2 99 % 01/30/23 0919  Vitals shown include unfiled device data.  Last Pain:  Vitals:   01/30/23 0612  TempSrc:   PainSc: 0-No pain         Complications: There were no known notable events for this encounter.

## 2023-01-30 NOTE — Anesthesia Procedure Notes (Addendum)
Arterial Line Insertion Start/End8/23/2024 7:19 AM, 01/30/2023 7:24 AM Performed by: Val Eagle, MD, Sharyn Dross, CRNA, CRNA  Patient location: OOR procedure area. Preanesthetic checklist: patient identified, IV checked, site marked, risks and benefits discussed, surgical consent, monitors and equipment checked, pre-op evaluation, timeout performed and anesthesia consent Lidocaine 1% used for infiltration Right, radial was placed Catheter size: 20 G Hand hygiene performed , maximum sterile barriers used  and Seldinger technique used Allen's test indicative of satisfactory collateral circulation Attempts: 1 Procedure performed without using ultrasound guided technique. Following insertion, dressing applied and Biopatch. Post procedure assessment: normal and unchanged  Patient tolerated the procedure well with no immediate complications.

## 2023-01-30 NOTE — Op Note (Signed)
    Patient name: Shelly [name] MRN: [id number] DOB: 1971/05/17 Sex: female  01/30/2023 Pre-operative Diagnosis: NYHA class III heart failure Post-operative diagnosis:  Same Surgeon:  [name] Assistants:  Antony Blackbird, PA Procedure:   Insertion of right sided Barostim device Anesthesia:  General Blood Loss:  minimal Specimens:  none  Findings:  device at position "A" Device serial number: [id number], model 07/10/2002 System: Serial #[id number], model 1036  Indications: This is a 52 year old female with NYHA class III heart failure on goal-directed medical therapy with persistent symptoms who comes in today for Barostim device implant.  Procedure:  The patient was identified in the holding area and taken to [facility] OR ROOM 11  The patient was then placed supine on the table. general anesthesia was administered.  The patient was prepped and draped in the usual sterile fashion.  A time out was called and antibiotics were administered.  A PA was necessary to explain the procedure and assist with technical details.  She help with exposure by providing suction and retraction.  She help with the suture placement by following the suture.  She help with wound closure.  Ultrasound was used to evaluate and locate the carotid bifurcation.  A transverse 3 cm incision was made at this level.  Cautery was used to divide the subcutaneous tissue and platysma muscle.  I then dissected out the facial vein which was ligated between silk ties.  I then exposed the common carotid artery as well as the carotid bifurcation.  I had to divide the ansa in order to get better exposure and make sure the hypoglossal nerve was not affected by the electrode.  Attention was turned towards the infraclavicular space on the right.  A transverse incision was made 1 fingerbreadth below the clavicle.  Cautery and blunt dissection were used to create a pocket.  I then used a tonsil clamp to create a tunnel between the 2 incisions.  The  device was then brought through the tunnel and connected to the IPG.  The IPG was placed in the pocket and we began device testing.  We had a excellent hemodynamic response at position "A".  The electrode was then sutured into position with six 6-0 Prolene sutures.  Two 6-0 Prolene sutures were used for the strain relief loop tacking to the common carotid adventitia.  The wounds were then irrigated with antibiotic solution.  Hemostasis was achieved.  The IPG was then secured to the pectoral fascia with 2 Ethibond to have sutures.  The neck incision was closed by reapproximating the platysma muscle with 3-0 Vicryl and the skin with 4-0 Vicryl.  The infraclavicular incision was closed by reapproximating the subcutaneous tissue with 3-0 Vicryl and the skin with 4-0 Vicryl.  Dermabond was placed on the incisions.  The patient was successfully extubated taken recovery in stable condition.  There were no immediate complications.   Disposition: To PACU stable   V. [name], M.D., [facility] [place] Office: [phone] Pager:  [phone]

## 2023-02-03 NOTE — Anesthesia Postprocedure Evaluation (Signed)
Anesthesia Post Note  Patient: Shelly [name]  Procedure(s) Performed: INSERTION OF Alexia Freestone (Right: Neck)     Patient location during evaluation: PACU Anesthesia Type: General Level of consciousness: awake and alert Pain management: pain level controlled Vital Signs Assessment: post-procedure vital signs reviewed and stable Respiratory status: spontaneous breathing, nonlabored ventilation, respiratory function stable and patient connected to nasal cannula oxygen Cardiovascular status: blood pressure returned to baseline and stable Postop Assessment: no apparent nausea or vomiting Anesthetic complications: no   There were no known notable events for this encounter.  Last Vitals:  Vitals:   01/30/23 1015 01/30/23 1030  BP: 103/66 96/60  Pulse: 70 70  Resp: (!) 22 (!) 25  Temp:  36.6 C  SpO2: 95% 94%    Last Pain:  Vitals:   01/30/23 1030  TempSrc:   PainSc: 3                  Oliwia Berzins

## 2023-02-05 ENCOUNTER — Emergency Department (HOSPITAL_BASED_OUTPATIENT_CLINIC_OR_DEPARTMENT_OTHER): Payer: Medicare Other

## 2023-02-05 ENCOUNTER — Telehealth: Payer: Self-pay

## 2023-02-05 ENCOUNTER — Encounter (HOSPITAL_BASED_OUTPATIENT_CLINIC_OR_DEPARTMENT_OTHER): Payer: Self-pay

## 2023-02-05 ENCOUNTER — Emergency Department (HOSPITAL_BASED_OUTPATIENT_CLINIC_OR_DEPARTMENT_OTHER)
Admission: EM | Admit: 2023-02-05 | Discharge: 2023-02-06 | Disposition: A | Discharge status: Home / Self Care | Payer: Medicare Other | Attending: Emergency Medicine | Admitting: Emergency Medicine

## 2023-02-05 ENCOUNTER — Telehealth (HOSPITAL_COMMUNITY): Payer: Self-pay | Admitting: Cardiology

## 2023-02-05 ENCOUNTER — Emergency Department (HOSPITAL_COMMUNITY)
Admission: EM | Admit: 2023-02-05 | Discharge: 2023-02-05 | Discharge status: Against Medical Advice | Payer: Medicare Other

## 2023-02-05 ENCOUNTER — Other Ambulatory Visit: Payer: Self-pay

## 2023-02-05 DIAGNOSIS — Z95810 Presence of automatic (implantable) cardiac defibrillator: Secondary | ICD-10-CM | POA: Diagnosis not present

## 2023-02-05 DIAGNOSIS — I2510 Atherosclerotic heart disease of native coronary artery without angina pectoris: Secondary | ICD-10-CM | POA: Insufficient documentation

## 2023-02-05 DIAGNOSIS — R062 Wheezing: Secondary | ICD-10-CM | POA: Insufficient documentation

## 2023-02-05 DIAGNOSIS — M542 Cervicalgia: Secondary | ICD-10-CM | POA: Diagnosis present

## 2023-02-05 DIAGNOSIS — G8918 Other acute postprocedural pain: Secondary | ICD-10-CM | POA: Diagnosis not present

## 2023-02-05 DIAGNOSIS — Z79899 Other long term (current) drug therapy: Secondary | ICD-10-CM | POA: Diagnosis not present

## 2023-02-05 DIAGNOSIS — I509 Heart failure, unspecified: Secondary | ICD-10-CM | POA: Insufficient documentation

## 2023-02-05 DIAGNOSIS — Z7901 Long term (current) use of anticoagulants: Secondary | ICD-10-CM | POA: Insufficient documentation

## 2023-02-05 LAB — CBC WITH DIFFERENTIAL/PLATELET
Abs Immature Granulocytes: 0.03 10*3/uL (ref 0.00–0.07)
Basophils Absolute: 0.1 10*3/uL (ref 0.0–0.1)
Basophils Relative: 1 %
Eosinophils Absolute: 0.7 10*3/uL — ABNORMAL HIGH (ref 0.0–0.5)
Eosinophils Relative: 7 %
HCT: 44.4 % (ref 36.0–46.0)
Hemoglobin: 14.7 g/dL (ref 12.0–15.0)
Immature Granulocytes: 0 %
Lymphocytes Relative: 32 %
Lymphs Abs: 3.2 10*3/uL (ref 0.7–4.0)
MCH: 28.5 pg (ref 26.0–34.0)
MCHC: 33.1 g/dL (ref 30.0–36.0)
MCV: 86 fL (ref 80.0–100.0)
Monocytes Absolute: 0.7 10*3/uL (ref 0.1–1.0)
Monocytes Relative: 7 %
Neutro Abs: 5.4 10*3/uL (ref 1.7–7.7)
Neutrophils Relative %: 53 %
Platelets: 292 10*3/uL (ref 150–400)
RBC: 5.16 MIL/uL — ABNORMAL HIGH (ref 3.87–5.11)
RDW: 12.4 % (ref 11.5–15.5)
WBC: 10 10*3/uL (ref 4.0–10.5)
nRBC: 0 % (ref 0.0–0.2)

## 2023-02-05 LAB — BASIC METABOLIC PANEL
Anion gap: 9 (ref 5–15)
BUN: 11 mg/dL (ref 6–20)
CO2: 28 mmol/L (ref 22–32)
Calcium: 9.2 mg/dL (ref 8.9–10.3)
Chloride: 100 mmol/L (ref 98–111)
Creatinine, Ser: 1.13 mg/dL — ABNORMAL HIGH (ref 0.44–1.00)
GFR, Estimated: 58 mL/min — ABNORMAL LOW (ref >60)
Glucose, Bld: 121 mg/dL — ABNORMAL HIGH (ref 70–99)
Potassium: 3.8 mmol/L (ref 3.5–5.1)
Sodium: 137 mmol/L (ref 135–145)

## 2023-02-05 MED ORDER — ACETAMINOPHEN 325 MG PO TABS
650.0000 mg | ORAL_TABLET | Freq: Once | ORAL | Status: AC
  Administered 2023-02-05: 650 mg via ORAL
  Filled 2023-02-05: qty 2

## 2023-02-05 MED ORDER — IOHEXOL 300 MG/ML  SOLN
75.0000 mL | Freq: Once | INTRAMUSCULAR | Status: AC | PRN
  Administered 2023-02-05: 75 mL via INTRAVENOUS

## 2023-02-05 MED ORDER — ALBUTEROL SULFATE HFA 108 (90 BASE) MCG/ACT IN AERS
2.0000 | INHALATION_SPRAY | RESPIRATORY_TRACT | Status: DC | PRN
  Filled 2023-02-05: qty 6.7

## 2023-02-05 NOTE — Telephone Encounter (Signed)
Patient called to report pain and swelling at incision site S/P barostim 01/30/23. Reports pain in neck as well.   Reports she initially contacted [facility] and was told to follow up with cardiology.   Advised would start telephone encounter on her behalf and forward to surgeons office and (?[facility] follow up with A Tillery,PA) Routinely complications post surgery are managed by surgery  Pt appreciative of assistance .

## 2023-02-05 NOTE — ED Notes (Signed)
Patient transported to CT 

## 2023-02-05 NOTE — ED Provider Notes (Signed)
[facility]  Provider Note  CSN: [id number] Arrival date & time: 02/05/23 2000  History Chief Complaint  Patient presents with   Neck Pain    Shelly [name] is a 52 y.o. female with history of CAD, CHF, AICD on eliquis recently had a Barostim device placed about a week ago. Since then she has had some swelling and soreness at the carotid incision site. Some difficulty swallowing when she wakes up in the morning but that resolves later in the day. Advised by her surgeon to come to the ED to rule out hematoma, initially went to [facility] but left and came here due to wait times.    Home Medications Prior to Admission medications   Medication Sig Start Date End Date Taking? Authorizing Provider  albuterol (VENTOLIN HFA) 108 (90 Base) MCG/ACT inhaler Inhale 2 puffs into the lungs every 4 - 6 hours as needed for wheezing. 07/22/22     apixaban (ELIQUIS) 5 MG TABS tablet Take 1 tablet (5 mg total) by mouth 2 (two) times daily. 02/02/23   Bensimhon, Bevelyn Buckles, MD  carvedilol (COREG) 3.125 MG tablet Take 1 tablet (3.125 mg total) by mouth 2 (two) times daily. 06/10/22   Bensimhon, Bevelyn Buckles, MD  cholecalciferol (VITAMIN D) 25 MCG (1000 UNIT) tablet Take 1,000 Units by mouth in the morning.    [provider]  dapagliflozin propanediol (FARXIGA) 10 MG TABS tablet Take 1 tablet (10 mg total) by mouth daily. 09/09/22   Milford, Anderson Malta, FNP  diclofenac Sodium (GOODSENSE ARTHRITIS PAIN) 1 % GEL Apply 2 g topically 4 (four) times daily for 7 days to single elbow, wrist or hand; for hand includes palm/fingers/back of hand. Patient not taking: Reported on 01/29/2023 01/19/23     Dulaglutide (TRULICITY) 3 MG/0.5ML SOPN Inject 3 mg into the skin once a week. 12/08/22     Evolocumab (REPATHA SURECLICK) 140 MG/ML SOAJ Inject 1 Dose into the skin every 14 (fourteen) days. 08/25/22   Hilty, Lisette Abu, MD  furosemide (LASIX) 20 MG tablet Take 1 tablet (20 mg total) by  mouth 3 (three) times a week. 12/01/22   Bensimhon, Bevelyn Buckles, MD  ibuprofen (ADVIL) 200 MG tablet Take 400 mg by mouth every 6 (six) hours as needed for headache or moderate pain.    [provider]  losartan (COZAAR) 25 MG tablet Take 1 tablet (25 mg total) by mouth at bedtime. 01/16/23   Bensimhon, Bevelyn Buckles, MD  Melatonin 10 MG TABS Take 10 mg by mouth at bedtime as needed (sleep.).    [provider]  ondansetron (ZOFRAN) 4 MG tablet Take 1 tablet (4 mg total) by mouth every 8 (eight) hours as needed for nausea or vomiting. 08/17/22   Wynetta Fines, MD  oxyCODONE (ROXICODONE) 5 MG immediate release tablet Take 1 tablet (5 mg total) by mouth every 6 (six) hours as needed for severe pain. 01/30/23   Schuh, McKenzi P, PA-C  Phenylephrine-Acetaminophen (SINUS CONGESTION/PAIN PO) Take 2 tablets by mouth every 6 (six) hours as needed (sinus).    [provider]  rOPINIRole (REQUIP) 0.5 MG tablet Take 0.5 mg by mouth at bedtime as needed.    [provider]  spironolactone (ALDACTONE) 25 MG tablet Take 1 tablet (25 mg total) by mouth daily. 09/09/22   Bensimhon, Bevelyn Buckles, MD     Allergies    Crestor [rosuvastatin] and Lipitor [atorvastatin]   Review of Systems   Review of  Systems Please see HPI for pertinent positives and negatives  Physical Exam BP 130/81 (BP Location: Left Arm)   Pulse 88   Temp 98 F (36.7 C) (Oral)   Resp 20   Ht 5\' 8"  (1.727 m)   Wt 88 kg   LMP  (LMP Unknown)   SpO2 97%   BMI 29.50 kg/m   Physical Exam Vitals and nursing note reviewed.  Constitutional:      Appearance: Normal appearance.  HENT:     Head: Normocephalic and atraumatic.     Nose: Nose normal.     Mouth/Throat:     Mouth: Mucous membranes are moist.  Eyes:     Extraocular Movements: Extraocular movements intact.     Conjunctiva/sclera: Conjunctivae normal.  Neck:     Comments: There is some soft tissue swelling and tenderness at her R neck incision. Not  pulsatile.  Cardiovascular:     Rate and Rhythm: Normal rate.  Pulmonary:     Effort: Pulmonary effort is normal.     Breath sounds: Wheezing present.  Abdominal:     General: Abdomen is flat.     Palpations: Abdomen is soft.     Tenderness: There is no abdominal tenderness.  Musculoskeletal:        General: No swelling. Normal range of motion.     Cervical back: Neck supple.  Skin:    General: Skin is warm and dry.  Neurological:     General: No focal deficit present.     Mental Status: She is alert.  Psychiatric:        Mood and Affect: Mood normal.     ED Results / Procedures / Treatments   EKG None  Procedures Procedures  Medications Ordered in the ED Medications  albuterol (VENTOLIN HFA) 108 (90 Base) MCG/ACT inhaler 2 puff (has no administration in time range)  acetaminophen (TYLENOL) tablet 650 mg (650 mg Oral Given 02/05/23 2312)  iohexol (OMNIPAQUE) 300 MG/ML solution 75 mL (75 mLs Intravenous Contrast Given 02/05/23 2350)    Initial Impression and Plan  Patient here with post-op swelling and some difficulty swallowing after Barostim placement about a week ago. She was advised by surgeons to come to the ED to rule out hematoma. Will check labs and send for CT. She requests APAP for pain. Some wheezing on exam she reports is due to crying, Albuterol ordered.   ED Course   Clinical Course as of 02/06/23 0101  Thu Feb 05, 2023  2325 CBC is unremarkable.  [CS]  2337 BMP is unremarkable.  [CS]  Fri Feb 06, 2023  1610 I personally viewed the images from radiology studies and agree with radiologist interpretation: CT negative for drainable fluid collection or hematoma. Patient has pain medication at home. Recommend routine post-op care and follow up with her surgeon. RTED for any other concerns.   [CS]    Clinical Course User Index [CS] Pollyann Savoy, MD     MDM Rules/Calculators/A&P Medical Decision Making Problems Addressed: Post-operative pain: acute  illness or injury  Amount and/or Complexity of Data Reviewed Labs: ordered. Decision-making details documented in ED Course. Radiology: ordered and independent interpretation performed. Decision-making details documented in ED Course.  Risk OTC drugs. Prescription drug management.     Final Clinical Impression(s) / ED Diagnoses Final diagnoses:  Post-operative pain    Rx / DC Orders ED Discharge Orders     None        Pollyann Savoy, MD 02/06/23 [phone]

## 2023-02-05 NOTE — ED Notes (Signed)
Pt called multiple times no answer 

## 2023-02-05 NOTE — Telephone Encounter (Signed)
Caller: Patient  Concern: Throat swollen, difficulty swallowing, neck swelling, "feels like a pill is stuck in throat", hard firm knot under skin at neck  Pt denies difficulty breathing  Location: neck, chest  Description:  significant pain starting Monday, 8/26, but no worsening since  Procedure:  Barostim  Resolution: Instructed patient to proceed to nearest ER to eval for hematoma, pt going to [facility]

## 2023-02-05 NOTE — ED Triage Notes (Addendum)
Pt had a Barostim heart failure device implanted on Friday Pt concerned about the pain and increased swelling. Has a small knot at surgical site Surgeon requested she come here for evaluation

## 2023-02-11 ENCOUNTER — Other Ambulatory Visit (HOSPITAL_COMMUNITY): Payer: Self-pay

## 2023-02-20 ENCOUNTER — Ambulatory Visit (INDEPENDENT_AMBULATORY_CARE_PROVIDER_SITE_OTHER): Payer: Medicare Other

## 2023-02-20 ENCOUNTER — Other Ambulatory Visit (HOSPITAL_COMMUNITY): Payer: Self-pay

## 2023-02-20 DIAGNOSIS — I255 Ischemic cardiomyopathy: Secondary | ICD-10-CM | POA: Diagnosis not present

## 2023-02-20 LAB — CUP PACEART REMOTE DEVICE CHECK
Battery Remaining Longevity: 168 mo
Battery Remaining Percentage: 100 %
Brady Statistic RV Percent Paced: 0 %
Date Time Interrogation Session: 20240913002000
HighPow Impedance: 85 Ohm
Implantable Lead Connection Status: 753985
Implantable Lead Implant Date: 20230615
Implantable Lead Location: 753860
Implantable Lead Model: 137
Implantable Lead Serial Number: 301168
Implantable Pulse Generator Implant Date: 20230615
Lead Channel Impedance Value: 572 Ohm
Lead Channel Pacing Threshold Amplitude: 0.7 V
Lead Channel Pacing Threshold Pulse Width: 0.4 ms
Lead Channel Setting Pacing Amplitude: 2.5 V
Lead Channel Setting Pacing Pulse Width: 0.4 ms
Lead Channel Setting Sensing Sensitivity: 0.5 mV
Pulse Gen Serial Number: 217479
Zone Setting Status: 755011

## 2023-02-21 ENCOUNTER — Encounter (INDEPENDENT_AMBULATORY_CARE_PROVIDER_SITE_OTHER): Payer: Medicare Other | Admitting: Cardiology

## 2023-02-21 DIAGNOSIS — G4734 Idiopathic sleep related nonobstructive alveolar hypoventilation: Secondary | ICD-10-CM

## 2023-02-21 DIAGNOSIS — R0683 Snoring: Secondary | ICD-10-CM | POA: Diagnosis not present

## 2023-02-23 ENCOUNTER — Other Ambulatory Visit (HOSPITAL_COMMUNITY): Payer: Self-pay

## 2023-02-24 ENCOUNTER — Telehealth (HOSPITAL_COMMUNITY): Payer: Self-pay

## 2023-02-24 NOTE — Progress Notes (Unsigned)
Cardiology Office Note:   Date:  02/25/2023  ID:  Shelly BLUMENSCHEIN, DOB Oct 26, 1970, MRN [id number]  EP: Lewayne Bunting, MD  History of Present Illness:   Shelly [name] is a 52 y.o. female seen today for routine electrophysiology followup.   Underwent BAT implantation 01/30/2023  Since last being seen in our clinic the patient reports doing about the same. Fatigue is her main issue. She is very fatigued with really any activity. She has noted that she is now able to get dressed and in and out of the shower without fatigue (new),  Her goals for device titration are caring for her grandson (not born yet), wants to be able to play and take care of him without fatigue.   Device History: Barostim (standard) implanted 01/30/2023 for Chronic systolic CHF [employer] ICD implanted 11/2021 for primary prevention.   Review of systems complete and found to be negative unless listed in HPI.    EP Information / Studies Reviewed:    EKG is not ordered today. EKG from 11/04/2022 reviewed which showed NSR at 97 bpm   Barostim Interrogation- Performed personally and reviewed in detail today,  See scanned report  Physical Exam:   VS:  BP 110/80   Pulse 84   Ht 5\' 8"  (1.727 m)   Wt 199 lb (90.3 kg)   LMP  (LMP Unknown)   SpO2 93%   BMI 30.26 kg/m    Wt Readings from Last 3 Encounters:  02/25/23 199 lb (90.3 kg)  02/05/23 194 lb 0.1 oz (88 kg)  01/30/23 196 lb (88.9 kg)     GEN: Well nourished, well developed in no acute distress NECK: No JVD; No carotid bruits CARDIAC: Regular rate and rhythm, no murmurs, rubs, gallops RESPIRATORY:  Clear to auscultation without rales, wheezing or rhonchi  ABDOMEN: Soft, non-tender, non-distended EXTREMITIES:  No edema; No deformity   ASSESSMENT AND PLAN:    Chronic systolic CHF s/p Environmental manager and Barostim implantation NYHA III-IIIB symptoms.   Device titrated from 1.0 millamp to 5.0 milliamp by increments of 0.4 with good blood  pressure response and no stimulation.  Device impedence stable. Pt goals are to increase her energy level and to be able to play with her soon to be born grandson  Normal device function See scanned report. Will follow up in 4-6 weeks to continue titration with goal of 6-8 milliamps for chronic settings.   CAD Denies s/s ischemia  Disposition:   Follow up with EP APP in 4-6 weeks.   Signed, Graciella Freer, PA-C

## 2023-02-24 NOTE — Telephone Encounter (Signed)
Advanced Heart Failure Patient Advocate Encounter  Returned patient call inquiring about assistance for Eliquis. Discussed 3% criteria for BMS and patient does not think she would meet the requirement at this time. I informed her that she is welcome to check with the pharmacy for a printout and reach out to myself of Marisue Ivan if she would like to apply in the future.  Burnell Blanks, CPhT Rx Patient Advocate Phone: [phone]

## 2023-02-25 ENCOUNTER — Encounter: Payer: Self-pay | Admitting: Student

## 2023-02-25 ENCOUNTER — Encounter: Payer: Self-pay | Admitting: *Deleted

## 2023-02-25 ENCOUNTER — Ambulatory Visit: Payer: Medicare Other | Attending: Student | Admitting: Student

## 2023-02-25 VITALS — BP 110/80 | HR 84 | Ht 68.0 in | Wt 199.0 lb

## 2023-02-25 DIAGNOSIS — I5022 Chronic systolic (congestive) heart failure: Secondary | ICD-10-CM

## 2023-02-25 DIAGNOSIS — Z006 Encounter for examination for normal comparison and control in clinical research program: Secondary | ICD-10-CM

## 2023-02-25 DIAGNOSIS — I2510 Atherosclerotic heart disease of native coronary artery without angina pectoris: Secondary | ICD-10-CM

## 2023-02-25 NOTE — Patient Instructions (Signed)
Medication Instructions:  Your physician recommends that you continue on your current medications as directed. Please refer to the Current Medication list given to you today.  *If you need a refill on your cardiac medications before your next appointment, please call your pharmacy*  Lab Work: None ordered If you have labs (blood work) drawn today and your tests are completely normal, you will receive your results only by: MyChart Message (if you have MyChart) OR A paper copy in the mail If you have any lab test that is abnormal or we need to change your treatment, we will call you to review the results.  Follow-Up: At [facility], you and your health needs are our priority.  As part of our continuing mission to provide you with exceptional heart care, we have created designated Provider Care Teams.  These Care Teams include your primary Cardiologist (physician) and Advanced Practice Providers (APPs -  Physician Assistants and Nurse Practitioners) who all work together to provide you with the care you need, when you need it.  Your next appointment:   04/08/23 at 9:40 AM  Provider:   Casimiro Needle "Otilio Saber, PA-C

## 2023-02-26 NOTE — Progress Notes (Signed)
Remote ICD transmission.   

## 2023-02-28 IMAGING — DX DG THORACIC SPINE 2V
3 series · 3 of 3 positions shown · non-contrast
Comparison: None.

CLINICAL DATA: Status post fall.  back pain.

EXAM:
THORACIC SPINE 2 VIEWS

[t-spine ap]
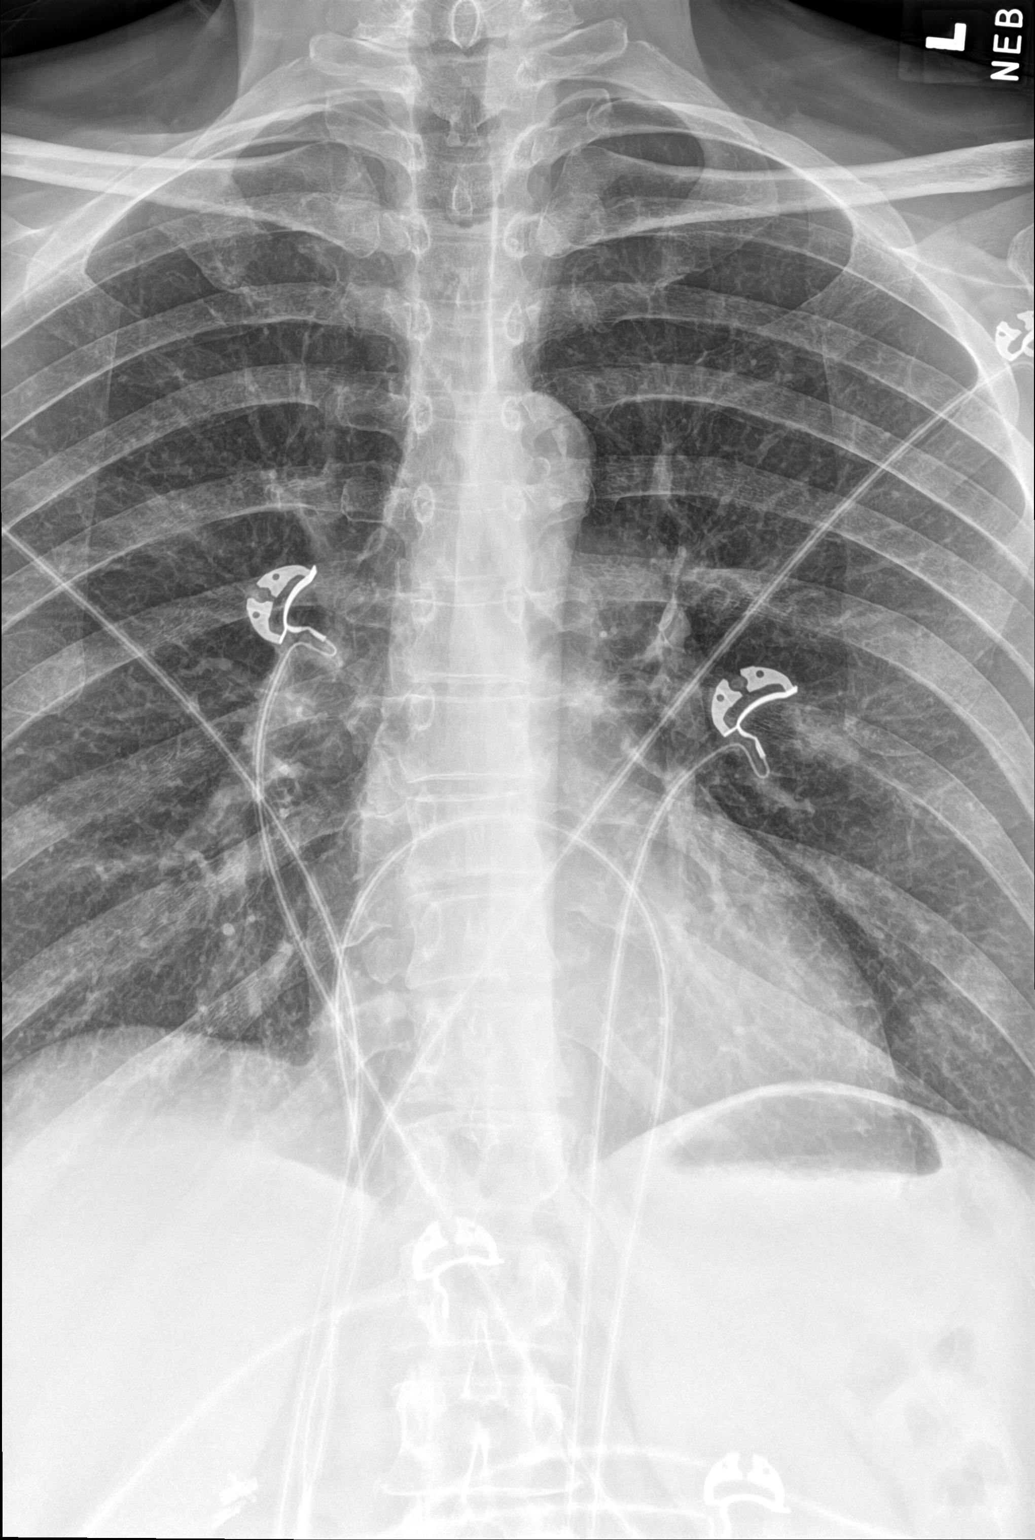

[t-spine lat]
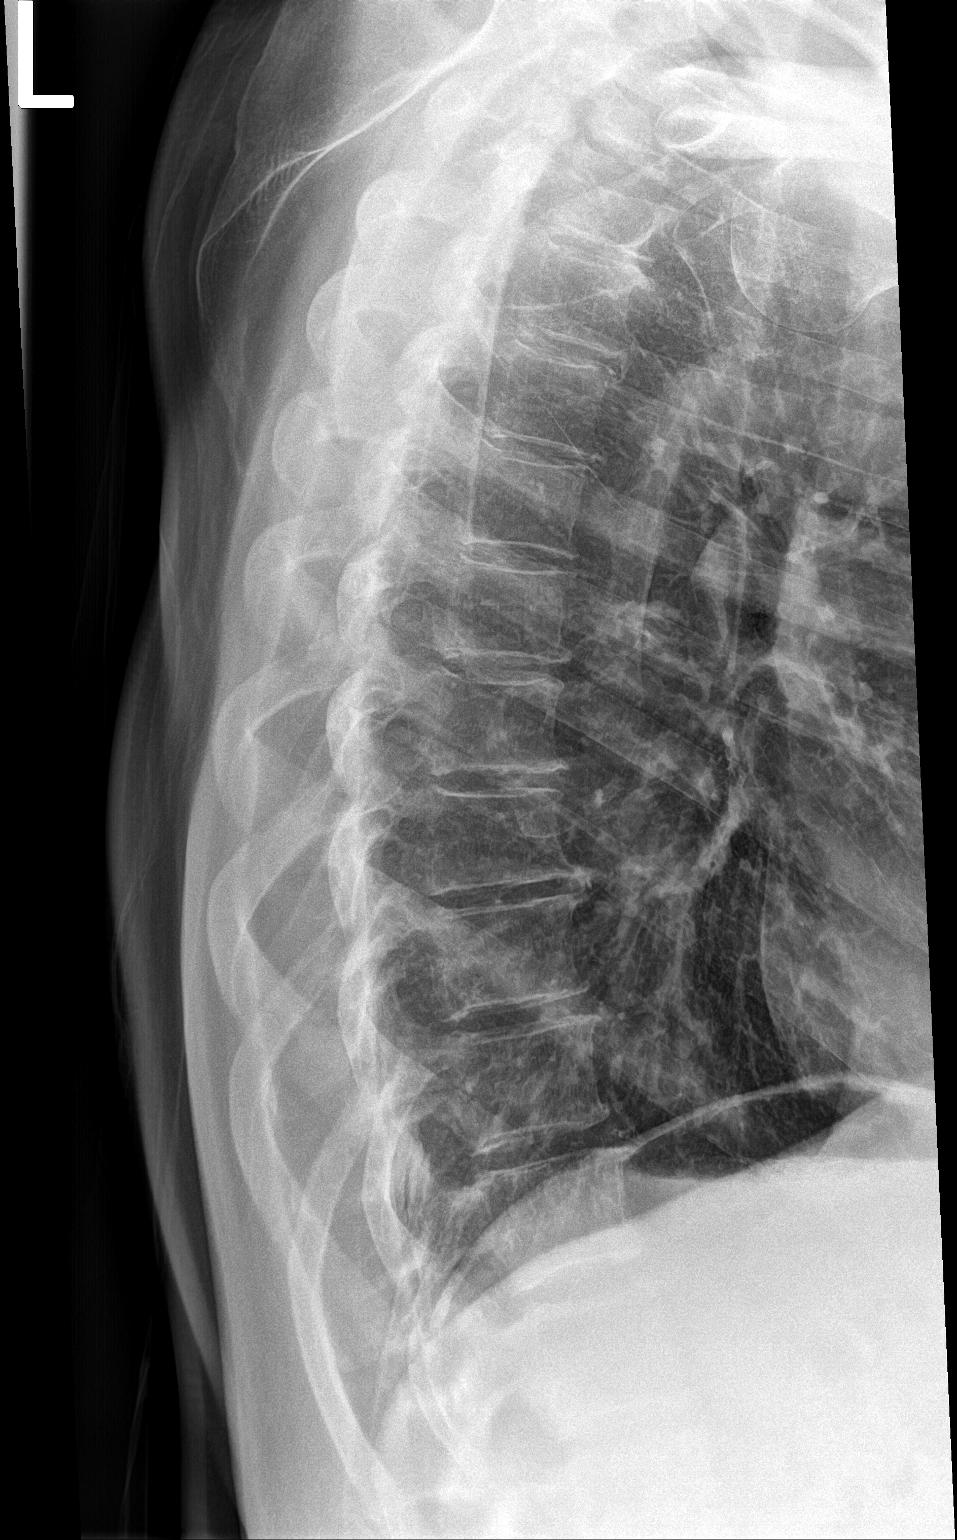

[ct-spine swimmers]
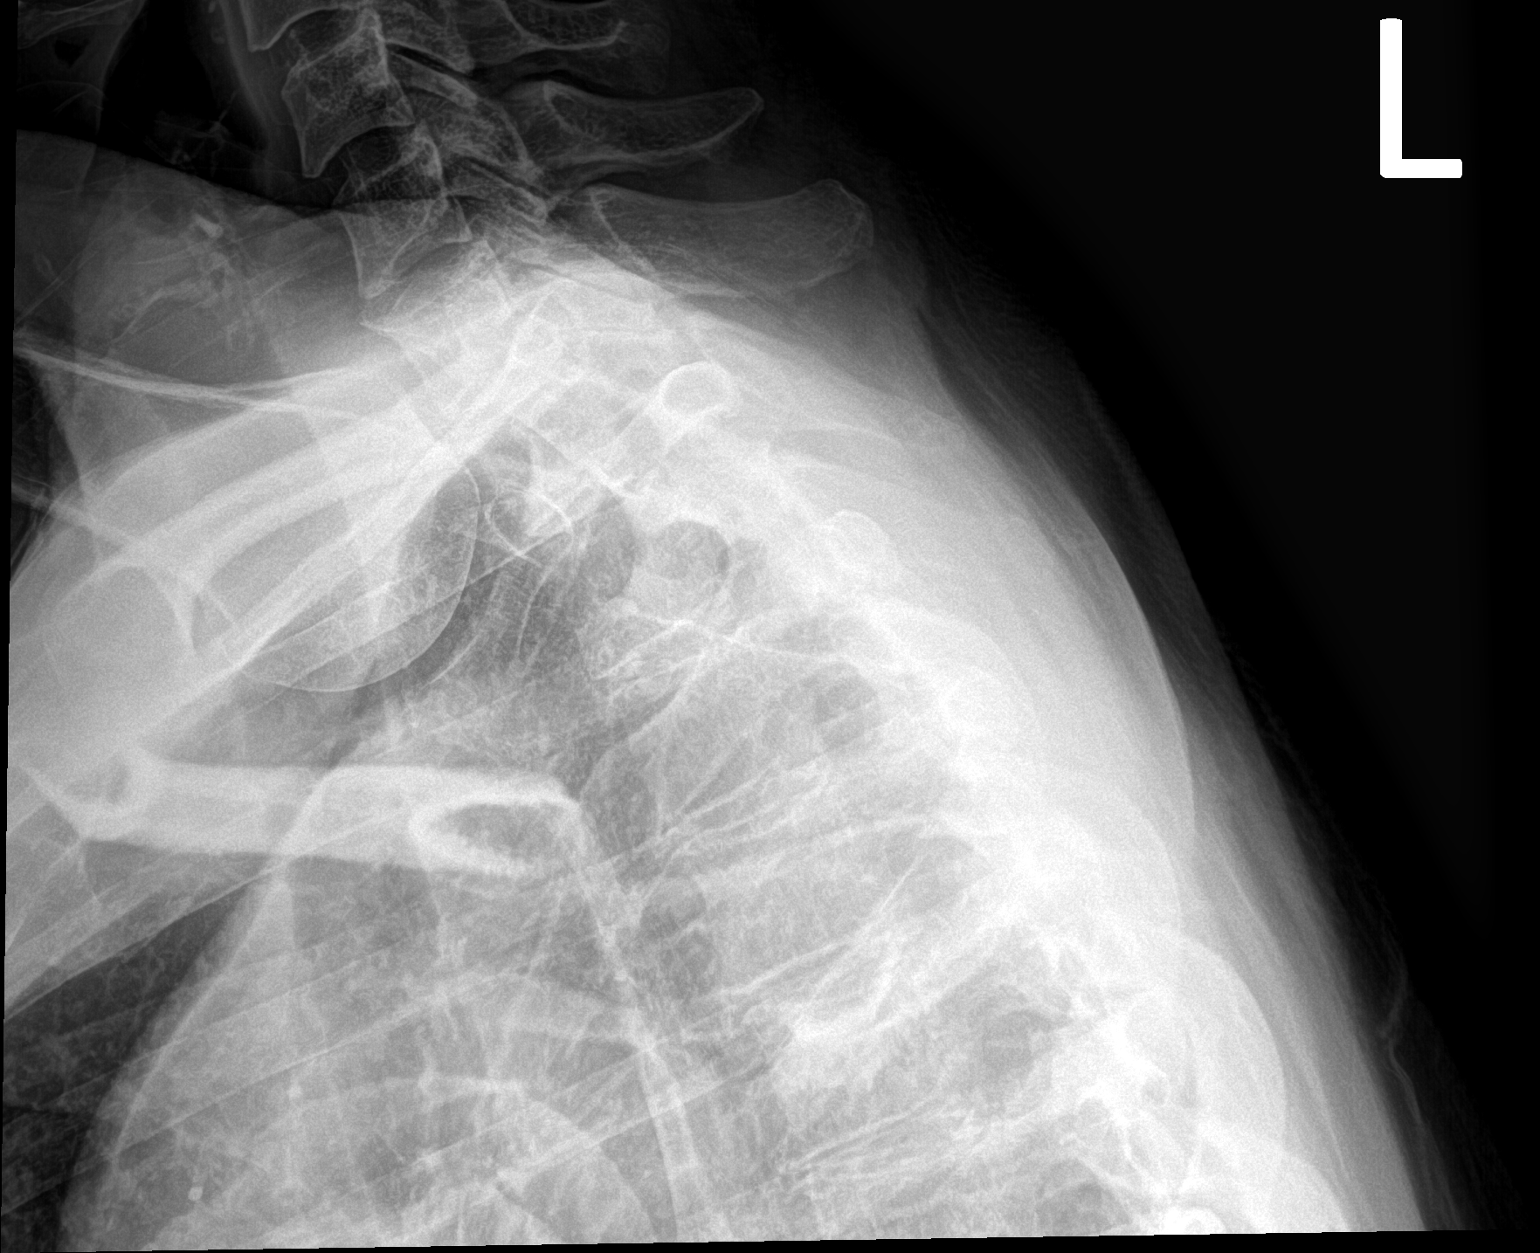

[3 of 3 positions shown; findings below may reference images not displayed]

FINDINGS: Markedly limited evaluation due to overlapping osseous structures
and overlying soft tissues. there is no evidence of thoracic spine
fracture. Alignment is normal. No other significant bone
abnormalities are identified. Mild multilevel degenerative changes
of the spine.
IMPRESSION: No acute displaced fracture or traumatic listhesis of the thoracic
spine. Markedly limited evaluation due to overlapping osseous
structures and overlying soft tissues.

## 2023-02-28 IMAGING — CT CT HEAD W/O CM
4 series · 16 of 47 positions shown, 18 images · non-contrast
Comparison: CT head 01/07/2021

CLINICAL DATA: Status post fall

EXAM:
CT HEAD WITHOUT CONTRAST
CT CERVICAL SPINE WITHOUT CONTRAST
TECHNIQUE: Multidetector CT imaging of the head and cervical spine was
performed following the standard protocol without intravenous
contrast. Multiplanar CT image reconstructions of the cervical spine
were also generated.

[Series 2: head wo · axial · 0.44mm/px · z∈[-203,-83]mm · 7 of 33 slices shown, 9 images]
[im 5/33  brain]
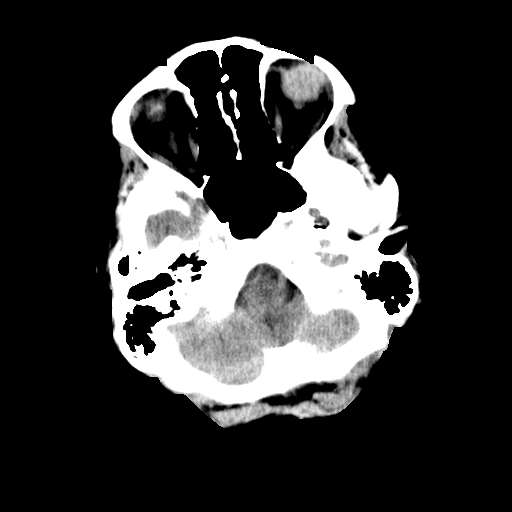
[im 5/33  bone]
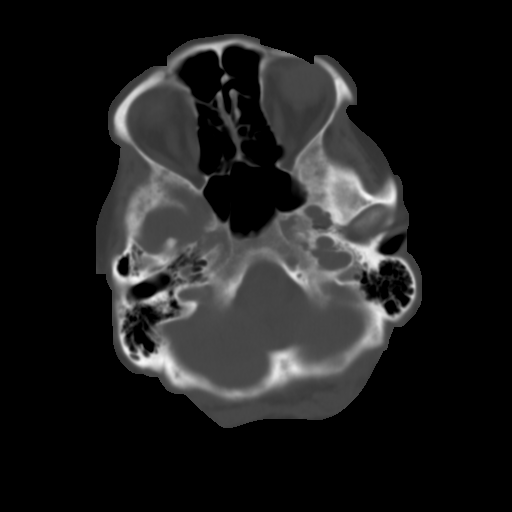
[im 9/33  brain]
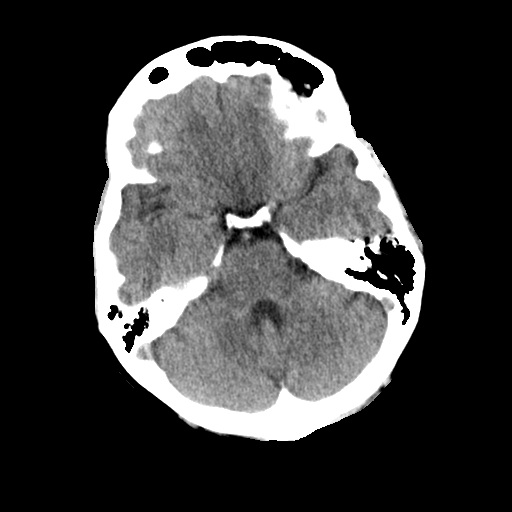
[im 13/33  brain]
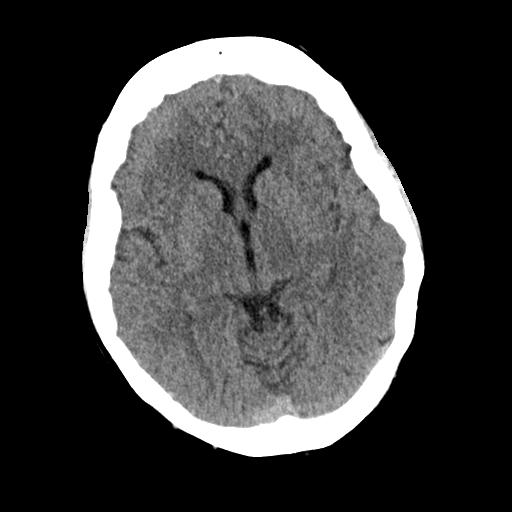
[im 17/33  brain]
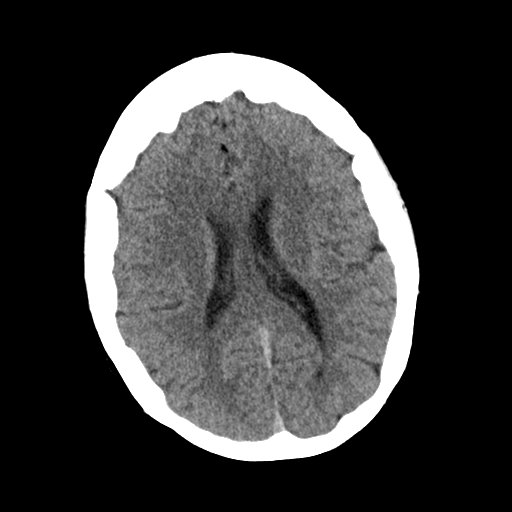
[im 21/33  brain]
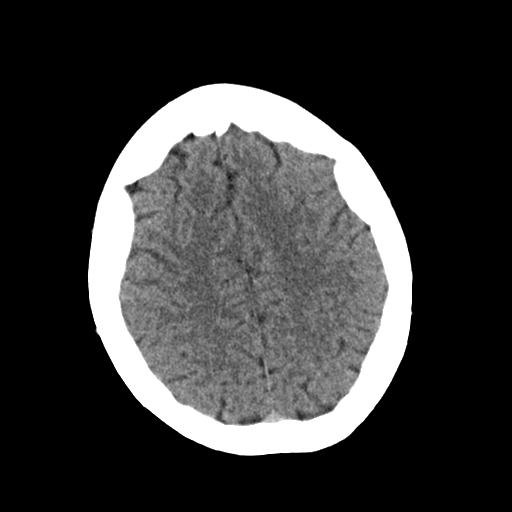
[im 21/33  bone]
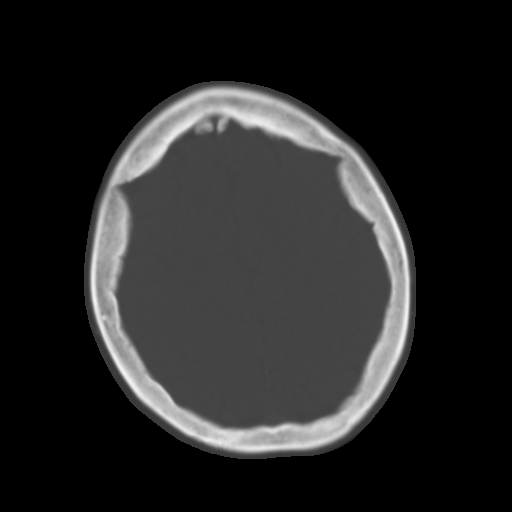
[im 25/33  brain]
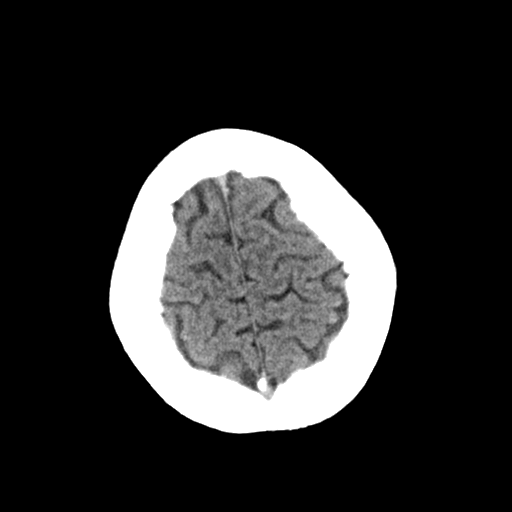
[im 29/33  brain]
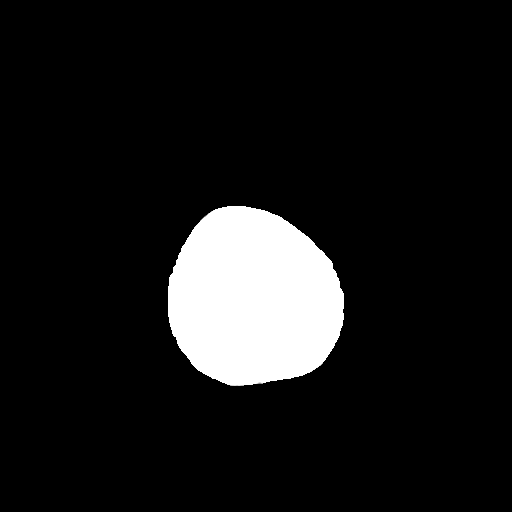

[Series 3: head bone · axial · 0.44mm/px · z∈[-207,-175]mm · 3 of 83 slices shown]
[im 9/83  bone]
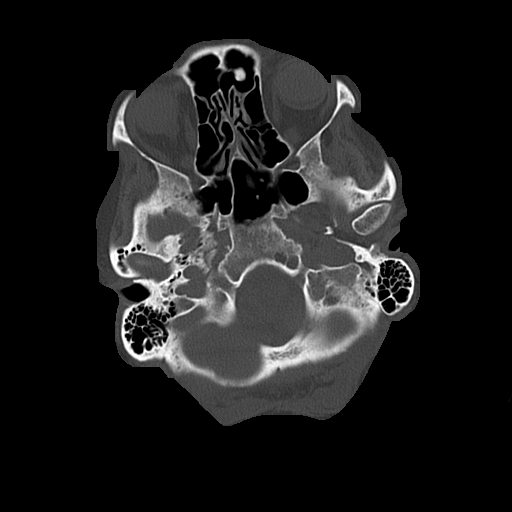
[im 17/83  bone]
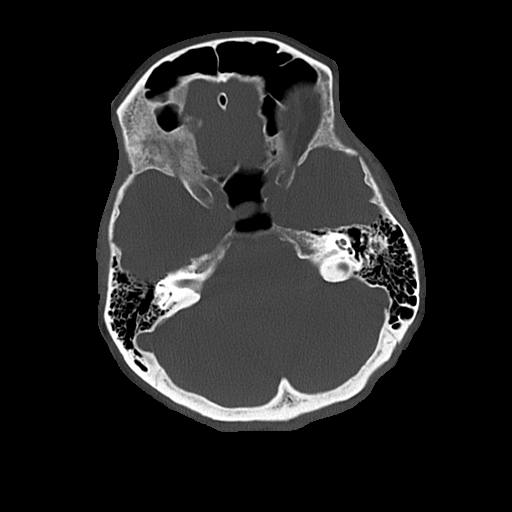
[im 25/83  bone]
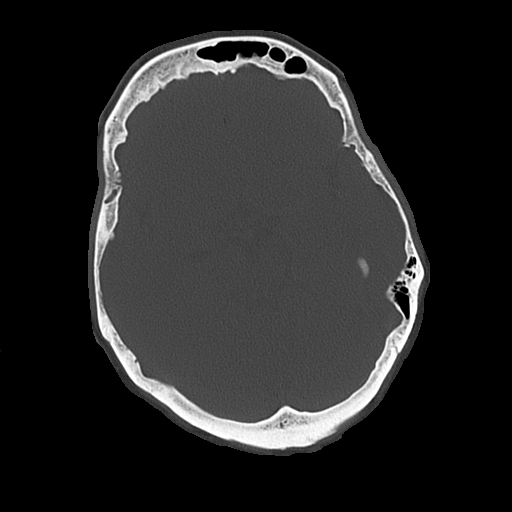

[Series 4: coronal soft · coronal · 0.33mm/px · 3 of 68 slices shown]
[im 23/68  brain]
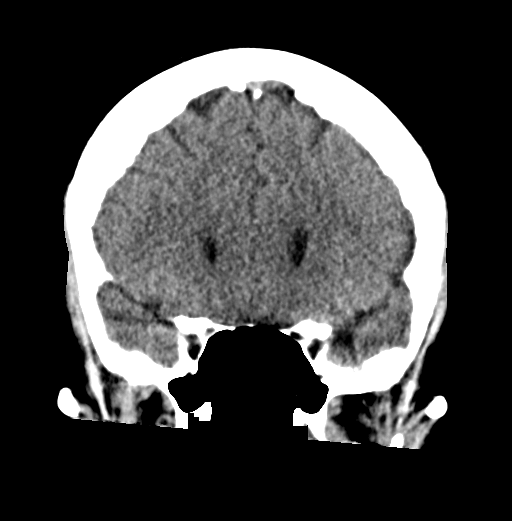
[im 30/68  brain]
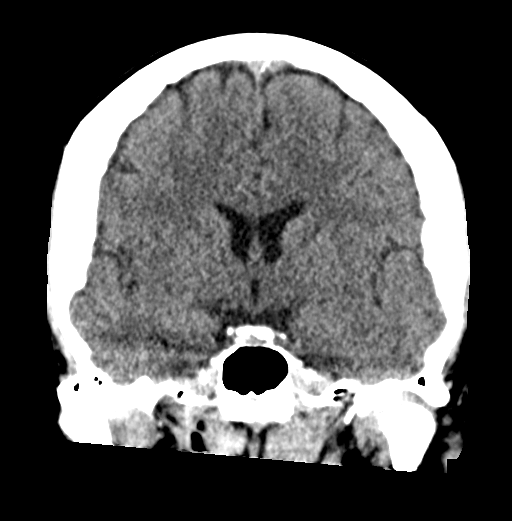
[im 38/68  brain]
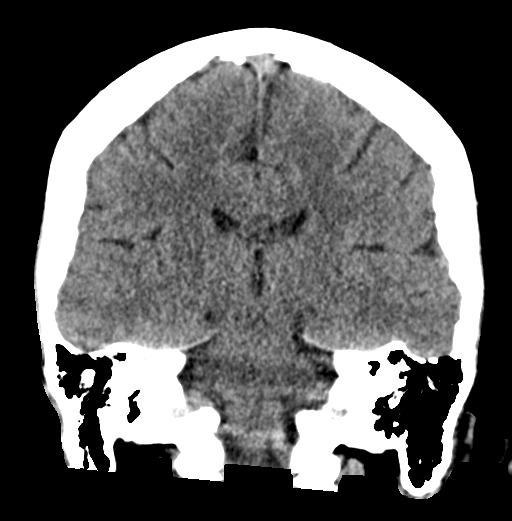

[Series 5: sagittal soft · sagittal · 0.34mm/px · 3 of 57 slices shown]
[im 19/57  brain]
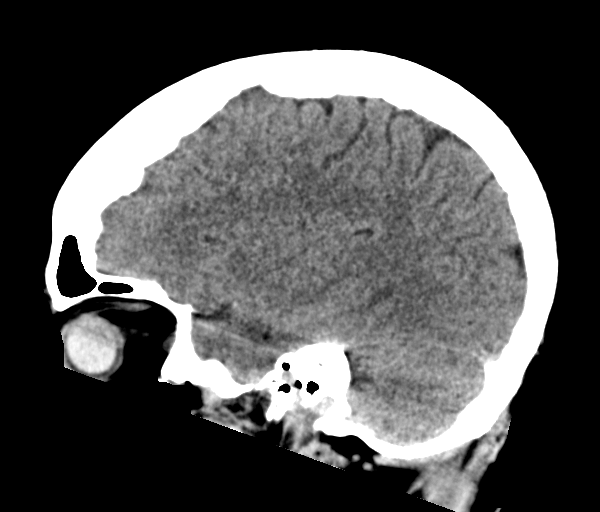
[im 29/57  brain]
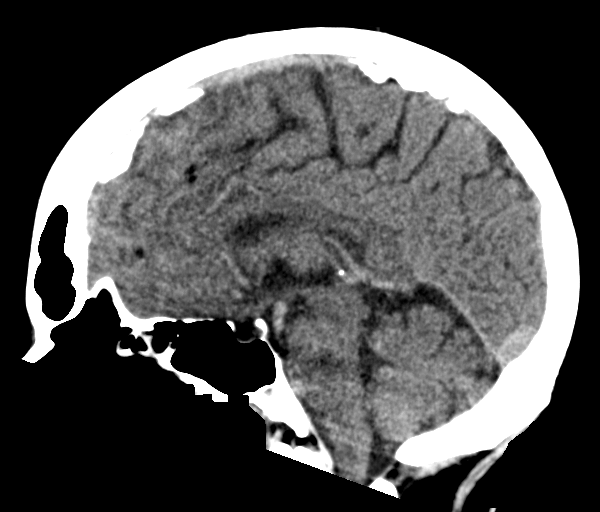
[im 38/57  brain]
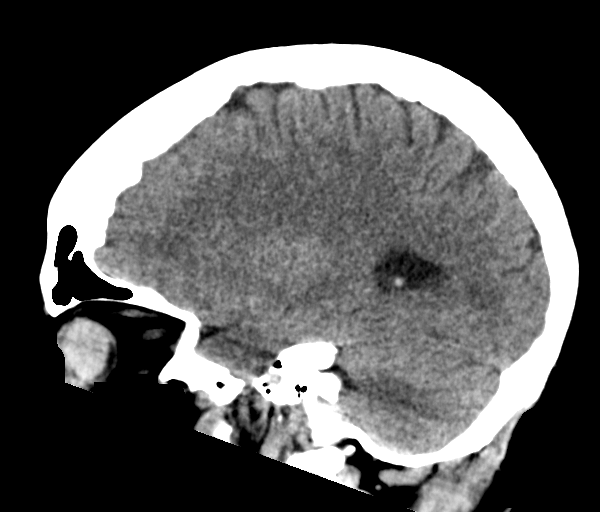

[16 of 47 positions shown; findings below may reference images not displayed]

FINDINGS: CT HEAD FINDINGS

Brain:

No evidence of large-territorial acute infarction. No parenchymal
hemorrhage. No mass lesion. No extra-axial collection.

No mass effect or midline shift. No hydrocephalus. Basilar cisterns
are patent.

Vascular: No hyperdense vessel.

Skull: No acute fracture or focal lesion.

Sinuses/Orbits: Paranasal sinuses and mastoid air cells are clear.
High density material in the bilateral globes, likely representing
intra-ocular silicone oil related to prior retinal detachment
surgical repair. Otherwise the orbits are unremarkable.

Other: None.

CT CERVICAL SPINE FINDINGS

Alignment: Normal.

Skull base and vertebrae: No acute fracture. No aggressive appearing
focal osseous lesion or focal pathologic process.

Soft tissues and spinal canal: No prevertebral fluid or swelling. No
visible canal hematoma.

Upper chest: Unremarkable.

Other: None.
IMPRESSION: 1. No acute intracranial abnormality.
2. No acute displaced fracture or traumatic listhesis of the
cervical spine.

## 2023-02-28 IMAGING — DX DG CHEST 2V
2 series · 2 of 2 positions shown · non-contrast
Comparison: Chest x-ray 10/13/2019, CT chest 10/13/2019

CLINICAL DATA: Fall.  Back pain and headache.

EXAM:
CHEST - 2 VIEW

[chest pa]
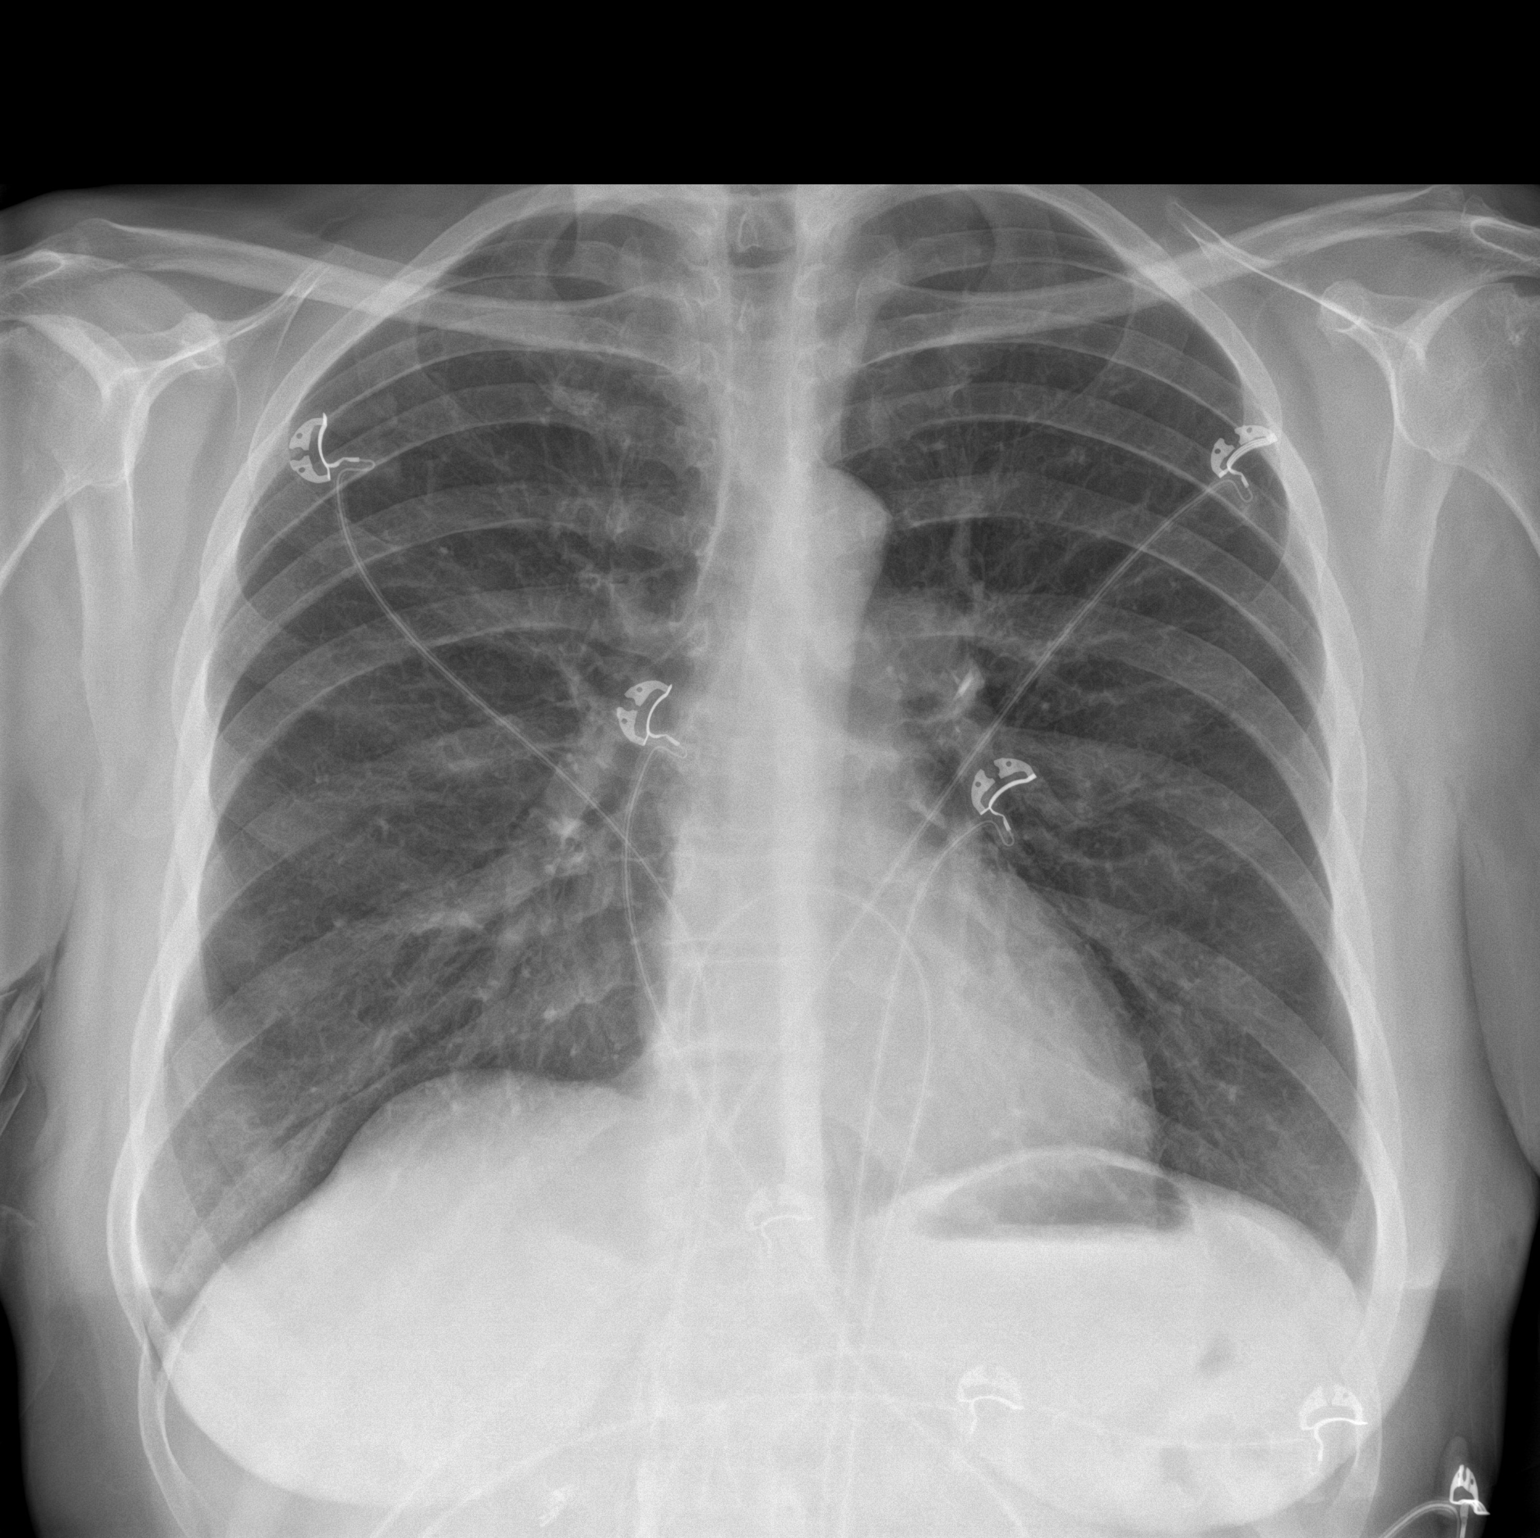

[chest lat]
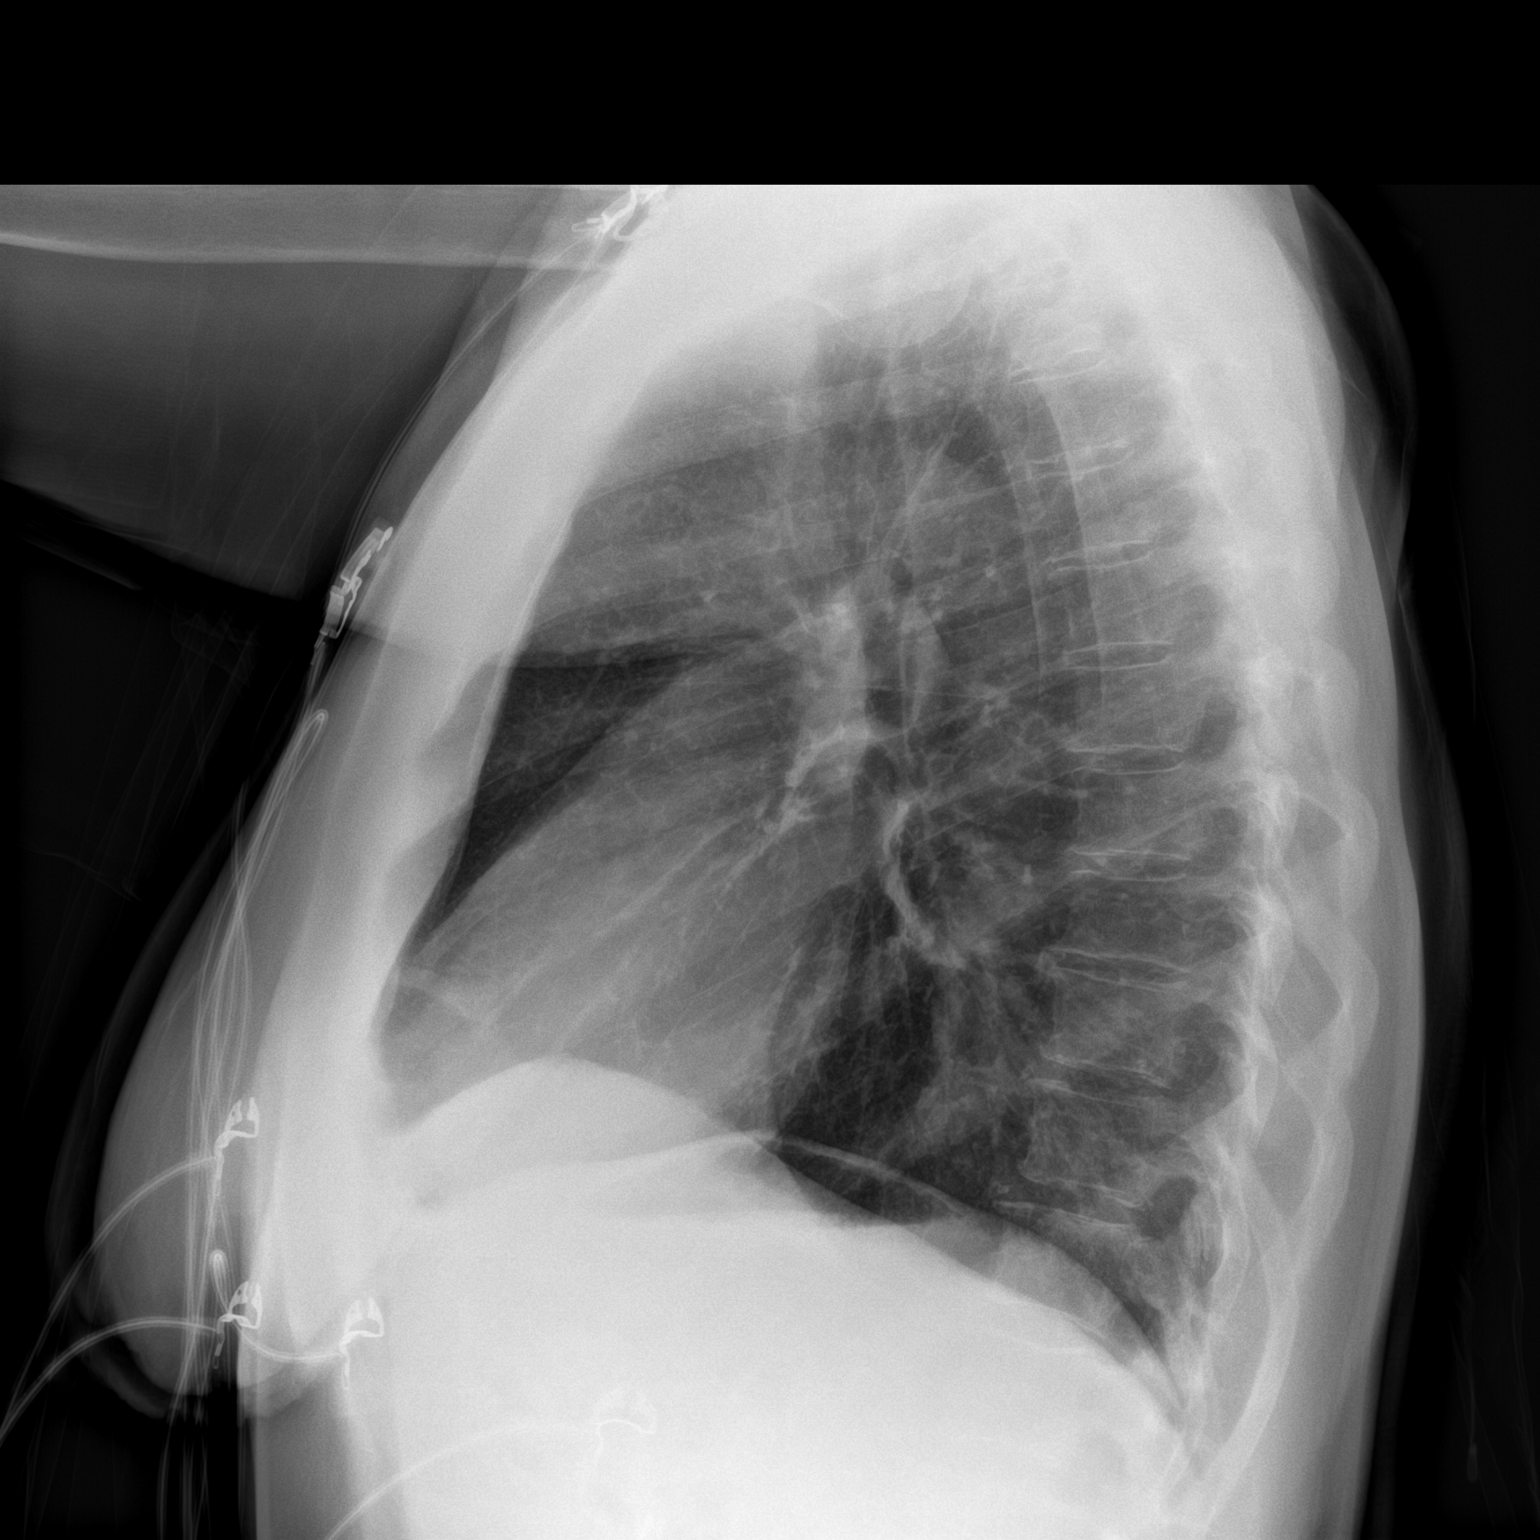

[2 of 2 positions shown; findings below may reference images not displayed]

FINDINGS: The heart and mediastinal contours are within normal limits.

No focal consolidation. No pulmonary edema. No pleural effusion. No
pneumothorax.

No acute osseous abnormality.
IMPRESSION: No active cardiopulmonary disease.

## 2023-02-28 IMAGING — CT CT CERVICAL SPINE W/O CM
3 of 4 series · 12 of 33 positions shown, 14 images · non-contrast
Comparison: CT head 01/07/2021

CLINICAL DATA: Status post fall

EXAM:
CT HEAD WITHOUT CONTRAST
CT CERVICAL SPINE WITHOUT CONTRAST
TECHNIQUE: Multidetector CT imaging of the head and cervical spine was
performed following the standard protocol without intravenous
contrast. Multiplanar CT image reconstructions of the cervical spine
were also generated.

[Series 5: cor bone · coronal · 0.29mm/px · 3 of 62 slices shown]
[im 16/62  bone]
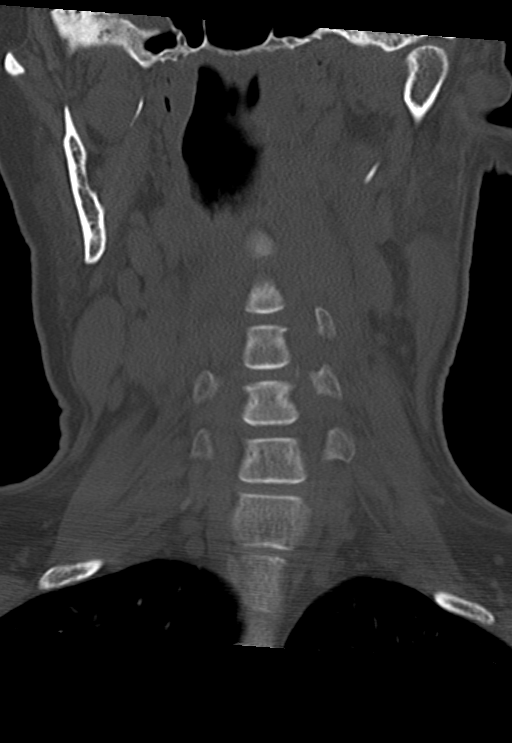
[im 26/62  bone]
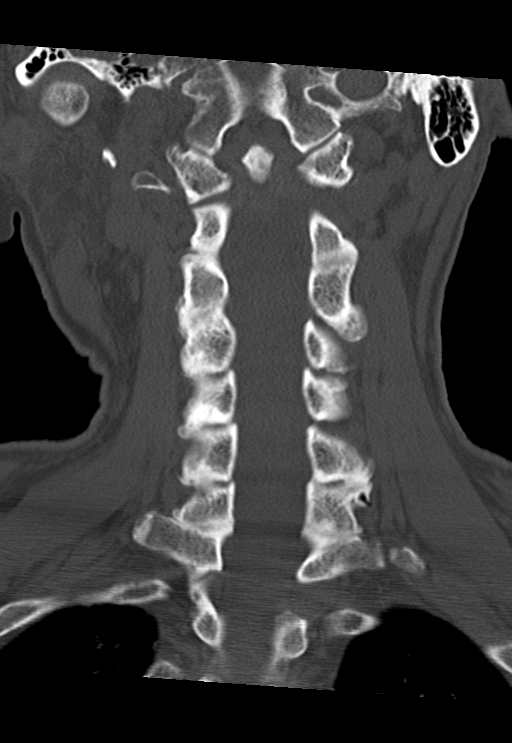
[im 36/62  bone]
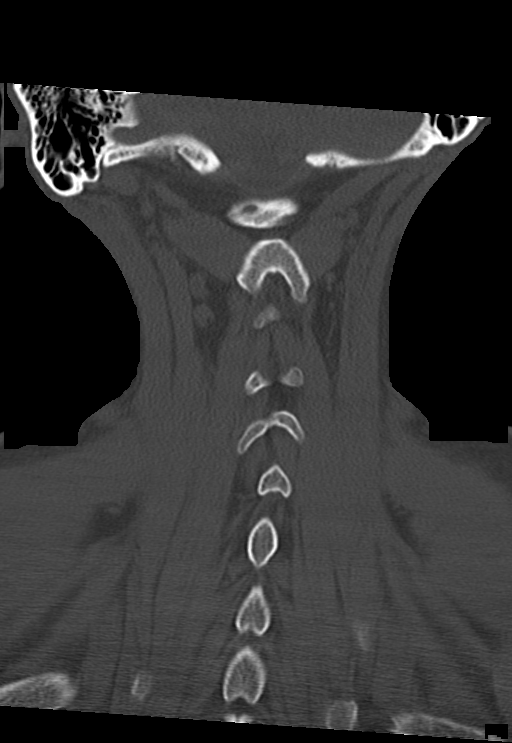

[Series 6: sag bone · sagittal · 0.24mm/px · 5 of 63 slices shown, 6 images]
[im 21/63  bone]
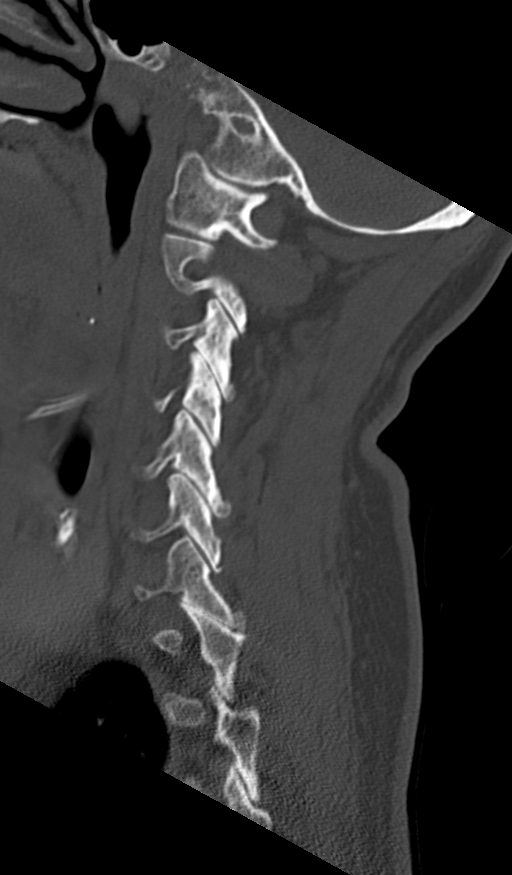
[im 26/63  bone]
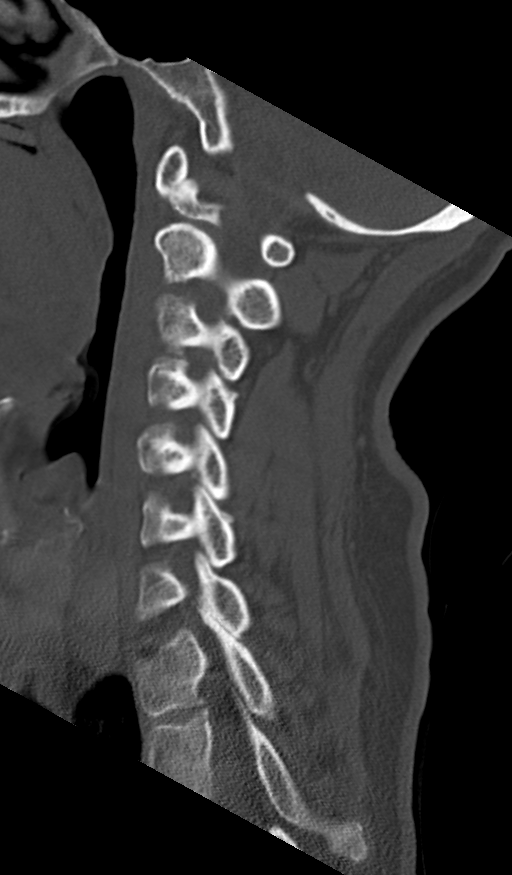
[im 32/63  soft-tissue]
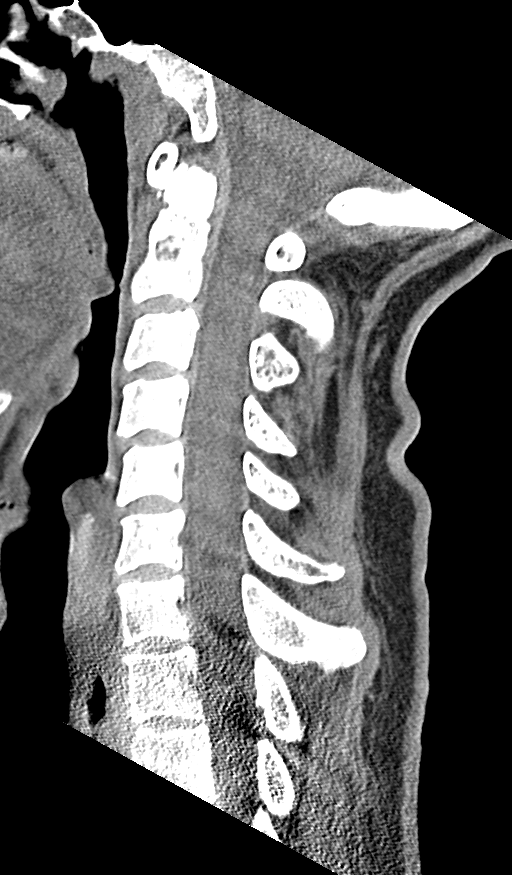
[im 32/63  bone]
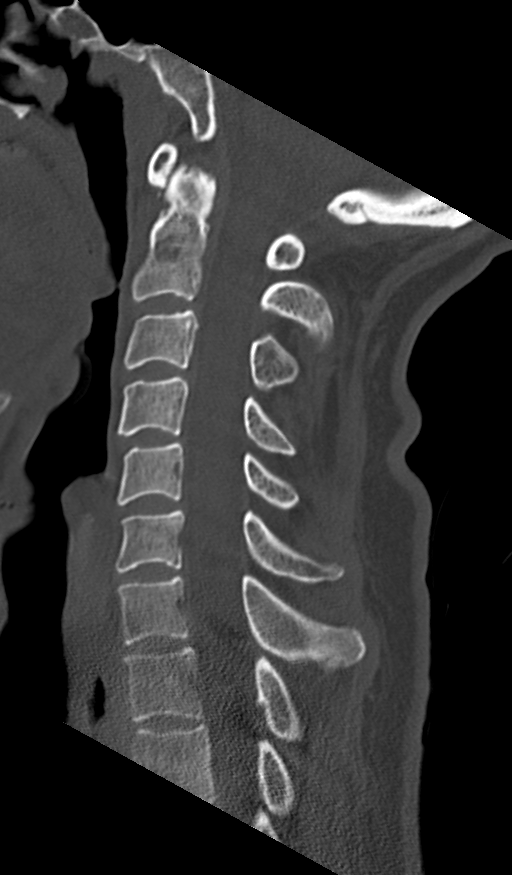
[im 37/63  bone]
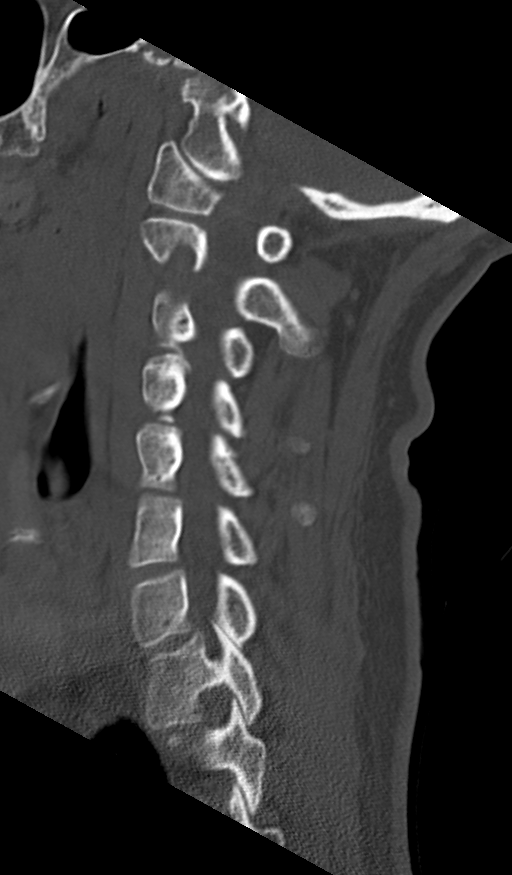
[im 42/63  bone]
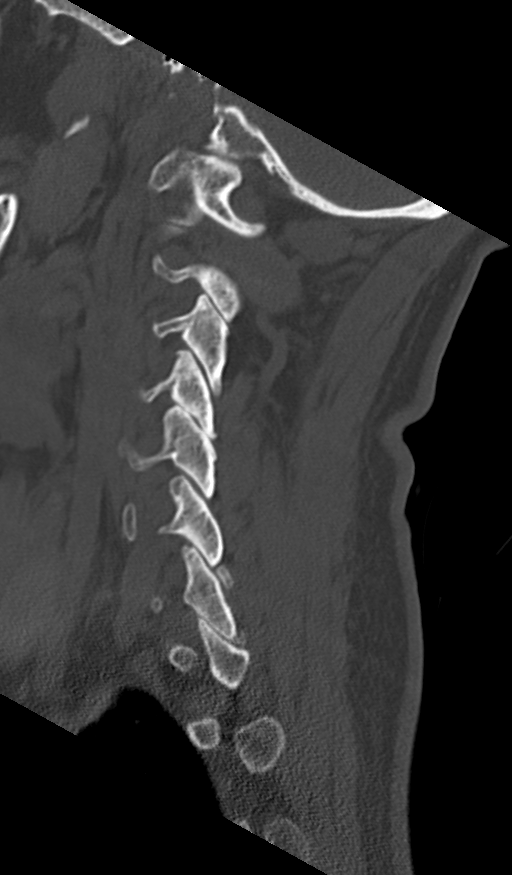

[Series 7: orthogonal axials · axial · 0.21mm/px · z∈[-372,-266]mm · 4 of 88 slices shown, 5 images]
[im 13/88  soft-tissue]
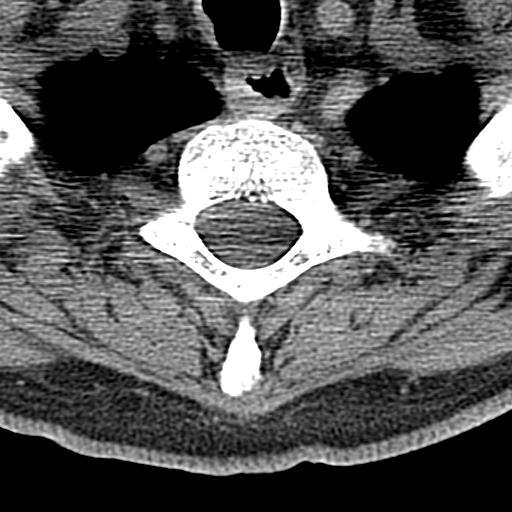
[im 13/88  bone]
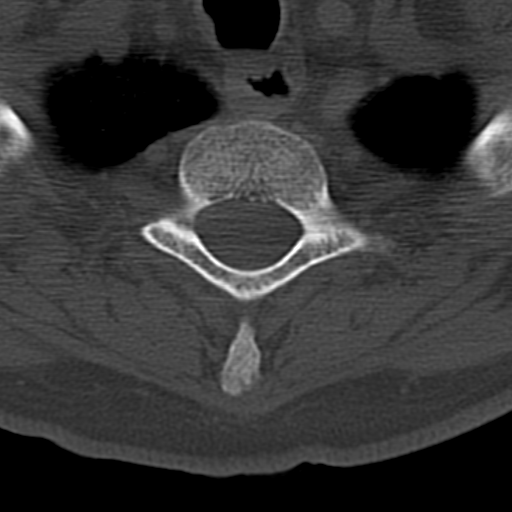
[im 38/88  bone]
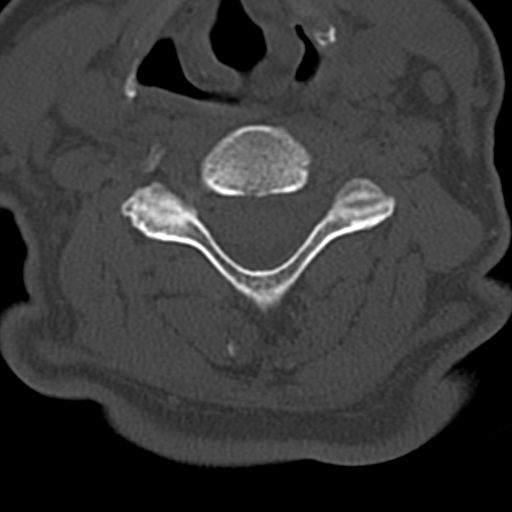
[im 50/88  bone]
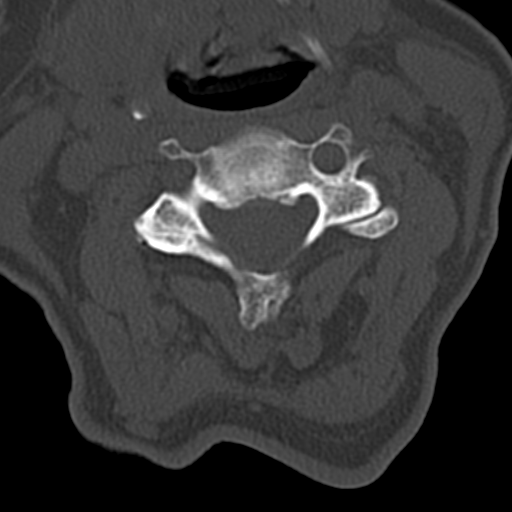
[im 75/88  bone]
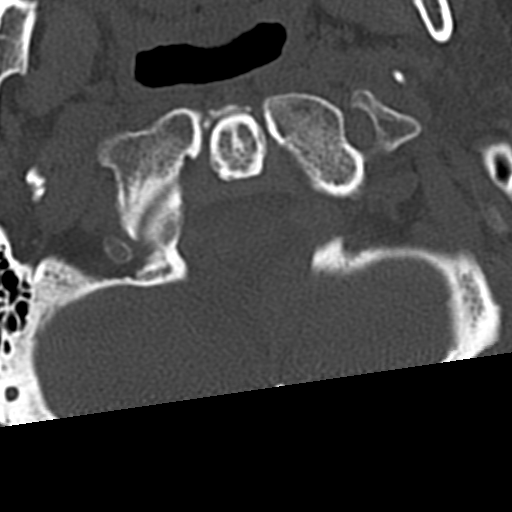

[12 of 33 positions shown; findings below may reference images not displayed]

FINDINGS: CT HEAD FINDINGS

Brain:

No evidence of large-territorial acute infarction. No parenchymal
hemorrhage. No mass lesion. No extra-axial collection.

No mass effect or midline shift. No hydrocephalus. Basilar cisterns
are patent.

Vascular: No hyperdense vessel.

Skull: No acute fracture or focal lesion.

Sinuses/Orbits: Paranasal sinuses and mastoid air cells are clear.
High density material in the bilateral globes, likely representing
intra-ocular silicone oil related to prior retinal detachment
surgical repair. Otherwise the orbits are unremarkable.

Other: None.

CT CERVICAL SPINE FINDINGS

Alignment: Normal.

Skull base and vertebrae: No acute fracture. No aggressive appearing
focal osseous lesion or focal pathologic process.

Soft tissues and spinal canal: No prevertebral fluid or swelling. No
visible canal hematoma.

Upper chest: Unremarkable.

Other: None.
IMPRESSION: 1. No acute intracranial abnormality.
2. No acute displaced fracture or traumatic listhesis of the
cervical spine.

## 2023-03-02 ENCOUNTER — Ambulatory Visit: Payer: Medicare Other | Attending: Internal Medicine

## 2023-03-02 DIAGNOSIS — I5020 Unspecified systolic (congestive) heart failure: Secondary | ICD-10-CM

## 2023-03-02 NOTE — Procedures (Signed)
SLEEP STUDY REPORT Patient Information Study Date: 02/21/2023 Patient Name: Shelly [name] Patient ID: [id number] Birth Date: 12-19-1970 Age: 52 Gender: Female BMI: 29.7 (W=196 lb, H=5' 8'') Stopbang: 4 Referring Physician: Nicholes Mango, MD  TEST DESCRIPTION: Home sleep apnea testing was completed using the WatchPat, a Type 1 device, utilizing  peripheral arterial tonometry (PAT), chest movement, actigraphy, pulse oximetry, pulse rate, body position and snore.  AHI was calculated with apnea and hypopnea using valid sleep time as the denominator. RDI includes apneas,  hypopneas, and RERAs. The data acquired and the scoring of sleep and all associated events were performed in  accordance with the recommended standards and specifications as outlined in the AASM Manual for the Scoring of  Sleep and Associated Events 2.2.0 (2015).   FINDINGS: 1. No evidence of Obstructive Sleep Apnea with AHI 1.2/hr.  2. No Central Sleep Apnea. 3. Oxygen desaturations as low as 80%. 4. Mild snoring was present. O2 sats were < 88% for 27.2minutes. 5. Total sleep time was 8 hrs and 8 min. 6. 16.7% of total sleep time was spent in REM sleep.  7. sleep onset latency at 22 min.  8. REM sleep onset latency at 30 min.  9. Total awakenings were 14.   DIAGNOSIS:  Normal study with no significant sleep disordered breathing. Nocturnal Hypoxemia  RECOMMENDATIONS: 1. Normal study with no significant sleep disordered breathing.  2. Healthy sleep recommendations include: adequate nightly sleep (normal 7-9 hrs/night), avoidance of caffeine after  noon and alcohol near bedtime, and maintaining a sleep environment that is cool, dark and quiet.  3. Weight loss for overweight patients is recommended.   4. Snoring recommendations include: weight loss where appropriate, side sleeping, and avoidance of alcohol before  bed.  5. Operation of motor vehicle or dangerous equipment must be avoided when feeling  drowsy, excessively sleepy, or  mentally fatigued.   6. An ENT consultation which may be useful for specific causes of and possible treatment of bothersome snoring .   7. Weight loss may be of benefit in reducing the severity of snoring.   Signature: Armanda Magic, MD; [facility]; Diplomat, American Board of Sleep  Medicine Electronically Signed: 03/02/2023 12:49:06 PM

## 2023-03-03 ENCOUNTER — Other Ambulatory Visit (HOSPITAL_COMMUNITY): Payer: Self-pay

## 2023-03-03 MED ORDER — OSELTAMIVIR PHOSPHATE 75 MG PO CAPS
75.0000 mg | ORAL_CAPSULE | Freq: Two times a day (BID) | ORAL | 0 refills | Status: DC
  Filled 2023-03-03: qty 10, 5d supply, fill #0

## 2023-03-03 MED ORDER — ONDANSETRON 4 MG PO TBDP
4.0000 mg | ORAL_TABLET | ORAL | 0 refills | Status: DC | PRN
  Filled 2023-03-03: qty 30, 5d supply, fill #0

## 2023-03-03 MED ORDER — BENZONATATE 200 MG PO CAPS
200.0000 mg | ORAL_CAPSULE | Freq: Three times a day (TID) | ORAL | 0 refills | Status: DC
  Filled 2023-03-03: qty 30, 10d supply, fill #0

## 2023-03-03 NOTE — Research (Signed)
REBALANCE  Informed Consent   Subject Name: FLORIAN LITE  Subject met inclusion and exclusion criteria.  The informed consent form, study requirements and expectations were reviewed with the subject and questions and concerns were addressed prior to the signing of the consent form.  The subject verbalized understanding of the trial requirements.  The subject agreed to participate in the REBALANCE  trial and signed the informed consent  on 09--18-2024.  The informed consent was obtained prior to performance of any protocol-specific procedures for the subject.  A copy of the signed informed consent was given to the subject and a copy was placed in the subject's medical record.  This patient was consented by Amy Terrell  [place] Floyd Lusignan, Research Coordinator

## 2023-03-09 ENCOUNTER — Other Ambulatory Visit (HOSPITAL_COMMUNITY): Payer: Self-pay

## 2023-03-09 ENCOUNTER — Telehealth: Payer: Self-pay | Admitting: *Deleted

## 2023-03-09 ENCOUNTER — Other Ambulatory Visit (HOSPITAL_COMMUNITY): Payer: Self-pay | Admitting: Internal Medicine

## 2023-03-09 DIAGNOSIS — R0683 Snoring: Secondary | ICD-10-CM

## 2023-03-09 DIAGNOSIS — R0902 Hypoxemia: Secondary | ICD-10-CM

## 2023-03-09 NOTE — Addendum Note (Signed)
Addended by: Reesa Chew on: 03/09/2023 05:39 PM   Modules accepted: Orders

## 2023-03-09 NOTE — Telephone Encounter (Signed)
The patient has been notified of the result and verbalized understanding.  All questions (if any) were answered. Latrelle Dodrill, CMA 03/09/2023 5:30 PM    Patient wants to discuss her sleep study with her cardiologist before doing the NPSG stud and call us back with her decision.

## 2023-03-09 NOTE — Telephone Encounter (Signed)
-----   Message from Armanda Magic sent at 03/02/2023 12:50 PM EDT ----- Please let patient know that sleep study showed no significant sleep apnea but does show significant drops in O2 sats.  Please set up for in lab PSG

## 2023-03-10 ENCOUNTER — Other Ambulatory Visit (HOSPITAL_COMMUNITY): Payer: Self-pay

## 2023-03-10 MED ORDER — CARVEDILOL 3.125 MG PO TABS
3.1250 mg | ORAL_TABLET | Freq: Two times a day (BID) | ORAL | 2 refills | Status: DC
  Filled 2023-03-10: qty 180, 90d supply, fill #0
  Filled 2023-06-23: qty 180, 90d supply, fill #1
  Filled 2023-09-22: qty 180, 90d supply, fill #2

## 2023-03-20 ENCOUNTER — Other Ambulatory Visit (HOSPITAL_COMMUNITY): Payer: Self-pay

## 2023-03-20 ENCOUNTER — Telehealth: Payer: Self-pay

## 2023-03-20 NOTE — Telephone Encounter (Signed)
**Note De-Identified Ansel Ferrall Obfuscation** Per the [facility] providers portal: Procedure code [phone] Description Polysomnography; age 53 years or older, sleep staging with 4 or more additional parameters of sleep, attended by a technologist Inquiry summary Notification/Prior Authorization not required for this service.

## 2023-03-22 MED ORDER — DULAGLUTIDE 3 MG/0.5ML ~~LOC~~ SOAJ
3.0000 mg | SUBCUTANEOUS | 0 refills | Status: DC
  Filled 2023-03-22 – 2023-03-24 (×2): qty 2, 28d supply, fill #0

## 2023-03-23 ENCOUNTER — Other Ambulatory Visit (HOSPITAL_COMMUNITY): Payer: Self-pay

## 2023-03-24 ENCOUNTER — Other Ambulatory Visit (HOSPITAL_COMMUNITY): Payer: Self-pay

## 2023-03-25 ENCOUNTER — Other Ambulatory Visit (HOSPITAL_COMMUNITY): Payer: Self-pay

## 2023-03-25 ENCOUNTER — Telehealth: Payer: Self-pay | Admitting: Pharmacy Technician

## 2023-03-25 NOTE — Telephone Encounter (Signed)
Pharmacy Patient Advocate Encounter   Received notification from CoverMyMeds that prior authorization for repatha is required/requested.   Insurance verification completed.   The patient is insured through [facility] .   Per test claim: PA required; PA submitted to [facility] via CoverMyMeds Key/confirmation #/[id number] Status is pending

## 2023-03-25 NOTE — Telephone Encounter (Signed)
Pharmacy Patient Advocate Encounter  Received notification from [facility] that Prior Authorization for repatha has been APPROVED from 03/25/23 to 09/23/23   PA #/Case ID/Reference #: [id number]

## 2023-03-31 ENCOUNTER — Other Ambulatory Visit (HOSPITAL_COMMUNITY): Payer: Self-pay

## 2023-04-01 ENCOUNTER — Other Ambulatory Visit (HOSPITAL_COMMUNITY): Payer: Self-pay

## 2023-04-07 NOTE — Progress Notes (Unsigned)
  Cardiology Office Note:   Date:  04/08/2023  ID:  Shelly [name], DOB 01-28-1971, MRN [id number]  EP: Lewayne Bunting, MD  History of Present Illness:   Shelly [name] is a 52 y.o. female seen today for routine electrophysiology followup.   Underwent BAT implantation 01/30/2023  At last visit, Device titrated from 1.0 millamp to 5.0 milliamp by increments of 0.4   Since last being seen in our clinic doing well. Less fatigue showering and dressing. Still SOB walking into the office. Has gotten down into the floor with her new grandson a couple of times and gotten up easier than she feels like she would've been able to in the past.    Her goals for device titration are caring for her grandson, wants to be able to play and take care of him without fatigue.   Device History: Barostim (standard) implanted 01/30/2023 for Chronic systolic CHF [employer] ICD implanted 11/2021 for primary prevention.   Review of systems complete and found to be negative unless listed in HPI.    EP Information / Studies Reviewed:    EKG is not ordered today. EKG from 11/04/2022 reviewed which showed NSR at 97 bpm   Barostim Interrogation- Performed personally and reviewed in detail today,  See scanned report  Physical Exam:   VS:  BP 108/66   Pulse 79   Ht 5\' 8"  (1.727 m)   Wt 198 lb 9.6 oz (90.1 kg)   LMP  (LMP Unknown)   SpO2 97%   BMI 30.20 kg/m    Wt Readings from Last 3 Encounters:  04/08/23 198 lb 9.6 oz (90.1 kg)  02/25/23 199 lb (90.3 kg)  02/05/23 194 lb 0.1 oz (88 kg)     GEN: Well nourished, well developed in no acute distress NECK: No JVD; No carotid bruits CARDIAC: Regular rate and rhythm, no murmurs, rubs, gallops RESPIRATORY:  Clear to auscultation without rales, wheezing or rhonchi  ABDOMEN: Soft, non-tender, non-distended EXTREMITIES:  No edema; No deformity   ASSESSMENT AND PLAN:    Chronic systolic CHF s/p Environmental manager and Barostim implantation NYHA  II-III symptoms.   Device titrated from 5.0 millamp to 6.0 milliamp by increments of 0.4 with good blood pressure response. Pt had stim at 7.0 ma described as tingling and throbbing sensation in her neck.   Device impedence stable. Pt goals are to increase her energy level and to be able to play with her soon to be born grandson  Normal device function. See scanned report. Will follow up in 4 weeks to continue titration with goal of 6-8 milliamps for chronic settings.   CAD Denies s/s ischemia  Disposition:   Follow up with EP APP in 4-6 weeks. After next visit would plan visit ~ 6 month post op mark.   Signed, Graciella Freer, PA-C

## 2023-04-08 ENCOUNTER — Encounter: Payer: Self-pay | Admitting: Student

## 2023-04-08 ENCOUNTER — Ambulatory Visit: Payer: Medicare Other | Attending: Student | Admitting: Student

## 2023-04-08 VITALS — BP 108/66 | HR 79 | Ht 68.0 in | Wt 198.6 lb

## 2023-04-08 DIAGNOSIS — I5020 Unspecified systolic (congestive) heart failure: Secondary | ICD-10-CM

## 2023-04-08 DIAGNOSIS — I2510 Atherosclerotic heart disease of native coronary artery without angina pectoris: Secondary | ICD-10-CM

## 2023-04-08 DIAGNOSIS — I5022 Chronic systolic (congestive) heart failure: Secondary | ICD-10-CM

## 2023-04-08 NOTE — Patient Instructions (Signed)
Medication Instructions:  Your physician recommends that you continue on your current medications as directed. Please refer to the Current Medication list given to you today.  *If you need a refill on your cardiac medications before your next appointment, please call your pharmacy*  Lab Work: None ordered If you have labs (blood work) drawn today and your tests are completely normal, you will receive your results only by: MyChart Message (if you have MyChart) OR A paper copy in the mail If you have any lab test that is abnormal or we need to change your treatment, we will call you to review the results.  Follow-Up: At [facility], you and your health needs are our priority.  As part of our continuing mission to provide you with exceptional heart care, we have created designated Provider Care Teams.  These Care Teams include your primary Cardiologist (physician) and Advanced Practice Providers (APPs -  Physician Assistants and Nurse Practitioners) who all work together to provide you with the care you need, when you need it.   Your next appointment:   As scheduled  Provider:   Casimiro Needle "Otilio Saber, [place]

## 2023-04-09 ENCOUNTER — Encounter (HOSPITAL_BASED_OUTPATIENT_CLINIC_OR_DEPARTMENT_OTHER): Payer: Medicare Other | Admitting: Cardiology

## 2023-04-12 ENCOUNTER — Ambulatory Visit (HOSPITAL_BASED_OUTPATIENT_CLINIC_OR_DEPARTMENT_OTHER): Payer: Medicare Other | Attending: Cardiology | Admitting: Cardiology

## 2023-04-12 VITALS — Ht 68.0 in | Wt 190.0 lb

## 2023-04-12 DIAGNOSIS — R0683 Snoring: Secondary | ICD-10-CM | POA: Insufficient documentation

## 2023-04-12 DIAGNOSIS — R0902 Hypoxemia: Secondary | ICD-10-CM | POA: Diagnosis not present

## 2023-04-14 NOTE — Procedures (Signed)
   Patient Name: Shelly [name], Shelly [name] Date:04/12/2023 Gender: Female D.O.B: June 07, 1971 Age (years): 61 Referring Provider: Armanda Magic MD, ABSM Height (inches): 68 Interpreting Physician: Armanda Magic MD, ABSM Weight (lbs): 190 RPSGT: [place]ug[place], [place] BMI: 29 MRN: [id number]  CLINICAL INFORMATION Sleep Study Type: NPSG  Indication for sleep study: Congestive Heart Failure, Daytime Fatigue, Restless Sleep with Limb Movments, Snoring  Epworth Sleepiness Score: 2  SLEEP STUDY TECHNIQUE As per the AASM Manual for the Scoring of Sleep and Associated Events v2.3 (April 2016) with a hypopnea requiring 4% desaturations.  The channels recorded and monitored were frontal, central and occipital EEG, electrooculogram (EOG), submentalis EMG (chin), nasal and oral airflow, thoracic and abdominal wall motion, anterior tibialis EMG, snore microphone, electrocardiogram, and pulse oximetry.  MEDICATIONS Medications self-administered by patient taken the night of the study : N/A  SLEEP ARCHITECTURE The study was initiated at 9:42:41 PM and ended at 5:00:08 AM.  Sleep onset time was 48.6 minutes and the sleep efficiency was 63.5%. The total sleep time was 277.8 minutes.  Stage REM latency was 230.0 minutes.  The patient spent 19.6% of the night in stage N1 sleep, 56.5% in stage N2 sleep, 6.6% in stage N3 and 17.3% in REM.  Alpha intrusion was absent.  Supine sleep was 0.00%.  RESPIRATORY PARAMETERS The overall apnea/hypopnea index (AHI) was 0.4 per hour. There were 0 total apneas, including 0 obstructive, 0 central and 0 mixed apneas. There were 2 hypopneas and 1 RERAs.  The AHI during Stage REM sleep was 2.5 per hour.  AHI while supine was N/A per hour.  The mean oxygen saturation was 92.1%. The minimum SpO2 during sleep was 88.0%.  soft snoring was noted during this study.  CARDIAC DATA The 2 lead EKG could not be interpreted due to baseline artifact. The mean heart rate was 78.3  beats per minute. Other EKG findings include: None.  LEG MOVEMENT DATA The total PLMS were 0 with a resulting PLMS index of 0.0. Associated arousal with leg movement index was 16.6 .  IMPRESSIONS - No significant obstructive sleep apnea occurred during this study (AHI = 0.4/h). - The patient had minimal or no oxygen desaturation during the study (Min O2 = 88.0%) - The patient snored with soft snoring volume. - EKG could not be interpreted due to baseline artifact. - Clinically, significant periodic limb movements did not occur during sleep. Associated arousals were significant.  DIAGNOSIS - Normal Study  RECOMMENDATIONS - Avoid alcohol, sedatives and other CNS depressants that may worsen sleep apnea and disrupt normal sleep architecture. - Sleep hygiene should be reviewed to assess factors that may improve sleep quality. - Weight management and regular exercise should be initiated or continued if appropriate.  [Electronically signed] 04/14/2023 08:49 PM  Armanda Magic MD, ABSM Diplomate, American Board of Sleep Medicine

## 2023-04-16 ENCOUNTER — Telehealth: Payer: Self-pay | Admitting: Cardiology

## 2023-04-16 ENCOUNTER — Other Ambulatory Visit (HOSPITAL_COMMUNITY): Payer: Self-pay

## 2023-04-16 NOTE — Telephone Encounter (Signed)
Patient would like a call back to discuss sleep study results.

## 2023-04-23 ENCOUNTER — Telehealth: Payer: Self-pay

## 2023-04-23 NOTE — Telephone Encounter (Signed)
-----   Message from Armanda Magic sent at 04/14/2023  8:51 PM EST ----- Please let patient know that sleep study showed no significant sleep apnea.

## 2023-04-23 NOTE — Telephone Encounter (Signed)
Notified patient of sleep study results and recommendations. All questions were answered and patient verbalized understanding.

## 2023-04-24 ENCOUNTER — Other Ambulatory Visit (HOSPITAL_COMMUNITY): Payer: Self-pay

## 2023-04-24 ENCOUNTER — Telehealth (HOSPITAL_COMMUNITY): Payer: Self-pay

## 2023-04-24 NOTE — Progress Notes (Unsigned)
  Cardiology Office Note:   Date:  04/27/2023  ID:  Shelly [name], DOB [birth year], MRN [id number]  EP: Lewayne Bunting, MD  History of Present Illness:   Shelly [name] is a 52 y.o. female seen today for routine electrophysiology followup.   Underwent BAT implantation 01/30/2023  At last visit, Device titrated from 5.0 millamp to 6.0 milliamp by increments of 0.4. Pt had stim at 7.0 ma described as tingling and throbbing sensation in her neck.    Since last being seen in clinic doing well. She continues to improve. She has even less fatigue and is able to walk further. No SOB walking into office today and caring for her grandson. Planning to quit smoking once she "runs out of what she has".  Walking so much some days her thighs are sore the next.   Her goals for device titration remain caring for her grandson and to increase wants to be able to play and take care of him without fatigue.   Device History: Barostim (standard) implanted 01/30/2023 for Chronic systolic CHF [employer] ICD implanted 11/2021 for primary prevention.   Review of systems complete and found to be negative unless listed in HPI.    EP Information / Studies Reviewed:    EKG is not ordered today. EKG from 11/04/2022 reviewed which showed NSR at 97 bpm   Barostim Interrogation- Performed personally and reviewed in detail tdoay,  See scanned report  Physical Exam:   VS:  BP 100/62   Pulse 72   Ht 5\' 8"  (1.727 m)   Wt 200 lb (90.7 kg)   LMP  (LMP Unknown)   BMI 30.41 kg/m    Wt Readings from Last 3 Encounters:  04/27/23 200 lb (90.7 kg)  04/12/23 190 lb (86.2 kg)  04/08/23 198 lb 9.6 oz (90.1 kg)     GEN: Well nourished, well developed in no acute distress NECK: No JVD; No carotid bruits CARDIAC: Regular rate and rhythm, no murmurs, rubs, gallops RESPIRATORY:  Clear to auscultation without rales, wheezing or rhonchi  ABDOMEN: Soft, non-tender, non-distended EXTREMITIES:  No edema; No  deformity     ASSESSMENT AND PLAN:    Chronic systolic CHF s/p Environmental manager and Barostim implantation NYHA II-III symptoms.   Pt had recurrent stim with further attempts at titrating.  Rather than splitting pulse width, will consider 6.0 millamp her chronic setting for now, with the option to adjust further in the future if needed.  Pt previously had stim at 7.0 ma described as tingling and throbbing sensation in her neck.   Device impedence stable Pt goals are to increase her energy level and to be able to play with her soon to be born grandson  Normal device function. See scanned report.  CAD Denies s/s ischemia  Disposition:  Follow up with EP APP in 3 months for 6 month post op follow up. Repeat KCCQ 12 and Update echo at that point.    Signed, Graciella Freer, PA-C

## 2023-04-24 NOTE — Telephone Encounter (Signed)
Advanced Heart Failure Patient Advocate Encounter  Returned patient call inquiring about assistance for Eliquis. Discussed 3% criteria for BMS and patient does not think she would meet the requirement at this time. I informed her that she is welcome to check with the pharmacy for a printout and reach out to myself of Marisue Ivan if she would like to apply in the future.  Burnell Blanks, CPhT Rx Patient Advocate Phone: [phone]

## 2023-04-27 ENCOUNTER — Encounter: Payer: Self-pay | Admitting: Student

## 2023-04-27 ENCOUNTER — Ambulatory Visit: Payer: Medicare Other | Attending: Student | Admitting: Student

## 2023-04-27 ENCOUNTER — Other Ambulatory Visit (HOSPITAL_COMMUNITY): Payer: Self-pay

## 2023-04-27 VITALS — BP 100/62 | HR 72 | Ht 68.0 in | Wt 200.0 lb

## 2023-04-27 DIAGNOSIS — I2510 Atherosclerotic heart disease of native coronary artery without angina pectoris: Secondary | ICD-10-CM | POA: Diagnosis not present

## 2023-04-27 DIAGNOSIS — I5020 Unspecified systolic (congestive) heart failure: Secondary | ICD-10-CM | POA: Diagnosis not present

## 2023-04-27 DIAGNOSIS — I5022 Chronic systolic (congestive) heart failure: Secondary | ICD-10-CM

## 2023-04-27 NOTE — Patient Instructions (Signed)
Medication Instructions:  Your physician recommends that you continue on your current medications as directed. Please refer to the Current Medication list given to you today.  *If you need a refill on your cardiac medications before your next appointment, please call your pharmacy*  Lab Work: None ordered If you have labs (blood work) drawn today and your tests are completely normal, you will receive your results only by: MyChart Message (if you have MyChart) OR A paper copy in the mail If you have any lab test that is abnormal or we need to change your treatment, we will call you to review the results.  Follow-Up: At [facility], you and your health needs are our priority.  As part of our continuing mission to provide you with exceptional heart care, we have created designated Provider Care Teams.  These Care Teams include your primary Cardiologist (physician) and Advanced Practice Providers (APPs -  Physician Assistants and Nurse Practitioners) who all work together to provide you with the care you need, when you need it.  Your next appointment:   3 month(s)  Provider:   Michael "Andy" Tillery, PA-C  

## 2023-04-28 NOTE — Telephone Encounter (Signed)
Advanced Heart Failure Patient Advocate Encounter  Medication Samples have been provided to the patient.  Drug name: Eliquis       Strength: 5mg         Qty: 4 boxes  LOT: [id number]  Exp.Date: 07/2024  Dosing instructions: Take 1 tablet by mouth twice daily.  The patient has been instructed regarding the correct time, dose, and frequency of taking this medication, including desired effects and most common side effects.   Shelly [name] 1:42 PM 04/28/2023

## 2023-05-21 ENCOUNTER — Other Ambulatory Visit (HOSPITAL_BASED_OUTPATIENT_CLINIC_OR_DEPARTMENT_OTHER): Payer: Self-pay

## 2023-05-21 ENCOUNTER — Encounter (HOSPITAL_BASED_OUTPATIENT_CLINIC_OR_DEPARTMENT_OTHER): Payer: Self-pay | Admitting: Student

## 2023-05-21 ENCOUNTER — Telehealth (HOSPITAL_BASED_OUTPATIENT_CLINIC_OR_DEPARTMENT_OTHER): Payer: Self-pay | Admitting: *Deleted

## 2023-05-21 ENCOUNTER — Ambulatory Visit (INDEPENDENT_AMBULATORY_CARE_PROVIDER_SITE_OTHER): Payer: Medicare Other | Admitting: Student

## 2023-05-21 VITALS — BP 105/69 | HR 77 | Temp 97.6°F | Ht 68.0 in | Wt 195.6 lb

## 2023-05-21 DIAGNOSIS — C44319 Basal cell carcinoma of skin of other parts of face: Secondary | ICD-10-CM

## 2023-05-21 DIAGNOSIS — H348192 Central retinal vein occlusion, unspecified eye, stable: Secondary | ICD-10-CM | POA: Insufficient documentation

## 2023-05-21 DIAGNOSIS — Z7985 Long-term (current) use of injectable non-insulin antidiabetic drugs: Secondary | ICD-10-CM

## 2023-05-21 DIAGNOSIS — E119 Type 2 diabetes mellitus without complications: Secondary | ICD-10-CM

## 2023-05-21 DIAGNOSIS — I5020 Unspecified systolic (congestive) heart failure: Secondary | ICD-10-CM

## 2023-05-21 DIAGNOSIS — E113523 Type 2 diabetes mellitus with proliferative diabetic retinopathy with traction retinal detachment involving the macula, bilateral: Secondary | ICD-10-CM | POA: Diagnosis not present

## 2023-05-21 DIAGNOSIS — R600 Localized edema: Secondary | ICD-10-CM | POA: Insufficient documentation

## 2023-05-21 DIAGNOSIS — Z7689 Persons encountering health services in other specified circumstances: Secondary | ICD-10-CM

## 2023-05-21 DIAGNOSIS — M25811 Other specified joint disorders, right shoulder: Secondary | ICD-10-CM

## 2023-05-21 DIAGNOSIS — R300 Dysuria: Secondary | ICD-10-CM | POA: Insufficient documentation

## 2023-05-21 DIAGNOSIS — N3000 Acute cystitis without hematuria: Secondary | ICD-10-CM | POA: Diagnosis not present

## 2023-05-21 LAB — POCT URINALYSIS DIP (MANUAL ENTRY)
Bilirubin, UA: NEGATIVE
Glucose, UA: 100 mg/dL — AB
Ketones, POC UA: NEGATIVE mg/dL
Nitrite, UA: NEGATIVE
Spec Grav, UA: 1.015 (ref 1.010–1.025)
Urobilinogen, UA: 0.2 U/dL
pH, UA: 6 (ref 5.0–8.0)

## 2023-05-21 MED ORDER — CEPHALEXIN 500 MG PO CAPS
500.0000 mg | ORAL_CAPSULE | Freq: Two times a day (BID) | ORAL | 0 refills | Status: AC
  Filled 2023-05-21: qty 14, 7d supply, fill #0

## 2023-05-21 MED ORDER — TRULICITY 3 MG/0.5ML ~~LOC~~ SOAJ
3.0000 mg | SUBCUTANEOUS | 0 refills | Status: DC
  Filled 2023-05-21: qty 2, 28d supply, fill #0
  Filled 2023-06-23 (×3): qty 6, 84d supply, fill #0

## 2023-05-21 NOTE — Progress Notes (Signed)
New Patient Office Visit  Subjective    Patient ID: Shelly [name], female    DOB: 24-Aug-1970  Age: 52 y.o. MRN: [id number]  CC:  Chief Complaint  Patient presents with   Establish Care    HPI Shelly [name] presents to establish care. Prior PCP was at [facility]. She notes that she requires refills of Trulicity (in donut hole, send at first of the year).  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 No longer sees ophthalmologist. Diabetes- Currently only on dulaglutide. Notes no SE on medication. Took 2 weeks ago for the last time due to donut hole. Will refill [facility]. CHF- NYHA Class II-III Heart Failure. Latest EF as 20-25%. Has Barostim implant. Currently seen by [facility]. Stable. History of MI.   FH: BC in aunt maternal (deceased), MgM BC, Mother (Heart disease)  Tobacco use: Yes, has cut back. Not interested in patches. <1/2 ppd for 37. Alcohol use: no Drug use: no Marital status: married Employment: Disabled Sexual hx: no. Sees gyn January 2nd.  Feels that she likely has a prolapse.  History of dyspareunia.   Screenings:  Kidney evaluation: requesting labs. Colon Cancer: Decline. Lung Cancer: Decline. Breast Cancer/Cervical: Mammo (2023), PAP 1 years ago.  Will get screening exams with gynecologist. Diabetes: 6 months.  Requesting labs. HLD: 08/14/22 Lipid panel -requesting further labs. Currently on Repatha.  The 10-year ASCVD risk score (Arnett DK, et al., 2019) is: 5.9%  Acute Problems:  Hold up has been told that she has arthritis in her right shoulder. Patient notes that right "bicep" pain started around the time of the Barostim implantation.  Will patient tells that the pain radiates to her bicep, and a shocklike pattern.  Discussed possible causes.  Educated on conservative therapy.  Discussed rationale for abstinence from NSAIDs.  Will address further at next visit.   Will try to obtain prior shoulder x-ray.  UTI- Dysuria? yes Blood in urine? unsure Frequency? On lasix, so she is unsure. Abnormal color or odor of urine? no Days of symptoms? saturday Any fever? no Nausea?no Vomiting? no Back pain? no Itchiness?  No (+) Leukocytes on POC UA  Forehead- patient notes that she has had a scab that has recurrently bled on her upper left forehead. Feels that she has had it for at least 1 year.   I spent greater then 45 minutes with this patient working on patient education, chart, and diagnostics.  Outpatient Encounter Medications as of 05/21/2023  Medication Sig   albuterol (VENTOLIN HFA) 108 (90 Base) MCG/ACT inhaler Inhale 2 puffs into the lungs every 4 - 6 hours as needed for wheezing.   apixaban (ELIQUIS) 5 MG TABS tablet Take 1 tablet (5 mg total) by mouth 2 (two) times daily.   carvedilol (COREG) 3.125 MG tablet Take 1 tablet (3.125 mg total) by mouth 2 (two) times daily.   cephALEXin (KEFLEX) 500 MG capsule Take 1 capsule (500 mg total) by mouth 2 (two) times daily for 7 days.   cholecalciferol (VITAMIN D) 25 MCG (1000 UNIT) tablet Take 1,000 Units by mouth in the morning.   dapagliflozin propanediol (FARXIGA) 10 MG TABS tablet Take 1 tablet (10 mg total) by mouth daily.   diclofenac Sodium (GOODSENSE ARTHRITIS PAIN) 1 % GEL Apply 2 g topically 4 (four) times daily for 7 days to single elbow, wrist or hand; for hand includes palm/fingers/back of hand.   Evolocumab (REPATHA SURECLICK)  140 MG/ML SOAJ Inject 1 Dose into the skin every 14 (fourteen) days.   furosemide (LASIX) 20 MG tablet Take 1 tablet (20 mg total) by mouth 3 (three) times a week.   losartan (COZAAR) 25 MG tablet Take 1 tablet (25 mg total) by mouth at bedtime.   Melatonin 10 MG TABS Take 10 mg by mouth at bedtime as needed (sleep.).   ondansetron (ZOFRAN) 4 MG tablet Take 1 tablet (4 mg total) by mouth every 8 (eight) hours as needed for nausea or vomiting.   ondansetron  (ZOFRAN-ODT) 4 MG disintegrating tablet Dissolve 1 tablet (4 mg total) in mouth every 4-6 hours as needed for nausea for 7 days.   Phenylephrine-Acetaminophen (SINUS CONGESTION/PAIN PO) Take 2 tablets by mouth every 6 (six) hours as needed (sinus).   rOPINIRole (REQUIP) 0.5 MG tablet Take 0.5 mg by mouth at bedtime as needed.   spironolactone (ALDACTONE) 25 MG tablet Take 1 tablet (25 mg total) by mouth daily.   [DISCONTINUED] Dulaglutide (TRULICITY) 3 MG/0.5ML SOPN Inject 3 mg into the skin once a week.   Dulaglutide (TRULICITY) 3 MG/0.5ML SOAJ Inject 3 mg into the skin once a week.   No facility-administered encounter medications on file as of 05/21/2023.    Past Medical History:  Diagnosis Date   AICD (automatic cardioverter/defibrillator) present    Anemia    Anxiety    Arthritis    Cataract    Mixed OU   CHF (congestive heart failure) (HCC)    Coronary artery disease    Diabetes mellitus without complication (HCC)    type 2   Diabetic retinopathy (HCC)    PDR OU   Dizziness    Headache    Cluster headaches in the past   History of kidney stones    "2 sitting"   Hyperlipidemia    Hypertension    Hypertensive retinopathy    OU   Myocardial infarction (HCC)    Pneumonia    Syncope    Tobacco abuse     Past Surgical History:  Procedure Laterality Date   EYE SURGERY Left 08/23/2020   PPV - Dr. [name]   ICD IMPLANT N/A 11/21/2021   Procedure: ICD IMPLANT;  Surgeon: Marinus Maw, MD;  Location: [facility] INVASIVE CV LAB;  Service: Cardiovascular;  Laterality: N/A;   INJECTION OF SILICONE OIL Left 08/23/2020   Procedure: INJECTION OF SILICONE OIL;  Surgeon: [name], MD;  Location: [facility] OR;  Service: Ophthalmology;  Laterality: Left;   INJECTION OF SILICONE OIL Right 10/04/2020   Procedure: INJECTION OF SILICONE OIL;  Surgeon: [name], MD;  Location: [facility] OR;  Service: Ophthalmology;  Laterality: Right;   MEMBRANE PEEL Left 08/23/2020   Procedure: MEMBRANE  PEEL;  Surgeon: [name], MD;  Location: [facility] OR;  Service: Ophthalmology;  Laterality: Left;   MEMBRANE PEEL Right 10/04/2020   Procedure: MEMBRANE PEEL;  Surgeon: [name], MD;  Location: [facility] OR;  Service: Ophthalmology;  Laterality: Right;   PARS PLANA VITRECTOMY Left 08/23/2020   Procedure: PARS PLANA VITRECTOMY WITH 25 GAUGE WITH ENDOLASER;  Surgeon: [name], MD;  Location: [facility] OR;  Service: Ophthalmology;  Laterality: Left;   PARS PLANA VITRECTOMY Right 10/04/2020   Procedure: PARS PLANA VITRECTOMY WITH 25 GAUGE, INJECTION OF AVASTIN;  Surgeon: [name], MD;  Location: [facility] OR;  Service: Ophthalmology;  Laterality: Right;   PHOTOCOAGULATION WITH LASER Right 10/04/2020   Procedure: PHOTOCOAGULATION WITH LASER;  Surgeon: [name], MD;  Location: [facility] OR;  Service: Ophthalmology;  Laterality: Right;   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/23/2020   Procedure: REPAIR OF COMPLEX TRACTION RETINAL DETACHMENT;  Surgeon: [name], MD;  Location: [facility] OR;  Service: Ophthalmology;  Laterality: Left;   RIGHT/LEFT HEART CATH AND CORONARY ANGIOGRAPHY N/A 10/14/2019   Procedure: RIGHT/LEFT HEART CATH AND CORONARY ANGIOGRAPHY;  Surgeon: Dolores Patty, MD;  Location: [facility] INVASIVE CV LAB;  Service: Cardiovascular;  Laterality: N/A;   TUBAL LIGATION      Family History  Problem Relation Age of Onset   Sudden Cardiac Death Mother    CAD Father    CAD Paternal Grandmother    CAD Paternal Grandfather     Social History   Socioeconomic History   Marital status: Married    Spouse name: Not on file   Number of children: Not on file   Years of education: Not on file   Highest education level: Not on file  Occupational History   Not on file  Tobacco Use   Smoking status: Every Day    Current packs/day: 0.50    Types: Cigarettes   Smokeless tobacco: Never  Vaping Use   Vaping status: Never Used  Substance and Sexual Activity   Alcohol use: Not Currently    Drug use: Never   Sexual activity: Not on file  Other Topics Concern   Not on file  Social History Narrative   Not on file   Social Drivers of Health   Financial Resource Strain: Not on file  Food Insecurity: Not on file  Transportation Needs: Not on file  Physical Activity: Not on file  Stress: Not on file  Social Connections: Not on file  Intimate Partner Violence: Not on file    ROS Per hpi     Objective    BP 105/69   Pulse 77   Temp 97.6 F (36.4 C) (Oral)   Ht 5\' 8"  (1.727 m)   Wt 195 lb 9.6 oz (88.7 kg)   LMP  (LMP Unknown)   SpO2 96%   BMI 29.74 kg/m   Physical Exam Constitutional:      General: She is not in acute distress.    Appearance: Normal appearance. She is not ill-appearing or diaphoretic.  HENT:     Head: Normocephalic and atraumatic.     Right Ear: External ear normal.     Left Ear: External ear normal.     Nose: Nose normal.  Eyes:     General: No scleral icterus.    Extraocular Movements: Extraocular movements intact.     Conjunctiva/sclera: Conjunctivae normal.     Pupils: Pupils are equal, round, and reactive to light.  Neck:     Thyroid: No thyroid mass, thyromegaly or thyroid tenderness.     Vascular: No carotid bruit.  Cardiovascular:     Rate and Rhythm: Normal rate.  Pulmonary:     Effort: Pulmonary effort is normal. No respiratory distress.     Breath sounds: No stridor.  Musculoskeletal:     Cervical back: Neck supple.     Comments: Known pedal edema Slight pain and weakness with empty can test. Right shoulder-maximum arc of 90 degrees compared to full 180 of the left shoulder.   Lymphadenopathy:     Cervical: No cervical adenopathy.     Right cervical: No superficial or posterior cervical adenopathy.    Left cervical: No superficial cervical adenopathy.  Skin:    Coloration: Skin is not jaundiced.     Findings:  Lesion present.  Neurological:     General: No focal deficit present.     Mental Status: She is alert  and oriented to person, place, and time.     Motor: No weakness.  Psychiatric:        Behavior: Behavior normal.        Assessment & Plan:   Encounter to establish care  Systolic congestive heart failure, unspecified HF chronicity (HCC) Assessment & Plan: Follows cardiology.  Continue current regimen.  Stable.   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  Will assess further at next visit.  Orders: -     Trulicity; Inject 3 mg into the skin once a week.  Dispense: 6 mL; Refill: 0 -     POCT urinalysis dipstick  Acute cystitis without hematuria -     POCT urinalysis dipstick -     Cephalexin; Take 1 capsule (500 mg total) by mouth 2 (two) times daily for 7 days.  Dispense: 14 capsule; Refill: 0  Basal cell carcinoma (BCC) of left forehead Assessment & Plan: I suspect that it could be a basal cell carcinoma-referral to dermatology for confirmation.  1 year history of scabbing and bleeding.  Suspect that this will need to be excised.  About 1 cm in length, rolled edges, with telangiectasias.  Orders: -     Ambulatory referral to Dermatology  Shoulder impingement, right Assessment & Plan: Differential diagnosis includes glenohumeral arthritis, frozen shoulder, or peripheral neuropathy/combination. Conservative management including continuance of Voltaren gel until follow-up in January. Will discuss any further imaging at next visit.     Return in about 6 weeks (around 07/02/2023) for Chronic Followup and shoulder pain.   Teryl Lucy Hayato Guaman, PA-C

## 2023-05-21 NOTE — Patient Instructions (Signed)
It was nice to see you today!  As we discussed in clinic if your dipstick comes back as having a UTI, I will send in an antibiotic.  I will see you back in later January to follow-up on your upper extremity pain and impingement.  We will discuss screening exams again.  I have sent in a referral for dermatology to have the spot on your head removed.  If you have any problems before your next visit feel free to message me via MyChart (minor issues or questions) or call the office, otherwise you may reach out to schedule an office visit.  Thank you!  Patient Shelly Montane, PA-C

## 2023-05-21 NOTE — Telephone Encounter (Signed)
Pt called to check on the status of her urine that was tested.  Spoke with PCP and he stated that it does show that she has a UTI and that the medicine had been sent to the pharmacy.  She confirmed that she had picked up the medication.

## 2023-05-21 NOTE — Assessment & Plan Note (Signed)
Refill sent in for Trulicity, due to Medicare patient plans to pick this up in January.  Will assess further at next visit.

## 2023-05-21 NOTE — Assessment & Plan Note (Addendum)
Differential diagnosis includes glenohumeral arthritis, frozen shoulder, or peripheral neuropathy/combination. Conservative management including continuance of Voltaren gel until follow-up in January. Will discuss any further imaging at next visit.

## 2023-05-21 NOTE — Assessment & Plan Note (Signed)
Follows cardiology.  Continue current regimen.  Stable.

## 2023-05-21 NOTE — Assessment & Plan Note (Deleted)
Follows cardiology.  Continue current regimen.  Stable.

## 2023-05-21 NOTE — Assessment & Plan Note (Addendum)
I suspect that it could be a basal cell carcinoma-referral to dermatology for confirmation.  1 year history of scabbing and bleeding.  Suspect that this will need to be excised.  About 1 cm in length, rolled edges, with telangiectasias.

## 2023-05-22 ENCOUNTER — Ambulatory Visit: Payer: Medicare Other

## 2023-05-22 DIAGNOSIS — I255 Ischemic cardiomyopathy: Secondary | ICD-10-CM | POA: Diagnosis not present

## 2023-05-23 LAB — CUP PACEART REMOTE DEVICE CHECK
Battery Remaining Longevity: 174 mo
Battery Remaining Percentage: 100 %
Brady Statistic RV Percent Paced: 0 %
Date Time Interrogation Session: 20241213002100
HighPow Impedance: 99 Ohm
Implantable Lead Connection Status: 753985
Implantable Lead Implant Date: 20230615
Implantable Lead Location: 753860
Implantable Lead Model: 137
Implantable Lead Serial Number: 301168
Implantable Pulse Generator Implant Date: 20230615
Lead Channel Impedance Value: 568 Ohm
Lead Channel Pacing Threshold Amplitude: 0.9 V
Lead Channel Pacing Threshold Pulse Width: 0.4 ms
Lead Channel Setting Pacing Amplitude: 2.5 V
Lead Channel Setting Pacing Pulse Width: 0.4 ms
Lead Channel Setting Sensing Sensitivity: 0.5 mV
Pulse Gen Serial Number: 217479
Zone Setting Status: 755011

## 2023-05-26 ENCOUNTER — Other Ambulatory Visit: Payer: Self-pay

## 2023-05-26 ENCOUNTER — Other Ambulatory Visit (HOSPITAL_COMMUNITY): Payer: Self-pay

## 2023-06-05 ENCOUNTER — Encounter (HOSPITAL_BASED_OUTPATIENT_CLINIC_OR_DEPARTMENT_OTHER): Payer: Self-pay | Admitting: Student

## 2023-06-05 ENCOUNTER — Other Ambulatory Visit (HOSPITAL_COMMUNITY): Payer: Self-pay

## 2023-06-05 ENCOUNTER — Other Ambulatory Visit (HOSPITAL_BASED_OUTPATIENT_CLINIC_OR_DEPARTMENT_OTHER): Payer: Self-pay | Admitting: Student

## 2023-06-05 ENCOUNTER — Telehealth (HOSPITAL_BASED_OUTPATIENT_CLINIC_OR_DEPARTMENT_OTHER): Payer: Self-pay | Admitting: Student

## 2023-06-05 DIAGNOSIS — N3001 Acute cystitis with hematuria: Secondary | ICD-10-CM | POA: Insufficient documentation

## 2023-06-05 MED ORDER — SULFAMETHOXAZOLE-TRIMETHOPRIM 800-160 MG PO TABS
1.0000 | ORAL_TABLET | Freq: Two times a day (BID) | ORAL | 0 refills | Status: AC
  Filled 2023-06-05: qty 14, 7d supply, fill #0

## 2023-06-05 NOTE — Progress Notes (Signed)
UTI not responding to Keflex, sent in Bactrim.

## 2023-06-05 NOTE — Telephone Encounter (Signed)
Patient states UTI is still not going away and needs a refill or another medication. Please refill at [facility]. Please advise patient.

## 2023-06-18 ENCOUNTER — Ambulatory Visit: Payer: Medicare Other | Admitting: Dermatology

## 2023-06-18 ENCOUNTER — Encounter: Payer: Self-pay | Admitting: Dermatology

## 2023-06-18 VITALS — BP 122/80 | HR 69

## 2023-06-18 DIAGNOSIS — D492 Neoplasm of unspecified behavior of bone, soft tissue, and skin: Secondary | ICD-10-CM

## 2023-06-18 DIAGNOSIS — D485 Neoplasm of uncertain behavior of skin: Secondary | ICD-10-CM

## 2023-06-18 DIAGNOSIS — C44319 Basal cell carcinoma of skin of other parts of face: Secondary | ICD-10-CM

## 2023-06-18 NOTE — Progress Notes (Signed)
   New Patient Visit   Subjective  Shelly [name] is a 53 y.o. female who presents for the following: Spot on forehead.  No hx of skin cancer and no family hx.  Lesion has been present for several months. It has bled and is painful. It has not been previously treated.   Patient's husband is with the patient. Patient is visually impaired.  The patient has spots, moles and lesions to be evaluated, some may be new or changing and the patient may have concern these could be cancer.  The following portions of the chart were reviewed this encounter and updated as appropriate: medications, allergies, medical history  Review of Systems:  No other skin or systemic complaints except as noted in HPI or Assessment and Plan.  Objective  Well appearing patient in no apparent distress; mood and affect are within normal limits.   A focused examination was performed of the following areas:  Forehead  Relevant exam findings are noted in the Assessment and Plan. 1.2 x 1.3 cm pearly papule with hemorrhagic crusting and pigment globules    Assessment & Plan   NEOPLASM OF UNCERTAIN BEHAVIOR OF SKIN Left Forehead Skin / nail biopsy Type of biopsy: tangential   Informed consent: discussed and consent obtained   Timeout: patient name, date of birth, surgical site, and procedure verified   Anesthesia: the lesion was anesthetized in a standard fashion   Anesthetic:  1% lidocaine  w/ epinephrine  1-100,000 buffered w/ 8.4% NaHCO3 Instrument used: DermaBlade   Hemostasis achieved with: aluminum chloride   Outcome: patient tolerated procedure well   Post-procedure details: sterile dressing applied and wound care instructions given   Dressing type: petrolatum and bandage    Return if symptoms worsen or fail to improve.  Shelly [name], Surg Tech III, am acting as scribe for Shelly CHRISTELLA HOLY, MD.    Documentation: I have reviewed the above documentation for accuracy and completeness, and I agree with  the above.  Shelly CHRISTELLA HOLY, MD

## 2023-06-18 NOTE — Patient Instructions (Signed)
 Important Information  Due to recent changes in healthcare laws, you may see results of your pathology and/or laboratory studies on MyChart before the doctors have had a chance to review them. We understand that in some cases there may be results that are confusing or concerning to you. Please understand that not all results are received at the same time and often the doctors may need to interpret multiple results in order to provide you with the best plan of care or course of treatment. Therefore, we ask that you please give [place] 2 business days to thoroughly review all your results before contacting the office for clarification. Should we see a critical lab result, you will be contacted sooner.   If You Need Anything After Your Visit  If you have any questions or concerns for your doctor, please call our main line at [phone] If no one answers, please leave a voicemail as directed and we will return your call as soon as possible. Messages left after 4 pm will be answered the following business day.   You may also send [place] a message via MyChart. We typically respond to MyChart messages within 1-2 business days.  For prescription refills, please ask your pharmacy to contact our office. Our fax number is [phone].  If you have an urgent issue when the clinic is closed that cannot wait until the next business day, you can page your doctor at the number below.    Please note that while we do our best to be available for urgent issues outside of office hours, we are not available 24/7.   If you have an urgent issue and are unable to reach [place], you may choose to seek medical care at your doctor's office, retail clinic, urgent care center, or emergency room.  If you have a medical emergency, please immediately call 911 or go to the emergency department. In the event of inclement weather, please call our main line at [phone] for an update on the status of any delays or  closures.  Dermatology Medication Tips: Please keep the boxes that topical medications come in in order to help keep track of the instructions about where and how to use these. Pharmacies typically print the medication instructions only on the boxes and not directly on the medication tubes.   If your medication is too expensive, please contact our office at [phone] or send [place] a message through MyChart.   We are unable to tell what your co-pay for medications will be in advance as this is different depending on your insurance coverage. However, we may be able to find a substitute medication at lower cost or fill out paperwork to get insurance to cover a needed medication.   If a prior authorization is required to get your medication covered by your insurance company, please allow [place] 1-2 business days to complete this process.  Drug prices often vary depending on where the prescription is filled and some pharmacies may offer cheaper prices.  The website www.goodrx.com contains coupons for medications through different pharmacies. The prices here do not account for what the cost may be with help from insurance (it may be cheaper with your insurance), but the website can give you the price if you did not use any insurance.  - You can print the associated coupon and take it with your prescription to the pharmacy.  - You may also stop by our office during regular business hours and pick up a GoodRx coupon card.  - If  you need your prescription sent electronically to a different pharmacy, notify our office through [facility] or by phone at [phone]     Patient Handout: Wound Care for Skin Biopsy Site  Taking Care of Your Skin Biopsy Site  Proper care of the biopsy site is essential for promoting healing and minimizing scarring. This handout provides instructions on how to care for your biopsy site to ensure optimal recovery.  1. Cleaning the Wound:  Clean the biopsy site daily  with gentle soap and water. Gently pat the area dry with a clean, soft towel. Avoid harsh scrubbing or rubbing the area, as this can irritate the skin and delay healing.  2. Applying Aquaphor and Bandage:  After cleaning the wound, apply a thin layer of Aquaphor ointment to the biopsy site. Cover the area with a sterile bandage to protect it from dirt, bacteria, and friction. Change the bandage daily or as needed if it becomes soiled or wet.  3. Continued Care for One Week:  Repeat the cleaning, Aquaphor application, and bandaging process daily for one week following the biopsy procedure. Keeping the wound clean and moist during this initial healing period will help prevent infection and promote optimal healing.  4. Massaging Aquaphor into the Area:  ---After one week, discontinue the use of bandages but continue to apply Aquaphor to the biopsy site. ----Gently massage the Aquaphor into the area using circular motions. ---Massaging the skin helps to promote circulation and prevent the formation of scar tissue.   Additional Tips:  Avoid exposing the biopsy site to direct sunlight during the healing process, as this can cause hyperpigmentation or worsen scarring. If you experience any signs of infection, such as increased redness, swelling, warmth, or drainage from the wound, contact your healthcare provider immediately. Follow any additional instructions provided by your healthcare provider for caring for the biopsy site and managing any discomfort. Conclusion:  Taking proper care of your skin biopsy site is crucial for ensuring optimal healing and minimizing scarring. By following these instructions for cleaning, applying Aquaphor, and massaging the area, you can promote a smooth and successful recovery. If you have any questions or concerns about caring for your biopsy site, don't hesitate to contact your healthcare provider for guidance.

## 2023-06-22 LAB — SURGICAL PATHOLOGY

## 2023-06-23 ENCOUNTER — Other Ambulatory Visit (HOSPITAL_COMMUNITY): Payer: Self-pay

## 2023-06-23 ENCOUNTER — Other Ambulatory Visit: Payer: Self-pay

## 2023-06-24 ENCOUNTER — Telehealth: Payer: Self-pay

## 2023-06-24 ENCOUNTER — Telehealth: Payer: Self-pay | Admitting: Dermatology

## 2023-06-24 NOTE — Telephone Encounter (Signed)
-----   Message from [facility] sent at 06/23/2023  2:11 PM EST ----- BCC- left forehead- Mohs with me  Please call patient to discuss diagnosis and schedule for Mohs surgery.

## 2023-06-24 NOTE — Telephone Encounter (Signed)
 Results discussed with pt

## 2023-06-24 NOTE — Telephone Encounter (Signed)
 I called and spoke to the pt about her pathology results and what to expect for surgery.  She stated it was ok to send to scheduling.

## 2023-06-25 ENCOUNTER — Other Ambulatory Visit (HOSPITAL_BASED_OUTPATIENT_CLINIC_OR_DEPARTMENT_OTHER): Payer: Self-pay

## 2023-06-25 ENCOUNTER — Ambulatory Visit: Payer: Medicare Other | Admitting: Dermatology

## 2023-06-25 ENCOUNTER — Encounter: Payer: Self-pay | Admitting: Dermatology

## 2023-06-25 ENCOUNTER — Telehealth: Payer: Self-pay | Admitting: Dermatology

## 2023-06-25 VITALS — BP 107/62 | Temp 97.9°F

## 2023-06-25 DIAGNOSIS — C4491 Basal cell carcinoma of skin, unspecified: Secondary | ICD-10-CM

## 2023-06-25 DIAGNOSIS — C44310 Basal cell carcinoma of skin of unspecified parts of face: Secondary | ICD-10-CM

## 2023-06-25 DIAGNOSIS — C44319 Basal cell carcinoma of skin of other parts of face: Secondary | ICD-10-CM | POA: Diagnosis not present

## 2023-06-25 DIAGNOSIS — L814 Other melanin hyperpigmentation: Secondary | ICD-10-CM

## 2023-06-25 DIAGNOSIS — L579 Skin changes due to chronic exposure to nonionizing radiation, unspecified: Secondary | ICD-10-CM

## 2023-06-25 MED ORDER — OXYCODONE HCL 5 MG PO TABS
5.0000 mg | ORAL_TABLET | Freq: Four times a day (QID) | ORAL | 0 refills | Status: DC | PRN
  Filled 2023-06-25: qty 8, 2d supply, fill #0

## 2023-06-25 MED ORDER — OXYCODONE HCL 5 MG PO TABS: 5.0000 mg | ORAL_TABLET | Freq: Four times a day (QID) | ORAL | 0 refills | Status: DC | PRN

## 2023-06-25 NOTE — Addendum Note (Signed)
Addended by: Elease Etienne A on: 06/25/2023 09:05 AM   Modules accepted: Orders

## 2023-06-25 NOTE — Progress Notes (Signed)
Follow-Up Visit   Subjective  Shelly [name] is a 53 y.o. female who presents for the following: Mohs of a nodular Basal Cell Carcinoma on the left forehead. She is accompanied by her daughter in law.  The following portions of the chart were reviewed this encounter and updated as appropriate: medications, allergies, medical history  Review of Systems:  No other skin or systemic complaints except as noted in HPI or Assessment and Plan.  Objective  Well appearing patient in no apparent distress; mood and affect are within normal limits.  A focused examination was performed of the following areas: Left forehead Relevant physical exam findings are noted in the Assessment and Plan.     Assessment & Plan   BASAL CELL CARCINOMA (BCC), UNSPECIFIED SITE Left Forehead Mohs surgery  Consent obtained: written  Anticoagulation: Is the patient taking prescription anticoagulant and/or aspirin prescribed/recommended by a physician? Yes   Was the anticoagulation regimen changed prior to Mohs? No    Procedure Details: Surgical site (from skin exam): Left Forehead Pre-operative length (cm): 2 Pre-operative width (cm): 1.6  Micrographic Surgery Details: Post-operative length (cm): 2.1 Post-operative width (cm): 2 Number of Mohs stages: 1  Skin repair Complexity:  Complex Final length (cm):  4.8 Subcutaneous layers (deep stitches):  Suture size:  5-0 Suture type: Monocryl (poliglecaprone 25)   Fine/surface layer approximation (top stitches):  Suture size:  5-0 Suture type: fast-absorbing plain gut     Return in about 5 weeks (around 07/30/2023) for wound check.  Owens Shark, CMA, am acting as scribe for Gwenith Daily, MD.    06/25/2023  HISTORY OF PRESENT ILLNESS  Shelly [name] is seen in consultation at the request of Dr. Caralyn Guile for biopsy-proven Nodular Basal Cell Carcinoma of the left forehead. They note that the area has been present for about 1 year increasing in  size with time.  There is no history of previous treatment.  Reports no other new or changing lesions and has no other complaints today.  Medications and allergies: see patient chart.  Review of systems: Reviewed 8 systems and notable for the above skin cancer.  All other systems reviewed are unremarkable/negative, unless noted in the HPI. Past medical history, surgical history, family history, social history were also reviewed and are noted in the chart/questionnaire.    PHYSICAL EXAMINATION  General: Well-appearing, in no acute distress, alert and oriented x 4. Vitals reviewed in chart (if available).   Skin: Exam reveals a 2 x 1.6 cm erythematous papule and biopsy scar on the left forehead. There are rhytids, telangiectasias, and lentigines, consistent with photodamage.   Biopsy report(s) reviewed, confirming the diagnosis.   ASSESSMENT  1) Nodular Basal Cell Carcinoma of the left forehead 2) photodamage 3) solar lentigines   PLAN   1. Due to location, size, histology, or recurrence and the likelihood of subclinical extension as well as the need to conserve normal surrounding tissue, the patient was deemed acceptable for Mohs micrographic surgery (MMS).  The nature and purpose of the procedure, associated benefits and risks including recurrence and scarring, possible complications such as pain, infection, and bleeding, and alternative methods of treatment if appropriate were discussed with the patient during consent. The lesion location was verified by the patient, by reviewing previous notes, pathology reports, and by photographs as well as angulation measurements if available.  Informed consent was reviewed and signed by the patient, and timeout was performed at 8:45 AM. See op note below.  2. For  the photodamage and solar lentigines, sun protection discussed/information given on OTC sunscreens, and we recommend continued regular follow-up with primary dermatologist every 6 months or  sooner for any growing, bleeding, or changing lesions. 3. Prognosis and future surveillance discussed. 4. Letter with treatment outcome sent to referring provider. 5. Pain acetaminophen/oxycodone 5 mg   MOHS MICROGRAPHIC SURGERY AND RECONSTRUCTION  Initial size:   2 x 1.6 cm Surgical defect/wound size: 2.1 x 2.0 cm Anesthesia:    0.33% lidocaine with 1:200,000 epinephrine EBL:    <5 mL Complications:  None Repair type:   Complex SQ suture:   5-0 Monocryl Cutaneous suture:  5-0 Fast Absorbing Gut Final size of the repair: 4.8 cm  Stages: 1  STAGE I: Anesthesia achieved with 0.5% lidocaine with 1:200,000 epinephrine. ChloraPrep applied. 1 section(s) excised using Mohs technique (this includes total peripheral and deep tissue margin excision and evaluation with frozen sections, excised and interpreted by the same physician). The tumor was first debulked and then excised with an approx. 2mm margin.  Hemostasis was achieved with electrocautery as needed.  The specimen was then oriented, subdivided/relaxed, inked, and processed using Mohs technique.    Frozen section analysis revealed a clear deep and peripheral margin.  Reconstruction  The surgical wound was then cleaned, prepped, and re-anesthetized as above. Wound edges were undermined extensively along at least one entire edge and at a distance equal to or greater than the width of the defect (see wound defect size above) in order to achieve closure and decrease wound tension and anatomic distortion. Redundant tissue repair including standing cone removal was performed. Hemostasis was achieved with electrocautery. Subcutaneous and epidermal tissues were approximated with the above sutures. The surgical site was then lightly scrubbed with sterile, saline-soaked gauze. The area was then bandaged using Vaseline ointment, non-adherent gauze, gauze pads, and tape to provide an adequate pressure dressing. The patient tolerated the procedure well,  was given detailed written and verbal wound care instructions, and was discharged in good condition.   The patient will follow-up: 4 weeks.   Documentation: I have reviewed the above documentation for accuracy and completeness, and I agree with the above.  Gwenith Daily, MD

## 2023-06-25 NOTE — Telephone Encounter (Signed)
Patient called stating that her prescription was not at the pharmacy when she went to pick it up at [facility] , patient asked if the the prescription could be sent to [facility] at [facility] .   [address] [place],  [place]  [address] [phone]

## 2023-06-25 NOTE — Progress Notes (Signed)
Remote ICD transmission.   

## 2023-06-25 NOTE — Patient Instructions (Signed)

## 2023-06-26 ENCOUNTER — Telehealth (HOSPITAL_BASED_OUTPATIENT_CLINIC_OR_DEPARTMENT_OTHER): Payer: Self-pay | Admitting: Student

## 2023-06-26 NOTE — Telephone Encounter (Signed)
Patient called stating the prescription for oxycodone is making her nauseas. Patient is requesting another alternative for the pain she is having. The dermatologists office is closed today so the patient called our office. Please advise.  oxyCODONE (OXY IR/ROXICODONE) 5 MG immediate release tablet [[id number]]

## 2023-06-29 ENCOUNTER — Other Ambulatory Visit (HOSPITAL_BASED_OUTPATIENT_CLINIC_OR_DEPARTMENT_OTHER): Payer: Self-pay | Admitting: Student

## 2023-06-29 DIAGNOSIS — R110 Nausea: Secondary | ICD-10-CM

## 2023-06-29 DIAGNOSIS — G8918 Other acute postprocedural pain: Secondary | ICD-10-CM

## 2023-06-29 MED ORDER — ONDANSETRON HCL 4 MG PO TABS
4.0000 mg | ORAL_TABLET | Freq: Three times a day (TID) | ORAL | 0 refills | Status: DC | PRN
  Filled 2023-06-29: qty 20, 7d supply, fill #0

## 2023-06-30 ENCOUNTER — Encounter: Payer: Self-pay | Admitting: Dermatology

## 2023-06-30 ENCOUNTER — Other Ambulatory Visit (HOSPITAL_COMMUNITY): Payer: Self-pay

## 2023-07-02 ENCOUNTER — Ambulatory Visit (INDEPENDENT_AMBULATORY_CARE_PROVIDER_SITE_OTHER): Payer: Medicare Other | Admitting: Student

## 2023-07-02 ENCOUNTER — Ambulatory Visit (HOSPITAL_BASED_OUTPATIENT_CLINIC_OR_DEPARTMENT_OTHER)
Admission: RE | Admit: 2023-07-02 | Discharge: 2023-07-02 | Disposition: A | Discharge status: Home / Self Care | Payer: Medicare Other | Source: Ambulatory Visit | Attending: Student | Admitting: Student

## 2023-07-02 ENCOUNTER — Other Ambulatory Visit (HOSPITAL_BASED_OUTPATIENT_CLINIC_OR_DEPARTMENT_OTHER): Payer: Self-pay

## 2023-07-02 ENCOUNTER — Encounter (HOSPITAL_BASED_OUTPATIENT_CLINIC_OR_DEPARTMENT_OTHER): Payer: Self-pay | Admitting: Student

## 2023-07-02 VITALS — BP 108/76 | HR 87 | Temp 97.9°F | Ht 68.0 in | Wt 198.1 lb

## 2023-07-02 DIAGNOSIS — E1169 Type 2 diabetes mellitus with other specified complication: Secondary | ICD-10-CM | POA: Diagnosis not present

## 2023-07-02 DIAGNOSIS — M25811 Other specified joint disorders, right shoulder: Secondary | ICD-10-CM | POA: Diagnosis not present

## 2023-07-02 DIAGNOSIS — M25511 Pain in right shoulder: Secondary | ICD-10-CM

## 2023-07-02 DIAGNOSIS — C44319 Basal cell carcinoma of skin of other parts of face: Secondary | ICD-10-CM | POA: Diagnosis not present

## 2023-07-02 DIAGNOSIS — Z7985 Long-term (current) use of injectable non-insulin antidiabetic drugs: Secondary | ICD-10-CM

## 2023-07-02 DIAGNOSIS — I255 Ischemic cardiomyopathy: Secondary | ICD-10-CM | POA: Diagnosis not present

## 2023-07-02 DIAGNOSIS — Z13220 Encounter for screening for lipoid disorders: Secondary | ICD-10-CM

## 2023-07-02 DIAGNOSIS — F332 Major depressive disorder, recurrent severe without psychotic features: Secondary | ICD-10-CM

## 2023-07-02 DIAGNOSIS — Z716 Tobacco abuse counseling: Secondary | ICD-10-CM | POA: Insufficient documentation

## 2023-07-02 DIAGNOSIS — F17200 Nicotine dependence, unspecified, uncomplicated: Secondary | ICD-10-CM

## 2023-07-02 MED ORDER — BUPROPION HCL ER (SR) 100 MG PO TB12
100.0000 mg | ORAL_TABLET | Freq: Two times a day (BID) | ORAL | 4 refills | Status: DC
  Filled 2023-07-02: qty 60, 30d supply, fill #0

## 2023-07-02 NOTE — Assessment & Plan Note (Signed)
Ordered bupropion for smoking cessation and treatment of recurrent MDD.

## 2023-07-02 NOTE — Assessment & Plan Note (Signed)
Order right shoulder x-ray. Conservative treatment while awaiting x-ray.

## 2023-07-02 NOTE — Assessment & Plan Note (Signed)
Stable-continue current regimen.   Continue to follow with cardiology.    Appreciate the continued help from cardiology.

## 2023-07-02 NOTE — Assessment & Plan Note (Signed)
Recent removal by dermatology via Mohs surgery. Patient is still in significant pain today, derm provided Oxy IR.  Patient is experiencing significant nausea associated and has begun intermittent Tylenol use rather than oxycodone.  Patient notes that Zofran has helped some.

## 2023-07-02 NOTE — Patient Instructions (Signed)
It was nice to see you today!  As we discussed in clinic I will send in wellbutrin and try to stop smoking about week 5-6.  Cortisone cream for irritated skin from bandage.   If you have any problems before your next visit feel free to message me via MyChart (minor issues or questions) or call the office, otherwise you may reach out to schedule an office visit.  Thank you! Gerilyn Pilgrim Seng Larch, PA-C

## 2023-07-02 NOTE — Assessment & Plan Note (Signed)
Order bupropion. Discuss SE. Discussed use.

## 2023-07-02 NOTE — Progress Notes (Signed)
Established Patient Office Visit  Subjective   Patient ID: Shelly [name], female    DOB: [birth year]  Age: 53 y.o. MRN: [id number]  Chief Complaint  Patient presents with   Medical Management of Chronic Issues    Follow up. Had basal cell melanoma removed from left upper forehead.     HPI  Recent removal of BCC L Upper Forehead- Recently sent in Zofran for patient after surgery as her short term immediate release opioid was making her nauseated. Zofran is helping with nausea. Swelling has gone down. Is taking only tylenol.  Patient is currently "waiting it out "  Diabetes - no hypoglycemic events. No wounds or sores that are not healing well. Checking glucose at home. Taking medications as prescribed without any side effects noted. Associated with diabetic retinopathy- legally blind.  CHF/Ischemic cardiomyopathy- Follows with cardiology. NYHA II-III. No current CP, shortness of breath, or LE edema.  Things are going well.  R shoulder Pain- still a problem.  But she would not like to try any other treatment regimens for her forehead is hurting. Get repeat xray sent in since I still have no access to records.  Smoking cessation/depression- would like to start wellbutrin due to recurrent depression.  Patient is reluctant to take Chantix due to side effect of abnormal dreams.  Husband currently smokes 2, but states that he would like to be treated for this soon as well.  Being worked up at [facility] for vaginal pain and dyspareunia.   Patient Active Problem List   Diagnosis Date Noted   Encounter for smoking cessation counseling 07/02/2023   Acute cystitis with hematuria 06/05/2023   Diabetes mellitus (HCC) 05/21/2023   Central retinal vein occlusion 05/21/2023   Pedal edema 05/21/2023   Shoulder impingement, right 05/21/2023   Basal cell carcinoma (BCC) of left forehead 05/21/2023   ICD (implantable cardioverter-defibrillator) in place 03/27/2022   Ischemic cardiomyopathy 03/27/2022    Obesity 10/14/2019   Pericardial effusion 10/14/2019   Ascites 10/14/2019   CHF (congestive heart failure) (HCC) 10/14/2019   Acute heart failure (HCC) 10/13/2019   Chest pain    Hyperbilirubinemia    Current smoker    Past Surgical History:  Procedure Laterality Date   basal cell melanoma removed from forehead Left    06/25/2023   EYE SURGERY Left 08/23/2020   PPV - Dr. [name]   ICD IMPLANT N/A 11/21/2021   Procedure: ICD IMPLANT;  Surgeon: Marinus Maw, MD;  Location: [facility] INVASIVE CV LAB;  Service: Cardiovascular;  Laterality: N/A;   INJECTION OF SILICONE OIL Left 08/23/2020   Procedure: INJECTION OF SILICONE OIL;  Surgeon: [name], MD;  Location[facility] OR;  Service: Ophthalmology;  Laterality: Left;   INJECTION OF SILICONE OIL Right 10/04/2020   Procedure: INJECTION OF SILICONE OIL;  Surgeon: [name], MD;  Location: [facility] OR;  Service: Ophthalmology;  Laterality: Right;   MEMBRANE PEEL Left 08/23/2020   Procedure: MEMBRANE PEEL;  Surgeon: [name], MD;  Location: [facility] OR;  Service: Ophthalmology;  Laterality: Left;   MEMBRANE PEEL Right 10/04/2020   Procedure: MEMBRANE PEEL;  Surgeon: [name], MD;  Location: [facility] OR;  Service: Ophthalmology;  Laterality: Right;   PARS PLANA VITRECTOMY Left 08/23/2020   Procedure: PARS PLANA VITRECTOMY WITH 25 GAUGE WITH ENDOLASER;  Surgeon: [name], MD;  Location: [facility] OR;  Service: Ophthalmology;  Laterality: Left;   PARS PLANA VITRECTOMY Right 10/04/2020   Procedure: PARS PLANA VITRECTOMY WITH 25 GAUGE, INJECTION OF AVASTIN;  Surgeon: [name], MD;  Location: [facility] OR;  Service: Ophthalmology;  Laterality: Right;   PHOTOCOAGULATION WITH LASER Right 10/04/2020   Procedure: PHOTOCOAGULATION WITH LASER;  Surgeon: [name], MD;  Location: [facility] OR;  Service: Ophthalmology;  Laterality: Right;   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/23/2020   Procedure: REPAIR OF COMPLEX TRACTION RETINAL DETACHMENT;   Surgeon: [name], MD;  Location: [facility] OR;  Service: Ophthalmology;  Laterality: Left;   RIGHT/LEFT HEART CATH AND CORONARY ANGIOGRAPHY N/A 10/14/2019   Procedure: RIGHT/LEFT HEART CATH AND CORONARY ANGIOGRAPHY;  Surgeon: Dolores Patty, MD;  Location: [facility] INVASIVE CV LAB;  Service: Cardiovascular;  Laterality: N/A;   TUBAL LIGATION        ROS Negative unless indicated in HPI.    Objective:     BP 108/76 (BP Location: Right Arm, Patient Position: Sitting, Cuff Size: Normal)   Pulse 87   Temp 97.9 F (36.6 C) (Oral)   Ht 5\' 8"  (1.727 m)   Wt 198 lb 1.6 oz (89.9 kg)   LMP  (LMP Unknown)   SpO2 96%   BMI 30.12 kg/m  BP Readings from Last 3 Encounters:  07/02/23 108/76  06/25/23 107/62  06/18/23 122/80      Physical Exam   No results found for any visits on 07/02/23.    The 10-year ASCVD risk score (Arnett DK, et al., 2019) is: 6.2%    Assessment & Plan:   Basal cell carcinoma (BCC) of left forehead Assessment & Plan: Recent removal by dermatology via Mohs surgery. Patient is still in significant pain today, derm provided Oxy IR.  Patient is experiencing significant nausea associated and has begun intermittent Tylenol use rather than oxycodone.  Patient notes that Zofran has helped some.   Shoulder impingement, right Assessment & Plan: Order right shoulder x-ray. Conservative treatment while awaiting x-ray.  Orders: -     DG Shoulder Right; Future  Type 2 diabetes mellitus with other specified complication, without long-term current use of insulin (HCC) Assessment & Plan: Stable-continue current regimen. Assess disease state hemoglobin A1c and microalbumin creatinine urine ratio.  Orders: -     Microalbumin / creatinine urine ratio -     CBC with Differential/Platelet -     Comprehensive metabolic panel -     Hemoglobin A1c  Encounter for lipid screening for cardiovascular disease -     Lipid panel  Severe episode of recurrent major depressive  disorder, without psychotic features (HCC) -     buPROPion HCl ER (SR); Take 1 tablet (100 mg total) by mouth 2 (two) times daily.  Dispense: 60 tablet; Refill: 4  Ischemic cardiomyopathy Assessment & Plan: Stable-continue current regimen.   Continue to follow with cardiology.    Appreciate the continued help from cardiology.   Current smoker Assessment & Plan: Ordered bupropion for smoking cessation and treatment of recurrent MDD.   Encounter for smoking cessation counseling Assessment & Plan: Order bupropion. Discuss SE. Discussed use.  Orders: -     buPROPion HCl ER (SR); Take 1 tablet (100 mg total) by mouth 2 (two) times daily.  Dispense: 60 tablet; Refill: 4   I have spent greater than 40 minutes charting, educating, diagnosing and managing this patient for this visit.  Return in about 6 weeks (around 08/13/2023) for smoking cessation.    Teryl Lucy Jonuel Butterfield, PA-C

## 2023-07-02 NOTE — Assessment & Plan Note (Addendum)
Stable-continue current regimen. Assess disease state hemoglobin A1c and microalbumin creatinine urine ratio.

## 2023-07-03 ENCOUNTER — Other Ambulatory Visit (HOSPITAL_COMMUNITY): Payer: Self-pay

## 2023-07-03 LAB — CBC WITH DIFFERENTIAL/PLATELET
Basophils Absolute: 0 10*3/uL (ref 0.0–0.2)
Basos: 0 %
EOS (ABSOLUTE): 0.5 10*3/uL — ABNORMAL HIGH (ref 0.0–0.4)
Eos: 5 %
Hematocrit: 45.8 % (ref 34.0–46.6)
Hemoglobin: 15.2 g/dL (ref 11.1–15.9)
Immature Grans (Abs): 0 10*3/uL (ref 0.0–0.1)
Immature Granulocytes: 0 %
Lymphocytes Absolute: 2.7 10*3/uL (ref 0.7–3.1)
Lymphs: 26 %
MCH: 29 pg (ref 26.6–33.0)
MCHC: 33.2 g/dL (ref 31.5–35.7)
MCV: 87 fL (ref 79–97)
Monocytes Absolute: 0.6 10*3/uL (ref 0.1–0.9)
Monocytes: 6 %
Neutrophils Absolute: 6.5 10*3/uL (ref 1.4–7.0)
Neutrophils: 63 %
Platelets: 270 10*3/uL (ref 150–450)
RBC: 5.24 x10E6/uL (ref 3.77–5.28)
RDW: 11.9 % (ref 11.7–15.4)
WBC: 10.3 10*3/uL (ref 3.4–10.8)

## 2023-07-03 LAB — MICROALBUMIN / CREATININE URINE RATIO
Creatinine, Urine: 151.1 mg/dL
Microalb/Creat Ratio: 10 mg/g{creat} (ref 0–29)
Microalbumin, Urine: 15.6 ug/mL

## 2023-07-03 LAB — COMPREHENSIVE METABOLIC PANEL
ALT: 12 [IU]/L (ref 0–32)
AST: 15 [IU]/L (ref 0–40)
Albumin: 4.4 g/dL (ref 3.8–4.9)
Alkaline Phosphatase: 102 [IU]/L (ref 44–121)
BUN/Creatinine Ratio: 13 (ref 9–23)
BUN: 15 mg/dL (ref 6–24)
Bilirubin Total: 0.6 mg/dL (ref 0.0–1.2)
CO2: 24 mmol/L (ref 20–29)
Calcium: 9.9 mg/dL (ref 8.7–10.2)
Chloride: 99 mmol/L (ref 96–106)
Creatinine, Ser: 1.14 mg/dL — ABNORMAL HIGH (ref 0.57–1.00)
Globulin, Total: 2.4 g/dL (ref 1.5–4.5)
Glucose: 143 mg/dL — ABNORMAL HIGH (ref 70–99)
Potassium: 4.3 mmol/L (ref 3.5–5.2)
Sodium: 142 mmol/L (ref 134–144)
Total Protein: 6.8 g/dL (ref 6.0–8.5)
eGFR: 58 mL/min/{1.73_m2} — ABNORMAL LOW (ref >59)

## 2023-07-03 LAB — HEMOGLOBIN A1C
Est. average glucose Bld gHb Est-mCnc: 160 mg/dL
Hgb A1c MFr Bld: 7.2 % — ABNORMAL HIGH (ref 4.8–5.6)

## 2023-07-03 LAB — LIPID PANEL
Chol/HDL Ratio: 5.4 {ratio} — ABNORMAL HIGH (ref 0.0–4.4)
Cholesterol, Total: 216 mg/dL — ABNORMAL HIGH (ref 100–199)
HDL: 40 mg/dL (ref >39)
LDL Chol Calc (NIH): 147 mg/dL — ABNORMAL HIGH (ref 0–99)
Triglycerides: 159 mg/dL — ABNORMAL HIGH (ref 0–149)
VLDL Cholesterol Cal: 29 mg/dL (ref 5–40)

## 2023-07-06 ENCOUNTER — Other Ambulatory Visit (HOSPITAL_BASED_OUTPATIENT_CLINIC_OR_DEPARTMENT_OTHER): Payer: Self-pay | Admitting: Student

## 2023-07-06 ENCOUNTER — Ambulatory Visit (HOSPITAL_BASED_OUTPATIENT_CLINIC_OR_DEPARTMENT_OTHER): Payer: Medicare Other | Admitting: Student

## 2023-07-06 ENCOUNTER — Encounter (HOSPITAL_BASED_OUTPATIENT_CLINIC_OR_DEPARTMENT_OTHER): Payer: Self-pay | Admitting: Student

## 2023-07-06 ENCOUNTER — Ambulatory Visit (HOSPITAL_BASED_OUTPATIENT_CLINIC_OR_DEPARTMENT_OTHER)
Admission: RE | Admit: 2023-07-06 | Discharge: 2023-07-06 | Disposition: A | Discharge status: Home / Self Care | Payer: Medicare Other | Source: Ambulatory Visit | Attending: Student | Admitting: Radiology

## 2023-07-06 VITALS — BP 85/64 | HR 89 | Temp 97.6°F | Ht 68.0 in

## 2023-07-06 DIAGNOSIS — J069 Acute upper respiratory infection, unspecified: Secondary | ICD-10-CM | POA: Diagnosis not present

## 2023-07-06 DIAGNOSIS — H5789 Other specified disorders of eye and adnexa: Secondary | ICD-10-CM

## 2023-07-06 DIAGNOSIS — E782 Mixed hyperlipidemia: Secondary | ICD-10-CM

## 2023-07-06 LAB — POCT INFLUENZA A/B
Influenza A, POC: NEGATIVE
Influenza B, POC: NEGATIVE

## 2023-07-06 LAB — POC COVID19 BINAXNOW: SARS Coronavirus 2 Ag: NEGATIVE

## 2023-07-06 MED ORDER — IOHEXOL 300 MG/ML  SOLN
75.0000 mL | Freq: Once | INTRAMUSCULAR | Status: AC | PRN
  Administered 2023-07-06: 75 mL via INTRAVENOUS

## 2023-07-06 MED ORDER — PRAVASTATIN SODIUM 20 MG PO TABS
20.0000 mg | ORAL_TABLET | Freq: Every day | ORAL | 1 refills | Status: DC
  Filled 2023-07-06: qty 90, 90d supply, fill #0

## 2023-07-06 NOTE — Patient Instructions (Addendum)
It was nice to see you today!  As we discussed in clinic it sounds like you have a viral upper respiratory infection.   I am concerned for cellulitis extending to your eye. Though I would prefer you go to the ER, imaging will work with [place] to get you in. I will call you once I get your results. Please if your symptoms worsen and your headache continues, please go to the ER immediately.  If you have any problems before your next visit feel free to message me via MyChart (minor issues or questions) or call the office, otherwise you may reach out to schedule an office visit.  Thank you! Gerilyn Pilgrim [facility] Crutcher, PA-C

## 2023-07-06 NOTE — Progress Notes (Signed)
Acute Office Visit  Subjective:     Patient ID: Shelly [name], female    DOB: 10/29/1970, 53 y.o.   MRN: [id number]  Chief Complaint  Patient presents with   URI    Having fever, intense fever, body aches. Coughing when laying down. Headache radiates on left eye. Feels super sleepy. Was around 2 people with the flu and 1 has COVID. Woke up sweating Sunday morning. Temp yesterday was 100.6 F. Been taking tylenol.    HPI  Upper Respiratory Infection: Patient complains of symptoms of a URI. Symptoms include cough and fever as well as . Onset of symptoms was 2 days ago, gradually worsening since that time. She also c/o achiness and facial pain for the past 2 days .  She is drinking moderate amounts of fluids. Evaluation to date: none. Treatment to date: none. Discussed fluid intake. No flu shot. Got 1st two covid shots. Has been around sick contacts, two contacts had Flu and one had COVID.  L eye pain- associated with L sided headaches. Notes that this does not feel like a headache associated with congestion. Due to history of surgery on forehead and edema of L eyelid I recommended that the patient go to the emergency department immediately. Patient did not want to go to the emergency department but agreed to get a CT of her sinuses if it would be done here. Stressed ED precautions and will follow very closely.  ROS  Per HPI    Objective:    BP (!) 85/64 (BP Location: Right Arm, Patient Position: Sitting, Cuff Size: Normal)   Pulse 89   Temp 97.6 F (36.4 C) (Oral)   Ht 5\' 8"  (1.727 m)   LMP  (LMP Unknown)   SpO2 92%   BMI 30.12 kg/m    Physical Exam Constitutional:      General: She is not in acute distress.    Appearance: Normal appearance. She is ill-appearing. She is not toxic-appearing or diaphoretic.  HENT:     Right Ear: Tympanic membrane, ear canal and external ear normal.     Left Ear: Tympanic membrane, ear canal and external ear normal.     Nose: Nose normal. No  congestion or rhinorrhea.  Eyes:     Extraocular Movements: Extraocular movements intact.     Comments: No noted proptosis. Injection of L conjunctiva. Swelling of L eyelid with pain to palpation. Minor pain with movement of L eye. No erythema of lid itself.  Cardiovascular:     Rate and Rhythm: Normal rate.  Pulmonary:     Effort: Pulmonary effort is normal. No respiratory distress.     Breath sounds: No stridor.  Musculoskeletal:        General: No swelling or deformity. Normal range of motion.     Cervical back: Neck supple. No rigidity or tenderness.  Lymphadenopathy:     Cervical: Cervical adenopathy present.  Skin:    General: Skin is warm and dry.     Coloration: Skin is not jaundiced or pale.     Comments: L sided recent incision appears dry without pus or signs of infection.  Neurological:     General: No focal deficit present.     Mental Status: She is alert.  Psychiatric:        Mood and Affect: Mood normal.        Behavior: Behavior normal.     Results for orders placed or performed in visit on 07/06/23  POCT Influenza A/B  Result Value Ref Range   Influenza A, POC Negative Negative   Influenza B, POC Negative Negative  POC COVID-19 BinaxNow  Result Value Ref Range   SARS Coronavirus 2 Ag Negative Negative        Assessment & Plan:   L Eye Pain -Concern for orbital cellulitis -Strongly urged patient that she should go to the ED, patient did not want to but agreed to get imaging done in house. -Will assess imaging and send to ER as necessary. - Slight pain/discomfort upon movement of L eye with swelling of eyelid and injection of L eye concern me for orbital cellulitis. - Severe headache for several days is concerning as well- again emphasized ER.  - Discussed CT results over the phone, no signs of orbital or preseptal cellulitis. Again stressed ED precautions for headache.  Viral URI - Emphasized fluid intake - Negative POC COVID and flu, discussed that  this does not mean that it is absolutely negative. It has only been two days of symptoms at this point. - Patient is to follow up if any further advice or treatment recommendations are needed.  No follow-ups on file.  Teryl Lucy Janeal Abadi, PA-C

## 2023-07-06 NOTE — Consult Note (Signed)
Istat performed -- results 1.4 with a calculated GFR as 45

## 2023-07-07 ENCOUNTER — Other Ambulatory Visit: Payer: Self-pay

## 2023-07-07 ENCOUNTER — Encounter (HOSPITAL_BASED_OUTPATIENT_CLINIC_OR_DEPARTMENT_OTHER): Payer: Self-pay | Admitting: Student

## 2023-07-07 ENCOUNTER — Other Ambulatory Visit (HOSPITAL_COMMUNITY): Payer: Self-pay

## 2023-07-08 ENCOUNTER — Telehealth: Payer: Self-pay | Admitting: Dermatology

## 2023-07-08 NOTE — Telephone Encounter (Signed)
Patient called because she was having eye pain and wanted to know if it was due to the surgery.  I told her it could be related to the swelling and drainage in the area.  I encouraged her sleep elevated to help encourage the draining to go down.  I told her she can also use ice every hour for 15 mins where there was pain and swelling.  I also told her she could use tylenol and ibprofin for additional pain relief.

## 2023-07-09 ENCOUNTER — Other Ambulatory Visit (HOSPITAL_COMMUNITY): Payer: Self-pay

## 2023-07-09 ENCOUNTER — Other Ambulatory Visit (HOSPITAL_BASED_OUTPATIENT_CLINIC_OR_DEPARTMENT_OTHER): Payer: Self-pay

## 2023-07-09 MED ORDER — PREDNISOLONE ACETATE 1 % OP SUSP
1.0000 [drp] | Freq: Four times a day (QID) | OPHTHALMIC | 0 refills | Status: DC
  Filled 2023-07-09: qty 10, 25d supply, fill #0

## 2023-07-15 ENCOUNTER — Other Ambulatory Visit (HOSPITAL_COMMUNITY): Payer: Self-pay

## 2023-07-20 ENCOUNTER — Other Ambulatory Visit (HOSPITAL_COMMUNITY): Payer: Self-pay

## 2023-07-20 ENCOUNTER — Ambulatory Visit: Payer: Medicare Other | Admitting: Student

## 2023-07-28 ENCOUNTER — Other Ambulatory Visit (HOSPITAL_COMMUNITY): Payer: Self-pay

## 2023-07-29 ENCOUNTER — Ambulatory Visit: Payer: Medicare Other | Admitting: Student

## 2023-07-30 ENCOUNTER — Ambulatory Visit: Payer: Medicare Other | Admitting: Dermatology

## 2023-07-30 NOTE — Progress Notes (Deleted)
  Electrophysiology Office Note:   Date:  07/30/2023  ID:  Shelly [name], DOB Oct 01, 1970, MRN [id number]  Primary Cardiologist: None Primary Heart Failure: None Electrophysiologist: Lewayne Bunting, MD  {Click to update primary MD,subspecialty MD or APP then REFRESH:1}    History of Present Illness:   Shelly [name] is a 53 y.o. female with h/o HFrEF, ICM s/p ICD & Barostim seen today for routine electrophysiology followup.   Since last being seen in our clinic the patient reports doing ***.  she denies chest pain, palpitations, dyspnea, PND, orthopnea, nausea, vomiting, dizziness, syncope, edema, weight gain, or early satiety.   Review of systems complete and found to be negative unless listed in HPI.    EP Information / Studies Reviewed:    EKG is ordered today. Personal review as below.      ICD Interrogation-  reviewed in detail today,  See PACEART report.  Barostim interrogation - Performed personally and reviewed in detail today. Programming as below, see scanned report.    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  {Refresh Note OR Click here to enter BP  :1}***        Physical Exam:   VS:  LMP  (LMP Unknown)    Wt Readings from Last 3 Encounters:  07/02/23 198 lb 1.6 oz (89.9 kg)  05/21/23 195 lb 9.6 oz (88.7 kg)  04/27/23 200 lb (90.7 kg)     GEN: Well nourished, well developed in no acute distress NECK: No JVD; No carotid bruits CARDIAC: {EPRHYTHM:28826}, no murmurs, rubs, gallops RESPIRATORY:  Clear to auscultation without rales, wheezing or rhonchi  ABDOMEN: Soft, non-tender, non-distended EXTREMITIES:  No edema; No deformity   ASSESSMENT AND PLAN:    1. Chronic systolic CHF s/p [employer] ICD 2. Barostim *** NYHA *** symptoms Device  titrated from *** millamp to *** milliamp by increments of *** with good blood pressure response and no stimulation.  Device impedence stable.  Goals for titration are ***  Normal device function See scanned report.  Will follow up in *** weeks to continue titration with goal of 6-8 milliamps for chronic settings.   ***-previously considered 6 mA as her chronic setting due to stim in her neck -pt able to play with her grandson??** her prior goal   CAD  -no anginal symptoms Disposition:   Follow up with {EPPROVIDERS:28135} {EPFOLLOW YQ:65784}   Signed, Canary Brim, NP-C, AGACNP-BC [facility] - Electrophysiology  07/30/2023, 7:19 PM

## 2023-08-03 ENCOUNTER — Ambulatory Visit: Payer: Medicare Other | Admitting: Pulmonary Disease

## 2023-08-03 DIAGNOSIS — Z95810 Presence of automatic (implantable) cardiac defibrillator: Secondary | ICD-10-CM

## 2023-08-03 DIAGNOSIS — I2510 Atherosclerotic heart disease of native coronary artery without angina pectoris: Secondary | ICD-10-CM

## 2023-08-03 DIAGNOSIS — I255 Ischemic cardiomyopathy: Secondary | ICD-10-CM

## 2023-08-07 NOTE — Progress Notes (Signed)
 [facility] Note    PCP: Rothfuss, Teryl Lucy, PA-C  Cardiologist: Dr. Eldridge Dace HF: Dr. Gala Romney   HPI: [name] is a 53 y.o. woman with DM2, tobacco abuse, CAD and systolic HF diagnosed in 5/21 with EF 20%.  Admitted 5/21 with ADHF. Echo EF 20% with severe RV dysfunction. R/LHC showed diffuse non-obstructive CAD with chronic dissection/recannalization of mid LAD. Suspect she had an out of hospital MI at some point.  There were no interventional targets.  Outpatient cMRI 8/21 1.  Mild LV dilatation with severe systolic dysfunction (LVEF 30%) 2. Subendocardial LGE c/w prior LAD and RCA infarcts. >50% transmural LGE suggesting nonviability in basal to mid inferior walls, apical anterior/septal walls, and apex. <50% transmurality of LGE suggesting viability in mid anterior wall. 3.  Normal RV size and systolic function (EF 66%)  Echo 01/15/21:  EF 35-40% RV ok. BP low. Entresto decreased, lasix cut back to MWF.  Evaluated in ED 02/20/21 for LOC. Felt to be orthostatic, CT head negative.  Zio-14 day (only able to wear for 4) showed mostly SR, no high-grade arrhythmias.  Follow up 2/23 had syncopal episode 1 day prior. NYHA II, volume ok. She was referred to EP for ILR to exclude high-grade arrhythmias.   Seen in ED 08/11/21 for syncopal episode. CT negative for PE. Orthostatics + with BP dropping to 80s and symptomatic, advised admission for re-hydration but she left AMA.  Echo 03/23: EF 20-25%, RV okay  14 day zio 03/23: SR with rare PACs and occasional PVCs. No significant arrhythmias.  She underwent ICD placement by Dr. Ladona Ridgel in June 2023.  Echo 8/24 EF ~30% (read as 20-25%) + apical clot  S/p barostim 8/24.  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  Today she returns for HF follow up with her husband. Under a lot of stress with recent Mohs surgery and emergency eye surgery. 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 She  had b/l retinal detachment s/p failed surgeries. Able to see some shapes but is legally blind.  Has difficulty with "my tongue saying my words", this occurs occasionally and she is attributing it to her barostim titration. She has SOB walking up inclines. Feeling more palpitations, attributes this to recent life stressors. Denies  abnormal bleeding, CP, dizziness, edema, or PND/Orthopnea. Appetite ok. No fever or chills. Weight at home 190 pounds. Taking all medications. Was in donut hole, had to stop Trulicity and Repatha, just restated meds 06/2023. Has a 66 month old grandson, Shelly [name]. Smoking 1/2 ppd.  Cardiac Studies: Echo (8/24): EF 30%, + apical clot  Echo (8/22): EF 35-40% RV ok.  Echo 5/21: EF 20% with severe RV dysfunction.  RHC 5/21 Ao = 106/75 (87) LV = 107/27 RA = 15 RV = 54/16 PA = 57/25 (40) PCW = 27 (v = 37) Fick cardiac output/index = 4.4/2.0 PVR = 1.4 WU SVR = 1297 Ao sat = 99% PA sat = 62%, 63%  Past Medical History:  Diagnosis Date   AICD (automatic cardioverter/defibrillator) present    Anemia    Anxiety    Arthritis    Cataract    Mixed OU   CHF (congestive heart failure) (HCC)    Coronary artery disease    Detached retina, bilateral    Diabetes mellitus without complication (HCC)    type 2   Diabetic retinopathy (HCC)    PDR OU   Dizziness    Headache    Cluster headaches  in the past   History of kidney stones    "2 sitting"   Hyperlipidemia    Hypertension    Hypertensive retinopathy    OU   Myocardial infarction (HCC)    Pneumonia    Syncope    Tobacco abuse    Current Outpatient Medications  Medication Sig Dispense Refill   albuterol (VENTOLIN HFA) 108 (90 Base) MCG/ACT inhaler Inhale 2 puffs into the lungs every 4 - 6 hours as needed for wheezing. 6.7 g 0   amoxicillin-clavulanate (AUGMENTIN) 875-125 MG tablet Take 1 tablet by mouth 2 (two) times daily for 7 days. 14 tablet 0   apixaban (ELIQUIS) 5 MG TABS tablet Take 1 tablet (5 mg total)  by mouth 2 (two) times daily. 60 tablet 11   carvedilol (COREG) 3.125 MG tablet Take 1 tablet (3.125 mg total) by mouth 2 (two) times daily. 180 tablet 2   cholecalciferol (VITAMIN D) 25 MCG (1000 UNIT) tablet Take 1,000 Units by mouth in the morning.     dapagliflozin propanediol (FARXIGA) 10 MG TABS tablet Take 1 tablet (10 mg total) by mouth daily. 90 tablet 3   diclofenac Sodium (GOODSENSE ARTHRITIS PAIN) 1 % GEL Apply 2 g topically 4 (four) times daily for 7 days to single elbow, wrist or hand; for hand includes palm/fingers/back of hand. (Patient taking differently: Apply 2 g topically as needed.) 100 g 0   Dulaglutide (TRULICITY) 3 MG/0.5ML SOAJ Inject 3 mg into the skin once a week. 6 mL 0   Evolocumab (REPATHA SURECLICK) 140 MG/ML SOAJ Inject 1 Dose into the skin every 14 (fourteen) days. 6 mL 3   furosemide (LASIX) 20 MG tablet Take 1 tablet (20 mg total) by mouth 3 (three) times a week. 30 tablet 3   losartan (COZAAR) 25 MG tablet Take 1 tablet (25 mg total) by mouth at bedtime. 90 tablet 3   Melatonin 10 MG TABS Take 10 mg by mouth at bedtime as needed (sleep.).     nicotine (NICODERM CQ - DOSED IN MG/24 HR) 7 mg/24hr patch Place 1 patch (7 mg total) onto the skin daily. 28 patch 5   ondansetron (ZOFRAN) 4 MG tablet Take 1 tablet (4 mg total) by mouth every 8 (eight) hours as needed for nausea or vomiting. 20 tablet 0   Phenylephrine-Acetaminophen (SINUS CONGESTION/PAIN PO) Take 2 tablets by mouth every 6 (six) hours as needed (sinus).     prednisoLONE acetate (PRED FORTE) 1 % ophthalmic suspension Place 1 drop into both eyes 4 (four) times daily for 5 days 10 mL 0   spironolactone (ALDACTONE) 25 MG tablet Take 1 tablet (25 mg total) by mouth daily. 90 tablet 3   No current facility-administered medications for this encounter.   Allergies  Allergen Reactions   Crestor [Rosuvastatin] Diarrhea   Lipitor [Atorvastatin] Diarrhea    Social History   Socioeconomic History   Marital  status: Married    Spouse name: Not on file   Number of children: Not on file   Years of education: Not on file   Highest education level: Not on file  Occupational History   Not on file  Tobacco Use   Smoking status: Every Day    Current packs/day: 0.50    Average packs/day: 0.5 packs/day for 37.2 years (18.6 ttl pk-yrs)    Types: Cigarettes    Start date: 1988    Passive exposure: Current   Smokeless tobacco: Never  Vaping Use   Vaping status: Never Used  Substance and Sexual Activity   Alcohol use: Not Currently   Drug use: Never   Sexual activity: Not on file  Other Topics Concern   Not on file  Social History Narrative   Not on file   Social Drivers of Health   Financial Resource Strain: Not on file  Food Insecurity: No Food Insecurity (07/02/2023)   Hunger Vital Sign    Worried About Running Out of Food in the Last Year: Never true    Ran Out of Food in the Last Year: Never true  Transportation Needs: No Transportation Needs (07/02/2023)   PRAPARE - Administrator, Civil Service (Medical): No    Lack of Transportation (Non-Medical): No  Physical Activity: Not on file  Stress: Not on file  Social Connections: Not on file  Intimate Partner Violence: Not At Risk (08/13/2023)   Humiliation, Afraid, Rape, and Kick questionnaire    Fear of Current or Ex-Partner: No    Emotionally Abused: No    Physically Abused: No    Sexually Abused: No   Family History  Problem Relation Age of Onset   Sudden Cardiac Death Mother    CAD Father    CAD Paternal Grandmother    CAD Paternal Grandfather     Wt Readings from Last 3 Encounters:  08/17/23 86.2 kg (190 lb)  08/13/23 86 kg (189 lb 9.6 oz)  07/02/23 89.9 kg (198 lb 1.6 oz)   BP 118/80   Pulse 93   Ht 5\' 8"  (1.727 m)   Wt 86.2 kg (190 lb)   LMP  (LMP Unknown)   SpO2 99%   BMI 28.89 kg/m   PHYSICAL EXAM: General:  NAD. No resp difficulty, arrived in [facility] HEENT: + legally blind Neck: Supple. No  JVD. Cor: Regular rate & rhythm. No rubs, gallops or murmurs. Lungs: Clear Abdomen: Soft, nontender, nondistended.  Extremities: No cyanosis, clubbing, rash, edema Neuro: Alert & oriented x 3, moves all 4 extremities w/o difficulty. Affect pleasant.  ICD interrogation (personally reviewed from 08/13/23): HL score 21, thoracic imedence ok, average HR 74   ASSESSMENT & PLAN: 1. Syncope, recurrent - Suspect autonomic dysfunction in setting of advanced DM2.  - Zio 4 day (10/22): showed mostly SR, no high grade arrhythmia - Now with ICD (placed 6/23)  - ICD followed by Dr. Ladona Ridgel. - No recent event  2. 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 R/LHC showed diffuse non-obstructive CAD with chronic dissection/recannalization of mid LAD. Suspect she had an out of hospital MI at some point.  There were no interventional targets. - cMRI 8/21: LVEF 30% prior LAD & RCA infarcts. RV ok  - Echo 01/15/21:  EF 35-40% RV ok  - Echo 3/23 EF 20-25% RV ok  - S/p ICD 06/23 - Echo 8/24 EF ~ 30% + apical clot (read as EF 20-25%) - Stable NYHA II-III symptoms, volume ok., HL score up but impedence looks ok - Continue Lasix 20 mg 3x/week - Continue losartan 25 mg qhs. (Failed Entresto due to low BP) - Continue spiro 25 mg daily. - Continue Coreg 3.125 mg bid. - Continue Farxiga 10 mg daily - Labs today    3. CAD - LHC 5/21 showed diffuse non-obstructive CAD with chronic dissection/recannalization of mid LAD. Suspect she had an out of hospital MI at some point.  There were no interventional targets. - cMRI as above c/w prior infarcts in LAD/RCA territories - No  s/s angina - Continue ASA, Farxiga & Trulicity - Off Crestor and atorva with GI upset - Followed by Lipid Clinic - on Repatha - Still smoking. Needs complete smoking cessation  4. LV apical clot - Continue Eliquis 5 mg bid. No bleeding issues - CBC and iron studies today.  5. Type 2 DM with  diabetic retinopathy  - Management per PCP. - Continue Farxiga & Trulicity.  6. Tobacco Abuse - Still smoking 1/2 ppd. - Discussed smoking cessation again today, she is trying to quit   7. HLD  - Failed statins. - LDL 147 on labs 07/02/23, but was off Repatha at that time, now back on as of 06/2023 - Follows with Lipid Clinic  8. Snoring/fatigue - No OSA on PSG, AHI 1.2/hr - Check TSH, iron panel, CBC and BMET today  She was given a note today for her employer stating she should be allowed to work from home as much as possible due to symptoms.  Follow up in 6 months with Dr. Mickle Plumb, FNP 08/17/23

## 2023-08-11 ENCOUNTER — Other Ambulatory Visit (HOSPITAL_COMMUNITY): Payer: Self-pay

## 2023-08-13 ENCOUNTER — Encounter (HOSPITAL_BASED_OUTPATIENT_CLINIC_OR_DEPARTMENT_OTHER): Payer: Self-pay | Admitting: Student

## 2023-08-13 ENCOUNTER — Other Ambulatory Visit: Payer: Self-pay

## 2023-08-13 ENCOUNTER — Other Ambulatory Visit (HOSPITAL_COMMUNITY): Payer: Self-pay

## 2023-08-13 ENCOUNTER — Other Ambulatory Visit (HOSPITAL_BASED_OUTPATIENT_CLINIC_OR_DEPARTMENT_OTHER): Payer: Self-pay

## 2023-08-13 ENCOUNTER — Ambulatory Visit (INDEPENDENT_AMBULATORY_CARE_PROVIDER_SITE_OTHER): Payer: Medicare Other | Admitting: Student

## 2023-08-13 VITALS — BP 117/77 | HR 84 | Temp 97.5°F | Ht 68.0 in | Wt 189.6 lb

## 2023-08-13 DIAGNOSIS — F17200 Nicotine dependence, unspecified, uncomplicated: Secondary | ICD-10-CM

## 2023-08-13 DIAGNOSIS — Z1231 Encounter for screening mammogram for malignant neoplasm of breast: Secondary | ICD-10-CM | POA: Diagnosis not present

## 2023-08-13 DIAGNOSIS — J0110 Acute frontal sinusitis, unspecified: Secondary | ICD-10-CM | POA: Insufficient documentation

## 2023-08-13 DIAGNOSIS — Z716 Tobacco abuse counseling: Secondary | ICD-10-CM | POA: Diagnosis not present

## 2023-08-13 MED ORDER — NICOTINE POLACRILEX 2 MG MT LOZG
2.0000 mg | LOZENGE | OROMUCOSAL | 0 refills | Status: DC | PRN
  Filled 2023-08-13: qty 100, fill #0

## 2023-08-13 MED ORDER — NICOTINE 7 MG/24HR TD PT24
7.0000 mg | MEDICATED_PATCH | Freq: Every day | TRANSDERMAL | 5 refills | Status: DC
  Filled 2023-08-13: qty 28, 28d supply, fill #0

## 2023-08-13 MED ORDER — NICOTINE 7 MG/24HR TD PT24
7.0000 mg | MEDICATED_PATCH | Freq: Every day | TRANSDERMAL | 5 refills | Status: DC
Start: 2023-08-13 — End: 2024-04-07
  Filled 2023-08-13: qty 28, 28d supply, fill #0
  Filled 2023-09-22: qty 28, 28d supply, fill #1

## 2023-08-13 MED ORDER — NICOTINE POLACRILEX 2 MG MT LOZG
2.0000 mg | LOZENGE | OROMUCOSAL | 0 refills | Status: DC | PRN
  Filled 2023-08-13: qty 72, 10d supply, fill #0

## 2023-08-13 MED ORDER — AMOXICILLIN-POT CLAVULANATE 875-125 MG PO TABS
1.0000 | ORAL_TABLET | Freq: Two times a day (BID) | ORAL | 0 refills | Status: AC
  Filled 2023-08-13: qty 14, 7d supply, fill #0

## 2023-08-13 NOTE — Assessment & Plan Note (Signed)
 Order patches and lozenges-discussed use. Patient desired lowest dose patches due to nausea associated with patches in the past.

## 2023-08-13 NOTE — Progress Notes (Signed)
 Established Patient Office Visit  Subjective   Patient ID: Shelly [name], female    DOB: Apr 29, 1971  Age: 53 y.o. MRN: [id number]  Chief Complaint  Patient presents with   Nicotine Dependence    Would like to discuss smoking cessation.   Handicap Placard    Would like handicap placard renewal.   Sinus Problem    Been fighting sinus infection. Pressure on face, nose, and jaw for 2 weeks now.    Nicotine Dependence Her urge triggers include company of smokers.  Sinus Problem  Sinus problem- maxillary and frontal sinus issues. 2 weeks without improvement. No fever. R ear feels "a little wonky". Denies otalgia.  Denies allergy to penicillins-patient is in agreement with Augmentin for infection and follow-up as needed.  Nicotine dependence- Came off of wellbutrin. Eye doctor stated that it could be medicine or surgery that caused the acute angle-closure glaucoma. History of nausea on patch at higher doses. Was smoking about 1ppd- now smoking 0.5ppd.   History of acute angle-closure glaucoma-ophthalmology was unsure of the cause of the acute angle-closure glaucoma, patient had laser procedure done.  Patient states that the pain is gone at this point.  No other ocular issues currently.  Handicap- Patient would like a handicap placard renewal.  Handicap paperwork filled out.    ROS Negative unless indicated in HPI.    Objective:     BP 117/77 (BP Location: Right Arm, Patient Position: Sitting, Cuff Size: Normal)   Pulse 84   Temp (!) 97.5 F (36.4 C) (Oral)   Ht 5\' 8"  (1.727 m)   Wt 189 lb 9.6 oz (86 kg)   LMP  (LMP Unknown)   SpO2 95%   BMI 28.83 kg/m    Physical Exam Constitutional:      Appearance: Normal appearance.  HENT:     Head: Normocephalic and atraumatic.     Comments: Frontal and maxillary pain to palaption.    Right Ear: External ear normal.     Left Ear: Tympanic membrane, ear canal and external ear normal.     Ears:     Comments: Right TM-appears to  have effusion behind the tympanic membrane.    Nose: Nose normal.  Eyes:     General: No scleral icterus.    Conjunctiva/sclera: Conjunctivae normal.  Cardiovascular:     Rate and Rhythm: Normal rate and regular rhythm.     Heart sounds: Normal heart sounds. No murmur heard.    No gallop.  Pulmonary:     Effort: Pulmonary effort is normal. No respiratory distress.     Breath sounds: Normal breath sounds. No wheezing, rhonchi or rales.  Skin:    General: Skin is warm and dry.  Neurological:     General: No focal deficit present.     Mental Status: She is alert.  Psychiatric:        Mood and Affect: Mood normal.        Behavior: Behavior normal.     No results found for any visits on 08/13/23.    The 10-year ASCVD risk score (Arnett DK, et al., 2019) is: 13.6%    Assessment & Plan:   Acute non-recurrent frontal sinusitis Assessment & Plan: 2 weeks without improvement- physical exam revealed pain to palpation over frontal and maxillary sinuses.  Also noted to have effusion behind right TM. Order Augmentin twice daily for 7 days. Patient has to follow-up as needed.  Orders: -     Amoxicillin-Pot Clavulanate; Take 1 tablet  by mouth 2 (two) times daily for 7 days.  Dispense: 14 tablet; Refill: 0  Current smoker Assessment & Plan: Order patches and lozenges-discussed use. Patient desired lowest dose patches due to nausea associated with patches in the past.  Orders: -     Nicotine; Place 1 patch (7 mg total) onto the skin daily.  Dispense: 28 patch; Refill: 5 -     Nicotine Polacrilex; Take 1 lozenge (2 mg total) by mouth as needed for smoking cessation.  Dispense: 72 tablet; Refill: 0  Encounter for smoking cessation counseling Assessment & Plan: Order patches and lozenges-discussed use. Patient desired lowest dose patches due to nausea associated with patches in the past.  Orders: -     Nicotine; Place 1 patch (7 mg total) onto the skin daily.  Dispense: 28 patch;  Refill: 5 -     Nicotine Polacrilex; Take 1 lozenge (2 mg total) by mouth as needed for smoking cessation.  Dispense: 72 tablet; Refill: 0  Screening mammogram for breast cancer -     3D Screening Mammogram, Left and Right; Future   I have spent greater than 30 minutes charting, educating, diagnosing and managing this patient for this visit.  Return in about 7 weeks (around 10/01/2023) for nicotine dependence/DM/foot exam.    Teryl Lucy Rajvir Ernster, PA-C

## 2023-08-13 NOTE — Patient Instructions (Signed)
 It was nice to see you today!  As we discussed in clinic please let me know if you have any issues with the patches or lozenges or if you need refills prior to your next visit.  If you have any problems before your next visit feel free to message me via MyChart (minor issues or questions) or call the office, otherwise you may reach out to schedule an office visit.  Thank you! Gerilyn Pilgrim Coral Timme, PA-C

## 2023-08-13 NOTE — Assessment & Plan Note (Addendum)
 2 weeks without improvement- physical exam revealed pain to palpation over frontal and maxillary sinuses.  Also noted to have effusion behind right TM. Order Augmentin twice daily for 7 days. Patient has to follow-up as needed.

## 2023-08-14 ENCOUNTER — Other Ambulatory Visit (HOSPITAL_COMMUNITY): Payer: Self-pay

## 2023-08-17 ENCOUNTER — Encounter: Payer: Medicare Other | Admitting: Student

## 2023-08-17 ENCOUNTER — Ambulatory Visit (HOSPITAL_COMMUNITY)
Admission: RE | Admit: 2023-08-17 | Discharge: 2023-08-17 | Disposition: A | Discharge status: Home / Self Care | Payer: Medicare Other | Source: Ambulatory Visit | Attending: Family Medicine

## 2023-08-17 ENCOUNTER — Encounter (HOSPITAL_COMMUNITY): Payer: Self-pay

## 2023-08-17 VITALS — BP 118/80 | HR 93 | Ht 68.0 in | Wt 190.0 lb

## 2023-08-17 DIAGNOSIS — Z7985 Long-term (current) use of injectable non-insulin antidiabetic drugs: Secondary | ICD-10-CM | POA: Insufficient documentation

## 2023-08-17 DIAGNOSIS — Z7901 Long term (current) use of anticoagulants: Secondary | ICD-10-CM | POA: Diagnosis not present

## 2023-08-17 DIAGNOSIS — R0683 Snoring: Secondary | ICD-10-CM

## 2023-08-17 DIAGNOSIS — Z7984 Long term (current) use of oral hypoglycemic drugs: Secondary | ICD-10-CM | POA: Diagnosis not present

## 2023-08-17 DIAGNOSIS — E1139 Type 2 diabetes mellitus with other diabetic ophthalmic complication: Secondary | ICD-10-CM | POA: Diagnosis not present

## 2023-08-17 DIAGNOSIS — I513 Intracardiac thrombosis, not elsewhere classified: Secondary | ICD-10-CM | POA: Diagnosis not present

## 2023-08-17 DIAGNOSIS — I110 Hypertensive heart disease with heart failure: Secondary | ICD-10-CM | POA: Diagnosis not present

## 2023-08-17 DIAGNOSIS — H4020X0 Unspecified primary angle-closure glaucoma, stage unspecified: Secondary | ICD-10-CM | POA: Insufficient documentation

## 2023-08-17 DIAGNOSIS — Z95810 Presence of automatic (implantable) cardiac defibrillator: Secondary | ICD-10-CM | POA: Diagnosis not present

## 2023-08-17 DIAGNOSIS — Z79899 Other long term (current) drug therapy: Secondary | ICD-10-CM | POA: Diagnosis not present

## 2023-08-17 DIAGNOSIS — I5022 Chronic systolic (congestive) heart failure: Secondary | ICD-10-CM | POA: Diagnosis present

## 2023-08-17 DIAGNOSIS — H548 Legal blindness, as defined in USA: Secondary | ICD-10-CM | POA: Diagnosis not present

## 2023-08-17 DIAGNOSIS — E11319 Type 2 diabetes mellitus with unspecified diabetic retinopathy without macular edema: Secondary | ICD-10-CM | POA: Diagnosis not present

## 2023-08-17 DIAGNOSIS — E785 Hyperlipidemia, unspecified: Secondary | ICD-10-CM | POA: Diagnosis not present

## 2023-08-17 DIAGNOSIS — Z7982 Long term (current) use of aspirin: Secondary | ICD-10-CM | POA: Diagnosis not present

## 2023-08-17 DIAGNOSIS — F17210 Nicotine dependence, cigarettes, uncomplicated: Secondary | ICD-10-CM | POA: Insufficient documentation

## 2023-08-17 DIAGNOSIS — I2510 Atherosclerotic heart disease of native coronary artery without angina pectoris: Secondary | ICD-10-CM

## 2023-08-17 DIAGNOSIS — R55 Syncope and collapse: Secondary | ICD-10-CM | POA: Insufficient documentation

## 2023-08-17 DIAGNOSIS — Z794 Long term (current) use of insulin: Secondary | ICD-10-CM

## 2023-08-17 DIAGNOSIS — Z720 Tobacco use: Secondary | ICD-10-CM

## 2023-08-17 LAB — CBC
HCT: 44.4 % (ref 36.0–46.0)
Hemoglobin: 14.7 g/dL (ref 12.0–15.0)
MCH: 28.6 pg (ref 26.0–34.0)
MCHC: 33.1 g/dL (ref 30.0–36.0)
MCV: 86.4 fL (ref 80.0–100.0)
Platelets: 306 10*3/uL (ref 150–400)
RBC: 5.14 MIL/uL — ABNORMAL HIGH (ref 3.87–5.11)
RDW: 12.2 % (ref 11.5–15.5)
WBC: 12.1 10*3/uL — ABNORMAL HIGH (ref 4.0–10.5)
nRBC: 0 % (ref 0.0–0.2)

## 2023-08-17 LAB — IRON AND TIBC
Iron: 54 ug/dL (ref 28–170)
Saturation Ratios: 14 % (ref 10.4–31.8)
TIBC: 374 ug/dL (ref 250–450)
UIBC: 320 ug/dL

## 2023-08-17 LAB — BASIC METABOLIC PANEL
Anion gap: 9 (ref 5–15)
BUN: 9 mg/dL (ref 6–20)
CO2: 30 mmol/L (ref 22–32)
Calcium: 9.8 mg/dL (ref 8.9–10.3)
Chloride: 99 mmol/L (ref 98–111)
Creatinine, Ser: 1.16 mg/dL — ABNORMAL HIGH (ref 0.44–1.00)
GFR, Estimated: 56 mL/min — ABNORMAL LOW (ref >60)
Glucose, Bld: 143 mg/dL — ABNORMAL HIGH (ref 70–99)
Potassium: 3.2 mmol/L — ABNORMAL LOW (ref 3.5–5.1)
Sodium: 138 mmol/L (ref 135–145)

## 2023-08-17 LAB — FERRITIN: Ferritin: 37 ng/mL (ref 11–307)

## 2023-08-17 LAB — TSH: TSH: 0.694 u[IU]/mL (ref 0.350–4.500)

## 2023-08-17 LAB — BRAIN NATRIURETIC PEPTIDE: B Natriuretic Peptide: 94.8 pg/mL (ref 0.0–100.0)

## 2023-08-17 NOTE — Patient Instructions (Signed)
 Medication Changes:  No Changes In Medications at this time.   Lab Work:  Labs done today, your results will be available in MyChart, we will contact you for abnormal readings.  Follow-Up in: 6 MONTHS PLEASE CALL OUR OFFICE AROUND JULY TO GET SCHEDULED FOR YOUR APPOINTMENT. PHONE NUMBER IS [phone] OPTION 2   At the [facility], you and your health needs are our priority. We have a designated team specialized in the treatment of Heart Failure. This Care Team includes your primary Heart Failure Specialized Cardiologist (physician), Advanced Practice Providers (APPs- Physician Assistants and Nurse Practitioners), and Pharmacist who all work together to provide you with the care you need, when you need it.   You may see any of the following providers on your designated Care Team at your next follow up:  Dr. Arvilla Meres Dr. Marca Ancona Dr. Dorthula Nettles Dr. Theresia Bough Tonye Becket, NP Robbie Lis, [place] [facility] [place], [place] Brynda Peon, NP [place] Lee, NP Karle Plumber, PharmD   Please be sure to bring in all your medications bottles to every appointment.   Need to Contact us:  If you have any questions or concerns before your next appointment please send [place] a message through [place] or call our office at [phone].    TO LEAVE A MESSAGE FOR THE NURSE SELECT OPTION 2, PLEASE LEAVE A MESSAGE INCLUDING: YOUR NAME DATE OF BIRTH CALL BACK NUMBER REASON FOR CALL**this is important as we prioritize the call backs  YOU WILL RECEIVE A CALL BACK THE SAME DAY AS LONG AS YOU CALL BEFORE 4:00 PM

## 2023-08-18 ENCOUNTER — Other Ambulatory Visit (HOSPITAL_COMMUNITY): Payer: Self-pay

## 2023-08-18 ENCOUNTER — Telehealth (HOSPITAL_COMMUNITY): Payer: Self-pay | Admitting: Cardiology

## 2023-08-18 ENCOUNTER — Other Ambulatory Visit: Payer: Self-pay

## 2023-08-18 DIAGNOSIS — I5022 Chronic systolic (congestive) heart failure: Secondary | ICD-10-CM

## 2023-08-18 MED ORDER — POTASSIUM CHLORIDE CRYS ER 20 MEQ PO TBCR
EXTENDED_RELEASE_TABLET | ORAL | 3 refills | Status: DC
  Filled 2023-08-18: qty 20, 33d supply, fill #0
  Filled 2023-09-22: qty 20, 49d supply, fill #1
  Filled 2023-11-06: qty 20, 49d supply, fill #2
  Filled 2023-12-25: qty 20, 46d supply, fill #3

## 2023-08-18 NOTE — Telephone Encounter (Signed)
 Patient called.  Patient aware.

## 2023-08-18 NOTE — Telephone Encounter (Signed)
-----   Message from [place] sent at 08/18/2023  8:23 AM EDT ----- WBC up a bit, watch for s/s infection  K is low, take 40 KCL daily x 3 days then change to 20 KCL on days she takes Lasix which is 3x/week. Repeat BMET in 7 days  Tsat and iron are are the low end. Iron deficiency may be contributing to symptoms. Please arrange iron infusion if she is agreeable.

## 2023-08-21 ENCOUNTER — Ambulatory Visit (INDEPENDENT_AMBULATORY_CARE_PROVIDER_SITE_OTHER): Payer: Commercial Managed Care - HMO

## 2023-08-21 DIAGNOSIS — I255 Ischemic cardiomyopathy: Secondary | ICD-10-CM

## 2023-08-21 LAB — CUP PACEART REMOTE DEVICE CHECK
Battery Remaining Longevity: 168 mo
Battery Remaining Percentage: 100 %
Brady Statistic RV Percent Paced: 0 %
Date Time Interrogation Session: 20250314002000
HighPow Impedance: 85 Ohm
Implantable Lead Connection Status: 753985
Implantable Lead Implant Date: 20230615
Implantable Lead Location: 753860
Implantable Lead Model: 137
Implantable Lead Serial Number: 301168
Implantable Pulse Generator Implant Date: 20230615
Lead Channel Impedance Value: 526 Ohm
Lead Channel Pacing Threshold Amplitude: 0.8 V
Lead Channel Pacing Threshold Pulse Width: 0.4 ms
Lead Channel Setting Pacing Amplitude: 2.5 V
Lead Channel Setting Pacing Pulse Width: 0.4 ms
Lead Channel Setting Sensing Sensitivity: 0.5 mV
Pulse Gen Serial Number: 217479
Zone Setting Status: 755011

## 2023-08-24 ENCOUNTER — Encounter: Payer: Self-pay | Admitting: Internal Medicine

## 2023-08-25 ENCOUNTER — Other Ambulatory Visit (HOSPITAL_COMMUNITY): Payer: Self-pay

## 2023-08-25 ENCOUNTER — Other Ambulatory Visit (HOSPITAL_COMMUNITY): Payer: Self-pay | Admitting: Family Medicine

## 2023-08-25 DIAGNOSIS — I5022 Chronic systolic (congestive) heart failure: Secondary | ICD-10-CM

## 2023-08-26 ENCOUNTER — Other Ambulatory Visit (HOSPITAL_BASED_OUTPATIENT_CLINIC_OR_DEPARTMENT_OTHER): Payer: Self-pay

## 2023-08-26 ENCOUNTER — Other Ambulatory Visit (HOSPITAL_COMMUNITY)

## 2023-08-26 MED ORDER — DORZOLAMIDE HCL-TIMOLOL MAL 2-0.5 % OP SOLN
1.0000 [drp] | Freq: Two times a day (BID) | OPHTHALMIC | 6 refills | Status: DC
Start: 2023-08-26 — End: 2024-03-03
  Filled 2023-08-26: qty 10, 100d supply, fill #0
  Filled 2023-12-03: qty 10, 100d supply, fill #1

## 2023-08-26 MED ORDER — DORZOLAMIDE HCL-TIMOLOL MAL 2-0.5 % OP SOLN
1.0000 [drp] | Freq: Two times a day (BID) | OPHTHALMIC | 11 refills | Status: DC
Start: 2023-08-26 — End: 2024-03-03
  Filled 2023-08-26: qty 10, 50d supply, fill #0

## 2023-08-26 MED ORDER — BRIMONIDINE TARTRATE 0.2 % OP SOLN
1.0000 [drp] | Freq: Two times a day (BID) | OPHTHALMIC | 5 refills | Status: DC
Start: 2023-08-26 — End: 2024-03-03
  Filled 2023-08-26: qty 5, 50d supply, fill #0

## 2023-08-26 MED ORDER — BRIMONIDINE TARTRATE 0.2 % OP SOLN
1.0000 [drp] | Freq: Three times a day (TID) | OPHTHALMIC | 12 refills | Status: DC
  Filled 2023-08-26: qty 5, 17d supply, fill #0

## 2023-08-26 MED ORDER — ACETAZOLAMIDE ER 500 MG PO CP12
500.0000 mg | ORAL_CAPSULE | Freq: Two times a day (BID) | ORAL | 1 refills | Status: DC
  Filled 2023-08-26: qty 60, 30d supply, fill #0

## 2023-08-27 ENCOUNTER — Encounter (HOSPITAL_COMMUNITY)
Admission: RE | Admit: 2023-08-27 | Discharge: 2023-08-27 | Disposition: A | Discharge status: Home / Self Care | Source: Ambulatory Visit | Attending: Internal Medicine | Admitting: Internal Medicine

## 2023-08-27 ENCOUNTER — Ambulatory Visit (HOSPITAL_COMMUNITY)
Admission: RE | Admit: 2023-08-27 | Discharge: 2023-08-27 | Disposition: A | Discharge status: Home / Self Care | Source: Ambulatory Visit | Attending: Family Medicine | Admitting: Family Medicine

## 2023-08-27 DIAGNOSIS — I5022 Chronic systolic (congestive) heart failure: Secondary | ICD-10-CM | POA: Insufficient documentation

## 2023-08-27 LAB — BASIC METABOLIC PANEL
Anion gap: 7 (ref 5–15)
BUN: 14 mg/dL (ref 6–20)
CO2: 28 mmol/L (ref 22–32)
Calcium: 9.7 mg/dL (ref 8.9–10.3)
Chloride: 99 mmol/L (ref 98–111)
Creatinine, Ser: 1.25 mg/dL — ABNORMAL HIGH (ref 0.44–1.00)
GFR, Estimated: 52 mL/min — ABNORMAL LOW (ref >60)
Glucose, Bld: 114 mg/dL — ABNORMAL HIGH (ref 70–99)
Potassium: 4.7 mmol/L (ref 3.5–5.1)
Sodium: 134 mmol/L — ABNORMAL LOW (ref 135–145)

## 2023-08-27 MED ORDER — SODIUM CHLORIDE 0.9 % IV SOLN
510.0000 mg | Freq: Once | INTRAVENOUS | Status: AC
  Administered 2023-08-27: 510 mg via INTRAVENOUS
  Filled 2023-08-27: qty 510

## 2023-08-28 ENCOUNTER — Telehealth: Payer: Self-pay

## 2023-08-28 NOTE — Telephone Encounter (Signed)
   Patient Name: Shelly [name]  DOB: [birth year] MRN: [id number]  Primary Cardiologist: None  Chart reviewed as part of pre-operative protocol coverage. Cataract extractions are recognized in guidelines as low risk surgeries that do not typically require specific preoperative testing or holding of blood thinner therapy. Therefore, given past medical history and time since last visit, based on ACC/AHA guidelines, Shelly [name] would be at acceptable risk for the planned procedure without further cardiovascular testing.   I will route this recommendation to the requesting party via Epic fax function and remove from pre-op pool.  Please call with questions.  Joylene Grapes, NP 08/28/2023, 3:28 PM

## 2023-08-28 NOTE — Telephone Encounter (Signed)
   Pre-operative Risk Assessment    Patient Name: Shelly [name]  DOB: 1970/10/21 MRN: [id number]   Date of last office visit: 04/27/23 MICHAEL Alejandro Mulling, PA-C Date of next office visit: NONE   Request for Surgical Clearance    Procedure:   CATARACT EXTRACTION BY PE, IOL- LEFT  Date of Surgery:  Clearance TBD                                Surgeon:  DR. Koleen Distance Surgeon's Group or Practice Name:  [facility] Phone number:  [phone] Fax number:  [phone]   Type of Clearance Requested:   - Medical  - Pharmacy:  Hold Apixaban (Eliquis)     Type of Anesthesia:   IV SEDATION   Additional requests/questions:    Signed, Marlow Baars   08/28/2023, 3:19 PM

## 2023-08-30 NOTE — Progress Notes (Deleted)
  Electrophysiology Office Note:   Date:  08/30/2023  ID:  Shelly [name], DOB 08-15-1970, MRN [id number]  Primary Cardiologist: None Primary Heart Failure: None Electrophysiologist: Lewayne Bunting, MD  {Click to update primary MD,subspecialty MD or APP then REFRESH:1}    History of Present Illness:   Shelly [name] is a 53 y.o. female with h/o HFrEF, ICM s/p ICD & Barostim seen today for routine electrophysiology followup.   Since last being seen in our clinic the patient reports doing ***.  she denies chest pain, palpitations, dyspnea, PND, orthopnea, nausea, vomiting, dizziness, syncope, edema, weight gain, or early satiety.   Review of systems complete and found to be negative unless listed in HPI.    EP Information / Studies Reviewed:    EKG is ordered today. Personal review as below.      ICD Interrogation-  reviewed in detail today,  See PACEART report.  Barostim interrogation - Performed personally and reviewed in detail today. Programming as below, see scanned report.    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  {Refresh Note OR Click here to enter BP  :1}***        Physical Exam:   VS:  LMP  (LMP Unknown)    Wt Readings from Last 3 Encounters:  08/17/23 190 lb (86.2 kg)  08/13/23 189 lb 9.6 oz (86 kg)  07/02/23 198 lb 1.6 oz (89.9 kg)     GEN: Well nourished, well developed in no acute distress NECK: No JVD; No carotid bruits CARDIAC: {EPRHYTHM:28826}, no murmurs, rubs, gallops RESPIRATORY:  Clear to auscultation without rales, wheezing or rhonchi  ABDOMEN: Soft, non-tender, non-distended EXTREMITIES:  No edema; No deformity   ASSESSMENT AND PLAN:    1. Chronic systolic CHF s/p [employer] ICD 2. Barostim *** NYHA *** symptoms Device  titrated from *** millamp to *** milliamp by increments of *** with good blood pressure response and no stimulation.  Device impedence stable.  Goals for titration are ***  Normal device function See scanned report.  Will follow up in *** weeks to continue titration with goal of 6-8 milliamps for chronic settings.   ***-previously considered 6 mA as her chronic setting due to stim in her neck -pt able to play with her grandson??** her prior goal   CAD  -no anginal symptoms Disposition:   Follow up with {EPPROVIDERS:28135} {EPFOLLOW NG:29528}   Signed, Canary Brim, NP-C, AGACNP-BC [facility] - Electrophysiology  08/30/2023, 7:49 PM

## 2023-08-31 ENCOUNTER — Ambulatory Visit: Admitting: Pulmonary Disease

## 2023-08-31 ENCOUNTER — Other Ambulatory Visit (HOSPITAL_COMMUNITY): Payer: Self-pay

## 2023-08-31 ENCOUNTER — Other Ambulatory Visit (HOSPITAL_BASED_OUTPATIENT_CLINIC_OR_DEPARTMENT_OTHER): Payer: Self-pay

## 2023-08-31 ENCOUNTER — Telehealth (HOSPITAL_BASED_OUTPATIENT_CLINIC_OR_DEPARTMENT_OTHER): Payer: Self-pay | Admitting: Student

## 2023-08-31 MED ORDER — PREDNISOLONE ACETATE 1 % OP SUSP
1.0000 [drp] | Freq: Three times a day (TID) | OPHTHALMIC | 0 refills | Status: DC
  Filled 2023-08-31 (×2): qty 10, 21d supply, fill #0

## 2023-08-31 NOTE — Telephone Encounter (Signed)
 Patient dropped off document  Application for Disability License Plate , to be filled out by provider. Patient requested to send it back via Call Patient to pick up within 5-days. Please advise at [facility] [phone]

## 2023-09-02 ENCOUNTER — Other Ambulatory Visit (HOSPITAL_COMMUNITY): Payer: Self-pay | Admitting: Internal Medicine

## 2023-09-02 ENCOUNTER — Encounter: Payer: Self-pay | Admitting: Internal Medicine

## 2023-09-02 ENCOUNTER — Other Ambulatory Visit (HOSPITAL_COMMUNITY): Payer: Self-pay

## 2023-09-02 ENCOUNTER — Other Ambulatory Visit (HOSPITAL_BASED_OUTPATIENT_CLINIC_OR_DEPARTMENT_OTHER): Payer: Self-pay | Admitting: Student

## 2023-09-02 DIAGNOSIS — E113523 Type 2 diabetes mellitus with proliferative diabetic retinopathy with traction retinal detachment involving the macula, bilateral: Secondary | ICD-10-CM

## 2023-09-02 HISTORY — PX: CATARACT EXTRACTION: SUR2

## 2023-09-02 MED ORDER — TRULICITY 3 MG/0.5ML ~~LOC~~ SOAJ
3.0000 mg | SUBCUTANEOUS | 3 refills | Status: AC
Start: 2023-09-02 — End: ?
  Filled 2023-09-02: qty 6, 84d supply, fill #0
  Filled 2023-11-19: qty 6, 84d supply, fill #1
  Filled 2024-02-29: qty 6, 84d supply, fill #2
  Filled 2024-05-30: qty 6, 84d supply, fill #3

## 2023-09-03 ENCOUNTER — Other Ambulatory Visit (HOSPITAL_COMMUNITY): Payer: Self-pay

## 2023-09-03 ENCOUNTER — Other Ambulatory Visit: Payer: Self-pay

## 2023-09-04 ENCOUNTER — Other Ambulatory Visit (HOSPITAL_COMMUNITY): Payer: Self-pay

## 2023-09-04 MED ORDER — FUROSEMIDE 20 MG PO TABS
20.0000 mg | ORAL_TABLET | ORAL | 3 refills | Status: DC
  Filled 2023-09-04: qty 30, 70d supply, fill #0
  Filled 2023-11-19: qty 30, 70d supply, fill #1
  Filled 2024-02-02: qty 30, 70d supply, fill #2
  Filled 2024-04-04: qty 30, 70d supply, fill #3

## 2023-09-04 MED ORDER — SPIRONOLACTONE 25 MG PO TABS
25.0000 mg | ORAL_TABLET | Freq: Every day | ORAL | 3 refills | Status: AC
  Filled 2023-09-04 – 2023-10-23 (×3): qty 90, 90d supply, fill #0
  Filled 2024-01-19: qty 90, 90d supply, fill #1
  Filled 2024-04-04: qty 90, 90d supply, fill #2
  Filled 2024-06-28: qty 90, 90d supply, fill #3

## 2023-09-04 MED FILL — Dapagliflozin Propanediol Tab 10 MG (Base Equivalent): ORAL | 90 days supply | Qty: 90 | Fill #0 | Status: AC

## 2023-09-04 NOTE — Telephone Encounter (Signed)
 Patient checking on the status of form mentioned below.

## 2023-09-09 ENCOUNTER — Other Ambulatory Visit (HOSPITAL_BASED_OUTPATIENT_CLINIC_OR_DEPARTMENT_OTHER): Payer: Self-pay

## 2023-09-09 MED ORDER — PREDNISOLONE ACETATE 1 % OP SUSP
1.0000 [drp] | Freq: Three times a day (TID) | OPHTHALMIC | 0 refills | Status: DC
Start: 2023-09-09 — End: 2024-03-03
  Filled 2023-09-09: qty 10, 67d supply, fill #0

## 2023-09-14 ENCOUNTER — Other Ambulatory Visit (HOSPITAL_COMMUNITY): Payer: Self-pay

## 2023-09-16 HISTORY — PX: CATARACT EXTRACTION: SUR2

## 2023-09-22 ENCOUNTER — Other Ambulatory Visit (HOSPITAL_BASED_OUTPATIENT_CLINIC_OR_DEPARTMENT_OTHER): Payer: Self-pay

## 2023-09-22 ENCOUNTER — Other Ambulatory Visit (HOSPITAL_COMMUNITY): Payer: Self-pay

## 2023-09-22 MED ORDER — PREDNISOLONE ACETATE 1 % OP SUSP
1.0000 [drp] | Freq: Three times a day (TID) | OPHTHALMIC | 0 refills | Status: AC
  Filled 2023-09-22: qty 10, 14d supply, fill #0
  Filled 2023-09-22: qty 10, 67d supply, fill #0

## 2023-09-23 ENCOUNTER — Ambulatory Visit: Payer: Self-pay

## 2023-09-23 ENCOUNTER — Other Ambulatory Visit (HOSPITAL_BASED_OUTPATIENT_CLINIC_OR_DEPARTMENT_OTHER): Payer: Self-pay

## 2023-09-23 ENCOUNTER — Ambulatory Visit (HOSPITAL_BASED_OUTPATIENT_CLINIC_OR_DEPARTMENT_OTHER): Admitting: Student

## 2023-09-23 VITALS — BP 104/72 | HR 93 | Temp 98.3°F | Resp 16 | Ht 68.0 in | Wt 186.5 lb

## 2023-09-23 DIAGNOSIS — J3489 Other specified disorders of nose and nasal sinuses: Secondary | ICD-10-CM | POA: Diagnosis not present

## 2023-09-23 DIAGNOSIS — R432 Parageusia: Secondary | ICD-10-CM | POA: Diagnosis not present

## 2023-09-23 MED ORDER — FLUTICASONE PROPIONATE 50 MCG/ACT NA SUSP
2.0000 | Freq: Every day | NASAL | 6 refills | Status: DC
  Filled 2023-09-23: qty 16, 30d supply, fill #0
  Filled 2023-10-23: qty 16, 30d supply, fill #1
  Filled 2023-12-03: qty 16, 30d supply, fill #2
  Filled 2023-12-25 – 2023-12-28 (×2): qty 16, 30d supply, fill #3
  Filled 2024-02-02: qty 16, 30d supply, fill #4
  Filled 2024-02-29: qty 16, 30d supply, fill #0
  Filled 2024-04-04: qty 16, 30d supply, fill #1

## 2023-09-23 NOTE — Progress Notes (Signed)
 Acute Office Visit  Subjective:     Patient ID: GAVYN ZOSS, female    DOB: Dec 03, 1970, 53 y.o.   MRN: [id number]  Chief Complaint  Patient presents with   nasal drainage    Had left eye catarct surgery on 09/02/2022 & right eye on 09/16/2023. Was told that she should not bend over for 3 days, after that, did bend over and had episode of nasal drainage but fluid was clear. Bent over to pick up grandson and it was pouring down. Called eye doctor, but felt like it was brushed off as runny nose. Been tasting metal. Thinks it is related to prednisone. Is worried about brain infection because the sxs are related.     HPI  Discussed the use of AI scribe software for clinical note transcription with the patient, who gave verbal consent to proceed.  History of Present Illness   Shelly [name] is a 53 year old female who presents with clear nasal discharge following recent cataract surgery.  She experiences a clear, water-like nasal discharge primarily when bending over, which began shortly after her cataract surgery a week ago. The discharge is intermittent and was first noticed when she bent over to kiss her husband. There is no recent head trauma, although she did fall recently without hitting her head. Recent CT scans of the sinuses and head do not suggest a CSF leak.  She has a history of chronic headaches, which have not significantly changed, although she notes increased pain in a specific area. She also reports a persistent metallic taste in her mouth, which she attributes to her current use of prednisone. She has been on prednisone for two weeks and will continue for another two weeks. She previously experienced a similar taste when taking prednisolone for retinal issues. She was on Diamox until the 25th of the previous month, which also caused a metallic taste.  She reports some drainage from her ear, which she attributes to fluid buildup. She has a history of sinus infections and  notes that her right ear has had fluid behind it in the past. No significant visual changes or tinnitus. She has a history of ear issues from childhood, requiring treatment, though she does not recall specifics. She notes that her environment has not changed, but acknowledges that her recent surgeries might have altered her body's responses.     ROS Per HPI     Objective:    BP 104/72   Pulse 93   Temp 98.3 F (36.8 C) (Oral)   Resp 16   Ht 5\' 8"  (1.727 m)   Wt 186 lb 8 oz (84.6 kg)   LMP  (LMP Unknown)   SpO2 97%   BMI 28.36 kg/m    Physical Exam Constitutional:      General: She is not in acute distress.    Appearance: Normal appearance. She is not ill-appearing.  HENT:     Head: Normocephalic and atraumatic.     Comments: No racoon sign or other signs seen of basilar skull fx    Right Ear: Ear canal and external ear normal.     Left Ear: Ear canal and external ear normal.     Ears:     Comments: Air fluid level behind R TM and slight retraction behind L TM. Bony landmarks present, no signs of infection.    Nose: Rhinorrhea present.     Comments: Rhinorrhea appears to be consistent in color on both sides. No diffuse drainage with forward motion,  any drainage with forward motion was bilateral.    Mouth/Throat:     Mouth: Mucous membranes are moist.     Pharynx: Oropharynx is clear. No oropharyngeal exudate or posterior oropharyngeal erythema.  Eyes:     General: No scleral icterus.    Conjunctiva/sclera: Conjunctivae normal.  Cardiovascular:     Rate and Rhythm: Normal rate.  Musculoskeletal:     Cervical back: Normal range of motion. No rigidity.  Skin:    General: Skin is warm and dry.     Coloration: Skin is not jaundiced or pale.  Neurological:     General: No focal deficit present.     Mental Status: She is alert.  Psychiatric:        Mood and Affect: Mood normal.        Behavior: Behavior normal.     No results found for any visits on 09/23/23.       Assessment & Plan:   Assessment and Plan    Clear nasal discharge   Intermittent clear nasal discharge primarily when bending over, first noted post-cataract surgery. No trauma to orbit or nose. Recent CT of head and recent separate CT to sinuses showed no abnormalities. Low suspicion for cerebrospinal fluid (CSF) leak due to absence of trauma or mass. Possible causes include vasomotor rhinitis or reaction to surgical compounds or medications. Discussed that 90% of CSF leaks are trauma-related, which is absent here. CSF leaks are rare without trauma or mass, making this unlikely. Consideration of environmental factors or medication side effects as potential causes.   - Perform physical examination of ears and nose   - Initiate Flonase (fluticasone) nasal spray, two sprays in each nostril twice daily for one week, then once daily thereafter   - Monitor symptoms and reassess if drainage persists after Flonase treatment    Post-surgical care for cataract and basal cell removal   Recent cataract surgery and basal cell removal. No complications reported. Currently on prednisone with two weeks remaining. Surgeries unlikely to cause CSF leaks as they do not involve deep penetration into orbit or nasal cavity.   - Continue prednisone as prescribed    General Health Maintenance   Maxed out medical expenses for the year, indicating frequent healthcare utilization. Discussed cost-effective options for medications and treatments.   - Consider cost-effective options for medications and treatments    Follow-up   She has a trip planned in less than three weeks and is concerned about potential infections. Reassured that the likelihood of infection in the brain is low given current findings and lack of evidence for CSF leak.   - Follow up in two weeks if nasal drainage persists   - Contact provider if new symptoms develop   - Fill Flonase prescription at the pharmacy next door     Return if symptoms worsen  or fail to improve, for Chronic Followup.  Ciani Rutten T Druscilla Petsch, PA-C

## 2023-09-23 NOTE — Patient Instructions (Addendum)
 It was nice to see you today!  As we discussed in clinic:  Flonase 2 times daily for 1 week and once daily thereafter. If you have any other symptoms please do not hesitate to give me a call.  If you have any problems before your next visit feel free to message me via MyChart (minor issues or questions) or call the office, otherwise you may reach out to schedule an office visit.  Thank you! Herbert Marken, PA-C

## 2023-09-23 NOTE — Telephone Encounter (Signed)
 Copied from CRM [id number]. Topic: Clinical - Medical Advice >> Sep 23, 2023  1:01 PM Santiya F wrote: Reason for CRM: patient had cataract surgery on 09/16/23 and she says she believes she is leaking brain fluid from her left nostril. Patient says it's not mucus and she also has a metal taste in her mouth. She has reached out to the surgeon and was told it was an effect of the medication but she believes it's more serious, no other symptoms.    Chief Complaint: Pt. Had eye surgery 09/16/23 and is concerned she has spinal fluid leaking from nose. Clear fluid. No nasal congestion and has metal taste in mouth. Eye doctor has cleared her. Symptoms: Headache Frequency: 09/18/23 Pertinent Negatives: Patient denies  Disposition: [] ED /[] Urgent Care (no appt availability in office) / [x] Appointment(In office/virtual)/ []  [facility]/ [] Home Care/ [] Refused Recommended Disposition /[] [facility]/ []  Follow-up with PCP Additional Notes: Agrees with appointment.  Reason for Disposition  [1] Nasal discharge AND [2] present > 10 days  Answer Assessment - Initial Assessment Questions 1. LOCATION: "Where does it hurt?"      No pain 2. ONSET: "When did the sinus pain start?"  (e.g., hours, days)      09/16/23 3. SEVERITY: "How bad is the pain?"   (Scale 1-10; mild, moderate or severe)   - MILD (1-3): doesn't interfere with normal activities    - MODERATE (4-7): interferes with normal activities (e.g., work or school) or awakens from sleep   - SEVERE (8-10): excruciating pain and patient unable to do any normal activities        None 4. RECURRENT SYMPTOM: "Have you ever had sinus problems before?" If Yes, ask: "When was the last time?" and "What happened that time?"      No 5. NASAL CONGESTION: "Is the nose blocked?" If Yes, ask: "Can you open it or must you breathe through your mouth?"     Clear 6. NASAL DISCHARGE: "Do you have discharge from your nose?" If so ask, "What color?"      Clear 7. FEVER: "Do you have a fever?" If Yes, ask: "What is it, how was it measured, and when did it start?"      No 8. OTHER SYMPTOMS: "Do you have any other symptoms?" (e.g., sore throat, cough, earache, difficulty breathing)     Metal taste in mouth, headache 9. PREGNANCY: "Is there any chance you are pregnant?" "When was your last menstrual period?"     no  Protocols used: Sinus Pain or Congestion-A-AH

## 2023-09-23 NOTE — Progress Notes (Signed)
 Remote ICD transmission.

## 2023-09-25 NOTE — Progress Notes (Signed)
  Cardiology Office Note:   Date:  09/28/2023  ID:  Shelly [name], DOB Apr 26, 1971, MRN [id number]  EP: Manya Sells, MD  History of Present Illness:   Shelly [name] is a 53 y.o. female seen today for routine electrophysiology followup.   Underwent BAT implantation 01/30/2023  At last visit, Device left at 6.0 ma for chronic programming.   Since last being seen reports doing great. KCCQ12 much improved. She still wants to get a little stronger. But no longer has SOB with ADLs, bathing, or playing with her grandchild. Had been waiting for nicer weather to really push her activity.   Her goals for device titration remain caring for her grandson and to increase wants to be able to play and take care of him without fatigue.   Device History: Barostim (standard) implanted 01/30/2023 for Chronic systolic CHF [employer] ICD implanted 11/2021 for primary prevention.   Review of systems complete and found to be negative unless listed in HPI.    EP Information / Studies Reviewed:    EKG is not ordered today. EKG from 08/17/2023 reviewed which showed NSR at ~95 bpm   Barostim Interrogation- Performed personally and reviewed in detail today,  See scanned report  ICD interrogation - Performed personally and reviewed in detail today. See PaceArt report.   Physical Exam:   VS:  BP 100/70   Pulse 81   Ht 5\' 8"  (1.727 m)   Wt 189 lb 9.6 oz (86 kg)   LMP  (LMP Unknown)   SpO2 98%   BMI 28.83 kg/m    Wt Readings from Last 3 Encounters:  09/28/23 189 lb 9.6 oz (86 kg)  09/23/23 186 lb 8 oz (84.6 kg)  08/17/23 190 lb (86.2 kg)     GEN: Well nourished, well developed in no acute distress NECK: No JVD; No carotid bruits CARDIAC: Regular rate and rhythm, no murmurs, rubs, gallops RESPIRATORY:  Clear to auscultation without rales, wheezing or rhonchi  ABDOMEN: Soft, non-tender, non-distended EXTREMITIES:  No edema; No deformity     ASSESSMENT AND PLAN:    Chronic  systolic CHF s/p Environmental manager and Barostim implantation NYHA II symptoms.   Pt had recurrent stim with further attempts at titrating.  Device left at 6.0 ma for chronic settings. \ Device impedence stable Pt goals are to increase her energy level and to be able to play with her soon to be born grandson  Normal device function. See scanned report. Update Echo.   CAD Denies s/s ischemia  Disposition:  Will update echo. 6 month follow up for chronic barostim and ICD f/u.   Signed, Tylene Galla, PA-C

## 2023-09-28 ENCOUNTER — Encounter: Payer: Self-pay | Admitting: Student

## 2023-09-28 ENCOUNTER — Ambulatory Visit: Attending: Student | Admitting: Student

## 2023-09-28 VITALS — BP 100/70 | HR 81 | Ht 68.0 in | Wt 189.6 lb

## 2023-09-28 DIAGNOSIS — I255 Ischemic cardiomyopathy: Secondary | ICD-10-CM

## 2023-09-28 DIAGNOSIS — I5022 Chronic systolic (congestive) heart failure: Secondary | ICD-10-CM | POA: Diagnosis not present

## 2023-09-28 DIAGNOSIS — I2510 Atherosclerotic heart disease of native coronary artery without angina pectoris: Secondary | ICD-10-CM

## 2023-09-28 LAB — CUP PACEART INCLINIC DEVICE CHECK
Date Time Interrogation Session: 20250421124554
HighPow Impedance: 85 Ohm
Implantable Lead Connection Status: 753985
Implantable Lead Implant Date: 20230615
Implantable Lead Location: 753860
Implantable Lead Model: 137
Implantable Lead Serial Number: 301168
Implantable Pulse Generator Implant Date: 20230615
Lead Channel Impedance Value: 562 Ohm
Lead Channel Pacing Threshold Amplitude: 0.9 V
Lead Channel Pacing Threshold Pulse Width: 0.4 ms
Lead Channel Sensing Intrinsic Amplitude: 22 mV
Lead Channel Setting Pacing Amplitude: 2.5 V
Lead Channel Setting Pacing Pulse Width: 0.4 ms
Lead Channel Setting Sensing Sensitivity: 0.5 mV
Pulse Gen Serial Number: 217479
Zone Setting Status: 755011

## 2023-09-28 NOTE — Patient Instructions (Signed)
 Medication Instructions:  Your physician recommends that you continue on your current medications as directed. Please refer to the Current Medication list given to you today.  *If you need a refill on your cardiac medications before your next appointment, please call your pharmacy*  Lab Work: None ordered If you have labs (blood work) drawn today and your tests are completely normal, you will receive your results only by: MyChart Message (if you have MyChart) OR A paper copy in the mail If you have any lab test that is abnormal or we need to change your treatment, we will call you to review the results.  Testing/Procedures: Your physician has requested that you have an echocardiogram. Echocardiography is a painless test that uses sound waves to create images of your heart. It provides your doctor with information about the size and shape of your heart and how well your heart's chambers and valves are working. This procedure takes approximately one hour. There are no restrictions for this procedure. Please do NOT wear cologne, perfume, aftershave, or lotions (deodorant is allowed). Please arrive 15 minutes prior to your appointment time.  Please note: We ask at that you not bring children with you during ultrasound (echo/ vascular) testing. Due to room size and safety concerns, children are not allowed in the ultrasound rooms during exams. Our front office staff cannot provide observation of children in our lobby area while testing is being conducted. An adult accompanying a patient to their appointment will only be allowed in the ultrasound room at the discretion of the ultrasound technician under special circumstances. We apologize for any inconvenience.   Follow-Up: At [facility], you and your health needs are our priority.  As part of our continuing mission to provide you with exceptional heart care, our providers are all part of one team.  This team includes your primary  Cardiologist (physician) and Advanced Practice Providers or APPs (Physician Assistants and Nurse Practitioners) who all work together to provide you with the care you need, when you need it.  Your next appointment:   6 month(s)  Provider:   Bambi Lever "Jonelle Neri" Percell Boyers, PA-C      1st Floor: - Lobby - Registration  - Pharmacy  - Lab - Cafe  2nd Floor: - PV Lab - Diagnostic Testing (echo, CT, nuclear med)  3rd Floor: - Vacant  4th Floor: - TCTS (cardiothoracic surgery) - AFib Clinic - Structural Heart Clinic - Vascular Surgery  - Vascular Ultrasound  5th Floor: - [facility]Care Cardiology (general and EP) - Clinical Pharmacy for coumadin, hypertension, lipid, weight-loss medications, and med management appointments    Valet parking services will be available as well.

## 2023-10-05 ENCOUNTER — Ambulatory Visit (HOSPITAL_BASED_OUTPATIENT_CLINIC_OR_DEPARTMENT_OTHER): Admitting: Student

## 2023-10-05 ENCOUNTER — Ambulatory Visit (HOSPITAL_BASED_OUTPATIENT_CLINIC_OR_DEPARTMENT_OTHER): Admitting: Radiology

## 2023-10-08 ENCOUNTER — Ambulatory Visit (HOSPITAL_BASED_OUTPATIENT_CLINIC_OR_DEPARTMENT_OTHER)
Admission: RE | Admit: 2023-10-08 | Discharge: 2023-10-08 | Disposition: A | Discharge status: Home / Self Care | Source: Ambulatory Visit | Attending: Student | Admitting: Student

## 2023-10-08 ENCOUNTER — Encounter (HOSPITAL_BASED_OUTPATIENT_CLINIC_OR_DEPARTMENT_OTHER): Payer: Self-pay | Admitting: Student

## 2023-10-08 ENCOUNTER — Ambulatory Visit (HOSPITAL_BASED_OUTPATIENT_CLINIC_OR_DEPARTMENT_OTHER): Admitting: Student

## 2023-10-08 VITALS — BP 108/73 | HR 93 | Temp 97.6°F | Resp 16 | Ht 68.0 in | Wt 186.1 lb

## 2023-10-08 DIAGNOSIS — Z1231 Encounter for screening mammogram for malignant neoplasm of breast: Secondary | ICD-10-CM | POA: Diagnosis not present

## 2023-10-08 DIAGNOSIS — Z7985 Long-term (current) use of injectable non-insulin antidiabetic drugs: Secondary | ICD-10-CM

## 2023-10-08 DIAGNOSIS — E113523 Type 2 diabetes mellitus with proliferative diabetic retinopathy with traction retinal detachment involving the macula, bilateral: Secondary | ICD-10-CM

## 2023-10-08 DIAGNOSIS — Z0000 Encounter for general adult medical examination without abnormal findings: Secondary | ICD-10-CM

## 2023-10-08 DIAGNOSIS — F5104 Psychophysiologic insomnia: Secondary | ICD-10-CM | POA: Diagnosis not present

## 2023-10-08 DIAGNOSIS — E782 Mixed hyperlipidemia: Secondary | ICD-10-CM

## 2023-10-08 NOTE — Assessment & Plan Note (Signed)
 Diabetes management is well-controlled with blood glucose levels between 110-130 mg/dL. No significant dietary changes, but limited food intake noted. No recent A1c test available. She anticipates improvement in A1c with continued Trulicity  use. - Order A1c test and cholesterol panel - Continue current diabetes medications

## 2023-10-08 NOTE — Assessment & Plan Note (Signed)
 Hyperlipidemia management with Repatha  ongoing. Previous statin therapy was not tolerated due to severe diarrhea. Last cholesterol panel was elevated, possibly due to a lapse in Repatha . - Order lipid panel - Continue Repatha  therapy

## 2023-10-08 NOTE — Assessment & Plan Note (Signed)
 Issues with sleeping, discussed continuing melatonin and starting magnesium glycinate 400mg  daily.

## 2023-10-08 NOTE — Patient Instructions (Addendum)
 It was nice to see you today!  As we discussed in clinic:  Try magnesium glycinate 400mg  daily.  Try to continue walking and doing strength exercises.  If you have any problems before your next visit feel free to message me via MyChart (minor issues or questions) or call the office, otherwise you may reach out to schedule an office visit.  Thank you! Ladeana Laplant, PA-C

## 2023-10-08 NOTE — Progress Notes (Signed)
 Complete physical exam  Patient: Shelly [name]   DOB: [birth year]   53 y.o. Female  MRN: [id number]  Subjective:    Chief Complaint  Patient presents with   Medical Management of Chronic Issues    Here for follow up on nicotine  dependence, diabetes, and DM foot exam.    KIZZY HURTIG is a 53 y.o. female who presents today for a complete physical exam. She reports consuming a general diet. The patient has a physically strenuous job, but has no regular exercise apart from work.  She generally feels well. She reports sleeping poorly. She does not have additional problems to discuss today.    Most recent fall risk assessment:    05/21/2023   11:04 AM  Fall Risk   Number falls in past yr: 1  Injury with Fall? 0  Risk for fall due to : History of fall(s);Impaired vision  Follow up Falls evaluation completed;Follow up appointment     Most recent depression screenings:    10/08/2023   10:26 AM 07/02/2023   10:00 AM  PHQ 2/9 Scores  PHQ - 2 Score 2 6  PHQ- 9 Score 15 20        Patient Care Team: Orvie Caradine T, PA-C as PCP - General (Physician Assistant) Tammie Fall, MD as PCP - Electrophysiology (Cardiology)   Outpatient Medications Prior to Visit  Medication Sig   albuterol  (VENTOLIN  HFA) 108 (90 Base) MCG/ACT inhaler Inhale 2 puffs into the lungs every 4 - 6 hours as needed for wheezing.   apixaban  (ELIQUIS ) 5 MG TABS tablet Take 1 tablet (5 mg total) by mouth 2 (two) times daily.   carvedilol  (COREG ) 3.125 MG tablet Take 1 tablet (3.125 mg total) by mouth 2 (two) times daily.   cholecalciferol (VITAMIN D) 25 MCG (1000 UNIT) tablet Take 1,000 Units by mouth in the morning.   dapagliflozin  propanediol (FARXIGA ) 10 MG TABS tablet Take 1 tablet (10 mg total) by mouth daily.   diclofenac  Sodium (GOODSENSE ARTHRITIS PAIN) 1 % GEL Apply 2 g topically 4 (four) times daily for 7 days to single elbow, wrist or hand; for hand includes palm/fingers/back of hand. (Patient  taking differently: Apply 2 g topically as needed.)   Dulaglutide  (TRULICITY ) 3 MG/0.5ML SOAJ Inject 3 mg into the skin once a week.   Evolocumab  (REPATHA  SURECLICK) 140 MG/ML SOAJ Inject 1 Dose into the skin every 14 (fourteen) days.   fluticasone  (FLONASE ) 50 MCG/ACT nasal spray Place 2 sprays into both nostrils daily.   furosemide  (LASIX ) 20 MG tablet Take 1 tablet (20 mg total) by mouth 3 (three) times a week.   losartan  (COZAAR ) 25 MG tablet Take 1 tablet (25 mg total) by mouth at bedtime.   Melatonin 10 MG TABS Take 10 mg by mouth at bedtime as needed (sleep.).   ondansetron  (ZOFRAN ) 4 MG tablet Take 1 tablet (4 mg total) by mouth every 8 (eight) hours as needed for nausea or vomiting.   Phenylephrine -Acetaminophen  (SINUS CONGESTION/PAIN PO) Take 2 tablets by mouth every 6 (six) hours as needed (sinus).   potassium chloride  SA (KLOR-CON  M) 20 MEQ tablet Take 2 tablets (40 mEq total) by mouth daily for 3 days, THEN 1 tablet (20 mEq total) 3 (three) times a week. (Patient taking differently: THEN 1 tablet (20 mEq total) 3 (three) times a week.)   spironolactone  (ALDACTONE ) 25 MG tablet Take 1 tablet (25 mg total) by mouth daily.   acetaZOLAMIDE  ER (DIAMOX ) 500 MG capsule Take  1 capsule (500 mg total) by mouth in the morning and in the evening. (Patient not taking: Reported on 10/08/2023)   brimonidine  (ALPHAGAN ) 0.2 % ophthalmic solution Place 1 drop into both eyes 3 (three) times daily. (Patient not taking: Reported on 10/08/2023)   brimonidine  (ALPHAGAN ) 0.2 % ophthalmic solution Place 1 drop into the left eye 2 (two) times daily. (Patient not taking: Reported on 10/08/2023)   dorzolamide -timolol  (COSOPT ) 2-0.5 % ophthalmic solution Place 1 drop into both eyes 2 (two) times daily. (Patient not taking: Reported on 10/08/2023)   dorzolamide -timolol  (COSOPT ) 2-0.5 % ophthalmic solution Place 1 drop into the left eye 2 (two) times daily. (Patient not taking: Reported on 10/08/2023)   nicotine  (NICODERM CQ  -  DOSED IN MG/24 HR) 7 mg/24hr patch Place 1 patch (7 mg total) onto the skin daily. (Patient not taking: Reported on 10/08/2023)   prednisoLONE  acetate (PRED FORTE ) 1 % ophthalmic suspension Place 1 drop into the right eye 3 (three) times daily for 3 weeks (Patient not taking: Reported on 10/08/2023)   prednisoLONE  acetate (PRED FORTE ) 1 % ophthalmic suspension Place 1 drop into both eyes 3 (three) times daily for 14 days. (Patient not taking: Reported on 10/08/2023)   No facility-administered medications prior to visit.    ROS  Per HPI     Objective:     BP 108/73   Pulse 93   Temp 97.6 F (36.4 C) (Oral)   Resp 16   Ht 5\' 8"  (1.727 m)   Wt 186 lb 1.6 oz (84.4 kg)   LMP  (LMP Unknown)   SpO2 98%   BMI 28.30 kg/m    Physical Exam Constitutional:      General: She is not in acute distress.    Appearance: Normal appearance. She is not ill-appearing or diaphoretic.  HENT:     Head: Normocephalic and atraumatic.     Right Ear: Tympanic membrane, ear canal and external ear normal.     Left Ear: Tympanic membrane, ear canal and external ear normal.     Nose: Nose normal.     Mouth/Throat:     Mouth: Mucous membranes are moist.     Pharynx: Oropharynx is clear.  Eyes:     General: No scleral icterus.       Right eye: No discharge.        Left eye: No discharge.     Extraocular Movements: Extraocular movements intact.     Conjunctiva/sclera: Conjunctivae normal.     Pupils: Pupils are equal, round, and reactive to light.  Neck:     Thyroid : No thyroid  mass, thyromegaly or thyroid  tenderness.     Vascular: No carotid bruit.  Cardiovascular:     Rate and Rhythm: Normal rate and regular rhythm.     Pulses: Normal pulses.     Heart sounds: Normal heart sounds. No murmur heard.    No friction rub. No gallop.  Pulmonary:     Effort: Pulmonary effort is normal.     Breath sounds: Normal breath sounds. No wheezing, rhonchi or rales.  Chest:     Chest wall: No tenderness.   Abdominal:     General: Bowel sounds are normal. There is no distension.     Palpations: Abdomen is soft.     Tenderness: There is no abdominal tenderness. There is no guarding.  Musculoskeletal:        General: No swelling, deformity or signs of injury.     Cervical back: Neck supple.  Right lower leg: No edema.     Left lower leg: No edema.  Lymphadenopathy:     Cervical:     Right cervical: No superficial or posterior cervical adenopathy.    Left cervical: No superficial cervical adenopathy.  Skin:    Coloration: Skin is not jaundiced.     Findings: No rash.  Neurological:     General: No focal deficit present.     Mental Status: She is alert and oriented to person, place, and time.     Deep Tendon Reflexes: Reflexes normal.  Psychiatric:        Behavior: Behavior normal.      No results found for any visits on 10/08/23.     Assessment & Plan:    Routine Health Maintenance and Physical Exam  Health Maintenance  Topic Date Due   Mammogram  Never done   Pap with HPV screening  11/08/2023*   Pneumococcal Vaccination (1 of 2 - PCV) 11/08/2023*   Eye exam for diabetics  11/08/2023*   COVID-19 Vaccine (1) 11/08/2023*   Medicare Annual Wellness Visit  11/08/2023*   Hepatitis C Screening  11/08/2023*   Zoster (Shingles) Vaccine (1 of 2) 11/08/2023*   Colon Cancer Screening  07/15/2024*   Hemoglobin A1C  12/30/2023   Flu Shot  01/08/2024   Yearly kidney health urinalysis for diabetes  07/01/2024   Yearly kidney function blood test for diabetes  08/26/2024   Complete foot exam   10/07/2024   DTaP/Tdap/Td vaccine (2 - Td or Tdap) 06/09/2028   HIV Screening  Completed   HPV Vaccine  Aged Out   Meningitis B Vaccine  Aged Out  *Topic was postponed. The date shown is not the original due date.    Discussed health benefits of physical activity, and encouraged her to engage in regular exercise appropriate for her age and condition.  Adult general medical  exam Assessment & Plan: Routine wellness visit conducted. Discussed current health status, medications, and lifestyle habits. She prefers to maintain continuity of care with current provider. - Perform foot exam - Conduct physical examination   Mixed hyperlipidemia Assessment & Plan: Hyperlipidemia management with Repatha  ongoing. Previous statin therapy was not tolerated due to severe diarrhea. Last cholesterol panel was elevated, possibly due to a lapse in Repatha . 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 No significant dietary changes, but limited food intake noted. No recent A1c test available. She anticipates improvement in A1c with continued Trulicity  use. - Order A1c test and cholesterol panel - Continue current diabetes medications  Orders: -     Hemoglobin A1c; Future  Psychophysiological insomnia Assessment & Plan: Issues with sleeping, discussed continuing melatonin and starting magnesium glycinate 400mg  daily.  Orders: -     Magnesium; Future    Return in about 6 months (around 04/09/2024) for Chronic Followup, DM.     Laron Plummer Corvette Orser, PA-C

## 2023-10-08 NOTE — Assessment & Plan Note (Signed)
 Routine wellness visit conducted. Discussed current health status, medications, and lifestyle habits. She prefers to maintain continuity of care with current provider. - Perform foot exam - Conduct physical examination

## 2023-10-12 ENCOUNTER — Encounter (HOSPITAL_BASED_OUTPATIENT_CLINIC_OR_DEPARTMENT_OTHER): Payer: Self-pay | Admitting: Student

## 2023-10-23 ENCOUNTER — Other Ambulatory Visit (HOSPITAL_COMMUNITY): Payer: Self-pay

## 2023-10-26 ENCOUNTER — Ambulatory Visit (HOSPITAL_COMMUNITY)
Admission: RE | Admit: 2023-10-26 | Discharge: 2023-10-26 | Disposition: A | Discharge status: Home / Self Care | Source: Ambulatory Visit | Attending: Student | Admitting: Student

## 2023-10-26 ENCOUNTER — Other Ambulatory Visit (HOSPITAL_COMMUNITY): Payer: Self-pay

## 2023-10-26 DIAGNOSIS — I5022 Chronic systolic (congestive) heart failure: Secondary | ICD-10-CM | POA: Insufficient documentation

## 2023-10-26 LAB — ECHOCARDIOGRAM COMPLETE: S' Lateral: 4.2 cm

## 2023-10-26 MED ORDER — PERFLUTREN LIPID MICROSPHERE
1.0000 mL | INTRAVENOUS | Status: AC | PRN
  Administered 2023-10-26: 2 mL via INTRAVENOUS

## 2023-10-27 ENCOUNTER — Ambulatory Visit: Payer: Self-pay | Admitting: Student

## 2023-10-28 ENCOUNTER — Telehealth (HOSPITAL_COMMUNITY): Payer: Self-pay | Admitting: Cardiology

## 2023-10-28 NOTE — Telephone Encounter (Signed)
 Pt called to request that HF provider  review echo done 5/19  MyChart Reports changes since last one and has concerns with  The mitral valve is abnormal. There is mild thickening of  the mitral valve leaflet(s). There is mild calcification of the mitral  valve leaflet(s). No evidence of mitral valve regurgitation. No evidence  of mitral valve stenosis.   . The left ventricular internal cavity  size was mildly dilated    No mention of clot in report so patient would like to know if clot has resolve

## 2023-10-29 NOTE — Telephone Encounter (Signed)
Pt aware.

## 2023-11-06 ENCOUNTER — Other Ambulatory Visit (HOSPITAL_COMMUNITY): Payer: Self-pay

## 2023-11-06 ENCOUNTER — Other Ambulatory Visit: Payer: Self-pay | Admitting: Internal Medicine

## 2023-11-06 ENCOUNTER — Other Ambulatory Visit: Payer: Self-pay

## 2023-11-06 DIAGNOSIS — I2510 Atherosclerotic heart disease of native coronary artery without angina pectoris: Secondary | ICD-10-CM

## 2023-11-06 DIAGNOSIS — E785 Hyperlipidemia, unspecified: Secondary | ICD-10-CM

## 2023-11-06 MED ORDER — REPATHA SURECLICK 140 MG/ML ~~LOC~~ SOAJ
140.0000 mg | SUBCUTANEOUS | 3 refills | Status: AC
  Filled 2023-11-06: qty 6, 84d supply, fill #0
  Filled 2024-02-02: qty 6, 84d supply, fill #1
  Filled 2024-04-28: qty 6, 84d supply, fill #2

## 2023-11-19 ENCOUNTER — Other Ambulatory Visit (HOSPITAL_COMMUNITY): Payer: Self-pay

## 2023-11-20 ENCOUNTER — Ambulatory Visit (INDEPENDENT_AMBULATORY_CARE_PROVIDER_SITE_OTHER): Payer: Commercial Managed Care - HMO

## 2023-11-20 ENCOUNTER — Other Ambulatory Visit (HOSPITAL_COMMUNITY): Payer: Self-pay

## 2023-11-20 DIAGNOSIS — I255 Ischemic cardiomyopathy: Secondary | ICD-10-CM | POA: Diagnosis not present

## 2023-11-20 LAB — CUP PACEART REMOTE DEVICE CHECK
Battery Remaining Longevity: 162 mo
Battery Remaining Percentage: 100 %
Brady Statistic RV Percent Paced: 0 %
Date Time Interrogation Session: 20250613002200
HighPow Impedance: 76 Ohm
Implantable Lead Connection Status: 753985
Implantable Lead Implant Date: 20230615
Implantable Lead Location: 753860
Implantable Lead Model: 137
Implantable Lead Serial Number: 301168
Implantable Pulse Generator Implant Date: 20230615
Lead Channel Impedance Value: 527 Ohm
Lead Channel Pacing Threshold Amplitude: 0.8 V
Lead Channel Pacing Threshold Pulse Width: 0.4 ms
Lead Channel Setting Pacing Amplitude: 2.5 V
Lead Channel Setting Pacing Pulse Width: 0.4 ms
Lead Channel Setting Sensing Sensitivity: 0.5 mV
Pulse Gen Serial Number: 217479
Zone Setting Status: 755011

## 2023-11-22 ENCOUNTER — Ambulatory Visit: Payer: Self-pay | Admitting: Internal Medicine

## 2023-12-03 ENCOUNTER — Other Ambulatory Visit (HOSPITAL_COMMUNITY): Payer: Self-pay

## 2023-12-03 MED FILL — Dapagliflozin Propanediol Tab 10 MG (Base Equivalent): ORAL | 90 days supply | Qty: 90 | Fill #1 | Status: AC

## 2023-12-07 ENCOUNTER — Ambulatory Visit (HOSPITAL_BASED_OUTPATIENT_CLINIC_OR_DEPARTMENT_OTHER)
Admission: EM | Admit: 2023-12-07 | Discharge: 2023-12-07 | Disposition: A | Discharge status: Home / Self Care | Attending: Family Medicine | Admitting: Family Medicine

## 2023-12-07 ENCOUNTER — Encounter (HOSPITAL_BASED_OUTPATIENT_CLINIC_OR_DEPARTMENT_OTHER): Payer: Self-pay | Admitting: Emergency Medicine

## 2023-12-07 DIAGNOSIS — J321 Chronic frontal sinusitis: Secondary | ICD-10-CM | POA: Diagnosis not present

## 2023-12-07 MED ORDER — AMOXICILLIN 875 MG PO TABS
875.0000 mg | ORAL_TABLET | Freq: Two times a day (BID) | ORAL | 0 refills | Status: AC
  Filled 2023-12-07: qty 14, 7d supply, fill #0

## 2023-12-07 NOTE — ED Triage Notes (Signed)
 Pt st's she thinks she has a sinus infection  c/o pain in face and forehead

## 2023-12-07 NOTE — Discharge Instructions (Signed)
 Treating you for a sinus infection.  Take the antibiotics as prescribed.  You can continue over-the-counter medications for symptoms as needed Follow-up with your doctor for continued issues

## 2023-12-08 ENCOUNTER — Other Ambulatory Visit (HOSPITAL_COMMUNITY): Payer: Self-pay

## 2023-12-08 NOTE — ED Provider Notes (Signed)
 [facility]    CSN: [id number] Arrival date & time: 12/07/23  1734      History   Chief Complaint Chief Complaint  Patient presents with   Facial Pain    HPI Shelly [name] is a 53 y.o. female.   Patient is a 53 year old female presents today with sinus pressure, headache, facial pain, congestion.  Symptoms have been constant and not resolving over the past week.  Reports similar to previous sinus infections.  She has been taking sinus medication over-the-counter which does help within the problem recurs.  She denies any fever, chills, body aches, cough or chest congestion.     Past Medical History:  Diagnosis Date   AICD (automatic cardioverter/defibrillator) present    Anemia    Anxiety    Arthritis    Cataract    Mixed OU   CHF (congestive heart failure) (HCC)    Coronary artery disease    Detached retina, bilateral    Diabetes mellitus without complication (HCC)    type 2   Diabetic retinopathy (HCC)    PDR OU   Dizziness    Headache    Cluster headaches in the past   History of kidney stones    2 sitting   Hyperlipidemia    Hypertension    Hypertensive retinopathy    OU   Myocardial infarction (HCC)    Pneumonia    Syncope    Tobacco abuse     Patient Active Problem List   Diagnosis Date Noted   Adult general medical exam 10/08/2023   Mixed hyperlipidemia 10/08/2023   Psychophysiological insomnia 10/08/2023   Dysgeusia 09/23/2023   Nasal drainage 09/23/2023   Acute non-recurrent frontal sinusitis 08/13/2023   Encounter for smoking cessation counseling 07/02/2023   Acute cystitis with hematuria 06/05/2023   Diabetes mellitus (HCC) 05/21/2023   Central retinal vein occlusion 05/21/2023   Pedal edema 05/21/2023   Shoulder impingement, right 05/21/2023   Basal cell carcinoma (BCC) of left forehead 05/21/2023   ICD (implantable cardioverter-defibrillator) in place 03/27/2022   Ischemic cardiomyopathy 03/27/2022   Obesity 10/14/2019    Pericardial effusion 10/14/2019   Ascites 10/14/2019   CHF (congestive heart failure) (HCC) 10/14/2019   Acute heart failure (HCC) 10/13/2019   Chest pain    Hyperbilirubinemia    Current smoker     Past Surgical History:  Procedure Laterality Date   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 Redell Hans   ICD IMPLANT N/A 11/21/2021   Procedure: ICD IMPLANT;  Surgeon: Waddell Danelle ORN, MD;  Location: [facility] INVASIVE CV LAB;  Service: Cardiovascular;  Laterality: N/A;   INJECTION OF SILICONE OIL Left 08/23/2020   Procedure: INJECTION OF SILICONE OIL;  Surgeon: Hans Redell, MD;  Location: [facility] OR;  Service: Ophthalmology;  Laterality: Left;   INJECTION OF SILICONE OIL Right 10/04/2020   Procedure: INJECTION OF SILICONE OIL;  Surgeon: Hans Redell, MD;  Location: [facility] OR;  Service: Ophthalmology;  Laterality: Right;   MEMBRANE PEEL Left 08/23/2020   Procedure: MEMBRANE PEEL;  Surgeon: Hans Redell, MD;  Location: [facility] OR;  Service: Ophthalmology;  Laterality: Left;   MEMBRANE PEEL Right 10/04/2020   Procedure: MEMBRANE PEEL;  Surgeon: Hans Redell, MD;  Location: [facility] OR;  Service: Ophthalmology;  Laterality: Right;   PARS PLANA VITRECTOMY Left 08/23/2020   Procedure: PARS PLANA  VITRECTOMY WITH 25 GAUGE WITH ENDOLASER;  Surgeon: Valdemar Rogue, MD;  Location: [facility] OR;  Service: Ophthalmology;  Laterality: Left;   PARS PLANA VITRECTOMY Right 10/04/2020   Procedure: PARS PLANA VITRECTOMY WITH 25 GAUGE, INJECTION OF AVASTIN ;  Surgeon: Valdemar Rogue, MD;  Location: [facility] OR;  Service: Ophthalmology;  Laterality: Right;   PHOTOCOAGULATION WITH LASER Right 10/04/2020   Procedure: PHOTOCOAGULATION WITH LASER;  Surgeon: Valdemar Rogue, MD;  Location: [facility] OR;  Service: Ophthalmology;  Laterality: Right;   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/23/2020    Procedure: REPAIR OF COMPLEX TRACTION RETINAL DETACHMENT;  Surgeon: Valdemar Rogue, MD;  Location: [facility] OR;  Service: Ophthalmology;  Laterality: Left;   RIGHT/LEFT HEART CATH AND CORONARY ANGIOGRAPHY N/A 10/14/2019   Procedure: RIGHT/LEFT HEART CATH AND CORONARY ANGIOGRAPHY;  Surgeon: Cherrie Toribio SAUNDERS, MD;  Location: [facility] INVASIVE CV LAB;  Service: Cardiovascular;  Laterality: N/A;   TUBAL LIGATION      OB History   No obstetric history on file.      Home Medications    Prior to Admission medications   Medication Sig Start Date End Date Taking? Authorizing Provider  amoxicillin  (AMOXIL ) 875 MG tablet Take 1 tablet (875 mg total) by mouth 2 (two) times daily for 7 days. 12/07/23 12/15/23 Yes Cena Bruhn A, FNP  acetaZOLAMIDE  ER (DIAMOX ) 500 MG capsule Take 1 capsule (500 mg total) by mouth in the morning and in the evening. Patient not taking: Reported on 10/08/2023 08/26/23     albuterol  (VENTOLIN  HFA) 108 (90 Base) MCG/ACT inhaler Inhale 2 puffs into the lungs every 4 - 6 hours as needed for wheezing. 07/22/22     apixaban  (ELIQUIS ) 5 MG TABS tablet Take 1 tablet (5 mg total) by mouth 2 (two) times daily. 02/02/23   Bensimhon, Toribio SAUNDERS, MD  brimonidine  (ALPHAGAN ) 0.2 % ophthalmic solution Place 1 drop into both eyes 3 (three) times daily. Patient not taking: Reported on 10/08/2023 08/26/23     brimonidine  (ALPHAGAN ) 0.2 % ophthalmic solution Place 1 drop into the left eye 2 (two) times daily. Patient not taking: Reported on 10/08/2023 08/26/23     carvedilol  (COREG ) 3.125 MG tablet Take 1 tablet (3.125 mg total) by mouth 2 (two) times daily. 03/10/23   Bensimhon, Toribio SAUNDERS, MD  cholecalciferol (VITAMIN D) 25 MCG (1000 UNIT) tablet Take 1,000 Units by mouth in the morning.    [provider]  dapagliflozin  propanediol (FARXIGA ) 10 MG TABS tablet Take 1 tablet (10 mg total) by mouth daily. 09/04/23   Bensimhon, Toribio SAUNDERS, MD  diclofenac  Sodium (GOODSENSE ARTHRITIS PAIN) 1 % GEL Apply 2 g topically 4  (four) times daily for 7 days to single elbow, wrist or hand; for hand includes palm/fingers/back of hand. Patient taking differently: Apply 2 g topically as needed. 01/19/23     dorzolamide -timolol  (COSOPT ) 2-0.5 % ophthalmic solution Place 1 drop into both eyes 2 (two) times daily. Patient not taking: Reported on 10/08/2023 08/26/23     dorzolamide -timolol  (COSOPT ) 2-0.5 % ophthalmic solution Place 1 drop into the left eye 2 (two) times daily. Patient not taking: Reported on 10/08/2023 08/26/23     Dulaglutide  (TRULICITY ) 3 MG/0.5ML SOAJ Inject 3 mg into the skin once a week. 09/02/23   Rothfuss, Jacob T, PA-C  Evolocumab  (REPATHA  SURECLICK) 140 MG/ML SOAJ Inject 140 mg into the skin every 14 (fourteen) days. 11/06/23   Hilty, Vinie BROCKS, MD  fluticasone  (FLONASE ) 50 MCG/ACT nasal spray Place 2 sprays into both nostrils daily.  09/23/23   Rothfuss, Jacob T, PA-C  furosemide  (LASIX ) 20 MG tablet Take 1 tablet (20 mg total) by mouth 3 (three) times a week. 09/04/23   Bensimhon, Toribio SAUNDERS, MD  losartan  (COZAAR ) 25 MG tablet Take 1 tablet (25 mg total) by mouth at bedtime. 01/16/23   Bensimhon, Toribio SAUNDERS, MD  Melatonin 10 MG TABS Take 10 mg by mouth at bedtime as needed (sleep.).    [provider]  nicotine  (NICODERM CQ  - DOSED IN MG/24 HR) 7 mg/24hr patch Place 1 patch (7 mg total) onto the skin daily. Patient not taking: Reported on 10/08/2023 08/13/23   Rothfuss, Jacob T, PA-C  ondansetron  (ZOFRAN ) 4 MG tablet Take 1 tablet (4 mg total) by mouth every 8 (eight) hours as needed for nausea or vomiting. 06/29/23   Rothfuss, Jacob T, PA-C  Phenylephrine -Acetaminophen  (SINUS CONGESTION/PAIN PO) Take 2 tablets by mouth every 6 (six) hours as needed (sinus).    [provider]  potassium chloride  SA (KLOR-CON  M) 20 MEQ tablet Take 2 tablets (40 mEq total) by mouth daily for 3 days, THEN 1 tablet (20 mEq total) 3 (three) times a week. Patient taking differently: THEN 1 tablet (20 mEq total) 3 (three) times a  week. 08/18/23 12/30/23  Glena Harlene HERO, FNP  prednisoLONE  acetate (PRED FORTE ) 1 % ophthalmic suspension Place 1 drop into the right eye 3 (three) times daily for 3 weeks Patient not taking: Reported on 10/08/2023 09/09/23     spironolactone  (ALDACTONE ) 25 MG tablet Take 1 tablet (25 mg total) by mouth daily. 09/04/23   Bensimhon, Toribio SAUNDERS, MD    Family History Family History  Problem Relation Age of Onset   Sudden Cardiac Death Mother    CAD Father    CAD Paternal Grandmother    CAD Paternal Grandfather     Social History Social History   Tobacco Use   Smoking status: Every Day    Current packs/day: 0.50    Average packs/day: 0.5 packs/day for 37.5 years (18.7 ttl pk-yrs)    Types: Cigarettes    Start date: 1988    Passive exposure: Current   Smokeless tobacco: Never  Vaping Use   Vaping status: Never Used  Substance Use Topics   Alcohol use: Not Currently   Drug use: Never     Allergies   Crestor  [rosuvastatin ] and Lipitor [atorvastatin ]   Review of Systems Review of Systems  See HPI Physical Exam Triage Vital Signs ED Triage Vitals  Encounter Vitals Group     BP 12/07/23 1831 113/77     Girls Systolic BP Percentile --      Girls Diastolic BP Percentile --      Boys Systolic BP Percentile --      Boys Diastolic BP Percentile --      Pulse Rate 12/07/23 1831 83     Resp 12/07/23 1831 18     Temp 12/07/23 1831 98.5 F (36.9 C)     Temp Source 12/07/23 1831 Oral     SpO2 12/07/23 1831 95 %     Weight --      Height --      Head Circumference --      Peak Flow --      Pain Score 12/07/23 1832 1     Pain Loc --      Pain Education --      Exclude from Growth Chart --    No data found.  Updated Vital Signs BP 113/77 (BP  Location: Right Arm)   Pulse 83   Temp 98.5 F (36.9 C) (Oral)   Resp 18   LMP  (LMP Unknown)   SpO2 95%   Visual Acuity Right Eye Distance:   Left Eye Distance:   Bilateral Distance:    Right Eye Near:   Left Eye Near:     Bilateral Near:     Physical Exam Vitals and nursing note reviewed.  Constitutional:      General: She is not in acute distress.    Appearance: Normal appearance. She is not ill-appearing, toxic-appearing or diaphoretic.  HENT:     Nose: Congestion present.     Right Sinus: Frontal sinus tenderness present.     Left Sinus: Frontal sinus tenderness present.  Pulmonary:     Effort: Pulmonary effort is normal.   Neurological:     Mental Status: She is alert.   Psychiatric:        Mood and Affect: Mood normal.      UC Treatments / Results  Labs (all labs ordered are listed, but only abnormal results are displayed) Labs Reviewed - No data to display  EKG   Radiology No results found.  Procedures Procedures (including critical care time)  Medications Ordered in UC Medications - No data to display  Initial Impression / Assessment and Plan / UC Course  I have reviewed the triage vital signs and the nursing notes.  Pertinent labs & imaging results that were available during my care of the patient were reviewed by me and considered in my medical decision making (see chart for details).     Sinusitis-treating for sinus infection with amoxicillin .  Recommend continue over-the-counter medications as needed.  Follow-up as needed Final Clinical Impressions(s) / UC Diagnoses   Final diagnoses:  Chronic frontal sinusitis     Discharge Instructions      Treating you for a sinus infection.  Take the antibiotics as prescribed.  You can continue over-the-counter medications for symptoms as needed Follow-up with your doctor for continued issues   ED Prescriptions     Medication Sig Dispense Auth. Provider   amoxicillin  (AMOXIL ) 875 MG tablet Take 1 tablet (875 mg total) by mouth 2 (two) times daily for 7 days. 14 tablet Adah Wilbert LABOR, FNP      PDMP not reviewed this encounter.   Adah Wilbert LABOR, FNP 12/08/23 [phone]

## 2023-12-15 ENCOUNTER — Other Ambulatory Visit (HOSPITAL_COMMUNITY): Payer: Self-pay | Admitting: Internal Medicine

## 2023-12-15 ENCOUNTER — Other Ambulatory Visit (HOSPITAL_COMMUNITY): Payer: Self-pay

## 2023-12-15 ENCOUNTER — Other Ambulatory Visit (HOSPITAL_BASED_OUTPATIENT_CLINIC_OR_DEPARTMENT_OTHER): Payer: Self-pay | Admitting: Student

## 2023-12-15 ENCOUNTER — Ambulatory Visit: Payer: Self-pay

## 2023-12-15 DIAGNOSIS — J0110 Acute frontal sinusitis, unspecified: Secondary | ICD-10-CM

## 2023-12-15 MED ORDER — LEVOFLOXACIN 750 MG PO TABS
750.0000 mg | ORAL_TABLET | Freq: Every day | ORAL | 0 refills | Status: AC
  Filled 2023-12-15: qty 7, 7d supply, fill #0

## 2023-12-15 MED ORDER — CARVEDILOL 3.125 MG PO TABS
3.1250 mg | ORAL_TABLET | Freq: Two times a day (BID) | ORAL | 2 refills | Status: AC
  Filled 2023-12-15: qty 180, 90d supply, fill #0
  Filled 2024-03-22: qty 180, 90d supply, fill #1
  Filled 2024-06-28: qty 180, 90d supply, fill #2

## 2023-12-15 MED ORDER — ONDANSETRON 4 MG PO TBDP
4.0000 mg | ORAL_TABLET | Freq: Three times a day (TID) | ORAL | 0 refills | Status: DC | PRN
  Filled 2023-12-15: qty 20, 7d supply, fill #0

## 2023-12-15 NOTE — Telephone Encounter (Signed)
 FYI Only or Action Required?: Action required by provider: wants antibiotics called in; states visually impaired and does not have a ride; declined to go back to UC/make an appointment.  Patient was last seen in primary care on NA.  Called Nurse Triage reporting Sinusitis.  Symptoms began a week ago.  Interventions attempted: Prescription medications: amoxicillin .  Symptoms are: unchanged.  Triage Disposition: See Physician Within 24 Hours  Patient/caregiver understands and will follow disposition?: No, wishes to speak with PCP     Copied from CRM [id number]. Topic: Clinical - Red Word Triage >> Dec 15, 2023  9:18 AM [place] MATSU wrote: Red Word that prompted transfer to Nurse Triage: painful pressure in forehead between eyes patient had sinus infection took antibiotics but symptoms still persistent Reason for Disposition  [1] Taking antibiotic > 72 hours (3 days) AND [2] sinus pain not improved  Answer Assessment - Initial Assessment Questions 1. ANTIBIOTIC: What antibiotic are you taking? How many times a day?     amoxicilling 2. ONSET: When was the antibiotic started?     Last Monday 3. PAIN: How bad is the sinus pain?   (Scale 1-10; mild, moderate or severe)   - MILD (1-3): doesn't interfere with normal activities    - MODERATE (4-7): interferes with normal activities (e.g., work or school) or awakens from sleep   - SEVERE (8-10): excruciating pain and patient unable to do any normal activities        Mild 4. FEVER: Do you have a fever? If Yes, ask: What is it, how was it measured, and when did it start?      no 5. SYMPTOMS: Are there any other symptoms you're concerned about? If Yes, ask: When did it start?     Forehead and in between eyes. 6. PREGNANCY: Is there any chance you are pregnant? When was your last menstrual period?     na  Protocols used: Sinus Infection on Antibiotic Follow-up Call-[facility]

## 2023-12-25 ENCOUNTER — Other Ambulatory Visit (HOSPITAL_COMMUNITY): Payer: Self-pay

## 2023-12-28 DIAGNOSIS — H26493 Other secondary cataract, bilateral: Secondary | ICD-10-CM | POA: Diagnosis not present

## 2024-01-07 NOTE — Progress Notes (Signed)
 Remote ICD transmission.

## 2024-01-19 ENCOUNTER — Other Ambulatory Visit (HOSPITAL_COMMUNITY): Payer: Self-pay | Admitting: Internal Medicine

## 2024-01-19 ENCOUNTER — Other Ambulatory Visit (HOSPITAL_COMMUNITY): Payer: Self-pay

## 2024-01-20 ENCOUNTER — Other Ambulatory Visit: Payer: Self-pay

## 2024-01-20 ENCOUNTER — Other Ambulatory Visit (HOSPITAL_COMMUNITY): Payer: Self-pay

## 2024-01-20 MED ORDER — LOSARTAN POTASSIUM 25 MG PO TABS
25.0000 mg | ORAL_TABLET | Freq: Every evening | ORAL | 3 refills | Status: AC
  Filled 2024-01-20: qty 90, 90d supply, fill #0
  Filled 2024-04-04: qty 90, 90d supply, fill #1
  Filled 2024-06-28: qty 90, 90d supply, fill #2

## 2024-02-02 ENCOUNTER — Other Ambulatory Visit (HOSPITAL_COMMUNITY): Payer: Self-pay | Admitting: Internal Medicine

## 2024-02-02 ENCOUNTER — Other Ambulatory Visit (HOSPITAL_COMMUNITY): Payer: Self-pay

## 2024-02-02 MED ORDER — APIXABAN 5 MG PO TABS
5.0000 mg | ORAL_TABLET | Freq: Two times a day (BID) | ORAL | 11 refills | Status: AC
  Filled 2024-02-02: qty 60, 30d supply, fill #0
  Filled 2024-02-29: qty 60, 30d supply, fill #1
  Filled 2024-04-04: qty 60, 30d supply, fill #2
  Filled 2024-04-28: qty 60, 30d supply, fill #3
  Filled 2024-05-30: qty 60, 30d supply, fill #4
  Filled 2024-06-28 (×2): qty 60, 30d supply, fill #5

## 2024-02-19 ENCOUNTER — Ambulatory Visit (INDEPENDENT_AMBULATORY_CARE_PROVIDER_SITE_OTHER): Payer: Commercial Managed Care - HMO

## 2024-02-19 DIAGNOSIS — I255 Ischemic cardiomyopathy: Secondary | ICD-10-CM | POA: Diagnosis not present

## 2024-02-19 LAB — CUP PACEART REMOTE DEVICE CHECK
Battery Remaining Longevity: 162 mo
Battery Remaining Percentage: 100 %
Brady Statistic RV Percent Paced: 0 %
Date Time Interrogation Session: 20250912002100
HighPow Impedance: 98 Ohm
Implantable Lead Connection Status: 753985
Implantable Lead Implant Date: 20230615
Implantable Lead Location: 753860
Implantable Lead Model: 137
Implantable Lead Serial Number: 301168
Implantable Pulse Generator Implant Date: 20230615
Lead Channel Impedance Value: 514 Ohm
Lead Channel Pacing Threshold Amplitude: 0.8 V
Lead Channel Pacing Threshold Pulse Width: 0.4 ms
Lead Channel Setting Pacing Amplitude: 2.5 V
Lead Channel Setting Pacing Pulse Width: 0.4 ms
Lead Channel Setting Sensing Sensitivity: 0.5 mV
Pulse Gen Serial Number: 217479
Zone Setting Status: 755011

## 2024-02-23 ENCOUNTER — Other Ambulatory Visit (HOSPITAL_COMMUNITY): Payer: Self-pay | Admitting: Family Medicine

## 2024-02-23 ENCOUNTER — Other Ambulatory Visit (HOSPITAL_COMMUNITY): Payer: Self-pay

## 2024-02-24 ENCOUNTER — Other Ambulatory Visit (HOSPITAL_COMMUNITY): Payer: Self-pay

## 2024-02-24 ENCOUNTER — Other Ambulatory Visit: Payer: Self-pay

## 2024-02-24 MED ORDER — POTASSIUM CHLORIDE CRYS ER 20 MEQ PO TBCR
EXTENDED_RELEASE_TABLET | ORAL | 3 refills | Status: DC
  Filled 2024-02-24: qty 20, 33d supply, fill #0

## 2024-02-25 NOTE — Progress Notes (Signed)
Remote ICD Transmission.

## 2024-02-26 ENCOUNTER — Ambulatory Visit: Payer: Self-pay | Admitting: Internal Medicine

## 2024-02-29 ENCOUNTER — Other Ambulatory Visit (HOSPITAL_COMMUNITY): Payer: Self-pay

## 2024-02-29 MED FILL — Dapagliflozin Propanediol Tab 10 MG (Base Equivalent): ORAL | 90 days supply | Qty: 90 | Fill #2 | Status: AC

## 2024-03-03 ENCOUNTER — Encounter (HOSPITAL_COMMUNITY): Payer: Self-pay | Admitting: Internal Medicine

## 2024-03-03 ENCOUNTER — Ambulatory Visit (HOSPITAL_COMMUNITY)
Admission: RE | Admit: 2024-03-03 | Discharge: 2024-03-03 | Disposition: A | Discharge status: Home / Self Care | Source: Ambulatory Visit | Attending: Internal Medicine | Admitting: Internal Medicine

## 2024-03-03 VITALS — BP 110/70 | HR 78 | Wt 193.2 lb

## 2024-03-03 DIAGNOSIS — R0683 Snoring: Secondary | ICD-10-CM | POA: Insufficient documentation

## 2024-03-03 DIAGNOSIS — Z79899 Other long term (current) drug therapy: Secondary | ICD-10-CM | POA: Insufficient documentation

## 2024-03-03 DIAGNOSIS — I5022 Chronic systolic (congestive) heart failure: Secondary | ICD-10-CM | POA: Insufficient documentation

## 2024-03-03 DIAGNOSIS — Z7901 Long term (current) use of anticoagulants: Secondary | ICD-10-CM | POA: Diagnosis not present

## 2024-03-03 DIAGNOSIS — Z95810 Presence of automatic (implantable) cardiac defibrillator: Secondary | ICD-10-CM | POA: Diagnosis not present

## 2024-03-03 DIAGNOSIS — E785 Hyperlipidemia, unspecified: Secondary | ICD-10-CM | POA: Diagnosis not present

## 2024-03-03 DIAGNOSIS — E11319 Type 2 diabetes mellitus with unspecified diabetic retinopathy without macular edema: Secondary | ICD-10-CM | POA: Insufficient documentation

## 2024-03-03 DIAGNOSIS — Z7984 Long term (current) use of oral hypoglycemic drugs: Secondary | ICD-10-CM | POA: Insufficient documentation

## 2024-03-03 DIAGNOSIS — I513 Intracardiac thrombosis, not elsewhere classified: Secondary | ICD-10-CM | POA: Insufficient documentation

## 2024-03-03 DIAGNOSIS — I255 Ischemic cardiomyopathy: Secondary | ICD-10-CM | POA: Insufficient documentation

## 2024-03-03 DIAGNOSIS — R55 Syncope and collapse: Secondary | ICD-10-CM | POA: Diagnosis not present

## 2024-03-03 DIAGNOSIS — I2510 Atherosclerotic heart disease of native coronary artery without angina pectoris: Secondary | ICD-10-CM | POA: Insufficient documentation

## 2024-03-03 DIAGNOSIS — Z720 Tobacco use: Secondary | ICD-10-CM

## 2024-03-03 DIAGNOSIS — F17210 Nicotine dependence, cigarettes, uncomplicated: Secondary | ICD-10-CM | POA: Diagnosis not present

## 2024-03-03 NOTE — Patient Instructions (Signed)
 Medication Changes:  STOP POTASSIUM   Lab Work:  RETURN FOR LABS IN 1 WEEK AS SCHEDULED   Follow-Up in: 6 MONTHS WITH DR. CHERRIE PLEASE CALL OUR OFFICE AROUND JANUARY  TO GET SCHEDULED FOR YOUR MARCH APPOINTMENT. PHONE NUMBER IS [phone] OPTION 2 R  At the [facility], you and your health needs are our priority. We have a designated team specialized in the treatment of Heart Failure. This Care Team includes your primary Heart Failure Specialized Cardiologist (physician), Advanced Practice Providers (APPs- Physician Assistants and Nurse Practitioners), and Pharmacist who all work together to provide you with the care you need, when you need it.   You may see any of the following providers on your designated Care Team at your next follow up:  Dr. Toribio CHERRIE Dr. Ezra Shuck Dr. Ria Commander Dr. Odis Brownie Greig Mosses, NP [place]affie Shed, [place] [facility] [place], [place] Beckey Coe, NP [place] Lee, NP Tinnie Redman, PharmD   Please be sure to bring in all your medications bottles to every appointment.   Need to Contact Us :  If you have any questions or concerns before your next appointment please send us  a message through [place] or call our office at [phone].    TO LEAVE A MESSAGE FOR THE NURSE SELECT OPTION 2, PLEASE LEAVE A MESSAGE INCLUDING: YOUR NAME DATE OF BIRTH CALL BACK NUMBER REASON FOR CALL**this is important as we prioritize the call backs  YOU WILL RECEIVE A CALL BACK THE SAME DAY AS LONG AS YOU CALL BEFORE 4:00 PM

## 2024-03-03 NOTE — Progress Notes (Signed)
 [facility] Note    PCP: Rothfuss, Lang DASEN, PA-C  Cardiologist: Dr. Dann HF: Dr. Cherrie   HPI: Shelly [name] is a 53 y.o. woman with DM2, tobacco abuse, CAD and systolic HF diagnosed in 5/21 with EF 20%.  Admitted 5/21 with ADHF. Echo EF 20% with severe RV dysfunction. R/LHC showed diffuse non-obstructive CAD with chronic dissection/recannalization of mid LAD. Suspect she had an out of hospital MI at some point.  There were no interventional targets.  Outpatient cMRI 8/21 1.  Mild LV dilatation with severe systolic dysfunction (LVEF 30%) 2. Subendocardial LGE c/w prior LAD and RCA infarcts. >50% transmural LGE suggesting nonviability in basal to mid inferior walls, apical anterior/septal walls, and apex. <50% transmurality of LGE suggesting viability in mid anterior wall. 3.  Normal RV size and systolic function (EF 66%)  Echo 01/15/21:  EF 35-40% RV ok. BP low. Entresto  decreased, lasix  cut back to MWF.  Zio-14 day (only able to wear for 4) showed mostly SR, no high-grade arrhythmias.  Follow up 2/23 had syncopal episode 1 day prior. NYHA II, volume ok. She was referred to EP for ILR to exclude high-grade arrhythmias.   Seen in ED 08/11/21 for syncopal episode. CT negative for PE. Orthostatics + with BP dropping to 80s and symptomatic, advised admission for re-hydration but she left AMA.  Echo 03/23: EF 20-25%, RV okay  14 day zio 03/23: SR with rare PACs and occasional PVCs. No significant arrhythmias.  She underwent ICD placement by Dr. Waddell in June 2023.  Echo 8/24 EF ~30% (read as 20-25%) + apical clot  S/p barostim 8/24.  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  Today she returns for HF follow up with her daughter. Vision is slightly better but still not clear and sees shadows. Feels fatigued. Denies CP, SOB or edema. Compliant with meds. Still smoking 1/2-1ppd. Can do ADLs without problem   ICD: HL Logic Score 3 Volume ok. NO  VT/AF. AL 0.9 hr/day Personally reviewed   Cardiac Studies: Echo (8/24): EF 30%, + apical clot Echo (822): EF 35-40% RV ok. Echo 5/21: EF 20% with severe RV dysfunction.  RHC 5/21 Ao = 106/75 (87) LV = 107/27 RA = 15 RV = 54/16 PA = 57/25 (40) PCW = 27 (v = 37) Fick cardiac output/index = 4.4/2.0 PVR = 1.4 WU SVR = 1297 Ao sat = 99% PA sat = 62%, 63%  Past Medical History:  Diagnosis Date   AICD (automatic cardioverter/defibrillator) present    Anemia    Anxiety    Arthritis    Cataract    Mixed OU   CHF (congestive heart failure) (HCC)    Coronary artery disease    Detached retina, bilateral    Diabetes mellitus without complication (HCC)    type 2   Diabetic retinopathy (HCC)    PDR OU   Dizziness    Headache    Cluster headaches in the past   History of kidney stones    2 sitting   Hyperlipidemia    Hypertension    Hypertensive retinopathy    OU   Myocardial infarction (HCC)    Pneumonia    Syncope    Tobacco abuse    Current Outpatient Medications  Medication Sig Dispense Refill   albuterol  (VENTOLIN  HFA) 108 (90 Base) MCG/ACT inhaler Inhale 2 puffs into the lungs every 4 - 6 hours as needed for wheezing. 6.7 g 0   apixaban  (ELIQUIS ) 5 MG TABS  tablet Take 1 tablet (5 mg total) by mouth 2 (two) times daily. 60 tablet 11   carvedilol  (COREG ) 3.125 MG tablet Take 1 tablet (3.125 mg total) by mouth 2 (two) times daily. 180 tablet 2   dapagliflozin  propanediol (FARXIGA ) 10 MG TABS tablet Take 1 tablet (10 mg total) by mouth daily. 90 tablet 3   diclofenac  Sodium (GOODSENSE ARTHRITIS PAIN) 1 % GEL Apply 2 g topically 4 (four) times daily for 7 days to single elbow, wrist or hand; for hand includes palm/fingers/back of hand. 100 g 0   Dulaglutide  (TRULICITY ) 3 MG/0.5ML SOAJ Inject 3 mg into the skin once a week. 6 mL 3   Evolocumab  (REPATHA  SURECLICK) 140 MG/ML SOAJ Inject 140 mg into the skin every 14 (fourteen) days. 6 mL 3   fluticasone  (FLONASE ) 50  MCG/ACT nasal spray Place 2 sprays into both nostrils daily. 16 g 6   furosemide  (LASIX ) 20 MG tablet Take 1 tablet (20 mg total) by mouth 3 (three) times a week. 30 tablet 3   losartan  (COZAAR ) 25 MG tablet Take 1 tablet (25 mg total) by mouth at bedtime. 90 tablet 3   Melatonin 10 MG TABS Take 10 mg by mouth at bedtime as needed (sleep.).     Multiple Vitamins-Minerals (MULTIVITAMINS THER. W/MINERALS) TABS tablet Take 1 tablet by mouth daily.     ondansetron  (ZOFRAN -ODT) 4 MG disintegrating tablet Dissolve 1 tablet (4 mg total) by mouth every 8 (eight) hours as needed for nausea or vomiting. 20 tablet 0   Phenylephrine -Acetaminophen  (SINUS CONGESTION/PAIN PO) Take 2 tablets by mouth every 6 (six) hours as needed (sinus).     potassium chloride  SA (KLOR-CON  M) 20 MEQ tablet Take 2 tablets (40 mEq total) by mouth daily for 3 days, THEN 1 tablet (20 mEq total) 3 (three) times a week. 20 tablet 3   spironolactone  (ALDACTONE ) 25 MG tablet Take 1 tablet (25 mg total) by mouth daily. 90 tablet 3   acetaZOLAMIDE  ER (DIAMOX ) 500 MG capsule Take 1 capsule (500 mg total) by mouth in the morning and in the evening. (Patient not taking: Reported on 10/08/2023) 60 capsule 1   nicotine  (NICODERM CQ  - DOSED IN MG/24 HR) 7 mg/24hr patch Place 1 patch (7 mg total) onto the skin daily. (Patient not taking: Reported on 03/03/2024) 28 patch 5   No current facility-administered medications for this encounter.   Allergies  Allergen Reactions   Crestor  [Rosuvastatin ] Diarrhea   Lipitor [Atorvastatin ] Diarrhea    Social History   Socioeconomic History   Marital status: Married    Spouse name: Not on file   Number of children: Not on file   Years of education: Not on file   Highest education level: Associate degree: occupational, Scientist, product/process development, or vocational program  Occupational History   Not on file  Tobacco Use   Smoking status: Every Day    Current packs/day: 0.50    Average packs/day: 0.5 packs/day for 37.7  years (18.9 ttl pk-yrs)    Types: Cigarettes    Start date: 1988    Passive exposure: Current   Smokeless tobacco: Never  Vaping Use   Vaping status: Never Used  Substance and Sexual Activity   Alcohol use: Not Currently   Drug use: Never   Sexual activity: Not on file  Other Topics Concern   Not on file  Social History Narrative   Not on file   Social Drivers of Health   Financial Resource Strain: Medium Risk (09/23/2023)  Overall Financial Resource Strain (CARDIA)    Difficulty of Paying Living Expenses: Somewhat hard  Food Insecurity: Food Insecurity Present (09/23/2023)   Hunger Vital Sign    Worried About Running Out of Food in the Last Year: Sometimes true    Ran Out of Food in the Last Year: Never true  Transportation Needs: Unmet Transportation Needs (09/23/2023)   PRAPARE - Administrator, Civil Service (Medical): Yes    Lack of Transportation (Non-Medical): Yes  Physical Activity: Unknown (09/23/2023)   Exercise Vital Sign    Days of Exercise per Week: 0 days    Minutes of Exercise per Session: Not on file  Stress: Stress Concern Present (09/23/2023)   [employer] of Occupational Health - Occupational Stress Questionnaire    Feeling of Stress : To some extent  Social Connections: Socially Integrated (09/23/2023)   Social Connection and Isolation Panel    Frequency of Communication with Friends and Family: More than three times a week    Frequency of Social Gatherings with Friends and Family: Three times a week    Attends Religious Services: More than 4 times per year    Active Member of Clubs or Organizations: Yes    Attends Banker Meetings: More than 4 times per year    Marital Status: Married  Catering manager Violence: Not At Risk (08/13/2023)   Humiliation, Afraid, Rape, and Kick questionnaire    Fear of Current or Ex-Partner: No    Emotionally Abused: No    Physically Abused: No    Sexually Abused: No   Family History  Problem  Relation Age of Onset   Sudden Cardiac Death Mother    CAD Father    CAD Paternal Grandmother    CAD Paternal Grandfather     Wt Readings from Last 3 Encounters:  03/03/24 87.6 kg (193 lb 3.2 oz)  10/08/23 84.4 kg (186 lb 1.6 oz)  09/28/23 86 kg (189 lb 9.6 oz)   BP 110/70   Pulse 78   Wt 87.6 kg (193 lb 3.2 oz)   LMP  (LMP Unknown)   SpO2 98%   BMI 29.38 kg/m   PHYSICAL EXAM: General:  NAD. No resp difficulty, arrived in [facility] HEENT: + legally blind Neck: Supple. No JVD. Cor: Regular rate & rhythm. No rubs, gallops or murmurs. Lungs: Clear Abdomen: Soft, nontender, nondistended.  Extremities: No cyanosis, clubbing, rash, edema Neuro: Alert & oriented x 3, moves all 4 extremities w/o difficulty. Affect pleasant.  ICD interrogation (personally reviewed from 08/13/23): HL score 21, thoracic imedence ok, average HR 74   ASSESSMENT & PLAN: 1. Syncope, recurrent - Suspect autonomic dysfunction in setting of advanced DM2.  - Zio 4 day (10/22): showed mostly SR, no high grade arrhythmia - Now with ICD (placed 6/23)  - ICD followed by Dr. Waddell. - ICD: HL Logic Score 3 Volume ok. NO VT/AF. AL 0.9 hr/day Personally reviewed  2. 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 R/LHC showed diffuse non-obstructive CAD with chronic dissection/recannalization of mid LAD. Suspect she had an out of hospital MI at some point.  There were no interventional targets. - cMRI 8/21: LVEF 30% prior LAD & RCA infarcts. RV ok  - Echo 01/15/21:  EF 35-40% RV ok  - Echo 3/23 EF 20-25% RV ok  - S/p ICD 06/23 - ICD: HL Logic Score 3 Volume ok. No VT/AF. AL 0.9 hr/day Personally reviewed - Echo  8/24 EF ~ 30% + apical clot (read as EF 20-25%) - Echo EF 9/25 EF 25-30% - s/p Barostim - Stable NYHA II-III volume ok  - Continue Lasix  20 mg 3x/week - Continue losartan  25 mg qhs. (Failed Entresto  due to low BP) - Continue spiro 25 mg daily. - Continue Coreg  3.125  mg bid. - Continue Farxiga  10 mg daily - BP too soft to titrate - Wants to stop kcl. Will hold for now. Recheck labs in 1 week  - Encouraged her to be more active   3. CAD - LHC 5/21 showed diffuse non-obstructive CAD with chronic dissection/recannalization of mid LAD. Suspect she had an out of hospital MI at some point.  There were no interventional targets. - cMRI as above c/w prior infarcts in LAD/RCA territories - No s/s angina - Continue ASA, Farxiga  & Trulicity  - Off Crestor  and atorva with GI upset - Followed by Lipid Clinic - on Repatha  Lipids improving - Still smoking. Needs complete smoking cessation  4. LV apical clot - Continue Eliquis  5 mg bid. Occasioanl epistaxis. Stable  - CBC and iron studies today.  5. Type 2 DM with diabetic retinopathy  - Management per PCP. - Continue Farxiga  & Trulicity .  6. Tobacco Abuse - Still smoking 1/2-1ppd  - Continue cessation efforts  7. HLD  - Failed statins. - LDL 147 on labs 07/02/23, but was off Repatha  at that time, now back on as of 06/2023 - Follows with Lipid Clinic - Will repeat labs next week  8. Snoring/fatigue - No OSA on PSG, AHI 1.2/hr   Toribio Fuel, MD 03/03/24

## 2024-03-03 NOTE — Addendum Note (Signed)
 Encounter addended by: Tita Andriette NOVAK, RN on: 03/03/2024 11:30 AM  Actions taken: Clinical Note Signed, Medication long-term status modified, Order list changed, Diagnosis association updated

## 2024-03-10 ENCOUNTER — Ambulatory Visit (HOSPITAL_COMMUNITY)
Admission: RE | Admit: 2024-03-10 | Discharge: 2024-03-10 | Disposition: A | Discharge status: Home / Self Care | Source: Ambulatory Visit | Attending: Cardiology | Admitting: Cardiology

## 2024-03-10 DIAGNOSIS — I5022 Chronic systolic (congestive) heart failure: Secondary | ICD-10-CM | POA: Diagnosis not present

## 2024-03-10 LAB — COMPREHENSIVE METABOLIC PANEL WITH GFR
ALT: 13 U/L (ref 0–44)
AST: 14 U/L — ABNORMAL LOW (ref 15–41)
Albumin: 3.8 g/dL (ref 3.5–5.0)
Alkaline Phosphatase: 76 U/L (ref 38–126)
Anion gap: 11 (ref 5–15)
BUN: 15 mg/dL (ref 6–20)
CO2: 26 mmol/L (ref 22–32)
Calcium: 9.1 mg/dL (ref 8.9–10.3)
Chloride: 99 mmol/L (ref 98–111)
Creatinine, Ser: 1 mg/dL (ref 0.44–1.00)
GFR, Estimated: >60 mL/min (ref >60)
Glucose, Bld: 166 mg/dL — ABNORMAL HIGH (ref 70–99)
Potassium: 3.7 mmol/L (ref 3.5–5.1)
Sodium: 136 mmol/L (ref 135–145)
Total Bilirubin: 0.7 mg/dL (ref 0.0–1.2)
Total Protein: 6.6 g/dL (ref 6.5–8.1)

## 2024-03-10 LAB — LIPID PANEL
Cholesterol: 136 mg/dL (ref 0–200)
HDL: 45 mg/dL (ref >40)
LDL Cholesterol: 55 mg/dL (ref 0–99)
Total CHOL/HDL Ratio: 3 ratio
Triglycerides: 178 mg/dL — ABNORMAL HIGH (ref <150)
VLDL: 36 mg/dL (ref 0–40)

## 2024-03-10 LAB — BRAIN NATRIURETIC PEPTIDE: B Natriuretic Peptide: 98.7 pg/mL (ref 0.0–100.0)

## 2024-03-14 ENCOUNTER — Encounter: Payer: Self-pay | Admitting: Internal Medicine

## 2024-03-16 ENCOUNTER — Encounter (HOSPITAL_BASED_OUTPATIENT_CLINIC_OR_DEPARTMENT_OTHER): Payer: Self-pay

## 2024-03-16 ENCOUNTER — Ambulatory Visit (INDEPENDENT_AMBULATORY_CARE_PROVIDER_SITE_OTHER)

## 2024-03-16 VITALS — BP 110/70 | Ht 68.0 in | Wt 195.0 lb

## 2024-03-16 DIAGNOSIS — Z0000 Encounter for general adult medical examination without abnormal findings: Secondary | ICD-10-CM | POA: Diagnosis not present

## 2024-03-16 NOTE — Patient Instructions (Signed)
 Shelly [name],  Thank you for taking the time for your Medicare Wellness Visit. I appreciate your continued commitment to your health goals. Please review the care plan we discussed, and feel free to reach out if I can assist you further.  Medicare recommends these wellness visits once per year to help you and your care team stay ahead of potential health issues. These visits are designed to focus on prevention, allowing your provider to concentrate on managing your acute and chronic conditions during your regular appointments.  Please note that Annual Wellness Visits do not include a physical exam. Some assessments may be limited, especially if the visit was conducted virtually. If needed, we may recommend a separate in-person follow-up with your provider.  Ongoing Care Seeing your primary care provider every 3 to 6 months helps us  monitor your health and provide consistent, personalized care.   Referrals If a referral was made during today's visit and you haven't received any updates within two weeks, please contact the referred provider directly to check on the status.  Recommended Screenings:  Health Maintenance  Topic Date Due   Medicare Annual Wellness Visit  Never done   COVID-19 Vaccine (1) Never done   Eye exam for diabetics  Never done   Hepatitis C Screening  Never done   Pneumococcal Vaccine for age over 54 (1 of 2 - PCV) Never done   Hepatitis B Vaccine (1 of 3 - 19+ 3-dose series) Never done   Zoster (Shingles) Vaccine (1 of 2) Never done   Pap with HPV screening  Never done   Hemoglobin A1C  12/30/2023   Flu Shot  Never done   Colon Cancer Screening  07/15/2024*   Yearly kidney health urinalysis for diabetes  07/01/2024   Complete foot exam   10/07/2024   Yearly kidney function blood test for diabetes  03/10/2025   Breast Cancer Screening  10/07/2025   DTaP/Tdap/Td vaccine (2 - Td or Tdap) 06/09/2028   HIV Screening  Completed   HPV Vaccine  Aged Out   Meningitis B  Vaccine  Aged Out  *Topic was postponed. The date shown is not the original due date.       03/16/2024   11:42 AM  Advanced Directives  Does Patient Have a Medical Advance Directive? No  Would patient like information on creating a medical advance directive? No - Patient declined   Advance Care Planning is important because it: Ensures you receive medical care that aligns with your values, goals, and preferences. Provides guidance to your family and loved ones, reducing the emotional burden of decision-making during critical moments.  Vision: Annual vision screenings are recommended for early detection of glaucoma, cataracts, and diabetic retinopathy. These exams can also reveal signs of chronic conditions such as diabetes and high blood pressure.  Dental: Annual dental screenings help detect early signs of oral cancer, gum disease, and other conditions linked to overall health, including heart disease and diabetes.  Please see the attached documents for additional preventive care recommendations.

## 2024-03-16 NOTE — Progress Notes (Signed)
 Because this visit was a virtual/telehealth visit,  certain criteria was not obtained, such a blood pressure, CBG if applicable, and timed get up and go. Any medications not marked as taking were not mentioned during the medication reconciliation part of the visit. Any vitals not documented were not able to be obtained due to this being a telehealth visit or patient was unable to self-report a recent blood pressure reading due to a lack of equipment at home via telehealth. Vitals that have been documented are verbally provided by the patient.  This visit was performed by a medical professional under my direct supervision. I was immediately available for consultation/collaboration. I have reviewed and agree with the Annual Wellness Visit documentation.  Subjective:   Shelly [name] is a 53 y.o. who presents for a Medicare Wellness preventive visit.  As a reminder, Annual Wellness Visits don't include a physical exam, and some assessments may be limited, especially if this visit is performed virtually. We may recommend an in-person follow-up visit with your provider if needed.  Visit Complete: Virtual I connected with  Shelly [name] on 03/16/24 by a audio enabled telemedicine application and verified that I am speaking with the correct person using two identifiers.  Patient Location: Home  Provider Location: Home Office  I discussed the limitations of evaluation and management by telemedicine. The patient expressed understanding and agreed to proceed.  Vital Signs: Because this visit was a virtual/telehealth visit, some criteria may be missing or patient reported. Any vitals not documented were not able to be obtained and vitals that have been documented are patient reported.  VideoDeclined- This patient declined Librarian, academic. Therefore the visit was completed with audio only.  Persons Participating in Visit: Patient.  AWV Questionnaire: No: Patient  Medicare AWV questionnaire was not completed prior to this visit.  Cardiac Risk Factors include: advanced age (>61men, >41 women);dyslipidemia     Objective:    Today's Vitals   03/16/24 1143  BP: 110/70  Weight: 195 lb (88.5 kg)  Height: 5' 8 (1.727 m)   Body mass index is 29.65 kg/m.     03/16/2024   11:42 AM 04/12/2023    8:00 PM 01/30/2023    6:05 AM 11/01/2022    8:25 PM 11/21/2021   10:05 AM 08/11/2021    9:27 PM 02/20/2021    5:45 PM  Advanced Directives  Does Patient Have a Medical Advance Directive? No No No No No No No  Would patient like information on creating a medical advance directive? No - Patient declined No - Patient declined No - Patient declined No - Patient declined No - Patient declined      Current Medications (verified) Outpatient Encounter Medications as of 03/16/2024  Medication Sig   albuterol  (VENTOLIN  HFA) 108 (90 Base) MCG/ACT inhaler Inhale 2 puffs into the lungs every 4 - 6 hours as needed for wheezing.   apixaban  (ELIQUIS ) 5 MG TABS tablet Take 1 tablet (5 mg total) by mouth 2 (two) times daily.   carvedilol  (COREG ) 3.125 MG tablet Take 1 tablet (3.125 mg total) by mouth 2 (two) times daily.   dapagliflozin  propanediol (FARXIGA ) 10 MG TABS tablet Take 1 tablet (10 mg total) by mouth daily.   diclofenac  Sodium (GOODSENSE ARTHRITIS PAIN) 1 % GEL Apply 2 g topically 4 (four) times daily for 7 days to single elbow, wrist or hand; for hand includes palm/fingers/back of hand.   Dulaglutide  (TRULICITY ) 3 MG/0.5ML SOAJ Inject 3 mg into the skin  once a week.   Evolocumab  (REPATHA  SURECLICK) 140 MG/ML SOAJ Inject 140 mg into the skin every 14 (fourteen) days.   fluticasone  (FLONASE ) 50 MCG/ACT nasal spray Place 2 sprays into both nostrils daily.   furosemide  (LASIX ) 20 MG tablet Take 1 tablet (20 mg total) by mouth 3 (three) times a week.   losartan  (COZAAR ) 25 MG tablet Take 1 tablet (25 mg total) by mouth at bedtime.   Melatonin 10 MG TABS Take 10 mg by  mouth at bedtime as needed (sleep.).   Multiple Vitamins-Minerals (MULTIVITAMINS THER. W/MINERALS) TABS tablet Take 1 tablet by mouth daily.   ondansetron  (ZOFRAN -ODT) 4 MG disintegrating tablet Dissolve 1 tablet (4 mg total) by mouth every 8 (eight) hours as needed for nausea or vomiting.   Phenylephrine -Acetaminophen  (SINUS CONGESTION/PAIN PO) Take 2 tablets by mouth every 6 (six) hours as needed (sinus).   spironolactone  (ALDACTONE ) 25 MG tablet Take 1 tablet (25 mg total) by mouth daily.   acetaZOLAMIDE  ER (DIAMOX ) 500 MG capsule Take 1 capsule (500 mg total) by mouth in the morning and in the evening. (Patient not taking: Reported on 03/16/2024)   nicotine  (NICODERM CQ  - DOSED IN MG/24 HR) 7 mg/24hr patch Place 1 patch (7 mg total) onto the skin daily. (Patient not taking: Reported on 03/16/2024)   No facility-administered encounter medications on file as of 03/16/2024.    Allergies (verified) Crestor  [rosuvastatin ] and Lipitor [atorvastatin ]   History: Past Medical History:  Diagnosis Date   AICD (automatic cardioverter/defibrillator) present    Anemia    Anxiety    Arthritis    Cataract    Mixed OU   CHF (congestive heart failure) (HCC)    Coronary artery disease    Detached retina, bilateral    Diabetes mellitus without complication (HCC)    type 2   Diabetic retinopathy (HCC)    PDR OU   Dizziness    Headache    Cluster headaches in the past   History of kidney stones    2 sitting   Hyperlipidemia    Hypertension    Hypertensive retinopathy    OU   Myocardial infarction (HCC)    Pneumonia    Syncope    Tobacco abuse    Past Surgical History:  Procedure Laterality Date   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 Redell Hans   ICD IMPLANT N/A 11/21/2021   Procedure: ICD IMPLANT;  Surgeon: Waddell Danelle ORN, MD;  Location:  [facility];  Service: Cardiovascular;  Laterality: N/A;   INJECTION OF SILICONE OIL Left 08/23/2020   Procedure: INJECTION OF SILICONE OIL;  Surgeon: Hans Redell, MD;  Location: [facility] OR;  Service: Ophthalmology;  Laterality: Left;   INJECTION OF SILICONE OIL Right 10/04/2020   Procedure: INJECTION OF SILICONE OIL;  Surgeon: Hans Redell, MD;  Location: [facility] OR;  Service: Ophthalmology;  Laterality: Right;   MEMBRANE PEEL Left 08/23/2020   Procedure: MEMBRANE PEEL;  Surgeon: Hans Redell, MD;  Location: [facility] OR;  Service: Ophthalmology;  Laterality: Left;   MEMBRANE PEEL Right 10/04/2020   Procedure: MEMBRANE PEEL;  Surgeon: Hans Redell, MD;  Location: [facility] OR;  Service: Ophthalmology;  Laterality: Right;   PARS PLANA VITRECTOMY Left 08/23/2020   Procedure: PARS PLANA VITRECTOMY WITH 25 GAUGE WITH ENDOLASER;  Surgeon: Hans Redell, MD;  Location: [facility] OR;  Service: Ophthalmology;  Laterality: Left;   PARS PLANA VITRECTOMY Right 10/04/2020   Procedure: PARS PLANA VITRECTOMY WITH 25 GAUGE, INJECTION OF AVASTIN ;  Surgeon: Valdemar Rogue, MD;  Location: [facility] OR;  Service: Ophthalmology;  Laterality: Right;   PHOTOCOAGULATION WITH LASER Right 10/04/2020   Procedure: PHOTOCOAGULATION WITH LASER;  Surgeon: Valdemar Rogue, MD;  Location: [facility] OR;  Service: Ophthalmology;  Laterality: Right;   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/23/2020   Procedure: REPAIR OF COMPLEX TRACTION RETINAL DETACHMENT;  Surgeon: Valdemar Rogue, MD;  Location: [facility] OR;  Service: Ophthalmology;  Laterality: Left;   RIGHT/LEFT HEART CATH AND CORONARY ANGIOGRAPHY N/A 10/14/2019   Procedure: RIGHT/LEFT HEART CATH AND CORONARY ANGIOGRAPHY;  Surgeon: Cherrie Toribio SAUNDERS, MD;  Location: [facility];  Service: Cardiovascular;  Laterality: N/A;   TUBAL LIGATION     Family History  Problem Relation Age of Onset   Sudden Cardiac Death Mother    CAD Father    CAD Paternal Grandmother    CAD Paternal Grandfather     Social History   Socioeconomic History   Marital status: Married    Spouse name: Not on file   Number of children: Not on file   Years of education: Not on file   Highest education level: Associate degree: occupational, Scientist, product/process development, or vocational program  Occupational History   Not on file  Tobacco Use   Smoking status: Every Day    Current packs/day: 0.50    Average packs/day: 0.5 packs/day for 37.8 years (18.9 ttl pk-yrs)    Types: Cigarettes    Start date: 1988    Passive exposure: Current   Smokeless tobacco: Never  Vaping Use   Vaping status: Never Used  Substance and Sexual Activity   Alcohol use: Not Currently   Drug use: Never   Sexual activity: Not on file  Other Topics Concern   Not on file  Social History Narrative   Not on file   Social Drivers of Health   Financial Resource Strain: Medium Risk (03/16/2024)   Overall Financial Resource Strain (CARDIA)    Difficulty of Paying Living Expenses: Somewhat hard  Food Insecurity: No Food Insecurity (03/16/2024)   Hunger Vital Sign    Worried About Running Out of Food in the Last Year: Never true    Ran Out of Food in the Last Year: Never true  Transportation Needs: Unmet Transportation Needs (03/16/2024)   PRAPARE - Transportation    Lack of Transportation (Medical): Yes    Lack of Transportation (Non-Medical): Yes  Physical Activity: Insufficiently Active (03/16/2024)   Exercise Vital Sign    Days of Exercise per Week: 4 days    Minutes of Exercise per Session: 30 min  Stress: Stress Concern Present (03/16/2024)   [employer] of Occupational Health - Occupational Stress Questionnaire    Feeling of Stress: To some extent  Social Connections: Socially Integrated (03/16/2024)   Social Connection and Isolation Panel    Frequency of Communication with Friends and Family: More than three times a week    Frequency of Social Gatherings with Friends and Family: Three times a week    Attends Religious Services: More  than 4 times per year    Active Member of Clubs or Organizations: Yes    Attends Banker Meetings: More than 4 times per year    Marital Status: Married    Tobacco Counseling Ready to quit: Not Answered Counseling given: Not Answered    Clinical Intake:  Pre-visit  preparation completed: Yes  Pain : No/denies pain     BMI - recorded: 29.65 Nutritional Status: BMI 25 -29 Overweight Nutritional Risks: None Diabetes: Yes CBG done?: No Did pt. bring in CBG monitor from home?: No  Lab Results  Component Value Date   HGBA1C 7.2 (H) 07/02/2023     How often do you need to have someone help you when you read instructions, pamphlets, or other written materials from your doctor or pharmacy?: 1 - Never  Interpreter Needed?: No  Information entered by :: Skylah Delauter,CMA   Activities of Daily Living     03/16/2024   11:46 AM  In your present state of health, do you have any difficulty performing the following activities:  Hearing? 0  Vision? 1  Comment legally blind  Difficulty concentrating or making decisions? 0  Walking or climbing stairs? 0  Dressing or bathing? 0  Doing errands, shopping? 0  Preparing Food and eating ? Y  Using the Toilet? N  In the past six months, have you accidently leaked urine? Y  Do you have problems with loss of bowel control? N  Managing your Medications? Y  Managing your Finances? Y  Housekeeping or managing your Housekeeping? Y    Patient Care Team: Rothfuss, Jacob T, PA-C as PCP - General (Physician Assistant) Waddell Danelle ORN, MD as PCP - Electrophysiology (Cardiology)  I have updated your Care Teams any recent Medical Services you may have received from other providers in the past year.     Assessment:   This is a routine wellness examination for [place].  Hearing/Vision screen Hearing Screening - Comments:: No difficulties Vision Screening - Comments:: Legally blind    Goals Addressed             This  Visit's Progress    Patient Stated       To get A1c down       Depression Screen     03/16/2024   11:48 AM 10/08/2023   10:26 AM 07/02/2023   10:00 AM  PHQ 2/9 Scores  PHQ - 2 Score 2 2 6   PHQ- 9 Score 4 15 20     Fall Risk     03/16/2024   11:46 AM 05/21/2023   11:04 AM  Fall Risk   Falls in the past year? 1   Number falls in past yr: 0 1  Injury with Fall? 0 0  Risk for fall due to :  History of fall(s);Impaired vision  Follow up Falls evaluation completed Falls evaluation completed;Follow up appointment    MEDICARE RISK AT HOME:  Medicare Risk at Home Any stairs in or around the home?: No If so, are there any without handrails?: No Home free of loose throw rugs in walkways, pet beds, electrical cords, etc?: Yes Adequate lighting in your home to reduce risk of falls?: Yes Life alert?: No Use of a cane, walker or w/c?: No Grab bars in the bathroom?: No Shower chair or bench in shower?: Yes Elevated toilet seat or a handicapped toilet?: No  TIMED UP AND GO:  Was the test performed?  No  Cognitive Function: 6CIT completed        03/16/2024   11:50 AM  6CIT Screen  What Year? 0 points  What month? 0 points  What time? 0 points  Count back from 20 0 points  Months in reverse 0 points  Repeat phrase 0 points  Total Score 0 points    Immunizations Immunization History  Administered  Date(s) Administered   Tdap 06/09/2018    Screening Tests Health Maintenance  Topic Date Due   Medicare Annual Wellness (AWV)  Never done   COVID-19 Vaccine (1) Never done   OPHTHALMOLOGY EXAM  Never done   Hepatitis C Screening  Never done   Pneumococcal Vaccine: 50+ Years (1 of 2 - PCV) Never done   Hepatitis B Vaccines 19-59 Average Risk (1 of 3 - 19+ 3-dose series) Never done   Zoster Vaccines- Shingrix (1 of 2) Never done   Cervical Cancer Screening (HPV/Pap Cotest)  Never done   HEMOGLOBIN A1C  12/30/2023   Influenza Vaccine  Never done   Colonoscopy  07/15/2024  (Originally 09/11/2014)   Diabetic kidney evaluation - Urine ACR  07/01/2024   FOOT EXAM  10/07/2024   Diabetic kidney evaluation - eGFR measurement  03/10/2025   Mammogram  10/07/2025   DTaP/Tdap/Td (2 - Td or Tdap) 06/09/2028   HIV Screening  Completed   HPV VACCINES  Aged Out   Meningococcal B Vaccine  Aged Out    Health Maintenance Items Addressed:addressed with patient but declined at the moment   Additional Screening:  Vision Screening: Recommended annual ophthalmology exams for early detection of glaucoma and other disorders of the eye. Is the patient up to date with their annual eye exam?  No  Who is the provider or what is the name of the office in which the patient attends annual eye exams?   Dental Screening: Recommended annual dental exams for proper oral hygiene  Community Resource Referral / Chronic Care Management: CRR required this visit?  No   CCM required this visit?  No   Plan:    I have personally reviewed and noted the following in the patient's chart:   Medical and social history Use of alcohol, tobacco or illicit drugs  Current medications and supplements including opioid prescriptions. Patient is not currently taking opioid prescriptions. Functional ability and status Nutritional status Physical activity Advanced directives List of other physicians Hospitalizations, surgeries, and ER visits in previous 12 months Vitals Screenings to include cognitive, depression, and falls Referrals and appointments  In addition, I have reviewed and discussed with patient certain preventive protocols, quality metrics, and best practice recommendations. A written personalized care plan for preventive services as well as general preventive health recommendations were provided to patient.   Lyle MARLA Right, [place]   03/16/2024   After Visit Summary: (MyChart) Due to this being a telephonic visit, the after visit summary with patients personalized plan was offered to  patient via MyChart   Notes: Nothing significant to report at this time.

## 2024-03-22 ENCOUNTER — Other Ambulatory Visit (HOSPITAL_COMMUNITY): Payer: Self-pay

## 2024-04-04 ENCOUNTER — Other Ambulatory Visit (HOSPITAL_COMMUNITY): Payer: Self-pay

## 2024-04-07 ENCOUNTER — Ambulatory Visit (INDEPENDENT_AMBULATORY_CARE_PROVIDER_SITE_OTHER): Admitting: Student

## 2024-04-07 ENCOUNTER — Encounter (HOSPITAL_BASED_OUTPATIENT_CLINIC_OR_DEPARTMENT_OTHER): Payer: Self-pay | Admitting: Student

## 2024-04-07 ENCOUNTER — Other Ambulatory Visit (HOSPITAL_BASED_OUTPATIENT_CLINIC_OR_DEPARTMENT_OTHER): Payer: Self-pay

## 2024-04-07 VITALS — BP 117/82 | HR 80 | Temp 98.0°F | Resp 18 | Ht 68.0 in | Wt 193.0 lb

## 2024-04-07 DIAGNOSIS — E113523 Type 2 diabetes mellitus with proliferative diabetic retinopathy with traction retinal detachment involving the macula, bilateral: Secondary | ICD-10-CM

## 2024-04-07 DIAGNOSIS — I5020 Unspecified systolic (congestive) heart failure: Secondary | ICD-10-CM

## 2024-04-07 DIAGNOSIS — F17200 Nicotine dependence, unspecified, uncomplicated: Secondary | ICD-10-CM

## 2024-04-07 DIAGNOSIS — J0110 Acute frontal sinusitis, unspecified: Secondary | ICD-10-CM | POA: Diagnosis not present

## 2024-04-07 DIAGNOSIS — R110 Nausea: Secondary | ICD-10-CM

## 2024-04-07 DIAGNOSIS — Z7985 Long-term (current) use of injectable non-insulin antidiabetic drugs: Secondary | ICD-10-CM

## 2024-04-07 DIAGNOSIS — H66001 Acute suppurative otitis media without spontaneous rupture of ear drum, right ear: Secondary | ICD-10-CM | POA: Diagnosis not present

## 2024-04-07 DIAGNOSIS — M62838 Other muscle spasm: Secondary | ICD-10-CM | POA: Diagnosis not present

## 2024-04-07 DIAGNOSIS — E782 Mixed hyperlipidemia: Secondary | ICD-10-CM

## 2024-04-07 LAB — HEMOGLOBIN A1C
Est. average glucose Bld gHb Est-mCnc: 134 mg/dL
Hgb A1c MFr Bld: 6.3 % — ABNORMAL HIGH (ref 4.8–5.6)

## 2024-04-07 MED ORDER — CYCLOBENZAPRINE HCL 5 MG PO TABS
5.0000 mg | ORAL_TABLET | Freq: Every day | ORAL | 0 refills | Status: AC
  Filled 2024-04-07: qty 30, 30d supply, fill #0

## 2024-04-07 MED ORDER — AMOXICILLIN-POT CLAVULANATE 875-125 MG PO TABS
1.0000 | ORAL_TABLET | Freq: Two times a day (BID) | ORAL | 0 refills | Status: AC
  Filled 2024-04-07: qty 14, 7d supply, fill #0

## 2024-04-07 MED ORDER — ONDANSETRON 4 MG PO TBDP
4.0000 mg | ORAL_TABLET | Freq: Three times a day (TID) | ORAL | 0 refills | Status: AC | PRN
  Filled 2024-04-07: qty 30, 10d supply, fill #0
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
## 2024-04-07 NOTE — Patient Instructions (Signed)
 It was nice to see you today!  As we discussed in clinic:  1-800-QUITNOW.  If you have any problems before your next visit feel free to message me via MyChart (minor issues or questions) or call the office, otherwise you may reach out to schedule an office visit.  Thank you! Rafan Sanders, PA-C

## 2024-04-08 ENCOUNTER — Ambulatory Visit (HOSPITAL_BASED_OUTPATIENT_CLINIC_OR_DEPARTMENT_OTHER): Payer: Self-pay | Admitting: Student

## 2024-04-21 ENCOUNTER — Other Ambulatory Visit (HOSPITAL_COMMUNITY): Payer: Self-pay

## 2024-04-25 ENCOUNTER — Encounter (HOSPITAL_BASED_OUTPATIENT_CLINIC_OR_DEPARTMENT_OTHER): Payer: Self-pay | Admitting: Student

## 2024-04-26 ENCOUNTER — Telehealth (HOSPITAL_BASED_OUTPATIENT_CLINIC_OR_DEPARTMENT_OTHER): Payer: Self-pay

## 2024-04-26 NOTE — Telephone Encounter (Signed)
 Per OV dated 04/07/2024, PCP offered trigger point injections for pt. Pt had declined. She would like to do this now. Said flexeril  is not helping at all. I asked if she wanted to try tizanidine but she said anything that will help. Cannot turn her head. Also wants her xray ordered.

## 2024-04-27 ENCOUNTER — Other Ambulatory Visit (HOSPITAL_BASED_OUTPATIENT_CLINIC_OR_DEPARTMENT_OTHER): Payer: Self-pay | Admitting: Student

## 2024-04-27 DIAGNOSIS — G8929 Other chronic pain: Secondary | ICD-10-CM

## 2024-04-28 ENCOUNTER — Other Ambulatory Visit (HOSPITAL_COMMUNITY): Payer: Self-pay

## 2024-04-28 ENCOUNTER — Other Ambulatory Visit (HOSPITAL_BASED_OUTPATIENT_CLINIC_OR_DEPARTMENT_OTHER): Payer: Self-pay | Admitting: Student

## 2024-04-28 ENCOUNTER — Ambulatory Visit (INDEPENDENT_AMBULATORY_CARE_PROVIDER_SITE_OTHER)
Admission: RE | Admit: 2024-04-28 | Discharge: 2024-04-28 | Disposition: A | Discharge status: Home / Self Care | Source: Ambulatory Visit | Attending: Student | Admitting: Student

## 2024-04-28 DIAGNOSIS — M542 Cervicalgia: Secondary | ICD-10-CM

## 2024-04-28 DIAGNOSIS — J3489 Other specified disorders of nose and nasal sinuses: Secondary | ICD-10-CM

## 2024-04-28 DIAGNOSIS — G8929 Other chronic pain: Secondary | ICD-10-CM | POA: Diagnosis not present

## 2024-04-28 MED ORDER — FLUTICASONE PROPIONATE 50 MCG/ACT NA SUSP
2.0000 | Freq: Every day | NASAL | 6 refills | Status: AC
  Filled 2024-04-28: qty 16, 30d supply, fill #0
  Filled 2024-05-30: qty 16, 30d supply, fill #1
  Filled 2024-06-28: qty 16, 30d supply, fill #2

## 2024-05-04 ENCOUNTER — Other Ambulatory Visit (HOSPITAL_COMMUNITY): Payer: Self-pay

## 2024-05-04 ENCOUNTER — Telehealth (HOSPITAL_BASED_OUTPATIENT_CLINIC_OR_DEPARTMENT_OTHER): Payer: Self-pay

## 2024-05-04 ENCOUNTER — Other Ambulatory Visit (HOSPITAL_BASED_OUTPATIENT_CLINIC_OR_DEPARTMENT_OTHER): Payer: Self-pay | Admitting: Student

## 2024-05-04 DIAGNOSIS — G8929 Other chronic pain: Secondary | ICD-10-CM

## 2024-05-04 MED ORDER — TIZANIDINE HCL 4 MG PO TABS
4.0000 mg | ORAL_TABLET | Freq: Four times a day (QID) | ORAL | 0 refills | Status: AC | PRN
  Filled 2024-05-04: qty 30, 8d supply, fill #0

## 2024-05-04 NOTE — Telephone Encounter (Signed)
 Pt said she cannot get to PT appointments. Cannot drive. Had x-ray on 04/28/2024.I advised we did not have results back yet.

## 2024-05-04 NOTE — Telephone Encounter (Signed)
Pt advised and voices understanding.

## 2024-05-09 ENCOUNTER — Ambulatory Visit (HOSPITAL_BASED_OUTPATIENT_CLINIC_OR_DEPARTMENT_OTHER): Payer: Self-pay | Admitting: Student

## 2024-05-17 ENCOUNTER — Telehealth (HOSPITAL_BASED_OUTPATIENT_CLINIC_OR_DEPARTMENT_OTHER): Payer: Self-pay

## 2024-05-17 NOTE — Telephone Encounter (Signed)
 Pt stated it was her left shoulder that was bothering her. Not her right shoulder. Is going to wait after the holidays to see how she feels. Declining sports medicine for now.

## 2024-05-20 ENCOUNTER — Ambulatory Visit: Payer: Commercial Managed Care - HMO

## 2024-05-20 DIAGNOSIS — I5022 Chronic systolic (congestive) heart failure: Secondary | ICD-10-CM

## 2024-05-21 LAB — CUP PACEART REMOTE DEVICE CHECK
Battery Remaining Longevity: 156 mo
Battery Remaining Percentage: 100 %
Brady Statistic RV Percent Paced: 0 %
Date Time Interrogation Session: 20251212002100
HighPow Impedance: 76 Ohm
Implantable Lead Connection Status: 753985
Implantable Lead Implant Date: 20230615
Implantable Lead Location: 753860
Implantable Lead Model: 137
Implantable Lead Serial Number: 301168
Implantable Pulse Generator Implant Date: 20230615
Lead Channel Impedance Value: 485 Ohm
Lead Channel Pacing Threshold Amplitude: 0.9 V
Lead Channel Pacing Threshold Pulse Width: 0.4 ms
Lead Channel Setting Pacing Amplitude: 2.5 V
Lead Channel Setting Pacing Pulse Width: 0.4 ms
Lead Channel Setting Sensing Sensitivity: 0.5 mV
Pulse Gen Serial Number: 217479
Zone Setting Status: 755011

## 2024-05-22 ENCOUNTER — Ambulatory Visit: Payer: Self-pay | Admitting: Internal Medicine

## 2024-05-26 NOTE — Progress Notes (Signed)
 Remote ICD Transmission

## 2024-05-30 ENCOUNTER — Other Ambulatory Visit: Payer: Self-pay

## 2024-05-30 ENCOUNTER — Other Ambulatory Visit (HOSPITAL_COMMUNITY): Payer: Self-pay

## 2024-05-30 MED FILL — Dapagliflozin Propanediol Tab 10 MG (Base Equivalent): ORAL | 90 days supply | Qty: 90 | Fill #3 | Status: AC

## 2024-06-20 NOTE — Progress Notes (Signed)
 Shelly [name]                                          MRN: [id number]   06/20/2024   The [facility] Quality Team Specialist reviewed this patient medical record for the purposes of chart review for care gap closure. The following were reviewed: abstraction for care gap closure-glycemic status assessment.    VBCI Quality Team

## 2024-06-28 ENCOUNTER — Other Ambulatory Visit (HOSPITAL_COMMUNITY): Payer: Self-pay

## 2024-06-28 ENCOUNTER — Telehealth (HOSPITAL_COMMUNITY): Payer: Self-pay

## 2024-06-28 ENCOUNTER — Other Ambulatory Visit: Payer: Self-pay

## 2024-06-28 ENCOUNTER — Other Ambulatory Visit (HOSPITAL_COMMUNITY): Payer: Self-pay | Admitting: Internal Medicine

## 2024-06-28 MED ORDER — FUROSEMIDE 20 MG PO TABS
20.0000 mg | ORAL_TABLET | ORAL | 3 refills | Status: AC
  Filled 2024-06-28: qty 30, 70d supply, fill #0

## 2024-06-28 NOTE — Telephone Encounter (Signed)
 Advanced Heart Failure Patient Advocate Encounter  The patient was approved for a [employer] grant that will help cover the cost of Carvedilol , Entresto , Farxiga , Losartan , Spironolactone .  Total amount awarded, $7,500.  Effective: 05/29/2024 - 05/28/2025.  BIN [id number] PCN [id number] [employer] [id number] ID [id number]  Approval and processing information added to [facility]. Confirmed $0 copay for Eliquis . Patient informed via phone.  Rachel DEL, CPhT Rx Patient Advocate Phone: [phone]

## 2024-06-29 ENCOUNTER — Other Ambulatory Visit (HOSPITAL_COMMUNITY): Payer: Self-pay

## 2024-08-19 ENCOUNTER — Ambulatory Visit

## 2024-09-08 ENCOUNTER — Ambulatory Visit (HOSPITAL_BASED_OUTPATIENT_CLINIC_OR_DEPARTMENT_OTHER): Admitting: Student

## 2024-09-19 ENCOUNTER — Ambulatory Visit (HOSPITAL_BASED_OUTPATIENT_CLINIC_OR_DEPARTMENT_OTHER): Admitting: Student

## 2024-09-22 ENCOUNTER — Ambulatory Visit (HOSPITAL_COMMUNITY): Admitting: Internal Medicine

## 2024-11-18 ENCOUNTER — Ambulatory Visit

## 2025-02-17 ENCOUNTER — Ambulatory Visit

## 2025-05-19 ENCOUNTER — Ambulatory Visit
